# Patient Record
Sex: Female | Born: 1935
Health system: Southern US, Community
[De-identification: ages and names within clinical notes are randomized; demographics above are authoritative.]

## PROBLEM LIST (undated history)

## (undated) DIAGNOSIS — I110 Hypertensive heart disease with heart failure: Secondary | ICD-10-CM

## (undated) DIAGNOSIS — I4729 Other ventricular tachycardia: Secondary | ICD-10-CM

## (undated) DIAGNOSIS — I428 Other cardiomyopathies: Secondary | ICD-10-CM

## (undated) DIAGNOSIS — J45909 Unspecified asthma, uncomplicated: Secondary | ICD-10-CM

## (undated) DIAGNOSIS — I10 Essential (primary) hypertension: Secondary | ICD-10-CM

## (undated) DIAGNOSIS — E7800 Pure hypercholesterolemia, unspecified: Secondary | ICD-10-CM

## (undated) DIAGNOSIS — I472 Ventricular tachycardia: Secondary | ICD-10-CM

## (undated) DIAGNOSIS — M810 Age-related osteoporosis without current pathological fracture: Secondary | ICD-10-CM

## (undated) DIAGNOSIS — I5022 Chronic systolic (congestive) heart failure: Secondary | ICD-10-CM

## (undated) DIAGNOSIS — M1990 Unspecified osteoarthritis, unspecified site: Secondary | ICD-10-CM

## (undated) DIAGNOSIS — C189 Malignant neoplasm of colon, unspecified: Secondary | ICD-10-CM

## (undated) HISTORY — DX: Ventricular tachycardia: I47.2

## (undated) HISTORY — PX: ABDOMINAL HYSTERECTOMY: SHX81

## (undated) HISTORY — DX: Unspecified asthma, uncomplicated: J45.909

## (undated) HISTORY — DX: Age-related osteoporosis without current pathological fracture: M81.0

## (undated) HISTORY — DX: Essential (primary) hypertension: I10

## (undated) HISTORY — PX: BREAST BIOPSY: SHX20

## (undated) HISTORY — DX: Unspecified osteoarthritis, unspecified site: M19.90

## (undated) HISTORY — DX: Other ventricular tachycardia: I47.29

## (undated) HISTORY — PX: TUBAL LIGATION: SHX77

## (undated) HISTORY — DX: Pure hypercholesterolemia, unspecified: E78.00

---
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
## 2003-01-26 DIAGNOSIS — C189 Malignant neoplasm of colon, unspecified: Secondary | ICD-10-CM

## 2003-01-26 HISTORY — DX: Malignant neoplasm of colon, unspecified: C18.9

## 2003-11-29 ENCOUNTER — Ambulatory Visit: Payer: Self-pay

## 2003-12-03 ENCOUNTER — Ambulatory Visit: Payer: Self-pay | Admitting: Gastroenterology

## 2003-12-23 ENCOUNTER — Inpatient Hospital Stay: Payer: Self-pay | Admitting: Surgery

## 2004-01-13 ENCOUNTER — Ambulatory Visit: Payer: Self-pay

## 2004-06-01 ENCOUNTER — Ambulatory Visit: Payer: Self-pay

## 2004-11-09 ENCOUNTER — Ambulatory Visit: Payer: Self-pay | Admitting: Internal Medicine

## 2004-12-09 ENCOUNTER — Ambulatory Visit: Payer: Self-pay | Admitting: Internal Medicine

## 2006-01-06 ENCOUNTER — Ambulatory Visit: Payer: Self-pay | Admitting: Internal Medicine

## 2007-01-25 ENCOUNTER — Ambulatory Visit: Payer: Self-pay | Admitting: Internal Medicine

## 2007-02-01 ENCOUNTER — Ambulatory Visit: Payer: Self-pay | Admitting: Internal Medicine

## 2007-02-20 ENCOUNTER — Ambulatory Visit: Payer: Self-pay | Admitting: Gastroenterology

## 2007-02-20 LAB — HM COLONOSCOPY

## 2008-02-08 ENCOUNTER — Ambulatory Visit: Payer: Self-pay | Admitting: Internal Medicine

## 2008-12-25 ENCOUNTER — Ambulatory Visit: Payer: Self-pay | Admitting: Oncology

## 2009-01-16 ENCOUNTER — Ambulatory Visit: Payer: Self-pay | Admitting: Oncology

## 2009-01-25 ENCOUNTER — Ambulatory Visit: Payer: Self-pay | Admitting: Oncology

## 2009-02-10 ENCOUNTER — Ambulatory Visit: Payer: Self-pay | Admitting: Internal Medicine

## 2009-02-25 ENCOUNTER — Ambulatory Visit: Payer: Self-pay | Admitting: Oncology

## 2009-02-27 ENCOUNTER — Ambulatory Visit: Payer: Self-pay | Admitting: Oncology

## 2009-03-25 ENCOUNTER — Ambulatory Visit: Payer: Self-pay | Admitting: Oncology

## 2009-08-25 ENCOUNTER — Ambulatory Visit: Payer: Self-pay | Admitting: Oncology

## 2009-08-27 ENCOUNTER — Ambulatory Visit: Payer: Self-pay | Admitting: Oncology

## 2009-09-25 ENCOUNTER — Ambulatory Visit: Payer: Self-pay | Admitting: Oncology

## 2010-02-13 ENCOUNTER — Ambulatory Visit: Payer: Self-pay | Admitting: Internal Medicine

## 2010-04-02 ENCOUNTER — Ambulatory Visit: Payer: Self-pay | Admitting: Oncology

## 2010-04-26 ENCOUNTER — Ambulatory Visit: Payer: Self-pay | Admitting: Oncology

## 2010-10-19 ENCOUNTER — Ambulatory Visit: Payer: Self-pay | Admitting: Oncology

## 2010-10-26 ENCOUNTER — Ambulatory Visit: Payer: Self-pay | Admitting: Oncology

## 2011-03-26 ENCOUNTER — Ambulatory Visit: Payer: Self-pay | Admitting: Internal Medicine

## 2011-03-26 LAB — HM MAMMOGRAPHY

## 2011-04-19 ENCOUNTER — Ambulatory Visit: Payer: Self-pay | Admitting: Oncology

## 2011-04-19 LAB — COMPREHENSIVE METABOLIC PANEL
BUN: 15 mg/dL (ref 7–18)
Bilirubin,Total: 0.4 mg/dL (ref 0.2–1.0)
Calcium, Total: 8.7 mg/dL (ref 8.5–10.1)
Chloride: 102 mmol/L (ref 98–107)
Co2: 30 mmol/L (ref 21–32)
Creatinine: 0.9 mg/dL (ref 0.60–1.30)
EGFR (African American): >60
Glucose: 144 mg/dL — ABNORMAL HIGH (ref 65–99)
Potassium: 3.8 mmol/L (ref 3.5–5.1)
SGOT(AST): 20 U/L (ref 15–37)
SGPT (ALT): 24 U/L
Sodium: 140 mmol/L (ref 136–145)

## 2011-04-19 LAB — CBC CANCER CENTER
Basophil #: 0 x10 3/mm (ref 0.0–0.1)
Basophil %: 0.5 %
Eosinophil #: 0 x10 3/mm (ref 0.0–0.7)
Lymphocyte #: 1.7 x10 3/mm (ref 1.0–3.6)
Lymphocyte %: 41.2 %
MCH: 30.4 pg (ref 26.0–34.0)
MCHC: 34.1 g/dL (ref 32.0–36.0)
MCV: 89 fL (ref 80–100)
Monocyte %: 12.9 %
Neutrophil #: 1.9 x10 3/mm (ref 1.4–6.5)
Platelet: 219 x10 3/mm (ref 150–440)
RBC: 4.52 10*6/uL (ref 3.80–5.20)

## 2011-04-26 ENCOUNTER — Ambulatory Visit: Payer: Self-pay | Admitting: Oncology

## 2011-06-07 LAB — HEPATIC FUNCTION PANEL
Alkaline Phosphatase: 70 U/L (ref 25–125)
Bilirubin, Total: 0.5 mg/dL

## 2011-06-07 LAB — BASIC METABOLIC PANEL
BUN: 21 mg/dL (ref 4–21)
Creatinine: 0.7 mg/dL (ref <=1.1)
Glucose: 90 mg/dL

## 2012-01-05 ENCOUNTER — Other Ambulatory Visit: Payer: Self-pay | Admitting: *Deleted

## 2012-01-05 NOTE — Telephone Encounter (Signed)
Patient concerned about not having refils on her macardis 80mg  and

## 2012-01-24 ENCOUNTER — Ambulatory Visit: Payer: Self-pay | Admitting: Ophthalmology

## 2012-01-24 DIAGNOSIS — I10 Essential (primary) hypertension: Secondary | ICD-10-CM

## 2012-01-31 ENCOUNTER — Ambulatory Visit: Payer: Self-pay | Admitting: Ophthalmology

## 2012-02-07 ENCOUNTER — Other Ambulatory Visit: Payer: Self-pay | Admitting: Internal Medicine

## 2012-02-07 MED ORDER — TELMISARTAN 80 MG PO TABS: 80.0000 mg | ORAL_TABLET | Freq: Every day | ORAL | Status: DC

## 2012-02-07 MED ORDER — AMLODIPINE BESYLATE 10 MG PO TABS: 10.0000 mg | ORAL_TABLET | Freq: Every day | ORAL | Status: DC

## 2012-02-07 NOTE — Telephone Encounter (Signed)
Sent in to pharmacy.  

## 2012-02-07 NOTE — Telephone Encounter (Addendum)
Amlodipine Besylate 10 mg  # 30  Micardis 80 mg  # 30

## 2012-02-21 ENCOUNTER — Encounter: Payer: Self-pay | Admitting: *Deleted

## 2012-02-21 DIAGNOSIS — C189 Malignant neoplasm of colon, unspecified: Secondary | ICD-10-CM

## 2012-02-22 ENCOUNTER — Encounter: Payer: Self-pay | Admitting: Internal Medicine

## 2012-02-22 ENCOUNTER — Ambulatory Visit (INDEPENDENT_AMBULATORY_CARE_PROVIDER_SITE_OTHER): Payer: Medicare Other | Admitting: Internal Medicine

## 2012-02-22 VITALS — BP 130/80 | HR 79 | Temp 97.8°F | Ht 64.0 in | Wt 148.8 lb

## 2012-02-22 DIAGNOSIS — E7800 Pure hypercholesterolemia, unspecified: Secondary | ICD-10-CM

## 2012-02-22 DIAGNOSIS — C189 Malignant neoplasm of colon, unspecified: Secondary | ICD-10-CM

## 2012-02-22 DIAGNOSIS — R5381 Other malaise: Secondary | ICD-10-CM

## 2012-02-22 DIAGNOSIS — J45909 Unspecified asthma, uncomplicated: Secondary | ICD-10-CM

## 2012-02-22 DIAGNOSIS — R5383 Other fatigue: Secondary | ICD-10-CM

## 2012-02-22 DIAGNOSIS — D72819 Decreased white blood cell count, unspecified: Secondary | ICD-10-CM

## 2012-02-22 DIAGNOSIS — I10 Essential (primary) hypertension: Secondary | ICD-10-CM

## 2012-02-22 DIAGNOSIS — Z139 Encounter for screening, unspecified: Secondary | ICD-10-CM

## 2012-02-23 ENCOUNTER — Encounter: Payer: Self-pay | Admitting: Internal Medicine

## 2012-02-23 DIAGNOSIS — J45909 Unspecified asthma, uncomplicated: Secondary | ICD-10-CM | POA: Insufficient documentation

## 2012-02-23 DIAGNOSIS — Z85038 Personal history of other malignant neoplasm of large intestine: Secondary | ICD-10-CM | POA: Insufficient documentation

## 2012-02-23 DIAGNOSIS — J4489 Other specified chronic obstructive pulmonary disease: Secondary | ICD-10-CM | POA: Insufficient documentation

## 2012-02-23 DIAGNOSIS — D72819 Decreased white blood cell count, unspecified: Secondary | ICD-10-CM | POA: Insufficient documentation

## 2012-02-23 NOTE — Assessment & Plan Note (Signed)
Low cholesterol diet and exercise.  Check lipid profile.   

## 2012-02-23 NOTE — Assessment & Plan Note (Signed)
Last DLCO check was normal.  Breathing is stable.  CT scan 01/25/07 revealed stable probable probable area of fibrosis in the right lung apex.  Stable hepatic cyst.  Minimal area of fibrosis or atelectasis.  She is currently asymptomatic.

## 2012-02-23 NOTE — Assessment & Plan Note (Signed)
Currently asymptomatic.  Followed by Dr Doylene Canning.  Last colonoscopy 02/20/07 revealed diverticulosis and a 7mm hyperplastic polyp.  Recommended follow up colonoscopy 01/2012.  Will refer to GI for follow up colonoscopy.

## 2012-02-23 NOTE — Progress Notes (Signed)
  Subjective:    [name], female    DOB: 05/22/1928, 79 y.o.   MRN: [id number]  HPI 79 year old female with past history of hypertension, hypercholesterolemia and known asthma.  She comes in today to follow up on these issues as well as for a complete physical exam.  States she is doing well.  Breathing stable.  No chest pain or tightness.  Eating and drinking well.  Had right eye cataract surgery 01/31/12.  Did well.  Planning to have left cataract surgery - 02/28/12.  Bowels stable.    Past Medical History  Diagnosis Date  . Hypercholesterolemia   . Hypertension   . Osteoporosis     Current Outpatient Prescriptions on File Prior to Visit  Medication Sig Dispense Refill  . amLODipine (NORVASC) 10 MG tablet Take 1 tablet (10 mg total) by mouth daily.  30 tablet  0  . aspirin (ASPIRIN EC) 81 MG EC tablet Take 81 mg by mouth daily. Swallow whole.      . Cholecalciferol (VITAMIN D3) 10000 UNITS capsule Take 10,000 Units by mouth 2 (two) times daily.       Marland Kitchen telmisartan (MICARDIS) 80 MG tablet Take 1 tablet (80 mg total) by mouth daily.  30 tablet  0    Review of Systems Patient denies any headache, lightheadedness or dizziness.  No sinus or allergy symptoms.  No chest pain, tightness or palpitations.  No increased shortness of breath, cough or congestion.  No nausea or vomiting.  No acid reflux.  No abdominal pain or cramping.  No bowel change, such as diarrhea, constipation, BRBPR or melana.  No urine change.  Doing well from her eye surgery.  Did report some fatigue.  Overall, she feel she is doing well.      Objective:   Physical Exam Filed Vitals:   02/22/12 0936  BP: 130/80  Pulse: 79  Temp: 97.8 F (30.77 C)   79 year old female in no acute distress.   HEENT:  Nares- clear.  Oropharynx - without lesions. NECK:  Supple.  Nontender.  No audible bruit.  HEART:  Appears to be regular. LUNGS:  No crackles or wheezing audible.  Respirations even and unlabored.  RADIAL  PULSE:  Equal bilaterally.    BREASTS:  No nipple discharge or nipple retraction present.  Could not appreciate any distinct nodules or axillary adenopathy.  ABDOMEN:  Soft, nontender.  Bowel sounds present and normal.  No audible abdominal bruit.  GU:  Normal external genitalia.  Vaginal vault without lesions.  Cervix identified.  Pap performed. Could not appreciate any adnexal masses or tenderness.   RECTAL:  Heme negative.   EXTREMITIES:  No increased edema present.  DP pulses palpable and equal bilaterally.           Assessment & Plan:  OPTHALMOLOGY.  S/p cataract surgery - right eye.  Planning for left eye cataract extraction 02/28/12.    FATIGUE.  Check cbc, met c and tsh.    HEALTH MAINTENANCE.  Physical today.  S/p hysterectomy.  Does not require yearly pap smears.  Last colonoscopy 02/20/07 - diverticulosis and a 7mm hyperplastic polyp.  Recommended follow up colonoscopy 01/2012.   Last mammogram 03/26/11 - BiRADS II.

## 2012-02-23 NOTE — Assessment & Plan Note (Signed)
Blood pressure - under good control.  Check metabolic panel.  Same medication regimen.   

## 2012-02-23 NOTE — Assessment & Plan Note (Signed)
Recheck cbc to confirm stable.  

## 2012-02-28 ENCOUNTER — Ambulatory Visit: Payer: Self-pay | Admitting: Ophthalmology

## 2012-03-08 ENCOUNTER — Other Ambulatory Visit (INDEPENDENT_AMBULATORY_CARE_PROVIDER_SITE_OTHER): Payer: Medicare Other

## 2012-03-08 DIAGNOSIS — I10 Essential (primary) hypertension: Secondary | ICD-10-CM

## 2012-03-08 DIAGNOSIS — R5381 Other malaise: Secondary | ICD-10-CM

## 2012-03-08 DIAGNOSIS — R5383 Other fatigue: Secondary | ICD-10-CM

## 2012-03-08 LAB — LIPID PANEL
HDL: 91.2 mg/dL (ref >39.00)
Total CHOL/HDL Ratio: 3
Triglycerides: 59 mg/dL (ref 0.0–149.0)

## 2012-03-08 LAB — CBC WITH DIFFERENTIAL/PLATELET
Basophils Absolute: 0 10*3/uL (ref 0.0–0.1)
Eosinophils Absolute: 0 10*3/uL (ref 0.0–0.7)
HCT: 42.5 % (ref 36.0–46.0)
Hemoglobin: 14.3 g/dL (ref 12.0–15.0)
Lymphocytes Relative: 34.5 % (ref 12.0–46.0)
Lymphs Abs: 1.6 10*3/uL (ref 0.7–4.0)
MCHC: 33.5 g/dL (ref 30.0–36.0)
Neutro Abs: 2.6 10*3/uL (ref 1.4–7.7)
RDW: 13.9 % (ref 11.5–14.6)

## 2012-03-08 LAB — COMPREHENSIVE METABOLIC PANEL
ALT: 17 U/L (ref 0–35)
AST: 20 U/L (ref 0–37)
Alkaline Phosphatase: 69 U/L (ref 39–117)
Creatinine, Ser: 0.8 mg/dL (ref 0.4–1.2)
Total Bilirubin: 0.8 mg/dL (ref 0.3–1.2)

## 2012-03-08 LAB — LDL CHOLESTEROL, DIRECT: Direct LDL: 124.3 mg/dL

## 2012-03-09 ENCOUNTER — Encounter: Payer: Self-pay | Admitting: Internal Medicine

## 2012-03-25 ENCOUNTER — Ambulatory Visit: Payer: Self-pay | Admitting: Oncology

## 2012-04-12 ENCOUNTER — Other Ambulatory Visit: Payer: Self-pay | Admitting: *Deleted

## 2012-04-12 MED ORDER — AMLODIPINE BESYLATE 10 MG PO TABS: 10.0000 mg | ORAL_TABLET | Freq: Every day | ORAL | Status: DC

## 2012-04-12 NOTE — Telephone Encounter (Signed)
Sent in to pharmacy.  

## 2012-04-13 ENCOUNTER — Ambulatory Visit: Payer: Self-pay | Admitting: Internal Medicine

## 2012-04-18 LAB — COMPREHENSIVE METABOLIC PANEL
Albumin: 4 g/dL (ref 3.4–5.0)
Alkaline Phosphatase: 90 U/L (ref 50–136)
Bilirubin,Total: 0.3 mg/dL (ref 0.2–1.0)
EGFR (Non-African Amer.): >60
Glucose: 129 mg/dL — ABNORMAL HIGH (ref 65–99)
Osmolality: 280 (ref 275–301)
Potassium: 4.1 mmol/L (ref 3.5–5.1)
SGOT(AST): 16 U/L (ref 15–37)

## 2012-04-18 LAB — CBC CANCER CENTER
Basophil %: 0.5 %
HCT: 41.9 % (ref 35.0–47.0)
Lymphocyte #: 1.5 x10 3/mm (ref 1.0–3.6)
MCV: 89 fL (ref 80–100)
Monocyte #: 0.4 x10 3/mm (ref 0.2–0.9)
Monocyte %: 11 %
WBC: 3.9 x10 3/mm (ref 3.6–11.0)

## 2012-04-19 LAB — CEA: CEA: 5 ng/mL — ABNORMAL HIGH (ref 0.0–4.7)

## 2012-04-25 ENCOUNTER — Ambulatory Visit: Payer: Self-pay | Admitting: Oncology

## 2012-05-02 ENCOUNTER — Encounter: Payer: Self-pay | Admitting: Internal Medicine

## 2012-06-08 ENCOUNTER — Ambulatory Visit: Payer: Self-pay | Admitting: Gastroenterology

## 2012-06-22 ENCOUNTER — Ambulatory Visit (INDEPENDENT_AMBULATORY_CARE_PROVIDER_SITE_OTHER): Payer: Medicare Other | Admitting: Internal Medicine

## 2012-06-22 ENCOUNTER — Encounter: Payer: Self-pay | Admitting: Internal Medicine

## 2012-06-22 VITALS — BP 120/80 | HR 84 | Temp 98.2°F | Ht 64.0 in | Wt 143.5 lb

## 2012-06-22 DIAGNOSIS — E7800 Pure hypercholesterolemia, unspecified: Secondary | ICD-10-CM

## 2012-06-22 DIAGNOSIS — C189 Malignant neoplasm of colon, unspecified: Secondary | ICD-10-CM

## 2012-06-22 DIAGNOSIS — D72819 Decreased white blood cell count, unspecified: Secondary | ICD-10-CM

## 2012-06-22 DIAGNOSIS — I10 Essential (primary) hypertension: Secondary | ICD-10-CM

## 2012-06-22 DIAGNOSIS — R7309 Other abnormal glucose: Secondary | ICD-10-CM

## 2012-06-22 DIAGNOSIS — H6192 Disorder of left external ear, unspecified: Secondary | ICD-10-CM

## 2012-06-22 DIAGNOSIS — H6190 Disorder of external ear, unspecified, unspecified ear: Secondary | ICD-10-CM

## 2012-06-22 DIAGNOSIS — R634 Abnormal weight loss: Secondary | ICD-10-CM

## 2012-06-22 DIAGNOSIS — R739 Hyperglycemia, unspecified: Secondary | ICD-10-CM

## 2012-06-25 ENCOUNTER — Encounter: Payer: Self-pay | Admitting: Internal Medicine

## 2012-06-25 NOTE — Progress Notes (Signed)
  Subjective:    [name], female    DOB: [birth year], 79 y.o.   MRN: [id number]  HPI 79 year old female with past history of hypertension, hypercholesterolemia and known asthma.  She comes in today for a scheduled follow up.  States she is doing well.  Breathing stable.  No chest pain or tightness.  Eating and drinking well.  Had a recent colonoscopy (06/08/12).  States everything checked out fine.  Was told she had diverticulosis.  Bowels doing well.  She has lost some weight.  She states initially cut back.  She also states she does not eat as much since she retired.  Will follow her weight.  She was 156 pounds in 2012 and 146 pounds last visit.      Past Medical History  Diagnosis Date  . Hypercholesterolemia   . Hypertension   . Osteoporosis     Current Outpatient Prescriptions on File Prior to Visit  Medication Sig Dispense Refill  . amLODipine (NORVASC) 10 MG tablet Take 1 tablet (10 mg total) by mouth daily.  30 tablet  5  . aspirin (ASPIRIN EC) 81 MG EC tablet Take 81 mg by mouth daily. Swallow whole.      . Cholecalciferol (VITAMIN D3) 10000 UNITS capsule Take 10,000 Units by mouth 2 (two) times daily.       Marland Kitchen telmisartan (MICARDIS) 80 MG tablet Take 1 tablet (80 mg total) by mouth daily.  30 tablet  0   No current facility-administered medications on file prior to visit.    Review of Systems Patient denies any headache, lightheadedness or dizziness.  No sinus or allergy symptoms.  No chest pain, tightness or palpitations.  No increased shortness of breath, cough or congestion.  No nausea or vomiting.  No acid reflux.  No abdominal pain or cramping.  No bowel change, such as diarrhea, constipation, BRBPR or melana.  No urine change.  Overall, she feel she is doing well.  Some weight loss as outlined.  Does report having a good appetite.       Objective:   Physical Exam  Filed Vitals:   06/22/12 0924  BP: 120/80  Pulse: 84  Temp: 98.2 F (30.80 C)   79  year old female in no acute distress.   HEENT:  Nares- clear.  Oropharynx - without lesions. NECK:  Supple.  Nontender.  No audible bruit.  HEART:  Appears to be regular. LUNGS:  No crackles or wheezing audible.  Respirations even and unlabored.  RADIAL PULSE:  Equal bilaterally.  ABDOMEN:  Soft, nontender.  Bowel sounds present and normal.  No audible abdominal bruit.  EXTREMITIES:  No increased edema present.  DP pulses palpable and equal bilaterally.           Assessment & Plan:  OPTHALMOLOGY.  S/p cataract surgery.  Following with opthalmology.    DERMATOLOGY.  Persistent ear lesion.  Has seen Dr Gwen Pounds.  Refer back for evaluation and possible removal.   HEALTH MAINTENANCE.  Physical 02/22/12.   S/p hysterectomy.  Does not require yearly pap smears.  Colonoscopy 02/20/07 - diverticulosis and a 7mm hyperplastic polyp.  Recommended follow up colonoscopy 01/2012.   Pt reported a follow up colonoscopy 06/08/12 - ok.  Last mammogram 04/13/12 - BiRADS II.

## 2012-06-25 NOTE — Assessment & Plan Note (Signed)
Low cholesterol diet and exercise.  Follow lipid profile.    

## 2012-06-25 NOTE — Assessment & Plan Note (Signed)
Currently asymptomatic.  Followed by Dr Doylene Canning.  Colonoscopy 02/20/07 revealed diverticulosis and a 7mm hyperplastic polyp.  Recommended follow up colonoscopy 01/2012.  Per pt, colonoscopy 06/08/12 revealed diverticulosis.  States was ok and was told did not need another colonoscopy.  Obtain records.

## 2012-06-25 NOTE — Assessment & Plan Note (Signed)
Blood pressure under good control.  Follow metabolic panel.  Same medication regimen.   

## 2012-06-25 NOTE — Assessment & Plan Note (Signed)
Follow cbc to confirm stable.  

## 2012-06-25 NOTE — Assessment & Plan Note (Signed)
Last DLCO check was normal.  Breathing is stable.  CT scan 01/25/07 revealed stable probable probable area of fibrosis in the right lung apex.  Stable hepatic cyst.  Minimal area of fibrosis or atelectasis.  She is currently asymptomatic.

## 2012-06-27 ENCOUNTER — Encounter: Payer: Self-pay | Admitting: Internal Medicine

## 2012-07-12 ENCOUNTER — Encounter: Payer: Self-pay | Admitting: Internal Medicine

## 2012-07-13 ENCOUNTER — Encounter: Payer: Self-pay | Admitting: Internal Medicine

## 2012-07-14 ENCOUNTER — Other Ambulatory Visit (INDEPENDENT_AMBULATORY_CARE_PROVIDER_SITE_OTHER): Payer: Medicare Other

## 2012-07-14 DIAGNOSIS — I10 Essential (primary) hypertension: Secondary | ICD-10-CM

## 2012-07-14 DIAGNOSIS — C189 Malignant neoplasm of colon, unspecified: Secondary | ICD-10-CM

## 2012-07-14 DIAGNOSIS — E7800 Pure hypercholesterolemia, unspecified: Secondary | ICD-10-CM

## 2012-07-14 DIAGNOSIS — R634 Abnormal weight loss: Secondary | ICD-10-CM

## 2012-07-14 DIAGNOSIS — R7309 Other abnormal glucose: Secondary | ICD-10-CM

## 2012-07-14 DIAGNOSIS — R739 Hyperglycemia, unspecified: Secondary | ICD-10-CM

## 2012-07-14 LAB — BASIC METABOLIC PANEL
CO2: 30 mEq/L (ref 19–32)
Glucose, Bld: 111 mg/dL — ABNORMAL HIGH (ref 70–99)
Potassium: 4.5 mEq/L (ref 3.5–5.1)
Sodium: 140 mEq/L (ref 135–145)

## 2012-07-14 LAB — LIPID PANEL
HDL: 89.8 mg/dL (ref >39.00)
Triglycerides: 35 mg/dL (ref 0.0–149.0)
VLDL: 7 mg/dL (ref 0.0–40.0)

## 2012-07-14 LAB — HEPATIC FUNCTION PANEL
ALT: 15 U/L (ref 0–35)
Total Bilirubin: 0.6 mg/dL (ref 0.3–1.2)
Total Protein: 7.2 g/dL (ref 6.0–8.3)

## 2012-07-14 LAB — TSH: TSH: 0.68 u[IU]/mL (ref 0.35–5.50)

## 2012-07-14 LAB — LDL CHOLESTEROL, DIRECT: Direct LDL: 122.9 mg/dL

## 2012-07-19 ENCOUNTER — Encounter: Payer: Self-pay | Admitting: *Deleted

## 2012-08-08 ENCOUNTER — Encounter: Payer: Self-pay | Admitting: Internal Medicine

## 2012-08-09 ENCOUNTER — Encounter: Payer: Self-pay | Admitting: Internal Medicine

## 2012-08-18 ENCOUNTER — Ambulatory Visit: Payer: Medicare Other | Admitting: Internal Medicine

## 2012-09-05 ENCOUNTER — Ambulatory Visit: Payer: Medicare Other | Admitting: Internal Medicine

## 2012-09-28 ENCOUNTER — Ambulatory Visit (INDEPENDENT_AMBULATORY_CARE_PROVIDER_SITE_OTHER): Payer: Medicare Other | Admitting: Internal Medicine

## 2012-09-28 ENCOUNTER — Encounter: Payer: Self-pay | Admitting: Internal Medicine

## 2012-09-28 VITALS — BP 130/80 | HR 85 | Temp 97.6°F | Ht 64.0 in | Wt 144.5 lb

## 2012-09-28 DIAGNOSIS — R7309 Other abnormal glucose: Secondary | ICD-10-CM

## 2012-09-28 DIAGNOSIS — C189 Malignant neoplasm of colon, unspecified: Secondary | ICD-10-CM

## 2012-09-28 DIAGNOSIS — I10 Essential (primary) hypertension: Secondary | ICD-10-CM

## 2012-09-28 DIAGNOSIS — E7800 Pure hypercholesterolemia, unspecified: Secondary | ICD-10-CM

## 2012-09-28 DIAGNOSIS — D72819 Decreased white blood cell count, unspecified: Secondary | ICD-10-CM

## 2012-09-28 DIAGNOSIS — J45909 Unspecified asthma, uncomplicated: Secondary | ICD-10-CM

## 2012-09-28 DIAGNOSIS — R739 Hyperglycemia, unspecified: Secondary | ICD-10-CM

## 2012-09-28 NOTE — Progress Notes (Signed)
Subjective:    [name], female    DOB: 1928/02/12, 79 y.o.   MRN: [id number]  HPI 79 year old female with past history of hypertension, hypercholesterolemia and known asthma.  She comes in today for a scheduled follow up.  States she is doing well.  Breathing stable.  No chest pain or tightness.  Eating and drinking well.  Had a recent colonoscopy (06/08/12).  States everything checked out fine.  Was told she had diverticulosis.  Bowels doing well.  She had lost weight.  She states initially cut back.  She also stated she does not eat as much since she retired.  Weight stable from last check.  She does report noticing this am - her ears being stopped up and some increased drainage.  No chest congestion.  No fever.  No sore throat.      Past Medical History  Diagnosis Date  . Hypercholesterolemia   . Hypertension   . Osteoporosis     Current Outpatient Prescriptions on File Prior to Visit  Medication Sig Dispense Refill  . amLODipine (NORVASC) 10 MG tablet Take 1 tablet (10 mg total) by mouth daily.  30 tablet  5  . aspirin (ASPIRIN EC) 81 MG EC tablet Take 81 mg by mouth daily. Swallow whole.      . Cholecalciferol (VITAMIN D3) 10000 UNITS capsule Take 10,000 Units by mouth 2 (two) times daily.       Marland Kitchen telmisartan (MICARDIS) 80 MG tablet Take 1 tablet (80 mg total) by mouth daily.  30 tablet  0   No current facility-administered medications on file prior to visit.    Review of Systems Patient denies any headache, lightheadedness or dizziness.  Does report some increased drainage and ears feeling stopped up.  Started this am.  No chest pain, tightness or palpitations.  No increased shortness of breath, cough or congestion.  No chest congestion.  No feverl.  No nausea or vomiting.  No acid reflux.  No abdominal pain or cramping.  No bowel change, such as diarrhea, constipation, BRBPR or melana.  No urine change.  Overall, she feel she is doing well.  Does report having a  good appetite.  Weight stable from last check.       Objective:   Physical Exam  Filed Vitals:   09/28/12 0934  BP: 130/80  Pulse: 85  Temp: 97.6 F (36.4 C)   Blood pressure recheck:  122/78, pulse 28  79 year old female in no acute distress.   HEENT:  Nares- clear.  Oropharynx - without lesions. NECK:  Supple.  Nontender.  No audible bruit.  HEART:  Appears to be regular. LUNGS:  No crackles or wheezing audible.  Respirations even and unlabored.  RADIAL PULSE:  Equal bilaterally.  ABDOMEN:  Soft, nontender.  Bowel sounds present and normal.  No audible abdominal bruit.  EXTREMITIES:  No increased edema present.  DP pulses palpable and equal bilaterally.           Assessment & Plan:  ALLERGIES.  Symptoms as outlined.  No evidence of infection.  Treat with allegra as outlined.  Saline nasal spray as directed.  Follow.    OPTHALMOLOGY.  S/p cataract surgery.  Following with opthalmology.    DERMATOLOGY.  Had a persistent ear lesion.  S/p removal by Dr Gwen Pounds.     HEALTH MAINTENANCE.  Physical 02/22/12.   S/p hysterectomy.  Does not require yearly pap smears.  Colonoscopy 02/20/07 - diverticulosis and a 7mm  hyperplastic polyp.  Recommended follow up colonoscopy 01/2012.   Pt reported a follow up colonoscopy 06/08/12 - ok.  Last mammogram 04/13/12 - BiRADS II.

## 2012-10-01 ENCOUNTER — Encounter: Payer: Self-pay | Admitting: Internal Medicine

## 2012-10-01 NOTE — Assessment & Plan Note (Signed)
Last DLCO check was normal.  Breathing is stable.  CT scan 01/25/07 revealed stable probable probable area of fibrosis in the right lung apex.  Stable hepatic cyst.  Minimal area of fibrosis or atelectasis.  She is currently asymptomatic.

## 2012-10-01 NOTE — Assessment & Plan Note (Addendum)
Follow cbc to confirm stable.  

## 2012-10-01 NOTE — Assessment & Plan Note (Addendum)
Blood pressure under good control.  Follow metabolic panel.  Same medication regimen.   

## 2012-10-01 NOTE — Assessment & Plan Note (Signed)
Currently asymptomatic.  Followed by Dr Choksi.  Colonoscopy 02/20/07 revealed diverticulosis and a 7mm hyperplastic polyp.  Recommended follow up colonoscopy 01/2012.  Per pt, colonoscopy 06/08/12 revealed diverticulosis.  States was ok and was told did not need another colonoscopy.   

## 2012-10-01 NOTE — Assessment & Plan Note (Signed)
Low cholesterol diet and exercise.  Follow lipid profile.    

## 2012-11-16 ENCOUNTER — Other Ambulatory Visit: Payer: Self-pay | Admitting: *Deleted

## 2012-11-16 MED ORDER — AMLODIPINE BESYLATE 10 MG PO TABS: 10.0000 mg | ORAL_TABLET | Freq: Every day | ORAL | Status: DC

## 2012-11-16 MED ORDER — TELMISARTAN 80 MG PO TABS: 80.0000 mg | ORAL_TABLET | Freq: Every day | ORAL | Status: DC

## 2013-02-01 ENCOUNTER — Encounter: Payer: Self-pay | Admitting: Internal Medicine

## 2013-02-01 ENCOUNTER — Ambulatory Visit (INDEPENDENT_AMBULATORY_CARE_PROVIDER_SITE_OTHER): Payer: Medicare Other | Admitting: Internal Medicine

## 2013-02-01 VITALS — BP 130/80 | HR 82 | Temp 98.0°F | Ht 64.0 in | Wt 144.0 lb

## 2013-02-01 DIAGNOSIS — C189 Malignant neoplasm of colon, unspecified: Secondary | ICD-10-CM

## 2013-02-01 DIAGNOSIS — D72819 Decreased white blood cell count, unspecified: Secondary | ICD-10-CM

## 2013-02-01 DIAGNOSIS — Z1239 Encounter for other screening for malignant neoplasm of breast: Secondary | ICD-10-CM

## 2013-02-01 DIAGNOSIS — I10 Essential (primary) hypertension: Secondary | ICD-10-CM

## 2013-02-01 DIAGNOSIS — J45909 Unspecified asthma, uncomplicated: Secondary | ICD-10-CM

## 2013-02-01 DIAGNOSIS — E7800 Pure hypercholesterolemia, unspecified: Secondary | ICD-10-CM

## 2013-02-01 MED ORDER — FLUTICASONE PROPIONATE 50 MCG/ACT NA SUSP: 2.0000 | Freq: Every day | NASAL | Status: DC

## 2013-02-01 NOTE — Progress Notes (Signed)
Pre-visit discussion using our clinic review tool. No additional management support is needed unless otherwise documented below in the visit note.  

## 2013-02-01 NOTE — Assessment & Plan Note (Signed)
Blood pressure under good control.  Follow metabolic panel.  Same medication regimen.   

## 2013-02-01 NOTE — Progress Notes (Signed)
Subjective:    [name], female    DOB: 03-07-1928, 80 y.o.   MRN: [id number]  HPI 80 year old female with past history of hypertension, hypercholesterolemia and known asthma.  She comes in today to follow up on these issues as well as for a complete physical exam.  States she is doing well.  Breathing stable.  No chest pain or tightness.  No sob.  She is having some minimal nasal congestion and drainage.  Feels better.  Has not been persistent since her last visit.  Just flared last week.  Better this week.  Eating and drinking well.  Had a recent colonoscopy (06/08/12).  States everything checked out fine.  Was told she had diverticulosis.  Bowels doing well.  She had lost weight.  She states initially cut back.  Weight is stable from last check.  She also stated she does not eat as much since she retired.  Overall feels good.       Past Medical History  Diagnosis Date  . Hypercholesterolemia   . Hypertension   . Osteoporosis     Current Outpatient Prescriptions on File Prior to Visit  Medication Sig Dispense Refill  . amLODipine (NORVASC) 10 MG tablet Take 1 tablet (10 mg total) by mouth daily.  30 tablet  5  . aspirin (ASPIRIN EC) 81 MG EC tablet Take 81 mg by mouth daily. Swallow whole.      . Cholecalciferol (VITAMIN D3) 10000 UNITS capsule Take 20,000 Units by mouth daily.       Marland Kitchen telmisartan (MICARDIS) 80 MG tablet Take 1 tablet (80 mg total) by mouth daily.  30 tablet  5   No current facility-administered medications on file prior to visit.    Review of Systems Patient denies any headache, lightheadedness or dizziness.  Minimal nasal congestion and drainage.   No chest pain, tightness or palpitations.  No increased shortness of breath, cough or congestion.  No chest congestion.  No fever.  No nausea or vomiting.  No acid reflux.  No abdominal pain or cramping.  No bowel change, such as diarrhea, constipation, BRBPR or melana.  No urine change.  Overall, she feel  she is doing well.  Does report having a good appetite.  Weight stable from last check.  Overall feels good.      Objective:   Physical Exam  Filed Vitals:   02/01/13 0932  BP: 130/80  Pulse: 82  Temp: 98 F (36.7 C)   Blood pressure recheck:    128/78, pulse 63  80 year old female in no acute distress.   HEENT:  Nares- clear.  Oropharynx - without lesions. NECK:  Supple.  Nontender.  No audible bruit.  HEART:  Appears to be regular. LUNGS:  No crackles or wheezing audible.  Respirations even and unlabored.  RADIAL PULSE:  Equal bilaterally.    BREASTS:  No nipple discharge or nipple retraction present.  Could not appreciate any distinct nodules or axillary adenopathy.  ABDOMEN:  Soft, nontender.  Bowel sounds present and normal.  No audible abdominal bruit.  GU:  Not performed.     EXTREMITIES:  No increased edema present.  DP pulses palpable and equal bilaterally.          Assessment & Plan:  ALLERGIES.  Symptoms as outlined.  No evidence of infection.  Treat with allegra as outlined.  Saline nasal spray as directed.  Flonase as directed.  Follow.    OPTHALMOLOGY.  S/p cataract surgery.  Following with opthalmology.    DERMATOLOGY.  Had a persistent ear lesion.  S/p removal by Dr Nehemiah Massed.     HEALTH MAINTENANCE.  Physical today.   S/p hysterectomy.  Does not require yearly pap smears.  Colonoscopy 02/20/07 - diverticulosis and a 68mm hyperplastic polyp.  Recommended follow up colonoscopy 01/2012.   Pt reported a follow up colonoscopy 06/08/12 - ok - diverticulosis.   Last mammogram 04/13/12 - BiRADS II.  Schedule f/u mammogram when due.   Declined flu shot.  Discussed shingles vaccine.

## 2013-02-01 NOTE — Assessment & Plan Note (Signed)
Low cholesterol diet and exercise.  Follow lipid profile.    

## 2013-02-01 NOTE — Assessment & Plan Note (Signed)
Last DLCO check was normal.  Breathing is stable.  CT scan 01/25/07 revealed stable probable probable area of fibrosis in the right lung apex.  Stable hepatic cyst.  Minimal area of fibrosis or atelectasis.  She is currently asymptomatic.  Did not feel inhalers helped.  Follow.    

## 2013-02-01 NOTE — Assessment & Plan Note (Signed)
Follow cbc to confirm stable.  

## 2013-02-01 NOTE — Assessment & Plan Note (Signed)
Currently asymptomatic.  Followed by Dr Oliva Bustard.  Colonoscopy 02/20/07 revealed diverticulosis and a 37mm hyperplastic polyp.  Recommended follow up colonoscopy 01/2012.  Per pt, colonoscopy 06/08/12 revealed diverticulosis.  States was ok and was told did not need another colonoscopy.   Due to see colonoscopy this spring.

## 2013-02-08 ENCOUNTER — Other Ambulatory Visit (INDEPENDENT_AMBULATORY_CARE_PROVIDER_SITE_OTHER): Payer: Medicare Other

## 2013-02-08 DIAGNOSIS — E7800 Pure hypercholesterolemia, unspecified: Secondary | ICD-10-CM

## 2013-02-08 DIAGNOSIS — R739 Hyperglycemia, unspecified: Secondary | ICD-10-CM

## 2013-02-08 DIAGNOSIS — R7309 Other abnormal glucose: Secondary | ICD-10-CM

## 2013-02-08 DIAGNOSIS — I10 Essential (primary) hypertension: Secondary | ICD-10-CM

## 2013-02-08 LAB — LIPID PANEL
CHOL/HDL RATIO: 3
Cholesterol: 206 mg/dL — ABNORMAL HIGH (ref 0–200)
HDL: 73 mg/dL (ref >39.00)
Triglycerides: 31 mg/dL (ref 0.0–149.0)
VLDL: 6.2 mg/dL (ref 0.0–40.0)

## 2013-02-08 LAB — BASIC METABOLIC PANEL
BUN: 16 mg/dL (ref 6–23)
CHLORIDE: 103 meq/L (ref 96–112)
CO2: 30 mEq/L (ref 19–32)
CREATININE: 0.8 mg/dL (ref 0.4–1.2)
Calcium: 9.1 mg/dL (ref 8.4–10.5)
GFR: 88.12 mL/min (ref >60.00)
Glucose, Bld: 95 mg/dL (ref 70–99)
Potassium: 4.2 mEq/L (ref 3.5–5.1)
Sodium: 140 mEq/L (ref 135–145)

## 2013-02-08 LAB — HEPATIC FUNCTION PANEL
ALT: 12 U/L (ref 0–35)
AST: 18 U/L (ref 0–37)
Albumin: 3.8 g/dL (ref 3.5–5.2)
Alkaline Phosphatase: 61 U/L (ref 39–117)
BILIRUBIN TOTAL: 0.7 mg/dL (ref 0.3–1.2)
Bilirubin, Direct: 0.1 mg/dL (ref 0.0–0.3)
Total Protein: 7 g/dL (ref 6.0–8.3)

## 2013-02-08 LAB — HEMOGLOBIN A1C: HEMOGLOBIN A1C: 6.1 % (ref 4.6–6.5)

## 2013-02-08 LAB — LDL CHOLESTEROL, DIRECT: LDL DIRECT: 128.3 mg/dL

## 2013-02-09 ENCOUNTER — Encounter: Payer: Self-pay | Admitting: *Deleted

## 2013-04-16 ENCOUNTER — Ambulatory Visit: Payer: Self-pay | Admitting: Internal Medicine

## 2013-04-16 ENCOUNTER — Ambulatory Visit: Payer: Self-pay | Admitting: Oncology

## 2013-04-17 LAB — CBC CANCER CENTER
Basophil #: 0 x10 3/mm (ref 0.0–0.1)
Basophil %: 0.4 %
EOS PCT: 0.2 %
Eosinophil #: 0 x10 3/mm (ref 0.0–0.7)
HCT: 41.8 % (ref 35.0–47.0)
HGB: 13.5 g/dL (ref 12.0–16.0)
Lymphocyte #: 1.4 x10 3/mm (ref 1.0–3.6)
Lymphocyte %: 34.2 %
MCH: 29.3 pg (ref 26.0–34.0)
MCHC: 32.3 g/dL (ref 32.0–36.0)
MCV: 91 fL (ref 80–100)
Monocyte #: 0.5 x10 3/mm (ref 0.2–0.9)
Monocyte %: 11.2 %
NEUTROS PCT: 54 %
Neutrophil #: 2.2 x10 3/mm (ref 1.4–6.5)
Platelet: 217 x10 3/mm (ref 150–440)
RBC: 4.6 10*6/uL (ref 3.80–5.20)
RDW: 13.7 % (ref 11.5–14.5)
WBC: 4.2 x10 3/mm (ref 3.6–11.0)

## 2013-04-17 LAB — COMPREHENSIVE METABOLIC PANEL
ALK PHOS: 83 U/L
ANION GAP: 5 — AB (ref 7–16)
Albumin: 3.8 g/dL (ref 3.4–5.0)
BUN: 15 mg/dL (ref 7–18)
Bilirubin,Total: 0.4 mg/dL (ref 0.2–1.0)
CREATININE: 0.78 mg/dL (ref 0.60–1.30)
Calcium, Total: 9.2 mg/dL (ref 8.5–10.1)
Chloride: 102 mmol/L (ref 98–107)
Co2: 34 mmol/L — ABNORMAL HIGH (ref 21–32)
EGFR (Non-African Amer.): >60
GLUCOSE: 90 mg/dL (ref 65–99)
Osmolality: 282 (ref 275–301)
POTASSIUM: 4 mmol/L (ref 3.5–5.1)
SGOT(AST): 17 U/L (ref 15–37)
SGPT (ALT): 17 U/L (ref 12–78)
SODIUM: 141 mmol/L (ref 136–145)
Total Protein: 7.7 g/dL (ref 6.4–8.2)

## 2013-04-18 LAB — CEA: CEA: 4.5 ng/mL (ref 0.0–4.7)

## 2013-04-20 ENCOUNTER — Telehealth: Payer: Self-pay | Admitting: Emergency Medicine

## 2013-04-20 ENCOUNTER — Ambulatory Visit: Payer: Self-pay | Admitting: Internal Medicine

## 2013-04-20 NOTE — Telephone Encounter (Signed)
I prefer to have surgery evaluate this pt.  Needs mammo results.  Thanks.

## 2013-04-20 NOTE — Telephone Encounter (Signed)
Informed Kathy Dominguez via her voicemail of decision. Results requested.

## 2013-04-20 NOTE — Telephone Encounter (Signed)
Kathy Dominguez from [facility] called and stated radiologist has recommended a U/S core biopsy of the L breast on the patient for a L breat mass. Please advise if you want the pt to go to surgery or have it done there.

## 2013-04-25 ENCOUNTER — Ambulatory Visit: Payer: Self-pay | Admitting: Oncology

## 2013-05-14 ENCOUNTER — Other Ambulatory Visit: Payer: Self-pay | Admitting: Internal Medicine

## 2013-05-14 DIAGNOSIS — R928 Other abnormal and inconclusive findings on diagnostic imaging of breast: Secondary | ICD-10-CM

## 2013-05-14 NOTE — Progress Notes (Signed)
Called pt to check on biopsy.  States has not had biopsy.  Pt notified of mammo results and need for biopsy.  She was aware of mammo results.  Agrees to referral to Dr Bary Castilla.

## 2013-05-21 ENCOUNTER — Other Ambulatory Visit: Payer: Medicare Other

## 2013-05-21 ENCOUNTER — Encounter: Payer: Self-pay | Admitting: General Surgery

## 2013-05-21 ENCOUNTER — Ambulatory Visit (INDEPENDENT_AMBULATORY_CARE_PROVIDER_SITE_OTHER): Payer: Medicare Other | Admitting: General Surgery

## 2013-05-21 VITALS — BP 140/76 | HR 74 | Resp 14 | Ht 64.0 in | Wt 134.0 lb

## 2013-05-21 DIAGNOSIS — N63 Unspecified lump in unspecified breast: Secondary | ICD-10-CM | POA: Insufficient documentation

## 2013-05-21 DIAGNOSIS — N6009 Solitary cyst of unspecified breast: Secondary | ICD-10-CM

## 2013-05-21 NOTE — Patient Instructions (Signed)

## 2013-05-21 NOTE — Progress Notes (Signed)
[name], female   DOB: [birth year], 80 y.o.   MRN: [id number]  Chief Complaint  Patient presents with  . Other    mammogram    HPI Kathy Dominguez is a 80 y.o. female who presents for a breast evaluation. The most recent mammogram and left breast ultrasound.was done on 04/20/13. Patient does perform regular self breast checks and gets regular mammograms done. Daughter (Kathy Dominguez)  has breast cancer age 54, biopsied here, but treated at [facility]. She passed at age 28.  HPI  Past Medical History  Diagnosis Date  . Hypercholesterolemia   . Hypertension   . Osteoporosis     Past Surgical History  Procedure Laterality Date  . Thyroidectomy, partial  1956  . Abdominal hysterectomy    . Tubal ligation      Family History  Problem Relation Age of Onset  . Stroke Mother   . Colon cancer Father   . Breast cancer Daughter 80  . Breast cancer Cousin     Social History History  Substance Use Topics  . Smoking status: Never Smoker   . Smokeless tobacco: Never Used  . Alcohol Use: No    No Known Allergies  Current Outpatient Prescriptions  Medication Sig Dispense Refill  . amLODipine (NORVASC) 10 MG tablet Take 1 tablet (10 mg total) by mouth daily.  30 tablet  5  . aspirin (ASPIRIN EC) 81 MG EC tablet Take 81 mg by mouth daily. Swallow whole.      . Cholecalciferol (VITAMIN D3) 10000 UNITS capsule Take 20,000 Units by mouth daily.       . fluticasone (FLONASE) 50 MCG/ACT nasal spray Place 2 sprays into both nostrils daily.  16 g  2  . telmisartan (MICARDIS) 80 MG tablet Take 1 tablet (80 mg total) by mouth daily.  30 tablet  5   No current facility-administered medications for this visit.    Review of Systems Review of Systems  Constitutional: Negative.   Respiratory: Negative.  Negative for apnea.   Cardiovascular: Negative.     Blood pressure 140/76, pulse 74, resp. rate 14, height 5\' 4"  (1.626 m), weight 134 lb (60.782 kg), last menstrual  period 02/21/1966.  Physical Exam Physical Exam  Constitutional: She is oriented to person, place, and time. She appears well-developed and well-nourished.  Eyes: Conjunctivae are normal.  Neck: Neck supple.  Cardiovascular: Normal rate, regular rhythm and normal heart sounds.   Pulmonary/Chest: Breath sounds normal. Right breast exhibits no inverted nipple, no mass, no nipple discharge, no skin change and no tenderness. Left breast exhibits no inverted nipple, no mass, no nipple discharge, no skin change and no tenderness.  Abdominal: Soft. Bowel sounds are normal. There is no tenderness.  Lymphadenopathy:    She has no cervical adenopathy.    She has no axillary adenopathy.  Neurological: She is alert and oriented to person, place, and time.  Skin: Skin is warm and dry.    Data Reviewed Her screening mammogram dated April 16, 2013 showed dense tissue in the left breast a possible asymmetry for which additional views were requested. BI-RAD-zero.  Focal spot compression and ultrasound dated April 20, 2013 showed a faint 7 mm asymmetry on the posterior CC view but it was poorly visualized on the ML view.  Ultrasound suggested an 8 x 9 mm partially circumscribed hypoechoic mass at the 9 o'clock position of left breast 4 cm from the nipple. It was not felt to correlate with the mammographic abnormality. BI-RAD-4.  Ultrasound examination of the left breast in the 11 o'clock position 3 cm from the nipple showed a well-defined 0.3 x 7.51 x 1.1 cm antecolic structure resting just above the pectoralis fascia.   At the 9 o'clock position corresponding to the hospital study in, 3 cm from the nipple is 0.4 x 0.7 x 0.95 heterogeneous area with a peripheral hypoechoic area and a central hyperechoic area was identified.   The cyst at the 11:00 felt to correspond to the density in the central portion of the breast on the CC view. This resolved with aspiration.   The 9:00 lesion was approached making  use of a 14-gauge Finesse biopsy device. Multiple core samples were obtained and a postbiopsy clip was placed. The procedure was completed having made use of 10 cc of 0.5% Xylocaine with 0.25% Marcaine with one 200,000 units epinephrine. Skin preparation was with ChloraPrep. The procedure was well-tolerated. Skin defect was closed with benzoin and Steri-Strips followed by Telfa and Tegaderm dressing.    Assessment      Left breast cyst corresponding to mammographic abnormality. Hypoechoic mass at the 9 o'clock position biopsy.    Plan    The patient will be contacted when biopsy results are available. She was provided with written instructions in regards to wound care. She will return in one week for nursing evaluation.     PCP: Arlyss Queen Ivannia Willhelm 05/21/2013, 9:14 PM

## 2013-05-22 ENCOUNTER — Telehealth: Payer: Self-pay | Admitting: *Deleted

## 2013-05-22 LAB — PATHOLOGY

## 2013-05-22 NOTE — Telephone Encounter (Signed)
Please let the patient know that her recent breast biopsy was "ok" per Dr Bary Castilla.

## 2013-05-22 NOTE — Telephone Encounter (Signed)
Notified patient as instructed, patient pleased. Discussed follow-up appointments, patient agrees  

## 2013-05-28 ENCOUNTER — Ambulatory Visit (INDEPENDENT_AMBULATORY_CARE_PROVIDER_SITE_OTHER): Payer: Self-pay | Admitting: *Deleted

## 2013-05-28 DIAGNOSIS — N63 Unspecified lump in unspecified breast: Secondary | ICD-10-CM

## 2013-05-28 NOTE — Progress Notes (Signed)
Patient here today for follow up post left breast biopsy.  Dressing removed, steristrip in place and aware it may come off in one week.  Minimal bruising noted.  The patient is aware that a heating pad may be used for comfort as needed.  Aware of pathology. Follow up as scheduled. 

## 2013-06-06 ENCOUNTER — Encounter: Payer: Self-pay | Admitting: Internal Medicine

## 2013-06-06 ENCOUNTER — Ambulatory Visit (INDEPENDENT_AMBULATORY_CARE_PROVIDER_SITE_OTHER): Payer: Medicare Other | Admitting: Internal Medicine

## 2013-06-06 VITALS — BP 130/80 | HR 78 | Temp 97.9°F | Ht 64.0 in | Wt 147.5 lb

## 2013-06-06 DIAGNOSIS — I10 Essential (primary) hypertension: Secondary | ICD-10-CM

## 2013-06-06 DIAGNOSIS — C189 Malignant neoplasm of colon, unspecified: Secondary | ICD-10-CM

## 2013-06-06 DIAGNOSIS — J45909 Unspecified asthma, uncomplicated: Secondary | ICD-10-CM

## 2013-06-06 DIAGNOSIS — M79602 Pain in left arm: Secondary | ICD-10-CM

## 2013-06-06 DIAGNOSIS — M79609 Pain in unspecified limb: Secondary | ICD-10-CM

## 2013-06-06 DIAGNOSIS — R739 Hyperglycemia, unspecified: Secondary | ICD-10-CM

## 2013-06-06 DIAGNOSIS — D72819 Decreased white blood cell count, unspecified: Secondary | ICD-10-CM

## 2013-06-06 DIAGNOSIS — E7800 Pure hypercholesterolemia, unspecified: Secondary | ICD-10-CM

## 2013-06-06 DIAGNOSIS — N63 Unspecified lump in unspecified breast: Secondary | ICD-10-CM

## 2013-06-06 DIAGNOSIS — R7309 Other abnormal glucose: Secondary | ICD-10-CM

## 2013-06-06 LAB — LIPID PANEL
Cholesterol: 217 mg/dL — ABNORMAL HIGH (ref 0–200)
HDL: 87.6 mg/dL (ref >39.00)
LDL Cholesterol: 123 mg/dL — ABNORMAL HIGH (ref 0–99)
Total CHOL/HDL Ratio: 2
Triglycerides: 33 mg/dL (ref 0.0–149.0)
VLDL: 6.6 mg/dL (ref 0.0–40.0)

## 2013-06-06 LAB — CBC WITH DIFFERENTIAL/PLATELET
Basophils Absolute: 0 10*3/uL (ref 0.0–0.1)
Basophils Relative: 0.5 % (ref 0.0–3.0)
EOS ABS: 0 10*3/uL (ref 0.0–0.7)
Eosinophils Relative: 0.1 % (ref 0.0–5.0)
HCT: 38.2 % (ref 36.0–46.0)
Hemoglobin: 12.7 g/dL (ref 12.0–15.0)
Lymphocytes Relative: 36.2 % (ref 12.0–46.0)
Lymphs Abs: 1.3 10*3/uL (ref 0.7–4.0)
MCHC: 33.4 g/dL (ref 30.0–36.0)
MCV: 91.3 fl (ref 78.0–100.0)
MONO ABS: 0.4 10*3/uL (ref 0.1–1.0)
Monocytes Relative: 9.9 % (ref 3.0–12.0)
Neutro Abs: 2 10*3/uL (ref 1.4–7.7)
Neutrophils Relative %: 53.3 % (ref 43.0–77.0)
PLATELETS: 257 10*3/uL (ref 150.0–400.0)
RBC: 4.18 Mil/uL (ref 3.87–5.11)
RDW: 14.4 % (ref 11.5–15.5)
WBC: 3.7 10*3/uL — ABNORMAL LOW (ref 4.0–10.5)

## 2013-06-06 LAB — BASIC METABOLIC PANEL
BUN: 12 mg/dL (ref 6–23)
CALCIUM: 9.4 mg/dL (ref 8.4–10.5)
CO2: 32 meq/L (ref 19–32)
Chloride: 104 mEq/L (ref 96–112)
Creatinine, Ser: 0.6 mg/dL (ref 0.4–1.2)
GFR: 120.39 mL/min (ref >60.00)
GLUCOSE: 92 mg/dL (ref 70–99)
Potassium: 4.1 mEq/L (ref 3.5–5.1)
Sodium: 142 mEq/L (ref 135–145)

## 2013-06-06 LAB — HEPATIC FUNCTION PANEL
ALBUMIN: 4 g/dL (ref 3.5–5.2)
ALT: 14 U/L (ref 0–35)
AST: 21 U/L (ref 0–37)
Alkaline Phosphatase: 62 U/L (ref 39–117)
Bilirubin, Direct: 0.1 mg/dL (ref 0.0–0.3)
Total Bilirubin: 0.6 mg/dL (ref 0.2–1.2)
Total Protein: 7 g/dL (ref 6.0–8.3)

## 2013-06-06 LAB — TSH: TSH: 0.41 u[IU]/mL (ref 0.35–4.50)

## 2013-06-06 LAB — HEMOGLOBIN A1C: Hgb A1c MFr Bld: 5.6 % (ref 4.6–6.5)

## 2013-06-06 NOTE — Progress Notes (Signed)
Pre visit review using our clinic review tool, if applicable. No additional management support is needed unless otherwise documented below in the visit note. 

## 2013-06-06 NOTE — Progress Notes (Signed)
Subjective:    [name], female    DOB: 1928-10-23, 80 y.o.   MRN: [id number]  HPI 80 year old female with past history of hypertension, hypercholesterolemia and known asthma.  She comes in today for a scheduled follow up.  States she is doing well.  Breathing stable.  No chest pain or tightness.  No sob.  She is having some minimal drainage.   Eating and drinking well.  Had a recent colonoscopy (06/08/12).  States everything checked out fine.  Was told she had diverticulosis.  Bowels doing well.  She had lost weight.  She states initially cut back.  Weight is stable from last check, actually up a few pounds from last check.  Overall feels good.  She has noticed her left arm hurts when she leans her head back - washing her hair.  This is the only time she notices the pain.  Does not notice when she is lying down at night.       Past Medical History  Diagnosis Date  . Hypercholesterolemia   . Hypertension   . Osteoporosis     Current Outpatient Prescriptions on File Prior to Visit  Medication Sig Dispense Refill  . amLODipine (NORVASC) 10 MG tablet Take 1 tablet (10 mg total) by mouth daily.  30 tablet  5  . aspirin (ASPIRIN EC) 81 MG EC tablet Take 81 mg by mouth daily. Swallow whole.      . fluticasone (FLONASE) 50 MCG/ACT nasal spray Place 2 sprays into both nostrils daily.  16 g  2  . telmisartan (MICARDIS) 80 MG tablet Take 1 tablet (80 mg total) by mouth daily.  30 tablet  5   No current facility-administered medications on file prior to visit.    Review of Systems Patient denies any headache, lightheadedness or dizziness.  Minimal nasal congestion and drainage.   No chest pain, tightness or palpitations.  No increased shortness of breath, cough or congestion.  No chest congestion.  No fever.  No nausea or vomiting.  No acid reflux.  No abdominal pain or cramping.  No bowel change, such as diarrhea, constipation, BRBPR or melana.  No urine change.  Overall, she feel  she is doing well.  Left arm pain as outlined.       Objective:   Physical Exam  Filed Vitals:   06/06/13 0852  BP: 130/80  Pulse: 78  Temp: 97.9 F (36.6 C)   Blood pressure recheck:    71/65  80 year old female in no acute distress.   HEENT:  Nares- clear.  Oropharynx - without lesions. NECK:  Supple.  Nontender.  No audible bruit.  HEART:  Appears to be regular. LUNGS:  No crackles or wheezing audible.  Respirations even and unlabored.  RADIAL PULSE:  Equal bilaterally.  ABDOMEN:  Soft, nontender.  Bowel sounds present and normal.  No audible abdominal bruit.     EXTREMITIES:  No increased edema present.  DP pulses palpable and equal bilaterally.          Assessment & Plan:  ALLERGIES.  Allegra as needed.  Saline nasal spray as directed.  Flonase as directed.  Follow.    OPTHALMOLOGY.  S/p cataract surgery.  Following with opthalmology.    DERMATOLOGY.  Had a persistent ear lesion.  S/p removal by Dr Nehemiah Massed.     HEALTH MAINTENANCE.  Physical 02/01/13.  S/p hysterectomy.  Does not require yearly pap smears.  Colonoscopy 02/20/07 - diverticulosis and a 73mm  hyperplastic polyp.  Recommended follow up colonoscopy 01/2012.   Pt reported a follow up colonoscopy 06/08/12 - ok - diverticulosis.   Last mammogram 04/13/12 - BiRADS II.  Recent mammogram - Birads IV.  Saw Dr Bary Castilla.  Is s/p biopsy - negative.  She declined flu shot and pneumonia shot.   Have discussed shingles vaccine.    I spent 25 minutes with the patient and more than 50% of the time was spent in consultation regarding the above.

## 2013-06-07 ENCOUNTER — Other Ambulatory Visit: Payer: Self-pay | Admitting: Internal Medicine

## 2013-06-07 DIAGNOSIS — D72819 Decreased white blood cell count, unspecified: Secondary | ICD-10-CM

## 2013-06-07 NOTE — Progress Notes (Signed)
Order placed for f/u cbc.   

## 2013-06-11 ENCOUNTER — Encounter: Payer: Self-pay | Admitting: Internal Medicine

## 2013-06-11 DIAGNOSIS — M79602 Pain in left arm: Secondary | ICD-10-CM | POA: Insufficient documentation

## 2013-06-11 NOTE — Assessment & Plan Note (Signed)
Arm pain only occurs with washing her hair.  No other times.  Discussed further w/up.  She declines.  Will follow.

## 2013-06-11 NOTE — Assessment & Plan Note (Signed)
Low cholesterol diet and exercise.  Follow lipid profile.    

## 2013-06-11 NOTE — Assessment & Plan Note (Signed)
Last DLCO check was normal.  Breathing is stable.  CT scan 01/25/07 revealed stable probable probable area of fibrosis in the right lung apex.  Stable hepatic cyst.  Minimal area of fibrosis or atelectasis.  She is currently asymptomatic.  Did not feel inhalers helped.  Follow.    

## 2013-06-11 NOTE — Assessment & Plan Note (Signed)
Blood pressure under good control.  Follow metabolic panel.  Same medication regimen.   

## 2013-06-11 NOTE — Assessment & Plan Note (Signed)
Saw Dr Bary Castilla and is s/p biopsy.  Biopsy negative.  Plans to f/u with Dr Bary Castilla.

## 2013-06-11 NOTE — Assessment & Plan Note (Signed)
Follow cbc to confirm stable.  

## 2013-06-11 NOTE — Assessment & Plan Note (Signed)
Currently asymptomatic.  Followed by Dr Choksi.  Colonoscopy 02/20/07 revealed diverticulosis and a 7mm hyperplastic polyp.  Recommended follow up colonoscopy 01/2012.  Per pt, colonoscopy 06/08/12 revealed diverticulosis.  States was ok and was told did not need another colonoscopy.   

## 2013-06-14 ENCOUNTER — Encounter: Payer: Self-pay | Admitting: Internal Medicine

## 2013-06-21 ENCOUNTER — Encounter: Payer: Self-pay | Admitting: Internal Medicine

## 2013-06-21 ENCOUNTER — Ambulatory Visit (INDEPENDENT_AMBULATORY_CARE_PROVIDER_SITE_OTHER): Payer: Medicare Other | Admitting: Internal Medicine

## 2013-06-21 ENCOUNTER — Other Ambulatory Visit (INDEPENDENT_AMBULATORY_CARE_PROVIDER_SITE_OTHER): Payer: Medicare Other

## 2013-06-21 VITALS — HR 84 | Temp 98.1°F | Resp 16 | Ht 64.0 in | Wt 146.5 lb

## 2013-06-21 DIAGNOSIS — L989 Disorder of the skin and subcutaneous tissue, unspecified: Secondary | ICD-10-CM

## 2013-06-21 DIAGNOSIS — D72819 Decreased white blood cell count, unspecified: Secondary | ICD-10-CM

## 2013-06-21 LAB — CBC WITH DIFFERENTIAL/PLATELET
BASOS PCT: 0.5 % (ref 0.0–3.0)
Basophils Absolute: 0 10*3/uL (ref 0.0–0.1)
EOS PCT: 0.2 % (ref 0.0–5.0)
Eosinophils Absolute: 0 10*3/uL (ref 0.0–0.7)
HCT: 39 % (ref 36.0–46.0)
Hemoglobin: 13.1 g/dL (ref 12.0–15.0)
Lymphocytes Relative: 42.7 % (ref 12.0–46.0)
Lymphs Abs: 1.7 10*3/uL (ref 0.7–4.0)
MCHC: 33.6 g/dL (ref 30.0–36.0)
MCV: 90.7 fl (ref 78.0–100.0)
Monocytes Absolute: 0.5 10*3/uL (ref 0.1–1.0)
Monocytes Relative: 13.8 % — ABNORMAL HIGH (ref 3.0–12.0)
NEUTROS PCT: 42.8 % — AB (ref 43.0–77.0)
Neutro Abs: 1.7 10*3/uL (ref 1.4–7.7)
Platelets: 247 10*3/uL (ref 150.0–400.0)
RBC: 4.3 Mil/uL (ref 3.87–5.11)
RDW: 13.7 % (ref 11.5–15.5)
WBC: 3.9 10*3/uL — AB (ref 4.0–10.5)

## 2013-06-21 MED ORDER — SULFAMETHOXAZOLE-TRIMETHOPRIM 800-160 MG PO TABS: 1.0000 | ORAL_TABLET | Freq: Two times a day (BID) | ORAL | Status: DC

## 2013-06-21 MED ORDER — AMLODIPINE BESYLATE 10 MG PO TABS: 10.0000 mg | ORAL_TABLET | Freq: Every day | ORAL | Status: DC

## 2013-06-21 MED ORDER — TELMISARTAN 80 MG PO TABS: 80.0000 mg | ORAL_TABLET | Freq: Every day | ORAL | Status: DC

## 2013-06-21 NOTE — Progress Notes (Signed)
Pre visit review using our clinic review tool, if applicable. No additional management support is needed unless otherwise documented below in the visit note. 

## 2013-06-24 ENCOUNTER — Encounter: Payer: Self-pay | Admitting: Internal Medicine

## 2013-06-24 DIAGNOSIS — L989 Disorder of the skin and subcutaneous tissue, unspecified: Secondary | ICD-10-CM | POA: Insufficient documentation

## 2013-06-24 NOTE — Assessment & Plan Note (Signed)
Raised pustule with surround circular erythema.  Will treat with bactrim DS as directed.  Warm compresses.  Call with update in a few days.  Any worsening, she is to be evaluated.

## 2013-06-24 NOTE — Progress Notes (Signed)
  Subjective:    [name], female    DOB: 08-May-1928, 80 y.o.   MRN: [id number]  HPI 80 year old female with past history of hypertension, hypercholesterolemia and known asthma.  She comes in today as a work in with concerns regarding a persistent lesion on her leg.  States she first noticed it 1-2 weeks ago.  Noticed a hard place.  She then noticed the area "blistered".  She stuck a needle in it and cleaned the area with hydrogen peroxide and alcohol.  It did finally open and drain some a few days ago.  Still with persistent raised area.  No significant tenderness.  no increased redness extending up the leg.  No bite.  No other rash.  No fever.  Feels fine otherwise.        Past Medical History  Diagnosis Date  . Hypercholesterolemia   . Hypertension   . Osteoporosis     Current Outpatient Prescriptions on File Prior to Visit  Medication Sig Dispense Refill  . aspirin (ASPIRIN EC) 81 MG EC tablet Take 81 mg by mouth daily. Swallow whole.      . Cholecalciferol (VITAMIN D-3) 1000 UNITS CAPS Take 2,000 Units by mouth daily.      . fluticasone (FLONASE) 50 MCG/ACT nasal spray Place 2 sprays into both nostrils daily.  16 g  2   No current facility-administered medications on file prior to visit.    Review of Systems Patient denies fever.  No headache.   No increased shortness of breath, cough or congestion.  No chest congestion.   No nausea or vomiting.  No acid reflux.  No abdominal pain or cramping.  No bowel change, such as diarrhea, constipation, BRBPR or melana.  Right leg lesion as outlined.        Objective:   Physical Exam  Filed Vitals:   06/21/13 1308  Pulse: 84  Temp: 98.1 F (36.7 C)  Resp: 28   80 year old female in no acute distress.  HEART:  Appears to be regular. LUNGS:  No crackles or wheezing audible.  Respirations even and unlabored.    EXTREMITIES:  No increased edema present.  SKIN:  Small raised pustule right medial lower leg.   Approximately one cm erythematous circular area surrounding.  No significant tenderness to palpation.  No erythema extending up the leg.           Assessment & Plan:  HEALTH MAINTENANCE.  Physical 02/01/13.  S/p hysterectomy.  Does not require yearly pap smears.  Colonoscopy 02/20/07 - diverticulosis and a 44mm hyperplastic polyp.  Recommended follow up colonoscopy 01/2012.   Pt reported a follow up colonoscopy 06/08/12 - ok - diverticulosis.   Last mammogram 04/13/12 - BiRADS II.  Recent mammogram - Birads IV.  Saw Dr Bary Castilla.  Is s/p biopsy - negative.  She declined flu shot and pneumonia shot.   Have discussed shingles vaccine.    I spent 15 minutes with the patient and more than 50% of the time was spent in consultation regarding the above.

## 2013-06-25 ENCOUNTER — Encounter: Payer: Self-pay | Admitting: *Deleted

## 2013-06-26 ENCOUNTER — Telehealth: Payer: Self-pay | Admitting: *Deleted

## 2013-06-26 NOTE — Telephone Encounter (Signed)
Pt called to update on leg infection. She is still on Abx & the area looks really good (resolving)

## 2013-06-26 NOTE — Telephone Encounter (Signed)
Noted.  Let me know if it does not resolve.

## 2013-10-10 ENCOUNTER — Ambulatory Visit (INDEPENDENT_AMBULATORY_CARE_PROVIDER_SITE_OTHER): Payer: Medicare Other | Admitting: Internal Medicine

## 2013-10-10 ENCOUNTER — Encounter: Payer: Self-pay | Admitting: Internal Medicine

## 2013-10-10 VITALS — BP 130/80 | HR 80 | Temp 98.2°F | Ht 64.0 in | Wt 145.2 lb

## 2013-10-10 DIAGNOSIS — C189 Malignant neoplasm of colon, unspecified: Secondary | ICD-10-CM

## 2013-10-10 DIAGNOSIS — J4520 Mild intermittent asthma, uncomplicated: Secondary | ICD-10-CM

## 2013-10-10 DIAGNOSIS — L989 Disorder of the skin and subcutaneous tissue, unspecified: Secondary | ICD-10-CM

## 2013-10-10 DIAGNOSIS — N63 Unspecified lump in unspecified breast: Secondary | ICD-10-CM

## 2013-10-10 DIAGNOSIS — J45909 Unspecified asthma, uncomplicated: Secondary | ICD-10-CM

## 2013-10-10 DIAGNOSIS — R739 Hyperglycemia, unspecified: Secondary | ICD-10-CM

## 2013-10-10 DIAGNOSIS — D72819 Decreased white blood cell count, unspecified: Secondary | ICD-10-CM

## 2013-10-10 DIAGNOSIS — I10 Essential (primary) hypertension: Secondary | ICD-10-CM

## 2013-10-10 DIAGNOSIS — R7309 Other abnormal glucose: Secondary | ICD-10-CM

## 2013-10-10 DIAGNOSIS — E7800 Pure hypercholesterolemia, unspecified: Secondary | ICD-10-CM

## 2013-10-10 LAB — CBC WITH DIFFERENTIAL/PLATELET
BASOS ABS: 0 10*3/uL (ref 0.0–0.1)
Basophils Relative: 0.3 % (ref 0.0–3.0)
EOS ABS: 0 10*3/uL (ref 0.0–0.7)
Eosinophils Relative: 0.2 % (ref 0.0–5.0)
HCT: 42.3 % (ref 36.0–46.0)
HEMOGLOBIN: 13.9 g/dL (ref 12.0–15.0)
LYMPHS PCT: 38.2 % (ref 12.0–46.0)
Lymphs Abs: 1.5 10*3/uL (ref 0.7–4.0)
MCHC: 32.9 g/dL (ref 30.0–36.0)
MCV: 89.9 fl (ref 78.0–100.0)
MONOS PCT: 10.6 % (ref 3.0–12.0)
Monocytes Absolute: 0.4 10*3/uL (ref 0.1–1.0)
NEUTROS ABS: 2 10*3/uL (ref 1.4–7.7)
NEUTROS PCT: 50.7 % (ref 43.0–77.0)
PLATELETS: 232 10*3/uL (ref 150.0–400.0)
RBC: 4.7 Mil/uL (ref 3.87–5.11)
RDW: 14.3 % (ref 11.5–15.5)
WBC: 4 10*3/uL (ref 4.0–10.5)

## 2013-10-10 LAB — HEMOGLOBIN A1C: HEMOGLOBIN A1C: 6.1 % (ref 4.6–6.5)

## 2013-10-11 LAB — COMPREHENSIVE METABOLIC PANEL
ALT: 11 U/L (ref 0–35)
AST: 20 U/L (ref 0–37)
Albumin: 4.2 g/dL (ref 3.5–5.2)
Alkaline Phosphatase: 79 U/L (ref 39–117)
BUN: 11 mg/dL (ref 6–23)
CALCIUM: 9.3 mg/dL (ref 8.4–10.5)
CHLORIDE: 101 meq/L (ref 96–112)
CO2: 33 meq/L — AB (ref 19–32)
Creatinine, Ser: 0.7 mg/dL (ref 0.4–1.2)
GFR: 100.96 mL/min (ref >60.00)
Glucose, Bld: 104 mg/dL — ABNORMAL HIGH (ref 70–99)
Potassium: 4.3 mEq/L (ref 3.5–5.1)
SODIUM: 139 meq/L (ref 135–145)
TOTAL PROTEIN: 7.7 g/dL (ref 6.0–8.3)
Total Bilirubin: 0.6 mg/dL (ref 0.2–1.2)

## 2013-10-11 LAB — LIPID PANEL
Cholesterol: 232 mg/dL — ABNORMAL HIGH (ref 0–200)
HDL: 80.5 mg/dL (ref >39.00)
LDL Cholesterol: 140 mg/dL — ABNORMAL HIGH (ref 0–99)
NONHDL: 151.5
Total CHOL/HDL Ratio: 3
Triglycerides: 56 mg/dL (ref 0.0–149.0)
VLDL: 11.2 mg/dL (ref 0.0–40.0)

## 2013-10-12 ENCOUNTER — Encounter: Payer: Self-pay | Admitting: *Deleted

## 2013-10-14 ENCOUNTER — Encounter: Payer: Self-pay | Admitting: Internal Medicine

## 2013-10-14 NOTE — Assessment & Plan Note (Signed)
Blood pressure under good control.  Follow metabolic panel.  Same medication regimen.   

## 2013-10-14 NOTE — Assessment & Plan Note (Signed)
Low cholesterol diet and exercise.  Follow lipid profile.

## 2013-10-14 NOTE — Assessment & Plan Note (Signed)
Saw Dr Bary Castilla and is s/p biopsy.  Biopsy negative.  Unsure of f/u with Dr Bary Castilla.

## 2013-10-14 NOTE — Assessment & Plan Note (Signed)
Last DLCO check was normal.  Breathing is stable.  CT scan 01/25/07 revealed stable probable probable area of fibrosis in the right lung apex.  Stable hepatic cyst.  Minimal area of fibrosis or atelectasis.  She is currently asymptomatic.  Did not feel inhalers helped.  Follow.

## 2013-10-14 NOTE — Assessment & Plan Note (Signed)
Follow cbc to confirm stable.  

## 2013-10-14 NOTE — Assessment & Plan Note (Signed)
Currently asymptomatic.  Followed by Dr Oliva Bustard.  Colonoscopy 02/20/07 revealed diverticulosis and a 64mm hyperplastic polyp.  Recommended follow up colonoscopy 01/2012.  Per pt, colonoscopy 06/08/12 revealed diverticulosis.  States was ok and was told did not need another colonoscopy.

## 2013-10-14 NOTE — Assessment & Plan Note (Signed)
Has f/u with dermatology next week.

## 2013-10-14 NOTE — Progress Notes (Signed)
  Subjective:    [name], female    DOB: May 01, 1928, 80 y.o.   MRN: [id number]  HPI 80 year old female with past history of hypertension, hypercholesterolemia and known asthma.  She comes in today for a scheduled follow up.  She reports she is doing well.  Breathing stable.  Eating and drinking well.  No nausea or vomiting.  Bowels stable.  Has a f/u with dermatology next week.  Saw Dr Bary Castilla for abnormal mammogram.  Biopsy negative.  Unsure when f/u is scheduled.        Past Medical History  Diagnosis Date  . Hypercholesterolemia   . Hypertension   . Osteoporosis     Current Outpatient Prescriptions on File Prior to Visit  Medication Sig Dispense Refill  . amLODipine (NORVASC) 10 MG tablet Take 1 tablet (10 mg total) by mouth daily.  30 tablet  5  . aspirin (ASPIRIN EC) 81 MG EC tablet Take 81 mg by mouth daily. Swallow whole.      . Cholecalciferol (VITAMIN D-3) 1000 UNITS CAPS Take 2,000 Units by mouth daily.      Marland Kitchen telmisartan (MICARDIS) 80 MG tablet Take 1 tablet (80 mg total) by mouth daily.  30 tablet  5   No current facility-administered medications on file prior to visit.    Review of Systems Patient denies fever.  No headache.   No increased shortness of breath, cough or congestion.  No chest congestion.   No nausea or vomiting.  No acid reflux.  No abdominal pain or cramping.  No bowel change, such as diarrhea, constipation, BRBPR or melana.  Had abnormal mammogram.  Biopsy negative.  Saw Dr Bary Castilla.         Objective:   Physical Exam  Filed Vitals:   10/10/13 0938  BP: 130/80  Pulse: 80  Temp: 98.2 F (36.8 C)   Blood pressure recheck:  63/67  80 year old female in no acute distress.   HEENT:  Nares- clear.  Oropharynx - without lesions. NECK:  Supple.  Nontender.  No audible bruit.  HEART:  Appears to be regular. LUNGS:  No crackles or wheezing audible.  Respirations even and unlabored.  RADIAL PULSE:  Equal bilaterally. ABDOMEN:  Soft,  nontender.  Bowel sounds present and normal.  No audible abdominal bruit.   EXTREMITIES:  No increased edema present.  DP pulses palpable and equal bilaterally.          Assessment & Plan:  HEALTH MAINTENANCE.  Physical 02/01/13.  S/p hysterectomy.  Does not require yearly pap smears.  Colonoscopy 02/20/07 - diverticulosis and a 90mm hyperplastic polyp.  Recommended follow up colonoscopy 01/2012.   Pt reported a follow up colonoscopy 06/08/12 - ok - diverticulosis.   Last mammogram 04/13/12 - BiRADS II.  Recent mammogram - Birads IV.  Saw Dr Bary Castilla.  Is s/p biopsy - negative.  She declined flu shot and pneumonia shot.   Have discussed shingles vaccine.    I spent 15 minutes with the patient and more than 50% of the time was spent in consultation regarding the above.

## 2013-10-31 ENCOUNTER — Encounter: Payer: Self-pay | Admitting: Internal Medicine

## 2013-11-26 ENCOUNTER — Encounter: Payer: Self-pay | Admitting: Internal Medicine

## 2014-02-05 ENCOUNTER — Other Ambulatory Visit: Payer: Self-pay | Admitting: *Deleted

## 2014-02-05 MED ORDER — TELMISARTAN 80 MG PO TABS: 80.0000 mg | ORAL_TABLET | Freq: Every day | ORAL | Status: DC

## 2014-02-05 MED ORDER — AMLODIPINE BESYLATE 10 MG PO TABS: 10.0000 mg | ORAL_TABLET | Freq: Every day | ORAL | Status: DC

## 2014-02-11 ENCOUNTER — Encounter: Payer: Medicare Other | Admitting: Internal Medicine

## 2014-02-12 ENCOUNTER — Ambulatory Visit: Payer: Self-pay | Admitting: Internal Medicine

## 2014-02-12 ENCOUNTER — Encounter: Payer: Self-pay | Admitting: Internal Medicine

## 2014-02-12 ENCOUNTER — Ambulatory Visit (INDEPENDENT_AMBULATORY_CARE_PROVIDER_SITE_OTHER): Payer: Medicare Other | Admitting: Internal Medicine

## 2014-02-12 VITALS — BP 120/80 | HR 84 | Temp 97.8°F | Ht 63.0 in | Wt 145.5 lb

## 2014-02-12 DIAGNOSIS — I10 Essential (primary) hypertension: Secondary | ICD-10-CM

## 2014-02-12 DIAGNOSIS — J4520 Mild intermittent asthma, uncomplicated: Secondary | ICD-10-CM

## 2014-02-12 DIAGNOSIS — D72819 Decreased white blood cell count, unspecified: Secondary | ICD-10-CM

## 2014-02-12 DIAGNOSIS — C189 Malignant neoplasm of colon, unspecified: Secondary | ICD-10-CM

## 2014-02-12 DIAGNOSIS — E7800 Pure hypercholesterolemia, unspecified: Secondary | ICD-10-CM

## 2014-02-12 DIAGNOSIS — N63 Unspecified lump in unspecified breast: Secondary | ICD-10-CM

## 2014-02-12 LAB — LIPID PANEL
CHOLESTEROL: 219 mg/dL — AB (ref 0–200)
HDL: 70.9 mg/dL (ref >39.00)
LDL Cholesterol: 137 mg/dL — ABNORMAL HIGH (ref 0–99)
NONHDL: 148.1
Total CHOL/HDL Ratio: 3
Triglycerides: 56 mg/dL (ref 0.0–149.0)
VLDL: 11.2 mg/dL (ref 0.0–40.0)

## 2014-02-12 LAB — BASIC METABOLIC PANEL
BUN: 15 mg/dL (ref 6–23)
CALCIUM: 9.4 mg/dL (ref 8.4–10.5)
CO2: 34 meq/L — AB (ref 19–32)
Chloride: 101 mEq/L (ref 96–112)
Creatinine, Ser: 0.71 mg/dL (ref 0.40–1.20)
GFR: 100.88 mL/min (ref >60.00)
GLUCOSE: 99 mg/dL (ref 70–99)
POTASSIUM: 4 meq/L (ref 3.5–5.1)
SODIUM: 139 meq/L (ref 135–145)

## 2014-02-12 LAB — HEPATIC FUNCTION PANEL
ALK PHOS: 74 U/L (ref 39–117)
ALT: 10 U/L (ref 0–35)
AST: 17 U/L (ref 0–37)
Albumin: 4.1 g/dL (ref 3.5–5.2)
Bilirubin, Direct: 0.1 mg/dL (ref 0.0–0.3)
Total Bilirubin: 0.6 mg/dL (ref 0.2–1.2)
Total Protein: 7 g/dL (ref 6.0–8.3)

## 2014-02-12 NOTE — Progress Notes (Signed)
Subjective:    [name], female    DOB: 11-21-1928, 81 y.o.   MRN: [id number]  HPI 81 year old female with past history of hypertension, hypercholesterolemia and known asthma.  She comes in today to follow up on these issues as well as for a complete physical exam.  She reports she is doing well.  Breathing stable.  Eating and drinking well.  No nausea or vomiting.  Bowels stable. Saw Dr Bary Castilla for abnormal mammogram.  Biopsy negative.  States she is having problems with her right knee.  Some increased pain.  Present for the last 1-2 months.  No known injury.      Past Medical History  Diagnosis Date  . Hypercholesterolemia   . Hypertension   . Osteoporosis     Current Outpatient Prescriptions on File Prior to Visit  Medication Sig Dispense Refill  . amLODipine (NORVASC) 10 MG tablet Take 1 tablet (10 mg total) by mouth daily. 30 tablet 5  . aspirin (ASPIRIN EC) 81 MG EC tablet Take 81 mg by mouth daily. Swallow whole.    . Cholecalciferol (VITAMIN D-3) 1000 UNITS CAPS Take 2,000 Units by mouth daily.    Marland Kitchen telmisartan (MICARDIS) 80 MG tablet Take 1 tablet (80 mg total) by mouth daily. 30 tablet 5   No current facility-administered medications on file prior to visit.    Review of Systems Patient denies fever.  No headache.   No increased shortness of breath, cough or congestion.  No chest congestion.   No nausea or vomiting.  No acid reflux.  No abdominal pain or cramping.  No bowel change, such as diarrhea, constipation, BRBPR or melana.  Had abnormal mammogram.  Biopsy negative.  Saw Dr Bary Castilla.  Overall she feels she is doing well.         Objective:   Physical Exam  Filed Vitals:   02/12/14 1026  BP: 120/80  Pulse: 84  Temp: 97.8 F (36.6 C)   Blood pressure recheck:  42/35-67  81 year old female in no acute distress.   HEENT:  Nares- clear.  Oropharynx - without lesions. NECK:  Supple.  Nontender.  No audible bruit.  HEART:  Appears to be  regular. LUNGS:  No crackles or wheezing audible.  Respirations even and unlabored.  RADIAL PULSE:  Equal bilaterally.    BREASTS:  No nipple discharge or nipple retraction present.  Could not appreciate any distinct nodules or axillary adenopathy.  ABDOMEN:  Soft, nontender.  Bowel sounds present and normal.  No audible abdominal bruit.  GU:  Not performed.    EXTREMITIES:  No increased edema present.  DP pulses palpable and equal bilaterally.          Assessment & Plan:  1. Essential hypertension Blood pressure doing well.  Same medication regimen.  Follow.   - Basic metabolic panel  2. Hypercholesterolemia Low cholesterol diet and exercise.   - Lipid panel  3. Colon adenocarcinoma Colonoscopy 06/08/12 - ok - diverticulosis.  States was told did not need another colonoscopy.   - Hepatic function panel  4. Asthma, mild intermittent, uncomplicated Breathing stable.    5. Leukopenia Follow cbc.   6. Lump or mass in breast Mammogram as outlined.  Spoke to Dr Dwyane Luo office.  They are going to schedule pt for f/u mammogram in 03/2014.   HEALTH MAINTENANCE.  Physical today.  S/p hysterectomy.  Does not require yearly pap smears.  Colonoscopy 02/20/07 - diverticulosis and a 75mm hyperplastic polyp.  Recommended follow up colonoscopy 01/2012.   Pt reported a follow up colonoscopy 06/08/12 - ok - diverticulosis.   Mammogram 04/13/12 - BiRADS II.  Mammogram 04/16/13 recommended f/u views.  F/u left breast mammogram 04/20/13  - Birads IV.  Saw Dr Bary Castilla.  Is s/p biopsy - negative.  She declined flu shot and pneumonia shot.   Have discussed shingles vaccine.    I spent 25 minutes with the patient and more than 50% of the time was spent in consultation regarding the above.

## 2014-02-12 NOTE — Progress Notes (Signed)
Pre visit review using our clinic review tool, if applicable. No additional management support is needed unless otherwise documented below in the visit note. 

## 2014-02-13 ENCOUNTER — Encounter: Payer: Self-pay | Admitting: *Deleted

## 2014-02-16 ENCOUNTER — Encounter: Payer: Self-pay | Admitting: Internal Medicine

## 2014-02-17 ENCOUNTER — Telehealth: Payer: Self-pay | Admitting: Internal Medicine

## 2014-02-17 NOTE — Telephone Encounter (Signed)
Notify pt that her knee xray reveals changes c/w arthritis.  Given persistent pain and xray changes - would like to refer to Dr Jefm Bryant or ortho for further evaluation and question of need for injection.

## 2014-02-18 ENCOUNTER — Other Ambulatory Visit: Payer: Self-pay | Admitting: Internal Medicine

## 2014-02-18 DIAGNOSIS — M25569 Pain in unspecified knee: Secondary | ICD-10-CM

## 2014-02-18 NOTE — Telephone Encounter (Signed)
Order placed for referral.  

## 2014-02-18 NOTE — Progress Notes (Signed)
Order placed for referral to ortho 

## 2014-02-18 NOTE — Telephone Encounter (Signed)
Called and advised patient of results,  verbalized understanding. Agreeable to referral. Advised our [facility] would call her once an appointment has been scheduled

## 2014-03-01 ENCOUNTER — Encounter: Payer: Self-pay | Admitting: Internal Medicine

## 2014-04-26 ENCOUNTER — Encounter: Payer: Self-pay | Admitting: General Surgery

## 2014-04-30 ENCOUNTER — Encounter: Payer: Self-pay | Admitting: General Surgery

## 2014-04-30 ENCOUNTER — Ambulatory Visit (INDEPENDENT_AMBULATORY_CARE_PROVIDER_SITE_OTHER): Payer: Medicare Other | Admitting: General Surgery

## 2014-04-30 VITALS — BP 126/66 | HR 72 | Resp 12 | Ht 63.0 in | Wt 148.0 lb

## 2014-04-30 DIAGNOSIS — N63 Unspecified lump in unspecified breast: Secondary | ICD-10-CM

## 2014-04-30 NOTE — Patient Instructions (Signed)
Patient will be asked to return to the office in one year with a bilateral screening mammogram. 

## 2014-04-30 NOTE — Progress Notes (Signed)
[name], female   DOB: 1928-11-01, 81 y.o.   MRN: [id number]  Chief Complaint  Patient presents with  . Follow-up    mammogram    HPI Kathy Dominguez is a 81 y.o. female who presents for a breast evaluation. The most recent mammogram was done on 04/19/14.  Patient does perform regular self breast checks and gets regular mammograms done.    HPI  Past Medical History  Diagnosis Date  . Hypercholesterolemia   . Hypertension   . Osteoporosis     Past Surgical History  Procedure Laterality Date  . Thyroidectomy, partial  1956  . Abdominal hysterectomy    . Tubal ligation      Family History  Problem Relation Age of Onset  . Stroke Mother   . Colon cancer Father   . Breast cancer Daughter 47  . Breast cancer Cousin     Social History History  Substance Use Topics  . Smoking status: Never Smoker   . Smokeless tobacco: Never Used  . Alcohol Use: No    No Known Allergies  Current Outpatient Prescriptions  Medication Sig Dispense Refill  . amLODipine (NORVASC) 10 MG tablet Take 1 tablet (10 mg total) by mouth daily. 30 tablet 5  . aspirin (ASPIRIN EC) 81 MG EC tablet Take 81 mg by mouth daily. Swallow whole.    . Cholecalciferol (VITAMIN D-3) 1000 UNITS CAPS Take 2,000 Units by mouth daily.    Marland Kitchen telmisartan (MICARDIS) 80 MG tablet Take 1 tablet (80 mg total) by mouth daily. 30 tablet 5   No current facility-administered medications for this visit.    Review of Systems Review of Systems  Constitutional: Negative.   Respiratory: Negative.   Cardiovascular: Negative.     Blood pressure 126/66, pulse 72, resp. rate 12, height 5\' 3"  (1.6 m), weight 148 lb (67.132 kg), last menstrual period 02/21/1966.  Physical Exam Physical Exam  Constitutional: She is oriented to person, place, and time. She appears well-developed and well-nourished.  Eyes: Conjunctivae are normal. No scleral icterus.  Neck: Neck supple.  Cardiovascular: Normal rate, regular  rhythm and normal heart sounds.   Pulmonary/Chest: Effort normal and breath sounds normal. Right breast exhibits no inverted nipple, no mass, no nipple discharge, no skin change and no tenderness. Left breast exhibits no inverted nipple, no mass, no nipple discharge, no skin change and no tenderness.  Abdominal: Soft. Bowel sounds are normal. There is no tenderness.  Lymphadenopathy:    She has no cervical adenopathy.    She has no axillary adenopathy.  Neurological: She is alert and oriented to person, place, and time.  Skin: Skin is warm and dry.    Data Reviewed Left breast biopsy results of 05/21/2013: Nodular fibrosis likely accounts for ultrasound findings. (Not a mammographic abnormality). Cyst at 11 o'clock accounts for mammographic lesion prompting ultrasound.  Bilateral mammograms dated 04/26/2014 showed a persistent asymmetry. Ultrasound showed a intramammary lymph node at the 6:00 position. BI-RADS-3.  Assessment    Benign breast exam. -2015 biopsy.    Plan    The radiologist recommendation for a 6 month follow-up was reviewed with the patient. Considering the benign ultrasound appearance at the 6:00 position suggesting an intramammary lymph node, I believe a 1 year follow-up as appropriate. This is except for the patient.  Patient will be asked to return to the office in one year with a bilateral screening mammogram.      PCP:  Nash Shearer 05/01/2014, 9:28 PM

## 2014-05-06 ENCOUNTER — Encounter: Payer: Self-pay | Admitting: General Surgery

## 2014-05-17 NOTE — Op Note (Signed)
PATIENT NAME:  Kathy Dominguez, Kathy Dominguez MR#:  [id number] DATE OF BIRTH:  1928/11/02  DATE OF PROCEDURE:  02/28/2012  PREOPERATIVE DIAGNOSIS:  Cataract, left eye.    POSTOPERATIVE DIAGNOSIS:  Cataract, left eye.  PROCEDURE PERFORMED:  Extracapsular cataract extraction using phacoemulsification with placement of an Alcon [id number], 21.0-diopter posterior chamber lens, serial #[id number].  SURGEON:  Loura Back. Maury Bamba, MD  ASSISTANT:  None.  ANESTHESIA:  4% lidocaine and 0.75% Marcaine in a 50/50 mixture with 10 units/mL of Hylenex added, given as a peribulbar.   ANESTHESIOLOGIST:  Julianne Handler, MD  COMPLICATIONS:  None.  ESTIMATED BLOOD LOSS:  Less than 1 ml.  DESCRIPTION OF PROCEDURE:  The patient was brought to the operating room and given a peribulbar block.  The patient was then prepped and draped in the usual fashion.  The vertical rectus muscles were imbricated using 5-0 silk sutures.  These sutures were then clamped to the sterile drapes as bridle sutures.  A limbal peritomy was performed extending two clock hours and hemostasis was obtained with cautery.  A partial thickness scleral groove was made at the surgical limbus and dissected anteriorly in a lamellar dissection using an Alcon crescent knife.  The anterior chamber was entered supero-temporally with a Superblade and through the lamellar dissection with a 2.6 mm keratome.  DisCoVisc was used to replace the aqueous and a continuous tear capsulorrhexis was carried out.  Hydrodissection and hydrodelineation were carried out with balanced salt and a 27 gauge canula.  The nucleus was rotated to confirm the effectiveness of the hydrodissection.  Phacoemulsification was carried out using a divide-and-conquer technique.  Total ultrasound time was 1 minutes and 4 seconds with an average power of 22.8 percent and CDE of 28.80.  Irrigation/aspiration was used to remove the residual cortex.  DisCoVisc was used to inflate the capsule and  the internal incision was enlarged to 3 mm with the crescent knife.  The intraocular lens was folded and inserted into the capsular bag using the AcrySert delivery system.  Irrigation/aspiration was used to remove the residual DisCoVisc.  Miostat was injected into the anterior chamber through the paracentesis track to inflate the anterior chamber and induce miosis.  A tenth of a milliliter of cefuroxime was injected via the paracentesis tract. The wound was checked for leaks and none were found. The conjunctiva was closed with cautery and the bridle sutures were removed.  Two drops of 0.3% Vigamox were placed on the eye.   An eye shield was placed on the eye.  The patient was discharged to the recovery room in good condition. ____________________________ Loura Back Davie Sagona, MD sad:sb D: 02/28/2012 12:44:11 ET T: 02/28/2012 13:46:36 ET JOB#: [id number]  cc: Remo Lipps A. Cheick Suhr, MD, <Dictator> Martie Lee MD ELECTRONICALLY SIGNED 03/06/2012 13:09

## 2014-05-17 NOTE — Op Note (Signed)
PATIENT NAME:  Kathy Dominguez, Kathy Dominguez MR#:  [id number] DATE OF BIRTH:  [birth year]  DATE OF PROCEDURE:  01/31/2012  PREOPERATIVE DIAGNOSIS: Cataract, right eye.   POSTOPERATIVE DIAGNOSIS: Cataract, right eye.   PROCEDURE PERFORMED: Extracapsular cataract extraction using phacoemulsification with placement of Alcon [id number], 21.0 diopter posterior chamber lens, serial number [id number].   SURGEON: Loura Back. Dustin Bumbaugh, M.D.   ANESTHESIA: 4% lidocaine, 0.75% Marcaine a 50-50 mixture with 10 units/mL of HyoMax added, given as a peribulbar.   ANESTHESIOLOGIST: Dr. Andree Elk.   COMPLICATIONS: None.   ESTIMATED BLOOD LOSS: Less than 1 mL.   DESCRIPTION OF PROCEDURE:  The patient was brought to the operating room and given a peribulbar block.  The patient was then prepped and draped in the usual fashion.  The vertical rectus muscles were imbricated using 5-0 silk sutures.  These sutures were then clamped to the sterile drapes as bridle sutures.  A limbal peritomy was performed extending two clock hours and hemostasis was obtained with cautery.  A partial thickness scleral groove was made at the surgical limbus and then dissected anteriorly in a lamellar dissection with using an Alcon crescent knife.  The anterior chamber was entered superonasally with a Superblade and through the lamellar dissection with a 2.6-mm keratome.  DisCoVisc was used to replace the aqueous and a continuous tear capsulorrhexis was carried out.  Hydrodissection and hydrodelineation were carried out with balanced salt and a 27 gauge canula.  The nucleus was rotated to confirm the effectiveness of the hydrodissection.  Phacoemulsification was carried out using a divide-and-conquer technique.  Total ultrasound time was 1 minute and 6 seconds with an average power of  15.1%. CDE 17.68.     Irrigation/aspiration was used to remove the residual cortex.  DisCoVisc was used to inflate the capsule and the internal wound was enlarged to 3 mm  with the crescent knife.  The intraocular lens was inserted into the capsular bag using the AcrySert delivery system.  Irrigation/aspiration was used to remove the residual DisCoVisc.  Miostat was injected into the anterior chamber through the paracentesis track to inflate the anterior chamber and induce miosis.  The wound was checked for leaks and wound leakage was found.  The conjunctiva was closed with cautery and the bridle sutures were removed.  Two drops of 0.3% Vigamox were placed on the eye.  An eye shield was placed on the eye.  The patient was discharged to the recovery room in good condition.   ____________________________ Loura Back Shatavia Santor, MD sad:aw D: 01/31/2012 13:42:03 ET T: 02/01/2012 05:53:04 ET JOB#: [id number]  cc: Remo Lipps A. Luan Maberry, MD, <Dictator> Martie Lee MD ELECTRONICALLY SIGNED 02/07/2012 13:09

## 2014-06-13 ENCOUNTER — Ambulatory Visit (INDEPENDENT_AMBULATORY_CARE_PROVIDER_SITE_OTHER): Payer: Medicare Other | Admitting: Internal Medicine

## 2014-06-13 ENCOUNTER — Encounter: Payer: Self-pay | Admitting: Internal Medicine

## 2014-06-13 VITALS — BP 130/70 | HR 81 | Temp 97.6°F | Ht 63.0 in | Wt 147.1 lb

## 2014-06-13 DIAGNOSIS — E7800 Pure hypercholesterolemia, unspecified: Secondary | ICD-10-CM

## 2014-06-13 DIAGNOSIS — Z0000 Encounter for general adult medical examination without abnormal findings: Secondary | ICD-10-CM

## 2014-06-13 DIAGNOSIS — D72819 Decreased white blood cell count, unspecified: Secondary | ICD-10-CM

## 2014-06-13 DIAGNOSIS — C189 Malignant neoplasm of colon, unspecified: Secondary | ICD-10-CM

## 2014-06-13 DIAGNOSIS — I10 Essential (primary) hypertension: Secondary | ICD-10-CM | POA: Diagnosis not present

## 2014-06-13 DIAGNOSIS — J4520 Mild intermittent asthma, uncomplicated: Secondary | ICD-10-CM

## 2014-06-13 LAB — LIPID PANEL
CHOLESTEROL: 206 mg/dL — AB (ref 0–200)
HDL: 81.6 mg/dL (ref >39.00)
LDL CALC: 117 mg/dL — AB (ref 0–99)
NonHDL: 124.4
Total CHOL/HDL Ratio: 3
Triglycerides: 36 mg/dL (ref 0.0–149.0)
VLDL: 7.2 mg/dL (ref 0.0–40.0)

## 2014-06-13 LAB — HEPATIC FUNCTION PANEL
ALT: 12 U/L (ref 0–35)
AST: 19 U/L (ref 0–37)
Albumin: 4.1 g/dL (ref 3.5–5.2)
Alkaline Phosphatase: 78 U/L (ref 39–117)
BILIRUBIN DIRECT: 0.1 mg/dL (ref 0.0–0.3)
BILIRUBIN TOTAL: 0.5 mg/dL (ref 0.2–1.2)
Total Protein: 7.2 g/dL (ref 6.0–8.3)

## 2014-06-13 LAB — BASIC METABOLIC PANEL
BUN: 14 mg/dL (ref 6–23)
CO2: 35 meq/L — AB (ref 19–32)
CREATININE: 0.68 mg/dL (ref 0.40–1.20)
Calcium: 9.6 mg/dL (ref 8.4–10.5)
Chloride: 101 mEq/L (ref 96–112)
GFR: 105.95 mL/min (ref >60.00)
GLUCOSE: 92 mg/dL (ref 70–99)
Potassium: 4.7 mEq/L (ref 3.5–5.1)
SODIUM: 139 meq/L (ref 135–145)

## 2014-06-13 LAB — TSH: TSH: 0.51 u[IU]/mL (ref 0.35–4.50)

## 2014-06-13 NOTE — Progress Notes (Signed)
Subjective:  [name], female    DOB: 05-16-1928  Age: 81 y.o. MRN: [id number]  CC: The primary encounter diagnosis was Essential hypertension. Diagnoses of Asthma, mild intermittent, uncomplicated, Colon adenocarcinoma, Hypercholesterolemia, Health care maintenance, and Leukopenia were also pertinent to this visit.    HPI Kathy Dominguez presents for a scheduled follow up.  Some runny nose.  No sob.  No DOE.  No cardiac symptoms with increased activity or exertion.  No nausea or vomiting.  No bowel change.  Still with some right knee pain.  Saw ortho.  S/p injection.  Some better at night.  Desires no further intervention at this time.  Handling stress well.     Past Medical History  Diagnosis Date  . Hypercholesterolemia   . Hypertension   . Osteoporosis     Outpatient Prescriptions Prior to Visit  Medication Sig Dispense Refill  . amLODipine (NORVASC) 10 MG tablet Take 1 tablet (10 mg total) by mouth daily. 30 tablet 5  . aspirin (ASPIRIN EC) 81 MG EC tablet Take 81 mg by mouth daily. Swallow whole.    . Cholecalciferol (VITAMIN D-3) 1000 UNITS CAPS Take 2,000 Units by mouth daily.    Marland Kitchen telmisartan (MICARDIS) 80 MG tablet Take 1 tablet (80 mg total) by mouth daily. 30 tablet 5   No facility-administered medications prior to visit.    ROS Review of Systems  Constitutional: Negative for appetite change and unexpected weight change.  HENT: Negative for congestion and sinus pressure.        Some runny nose.   Respiratory: Negative for cough, chest tightness and shortness of breath.   Cardiovascular: Negative for chest pain, palpitations and leg swelling.  Gastrointestinal: Negative for nausea, vomiting, abdominal pain and diarrhea.  Musculoskeletal: Negative for joint swelling.       Some persistent right knee pain as outlined.    Skin: Positive for rash (lower anterior tib region.  ).  Neurological: Negative for dizziness, light-headedness and headaches.     Objective:  BP 130/70 mmHg  Pulse 81  Temp(Src) 97.6 F (36.4 C) (Oral)  Ht 5\' 3"  (1.6 m)  Wt 147 lb 2 oz (66.735 kg)  BMI 26.07 kg/m2  SpO2 92%  LMP 02/21/1966  BP Readings from Last 3 Encounters:  06/13/14 130/70  04/30/14 126/66  02/12/14 120/80    Wt Readings from Last 3 Encounters:  06/13/14 147 lb 2 oz (66.735 kg)  04/30/14 148 lb (67.132 kg)  02/12/14 145 lb 8 oz (65.998 kg)    Pulse ox recheck:  94% room air, blood pressure recheck:  128/78  Physical Exam  Constitutional: She appears well-developed and well-nourished. No distress.  HENT:  Nose: Nose normal.  Mouth/Throat: Oropharynx is clear and moist.  Neck: Neck supple. No thyromegaly present.  Cardiovascular: Normal rate and regular rhythm.   Pulmonary/Chest: Breath sounds normal. No respiratory distress. She has no wheezes.  Abdominal: Soft. Bowel sounds are normal. There is no tenderness.  Musculoskeletal: She exhibits no edema or tenderness.  Lymphadenopathy:    She has no cervical adenopathy.  Skin: Skin is warm and dry. Rash (ant tib - erythematous rash - small area. ) noted.  Psychiatric: She has a normal mood and affect. Her behavior is normal.    Lab Results  Component Value Date   HGBA1C 6.1 10/10/2013   HGBA1C 5.6 06/06/2013   HGBA1C 6.1 02/08/2013    Lab Results  Component Value Date   CREATININE 0.68 06/13/2014   CREATININE  0.71 02/12/2014   CREATININE 0.7 10/10/2013    Lab Results  Component Value Date   WBC 4.0 10/10/2013   HGB 13.9 10/10/2013   HCT 42.3 10/10/2013   PLT 232.0 10/10/2013   GLUCOSE 92 06/13/2014   CHOL 206* 06/13/2014   TRIG 36.0 06/13/2014   HDL 81.60 06/13/2014   LDLDIRECT 128.3 02/08/2013   LDLCALC 117* 06/13/2014   ALT 12 06/13/2014   AST 19 06/13/2014   NA 139 06/13/2014   K 4.7 06/13/2014   CL 101 06/13/2014   CREATININE 0.68 06/13/2014   BUN 14 06/13/2014   CO2 35* 06/13/2014   TSH 0.51 06/13/2014   HGBA1C 6.1 10/10/2013      Assessment & Plan:   Problem List Items Addressed This Visit    Asthma    Last DLCO normal.  Breathing - she feels is stable.  Did not feel inhalers helped.  Follow.        Colon adenocarcinoma    Colonoscopy 06/08/12 - diverticulosis otherwise normal.        Health care maintenance    Physical 02/12/14.  S/p hysterectomy.  Seeing Dr Bary Castilla for her breast and f/u mammogram.  Mammogram 04/26/14 - Birads III.  He recommended f/u in one year.  06/08/12 - colonoscopy ok.       Hypercholesterolemia    Low cholesterol diet and exercise.  Follow lipid panel.   Lab Results  Component Value Date   CHOL 206* 06/13/2014   HDL 81.60 06/13/2014   LDLCALC 117* 06/13/2014   LDLDIRECT 128.3 02/08/2013   TRIG 36.0 06/13/2014   CHOLHDL 3 06/13/2014        Relevant Orders   Hepatic function panel (Completed)   Lipid panel (Completed)   Hypertension - Primary    Blood pressure under good control.  Follow metabolic panel.  Same medication regimen.        Relevant Orders   TSH (Completed)   Basic metabolic panel (Completed)   Leukopenia    Follow cbc to confirm stable.          I am having [name] maintain her aspirin, Vitamin D-3, amLODipine, and telmisartan.   Follow-up: Return in about 4 months (around 10/14/2014) for follow up appt (73min).   Einar Pheasant, MD

## 2014-06-13 NOTE — Progress Notes (Signed)
Pre visit review using our clinic review tool, if applicable. No additional management support is needed unless otherwise documented below in the visit note. 

## 2014-06-13 NOTE — Assessment & Plan Note (Signed)
Physical 02/12/14.  S/p hysterectomy.  Seeing Dr Bary Castilla for her breast and f/u mammogram.  Mammogram 04/26/14 - Birads III.  He recommended f/u in one year.  06/08/12 - colonoscopy ok.

## 2014-06-14 ENCOUNTER — Encounter: Payer: Self-pay | Admitting: *Deleted

## 2014-06-24 ENCOUNTER — Encounter: Payer: Self-pay | Admitting: Internal Medicine

## 2014-06-24 NOTE — Assessment & Plan Note (Signed)
Follow cbc to confirm stable.

## 2014-06-24 NOTE — Assessment & Plan Note (Signed)
Colonoscopy 06/08/12 - diverticulosis otherwise normal.

## 2014-06-24 NOTE — Assessment & Plan Note (Signed)
Low cholesterol diet and exercise.  Follow lipid panel.   Lab Results  Component Value Date   CHOL 206* 06/13/2014   HDL 81.60 06/13/2014   LDLCALC 117* 06/13/2014   LDLDIRECT 128.3 02/08/2013   TRIG 36.0 06/13/2014   CHOLHDL 3 06/13/2014

## 2014-06-24 NOTE — Assessment & Plan Note (Signed)
Blood pressure under good control.  Follow metabolic panel.  Same medication regimen.

## 2014-06-24 NOTE — Assessment & Plan Note (Signed)
Last DLCO normal.  Breathing - she feels is stable.  Did not feel inhalers helped.  Follow.

## 2014-07-17 ENCOUNTER — Other Ambulatory Visit: Payer: Self-pay | Admitting: *Deleted

## 2014-07-17 MED ORDER — TELMISARTAN 80 MG PO TABS: 80.0000 mg | ORAL_TABLET | Freq: Every day | ORAL | Status: DC

## 2014-07-25 ENCOUNTER — Ambulatory Visit (INDEPENDENT_AMBULATORY_CARE_PROVIDER_SITE_OTHER): Payer: Medicare Other | Admitting: Nurse Practitioner

## 2014-07-25 ENCOUNTER — Encounter: Payer: Self-pay | Admitting: Nurse Practitioner

## 2014-07-25 VITALS — BP 108/76 | HR 75 | Temp 98.0°F | Resp 12 | Ht 63.0 in | Wt 147.4 lb

## 2014-07-25 DIAGNOSIS — R5383 Other fatigue: Secondary | ICD-10-CM | POA: Diagnosis not present

## 2014-07-25 DIAGNOSIS — R0602 Shortness of breath: Secondary | ICD-10-CM | POA: Diagnosis not present

## 2014-07-25 LAB — CBC WITH DIFFERENTIAL/PLATELET
Basophils Absolute: 0 10*3/uL (ref 0.0–0.1)
Basophils Relative: 0 % (ref 0–1)
Eosinophils Absolute: 0 10*3/uL (ref 0.0–0.7)
Eosinophils Relative: 0 % (ref 0–5)
HCT: 42.9 % (ref 36.0–46.0)
Hemoglobin: 14 g/dL (ref 12.0–15.0)
Lymphocytes Relative: 45 % (ref 12–46)
Lymphs Abs: 1.8 10*3/uL (ref 0.7–4.0)
MCH: 29.6 pg (ref 26.0–34.0)
MCHC: 32.6 g/dL (ref 30.0–36.0)
MCV: 90.7 fL (ref 78.0–100.0)
MONOS PCT: 8 % (ref 3–12)
MPV: 9.5 fL (ref 8.6–12.4)
Monocytes Absolute: 0.3 10*3/uL (ref 0.1–1.0)
NEUTROS ABS: 1.9 10*3/uL (ref 1.7–7.7)
Neutrophils Relative %: 47 % (ref 43–77)
PLATELETS: 220 10*3/uL (ref 150–400)
RBC: 4.73 MIL/uL (ref 3.87–5.11)
RDW: 13.2 % (ref 11.5–15.5)
WBC: 4 10*3/uL (ref 4.0–10.5)

## 2014-07-25 MED ORDER — ALBUTEROL SULFATE HFA 108 (90 BASE) MCG/ACT IN AERS: 2.0000 | INHALATION_SPRAY | Freq: Four times a day (QID) | RESPIRATORY_TRACT | Status: DC | PRN

## 2014-07-25 MED ORDER — PREDNISONE 10 MG PO TABS: ORAL_TABLET | ORAL | Status: DC

## 2014-07-25 NOTE — Patient Instructions (Addendum)
Try the inhaler as needed for SOB.   If no improvement with the inhaler- start the prednisone in the morning.   Prednisone with breakfast  6 tablets on day 1, 5 tablets on day 2, 4 tablets on day 3, 3 tablets on day 4, 2 tablets day 5, 1 tablet on day 6...done!

## 2014-07-25 NOTE — Progress Notes (Signed)
   Subjective:    [name], female    DOB: [birth year], 81 y.o.   MRN: [id number]  HPI  Kathy Dominguez 81 yo female with a CC fatigue.   1) Tired and "short winded", started this week she believes, leaving for [place] next month.   No inhaler, no wheezing reports  Review of Systems  Constitutional: Positive for fatigue. Negative for fever, chills and diaphoresis.  HENT: Negative for trouble swallowing.   Eyes: Negative for visual disturbance.  Respiratory: Positive for cough, chest tightness and shortness of breath. Negative for wheezing.   Cardiovascular: Negative for chest pain, palpitations and leg swelling.  Gastrointestinal: Negative for nausea, vomiting, diarrhea and abdominal distention.  Skin: Negative for rash.       Objective:   Physical Exam  Constitutional: She is oriented to person, place, and time. She appears well-developed and well-nourished. No distress.  BP 108/76 mmHg  Pulse 75  Temp(Src) 98 F (36.7 C) (Oral)  Resp 12  Ht 5\' 3"  (1.6 m)  Wt 147 lb 6.4 oz (66.86 kg)  BMI 26.12 kg/m2  SpO2 94%  LMP 02/21/1966   HENT:  Head: Normocephalic and atraumatic.  Right Ear: External ear normal.  Left Ear: External ear normal.  Cardiovascular: Normal rate, regular rhythm and normal heart sounds.  Exam reveals no gallop and no friction rub.   No murmur heard. Pulmonary/Chest: Effort normal and breath sounds normal. No respiratory distress. She has no wheezes. She has no rales. She exhibits no tenderness.  Neurological: She is alert and oriented to person, place, and time. No cranial nerve deficit. She exhibits normal muscle tone. Coordination normal.  Skin: Skin is warm and dry. No rash noted. She is not diaphoretic.  Psychiatric: She has a normal mood and affect. Her behavior is normal. Judgment and thought content normal.       Assessment & Plan:  Cbc w/ diff and bmet r/ out infection and electrolyte-kidney issues  No changes in EKG from 2013

## 2014-07-25 NOTE — Progress Notes (Signed)
Pre visit review using our clinic review tool, if applicable. No additional management support is needed unless otherwise documented below in the visit note. 

## 2014-07-26 ENCOUNTER — Telehealth: Payer: Self-pay

## 2014-07-26 LAB — BASIC METABOLIC PANEL
BUN: 15 mg/dL (ref 6–23)
CO2: 32 meq/L (ref 19–32)
Calcium: 9.3 mg/dL (ref 8.4–10.5)
Chloride: 98 mEq/L (ref 96–112)
Creat: 0.67 mg/dL (ref 0.50–1.10)
Glucose, Bld: 66 mg/dL — ABNORMAL LOW (ref 70–99)
Potassium: 3.8 mEq/L (ref 3.5–5.3)
SODIUM: 142 meq/L (ref 135–145)

## 2014-07-26 NOTE — Telephone Encounter (Signed)
-----   Message from Rubbie Battiest, NP sent at 07/26/2014 11:19 AM EDT ----- Please call and check on [name]. Thanks! (SOB and fatigue were her symptoms)

## 2014-07-26 NOTE — Telephone Encounter (Signed)
LMTCB

## 2014-08-01 NOTE — Telephone Encounter (Signed)
Pt stated she is feeling much better. She is not as tired and her breathing is better. She is going to take an extra inhaler with her on her trip just in case.

## 2014-08-07 DIAGNOSIS — R0602 Shortness of breath: Secondary | ICD-10-CM | POA: Insufficient documentation

## 2014-08-07 DIAGNOSIS — R5383 Other fatigue: Secondary | ICD-10-CM | POA: Insufficient documentation

## 2014-08-07 NOTE — Assessment & Plan Note (Addendum)
Pt had EKG today. No changes from 2013 EKG and negative for ST changes. Will obtain CBC w. Diff and Bmet to r/ out infection and electrolyte/kidney issues.   Started on albuterol inhaler and prednisone taper given in case of albuterol not working. Will follow.

## 2014-08-07 NOTE — Assessment & Plan Note (Signed)
See SOB for further details. EKG obtained today. No significant changes from 2013 comparison.

## 2014-10-09 ENCOUNTER — Other Ambulatory Visit: Payer: Self-pay

## 2014-10-09 MED ORDER — AMLODIPINE BESYLATE 10 MG PO TABS: 10.0000 mg | ORAL_TABLET | Freq: Every day | ORAL | Status: DC

## 2014-10-15 ENCOUNTER — Encounter: Payer: Self-pay | Admitting: Internal Medicine

## 2014-10-15 ENCOUNTER — Ambulatory Visit (INDEPENDENT_AMBULATORY_CARE_PROVIDER_SITE_OTHER): Payer: Medicare Other | Admitting: Internal Medicine

## 2014-10-15 VITALS — BP 130/80 | HR 79 | Temp 98.0°F | Resp 18 | Ht 63.0 in | Wt 146.8 lb

## 2014-10-15 DIAGNOSIS — J4520 Mild intermittent asthma, uncomplicated: Secondary | ICD-10-CM

## 2014-10-15 DIAGNOSIS — I10 Essential (primary) hypertension: Secondary | ICD-10-CM | POA: Diagnosis not present

## 2014-10-15 DIAGNOSIS — C189 Malignant neoplasm of colon, unspecified: Secondary | ICD-10-CM | POA: Diagnosis not present

## 2014-10-15 DIAGNOSIS — E7800 Pure hypercholesterolemia, unspecified: Secondary | ICD-10-CM

## 2014-10-15 DIAGNOSIS — D72819 Decreased white blood cell count, unspecified: Secondary | ICD-10-CM

## 2014-10-15 LAB — LIPID PANEL
CHOLESTEROL: 219 mg/dL — AB (ref 0–200)
HDL: 72.1 mg/dL (ref >39.00)
LDL CALC: 135 mg/dL — AB (ref 0–99)
NonHDL: 147.09
Total CHOL/HDL Ratio: 3
Triglycerides: 61 mg/dL (ref 0.0–149.0)
VLDL: 12.2 mg/dL (ref 0.0–40.0)

## 2014-10-15 LAB — BASIC METABOLIC PANEL
BUN: 13 mg/dL (ref 6–23)
CO2: 32 mEq/L (ref 19–32)
Calcium: 9.2 mg/dL (ref 8.4–10.5)
Chloride: 103 mEq/L (ref 96–112)
Creatinine, Ser: 0.67 mg/dL (ref 0.40–1.20)
GFR: 107.69 mL/min (ref >60.00)
GLUCOSE: 93 mg/dL (ref 70–99)
Potassium: 4.1 mEq/L (ref 3.5–5.1)
Sodium: 142 mEq/L (ref 135–145)

## 2014-10-15 LAB — HEPATIC FUNCTION PANEL
ALT: 9 U/L (ref 0–35)
AST: 14 U/L (ref 0–37)
Albumin: 4.1 g/dL (ref 3.5–5.2)
Alkaline Phosphatase: 78 U/L (ref 39–117)
Bilirubin, Direct: 0.1 mg/dL (ref 0.0–0.3)
Total Bilirubin: 0.6 mg/dL (ref 0.2–1.2)
Total Protein: 6.9 g/dL (ref 6.0–8.3)

## 2014-10-15 LAB — CEA: CEA: 2.6 ng/mL (ref 0.0–5.0)

## 2014-10-15 NOTE — Assessment & Plan Note (Signed)
Blood pressure under good control.  Continue same medication regimen.  Follow pressures.  Follow metabolic panel.   

## 2014-10-15 NOTE — Addendum Note (Signed)
Addended by: Karlene Einstein D on: 10/15/2014 08:39 AM   Modules accepted: Orders

## 2014-10-15 NOTE — Assessment & Plan Note (Signed)
Last white blood cell count wnl.   

## 2014-10-15 NOTE — Progress Notes (Signed)
Patient ID: Alta Goding, female   DOB: [birth year], 81 y.o.   MRN: [id number]   Subjective:    Patient ID: [name], female    DOB: 1929-01-25, 81 y.o.   MRN: [id number]  HPI  Patient with a past history of hypertension, asthma and hypercholesterolemia.  Here today to follow up on these issues.  She stays active.  States her breathing is dong well.  No sob.  Went to [place] recently.  No cardiac symptoms with increased activity or exertion.  No acid reflux reported.  No abdominal pain or cramping.  Bowels stable.  Has been released by the cancer center.  Seeing Dr Bary Castilla for her breast.  Had a fall recently.  She was walking with another lady and the lady fell.  She was trying to catch her.  No residual problems or injuries.     Past Medical History  Diagnosis Date  . Hypercholesterolemia   . Hypertension   . Osteoporosis    Past Surgical History  Procedure Laterality Date  . Thyroidectomy, partial  1956  . Abdominal hysterectomy    . Tubal ligation     Family History  Problem Relation Age of Onset  . Stroke Mother   . Colon cancer Father   . Breast cancer Daughter 61  . Breast cancer Cousin    Social History   Social History  . Marital Status: Widowed    Spouse Name: N/A  . Number of Children: N/A  . Years of Education: N/A   Social History Main Topics  . Smoking status: Never Smoker   . Smokeless tobacco: Never Used  . Alcohol Use: No  . Drug Use: No  . Sexual Activity: Not Asked   Other Topics Concern  . None   Social History Narrative    Outpatient Encounter Prescriptions as of 10/15/2014  Medication Sig  . albuterol (PROVENTIL HFA;VENTOLIN HFA) 108 (90 BASE) MCG/ACT inhaler Inhale 2 puffs into the lungs every 6 (six) hours as needed for wheezing or shortness of breath.  Marland Kitchen amLODipine (NORVASC) 10 MG tablet Take 1 tablet (10 mg total) by mouth daily.  Marland Kitchen aspirin (ASPIRIN EC) 81 MG EC tablet Take 81 mg by mouth daily. Swallow whole.  . Cholecalciferol  (VITAMIN D-3) 1000 UNITS CAPS Take 2,000 Units by mouth daily.  Marland Kitchen telmisartan (MICARDIS) 80 MG tablet Take 1 tablet (80 mg total) by mouth daily.  . [DISCONTINUED] predniSONE (DELTASONE) 10 MG tablet Take 6 tablets by mouth on day 1 with breakfast then decrease by 1 tablet each day until gone.   No facility-administered encounter medications on file as of 10/15/2014.    Review of Systems  Constitutional: Negative for appetite change and unexpected weight change.  HENT: Negative for congestion and sinus pressure.   Respiratory: Negative for cough, chest tightness and shortness of breath.   Cardiovascular: Negative for chest pain, palpitations and leg swelling.  Gastrointestinal: Negative for nausea, vomiting, abdominal pain and diarrhea.  Musculoskeletal: Negative for back pain.       No pain from the fall.   Skin: Negative for color change and rash.  Neurological: Negative for dizziness, light-headedness and headaches.  Psychiatric/Behavioral: Negative for dysphoric mood and agitation.       Objective:     Blood pressure rechecked by me:  118/72  Physical Exam  Constitutional: She appears well-developed and well-nourished. No distress.  HENT:  Nose: Nose normal.  Mouth/Throat: Oropharynx is clear and moist.  Eyes: Conjunctivae are normal. Right eye exhibits no  discharge. Left eye exhibits no discharge.  Neck: Neck supple. No thyromegaly present.  Cardiovascular: Normal rate and regular rhythm.   Pulmonary/Chest: Breath sounds normal. No respiratory distress. She has no wheezes.  Abdominal: Soft. Bowel sounds are normal. There is no tenderness.  Musculoskeletal: She exhibits no edema or tenderness.  Lymphadenopathy:    She has no cervical adenopathy.  Skin: No rash noted. No erythema.  Psychiatric: She has a normal mood and affect. Her behavior is normal.    BP 130/80 mmHg  Pulse 79  Temp(Src) 98 F (36.7 C) (Oral)  Resp 18  Ht 5\' 3"  (1.6 m)  Wt 146 lb 12 oz (66.565 kg)   BMI 26.00 kg/m2  SpO2 94%  LMP 02/21/1966 Wt Readings from Last 3 Encounters:  10/15/14 146 lb 12 oz (66.565 kg)  07/25/14 147 lb 6.4 oz (66.86 kg)  06/13/14 147 lb 2 oz (66.735 kg)     Lab Results  Component Value Date   WBC 4.0 07/25/2014   HGB 14.0 07/25/2014   HCT 42.9 07/25/2014   PLT 220 07/25/2014   GLUCOSE 66* 07/25/2014   CHOL 206* 06/13/2014   TRIG 36.0 06/13/2014   HDL 81.60 06/13/2014   LDLDIRECT 128.3 02/08/2013   LDLCALC 117* 06/13/2014   ALT 12 06/13/2014   AST 19 06/13/2014   NA 142 07/25/2014   K 3.8 07/25/2014   CL 98 07/25/2014   CREATININE 0.67 07/25/2014   BUN 15 07/25/2014   CO2 32 07/25/2014   TSH 0.51 06/13/2014   HGBA1C 6.1 10/10/2013       Assessment & Plan:   Problem List Items Addressed This Visit    Asthma    Breathing stable.  No sob.        Colon adenocarcinoma - Primary    Colonoscopy 06/08/12 diverticulosis otherwise normal.  Has been released by cancer center.  Check CEA.       Relevant Orders   CEA   Hypercholesterolemia    Low cholesterol diet and exercise.  Follow lipid panel.        Relevant Orders   Lipid panel   Hepatic function panel   Hypertension    Blood pressure under good control.  Continue same medication regimen.  Follow pressures.  Follow metabolic panel.        Relevant Orders   Basic metabolic panel   Leukopenia    Last white blood cell count wnl.            Einar Pheasant, MD

## 2014-10-15 NOTE — Assessment & Plan Note (Signed)
Colonoscopy 06/08/12 diverticulosis otherwise normal.  Has been released by cancer center.  Check CEA.

## 2014-10-15 NOTE — Progress Notes (Signed)
Pre-visit discussion using our clinic review tool. No additional management support is needed unless otherwise documented below in the visit note.  

## 2014-10-15 NOTE — Assessment & Plan Note (Signed)
Breathing stable.  No sob.   

## 2014-10-15 NOTE — Assessment & Plan Note (Signed)
Low cholesterol diet and exercise.  Follow lipid panel.   

## 2014-10-16 ENCOUNTER — Other Ambulatory Visit: Payer: Self-pay | Admitting: *Deleted

## 2014-10-16 MED ORDER — PRAVASTATIN SODIUM 10 MG PO TABS: 10.0000 mg | ORAL_TABLET | Freq: Every day | ORAL | Status: DC

## 2014-11-27 ENCOUNTER — Other Ambulatory Visit (INDEPENDENT_AMBULATORY_CARE_PROVIDER_SITE_OTHER): Payer: Medicare Other

## 2014-11-27 ENCOUNTER — Other Ambulatory Visit: Payer: Self-pay | Admitting: Internal Medicine

## 2014-11-27 ENCOUNTER — Telehealth: Payer: Self-pay | Admitting: *Deleted

## 2014-11-27 DIAGNOSIS — E7800 Pure hypercholesterolemia, unspecified: Secondary | ICD-10-CM

## 2014-11-27 DIAGNOSIS — I10 Essential (primary) hypertension: Secondary | ICD-10-CM

## 2014-11-27 LAB — HEPATIC FUNCTION PANEL
ALBUMIN: 4 g/dL (ref 3.5–5.2)
ALK PHOS: 76 U/L (ref 39–117)
ALT: 9 U/L (ref 0–35)
AST: 15 U/L (ref 0–37)
BILIRUBIN TOTAL: 0.5 mg/dL (ref 0.2–1.2)
Bilirubin, Direct: 0.1 mg/dL (ref 0.0–0.3)
Total Protein: 6.6 g/dL (ref 6.0–8.3)

## 2014-11-27 NOTE — Telephone Encounter (Signed)
Order placed for liver panel.  

## 2014-11-27 NOTE — Progress Notes (Signed)
Order placed for f/u labs.  

## 2014-11-27 NOTE — Telephone Encounter (Signed)
Labs and dx?  

## 2014-11-28 ENCOUNTER — Encounter: Payer: Self-pay | Admitting: *Deleted

## 2014-12-10 ENCOUNTER — Other Ambulatory Visit: Payer: Self-pay

## 2014-12-10 MED ORDER — TELMISARTAN 80 MG PO TABS: 80.0000 mg | ORAL_TABLET | Freq: Every day | ORAL | Status: DC

## 2015-01-28 ENCOUNTER — Telehealth: Payer: Self-pay | Admitting: Internal Medicine

## 2015-01-28 NOTE — Telephone Encounter (Signed)
Pt called stating she received a letter to get lab work done to check her cholesterol. Need order please and thank You!

## 2015-01-28 NOTE — Telephone Encounter (Signed)
Orders are in the system.  Please schedule a fasting lab appt 1-2 days before her 02/20/15 appt.  Thanks

## 2015-01-28 NOTE — Telephone Encounter (Signed)
Please advise for labs

## 2015-02-18 ENCOUNTER — Other Ambulatory Visit (INDEPENDENT_AMBULATORY_CARE_PROVIDER_SITE_OTHER): Payer: Medicare HMO

## 2015-02-18 DIAGNOSIS — E7800 Pure hypercholesterolemia, unspecified: Secondary | ICD-10-CM | POA: Diagnosis not present

## 2015-02-18 DIAGNOSIS — I10 Essential (primary) hypertension: Secondary | ICD-10-CM

## 2015-02-18 LAB — LIPID PANEL
CHOL/HDL RATIO: 3
Cholesterol: 219 mg/dL — ABNORMAL HIGH (ref 0–200)
HDL: 75.2 mg/dL (ref >39.00)
LDL CALC: 131 mg/dL — AB (ref 0–99)
NonHDL: 143.84
TRIGLYCERIDES: 62 mg/dL (ref 0.0–149.0)
VLDL: 12.4 mg/dL (ref 0.0–40.0)

## 2015-02-18 LAB — BASIC METABOLIC PANEL
BUN: 15 mg/dL (ref 6–23)
CHLORIDE: 102 meq/L (ref 96–112)
CO2: 34 mEq/L — ABNORMAL HIGH (ref 19–32)
CREATININE: 0.75 mg/dL (ref 0.40–1.20)
Calcium: 9.2 mg/dL (ref 8.4–10.5)
GFR: 94.46 mL/min (ref >60.00)
Glucose, Bld: 94 mg/dL (ref 70–99)
POTASSIUM: 4.2 meq/L (ref 3.5–5.1)
Sodium: 143 mEq/L (ref 135–145)

## 2015-02-18 LAB — HEPATIC FUNCTION PANEL
ALBUMIN: 4.2 g/dL (ref 3.5–5.2)
ALK PHOS: 79 U/L (ref 39–117)
ALT: 11 U/L (ref 0–35)
AST: 15 U/L (ref 0–37)
Bilirubin, Direct: 0.1 mg/dL (ref 0.0–0.3)
TOTAL PROTEIN: 6.8 g/dL (ref 6.0–8.3)
Total Bilirubin: 0.5 mg/dL (ref 0.2–1.2)

## 2015-02-20 ENCOUNTER — Encounter: Payer: Self-pay | Admitting: Internal Medicine

## 2015-02-20 ENCOUNTER — Ambulatory Visit (INDEPENDENT_AMBULATORY_CARE_PROVIDER_SITE_OTHER): Payer: Medicare HMO | Admitting: Internal Medicine

## 2015-02-20 VITALS — BP 120/80 | HR 92 | Temp 97.6°F | Resp 18 | Ht 63.0 in | Wt 145.3 lb

## 2015-02-20 DIAGNOSIS — Z0000 Encounter for general adult medical examination without abnormal findings: Secondary | ICD-10-CM | POA: Diagnosis not present

## 2015-02-20 DIAGNOSIS — N63 Unspecified lump in unspecified breast: Secondary | ICD-10-CM

## 2015-02-20 DIAGNOSIS — E7800 Pure hypercholesterolemia, unspecified: Secondary | ICD-10-CM

## 2015-02-20 DIAGNOSIS — R3915 Urgency of urination: Secondary | ICD-10-CM

## 2015-02-20 DIAGNOSIS — D72819 Decreased white blood cell count, unspecified: Secondary | ICD-10-CM

## 2015-02-20 DIAGNOSIS — J4520 Mild intermittent asthma, uncomplicated: Secondary | ICD-10-CM

## 2015-02-20 DIAGNOSIS — Z23 Encounter for immunization: Secondary | ICD-10-CM

## 2015-02-20 DIAGNOSIS — C189 Malignant neoplasm of colon, unspecified: Secondary | ICD-10-CM

## 2015-02-20 DIAGNOSIS — M79604 Pain in right leg: Secondary | ICD-10-CM | POA: Diagnosis not present

## 2015-02-20 DIAGNOSIS — I10 Essential (primary) hypertension: Secondary | ICD-10-CM

## 2015-02-20 LAB — URINALYSIS, ROUTINE W REFLEX MICROSCOPIC
BILIRUBIN URINE: NEGATIVE
HGB URINE DIPSTICK: NEGATIVE
Ketones, ur: NEGATIVE
LEUKOCYTES UA: NEGATIVE
Nitrite: NEGATIVE
PH: 6 (ref 5.0–8.0)
RBC / HPF: NONE SEEN (ref 0–?)
Specific Gravity, Urine: <=1.005 — AB (ref 1.000–1.030)
TOTAL PROTEIN, URINE-UPE24: NEGATIVE
Urine Glucose: NEGATIVE
Urobilinogen, UA: 0.2 (ref 0.0–1.0)
WBC, UA: NONE SEEN (ref 0–?)

## 2015-02-20 NOTE — Progress Notes (Signed)
[name], female   DOB: 07-17-1928, 82 y.o.   MRN: [id number]   Subjective:    [name], female    DOB: 1928-05-31, 82 y.o.   MRN: [id number]  HPI  Patient with past history of hypercholesterolemia, colon adenocarcinoma and hypercholesterolemia.  She comes in today to follow up on these issues as well as for a complete physical exam.   She reports she is doing well.  Tries to stay active.  Plans to travel to [place] soon.  No chest pain or tightness.  Reports no sob.  No acid reflux.  No abdominal pain or cramping.  Bowels stable.  Does have some right knee pain and right lower lateral leg pain.  Hurts at night.  No pain with ambulation.  No pain with stairs.  Not a frequent occurrence.  No injury.  No back pain.  Some urinary urgency.  No dysuria.  Eating and drinking well.   Had leg aching with pravastatin.  Stopped the medication.    Past Medical History  Diagnosis Date  . Hypercholesterolemia   . Hypertension   . Osteoporosis    Past Surgical History  Procedure Laterality Date  . Thyroidectomy, partial  1956  . Abdominal hysterectomy    . Tubal ligation     Family History  Problem Relation Age of Onset  . Stroke Mother   . Colon cancer Father   . Breast cancer Daughter 24  . Breast cancer Cousin    Social History   Social History  . Marital Status: Widowed    Spouse Name: N/A  . Number of Children: N/A  . Years of Education: N/A   Social History Main Topics  . Smoking status: Never Smoker   . Smokeless tobacco: Never Used  . Alcohol Use: No  . Drug Use: No  . Sexual Activity: Not Asked   Other Topics Concern  . None   Social History Narrative    Outpatient Encounter Prescriptions as of 02/20/2015  Medication Sig  . albuterol (PROVENTIL HFA;VENTOLIN HFA) 108 (90 BASE) MCG/ACT inhaler Inhale 2 puffs into the lungs every 6 (six) hours as needed for wheezing or shortness of breath.  Marland Kitchen amLODipine (NORVASC) 10 MG tablet Take 1 tablet  (10 mg total) by mouth daily.  Marland Kitchen aspirin (ASPIRIN EC) 81 MG EC tablet Take 81 mg by mouth daily. Swallow whole.  . Cholecalciferol (VITAMIN D-3) 1000 UNITS CAPS Take 2,000 Units by mouth daily.  Marland Kitchen telmisartan (MICARDIS) 80 MG tablet Take 1 tablet (80 mg total) by mouth daily.  . [DISCONTINUED] pravastatin (PRAVACHOL) 10 MG tablet Take 1 tablet (10 mg total) by mouth daily.   No facility-administered encounter medications on file as of 02/20/2015.    Review of Systems  Constitutional: Negative for appetite change and unexpected weight change.  HENT: Negative for congestion and sinus pressure.   Eyes: Negative for pain and visual disturbance.  Respiratory: Negative for cough, chest tightness and shortness of breath.   Cardiovascular: Negative for chest pain, palpitations and leg swelling.  Gastrointestinal: Negative for nausea, vomiting, abdominal pain and diarrhea.  Genitourinary: Negative for dysuria and difficulty urinating.  Musculoskeletal: Negative for back pain and joint swelling.       Right knee pain and right lower leg pain as outlined.    Skin: Negative for color change and rash.  Neurological: Negative for dizziness, light-headedness and headaches.  Hematological: Negative for adenopathy. Does not bruise/bleed easily.  Psychiatric/Behavioral: Negative for dysphoric mood and agitation.  Objective:    Physical Exam  Constitutional: She is oriented to person, place, and time. She appears well-developed and well-nourished. No distress.  HENT:  Nose: Nose normal.  Mouth/Throat: Oropharynx is clear and moist.  Eyes: Right eye exhibits no discharge. Left eye exhibits no discharge. No scleral icterus.  Neck: Neck supple. No thyromegaly present.  Cardiovascular: Normal rate and regular rhythm.   Pulmonary/Chest: Breath sounds normal. No accessory muscle usage. No tachypnea. No respiratory distress. She has no decreased breath sounds. She has no wheezes. She has no rhonchi.  Right breast exhibits no inverted nipple, no mass, no nipple discharge and no tenderness (no axillary adenopathy). Left breast exhibits no inverted nipple, no mass, no nipple discharge and no tenderness (no axilarry adenopathy).  Abdominal: Soft. Bowel sounds are normal. There is no tenderness.  Musculoskeletal: She exhibits no edema or tenderness.  No pain to palpation over the knee.  No pain with flexion and extension of the knee and no pain against resistance.  No pain with weight bearing.  No calf tenderness to palpation.  No increased erythema or warmth.   Lymphadenopathy:    She has no cervical adenopathy.  Neurological: She is alert and oriented to person, place, and time.  Skin: Skin is warm. No rash noted. No erythema.  Psychiatric: She has a normal mood and affect. Her behavior is normal.    BP 120/80 mmHg  Pulse 92  Temp(Src) 97.6 F (36.4 C) (Oral)  Resp 18  Ht 5\' 3"  (1.6 m)  Wt 145 lb 5 oz (65.913 kg)  BMI 25.75 kg/m2  SpO2 94%  LMP 02/21/1966 Wt Readings from Last 3 Encounters:  02/20/15 145 lb 5 oz (65.913 kg)  10/15/14 146 lb 12 oz (66.565 kg)  07/25/14 147 lb 6.4 oz (66.86 kg)     Lab Results  Component Value Date   WBC 4.0 07/25/2014   HGB 14.0 07/25/2014   HCT 42.9 07/25/2014   PLT 220 07/25/2014   GLUCOSE 94 02/18/2015   CHOL 219* 02/18/2015   TRIG 62.0 02/18/2015   HDL 75.20 02/18/2015   LDLDIRECT 128.3 02/08/2013   LDLCALC 131* 02/18/2015   ALT 11 02/18/2015   AST 15 02/18/2015   NA 143 02/18/2015   K 4.2 02/18/2015   CL 102 02/18/2015   CREATININE 0.75 02/18/2015   BUN 15 02/18/2015   CO2 34* 02/18/2015   TSH 0.51 06/13/2014   HGBA1C 6.1 10/10/2013       Assessment & Plan:   Problem List Items Addressed This Visit    Asthma    Breathing stable.  No increased sob.        Colon adenocarcinoma ([place])    Colonoscopy 06/08/12.  CEA 10/15/14 - 2.6.        Health care maintenance    Physical 02/20/15.  S/p hysterectomy.  Seeing Dr Kathy Dominguez  for her breast and f/u mammogram.  Has mammogram scheduled 04/25/15.  Colonoscopy 06/08/12 - ok.        Hypercholesterolemia    Had leg aching with pravastatin.  Will remain off cholesterol medication for now.  Discussed her labs.  Low cholesterol diet.  Will follow.   Lab Results  Component Value Date   CHOL 219* 02/18/2015   HDL 75.20 02/18/2015   LDLCALC 131* 02/18/2015   LDLDIRECT 128.3 02/08/2013   TRIG 62.0 02/18/2015   CHOLHDL 3 02/18/2015        Relevant Orders   Lipid panel   Hepatic function panel  Hypertension    Blood pressure under good control.  Continue same medication regimen.  Follow pressures.  Follow metabolic panel.        Relevant Orders   TSH   Basic metabolic panel   Leukopenia    Follow cbc.        Relevant Orders   CBC with Differential/Platelet   Lump or mass in breast    Saw Dr Kathy Dominguez.  S/p biopsy.  Has f/u mammogram planned in 03/2015.        Right leg pain    Compression hose as directed.  Follow.         Other Visit Diagnoses    Routine general medical examination at a health care facility    -  Primary    Urinary urgency        Relevant Orders    Urinalysis, Routine w reflex microscopic (not at [facility]) (Completed)    CULTURE, URINE COMPREHENSIVE (Completed)    Encounter for immunization            Einar Pheasant, MD

## 2015-02-20 NOTE — Assessment & Plan Note (Signed)
Had leg aching with pravastatin.  Will remain off cholesterol medication for now.  Discussed her labs.  Low cholesterol diet.  Will follow.   Lab Results  Component Value Date   CHOL 219* 02/18/2015   HDL 75.20 02/18/2015   LDLCALC 131* 02/18/2015   LDLDIRECT 128.3 02/08/2013   TRIG 62.0 02/18/2015   CHOLHDL 3 02/18/2015

## 2015-02-20 NOTE — Progress Notes (Signed)
Pre-visit discussion using our clinic review tool. No additional management support is needed unless otherwise documented below in the visit note.  

## 2015-02-20 NOTE — Assessment & Plan Note (Signed)
Physical 02/20/15.  S/p hysterectomy.  Seeing Dr Bary Castilla for her breast and f/u mammogram.  Has mammogram scheduled 04/25/15.  Colonoscopy 06/08/12 - ok.

## 2015-02-22 LAB — CULTURE, URINE COMPREHENSIVE
Colony Count: NO GROWTH
Organism ID, Bacteria: NO GROWTH

## 2015-02-24 ENCOUNTER — Encounter: Payer: Self-pay | Admitting: *Deleted

## 2015-02-24 ENCOUNTER — Encounter: Payer: Self-pay | Admitting: Internal Medicine

## 2015-02-24 DIAGNOSIS — M79604 Pain in right leg: Secondary | ICD-10-CM | POA: Insufficient documentation

## 2015-02-24 NOTE — Assessment & Plan Note (Signed)
Follow cbc.  

## 2015-02-24 NOTE — Assessment & Plan Note (Signed)
Compression hose as directed.  Follow.

## 2015-02-24 NOTE — Assessment & Plan Note (Signed)
Blood pressure under good control.  Continue same medication regimen.  Follow pressures.  Follow metabolic panel.   

## 2015-02-24 NOTE — Assessment & Plan Note (Signed)
Saw Dr Bary Castilla.  S/p biopsy.  Has f/u mammogram planned in 03/2015.

## 2015-02-24 NOTE — Assessment & Plan Note (Signed)
Breathing stable.  No increased sob.

## 2015-02-24 NOTE — Assessment & Plan Note (Signed)
Colonoscopy 06/08/12.  CEA 10/15/14 - 2.6.

## 2015-04-02 ENCOUNTER — Ambulatory Visit: Payer: Medicare Other | Admitting: General Surgery

## 2015-04-21 DIAGNOSIS — I8311 Varicose veins of right lower extremity with inflammation: Secondary | ICD-10-CM | POA: Diagnosis not present

## 2015-04-21 DIAGNOSIS — L910 Hypertrophic scar: Secondary | ICD-10-CM | POA: Diagnosis not present

## 2015-04-21 DIAGNOSIS — R234 Changes in skin texture: Secondary | ICD-10-CM | POA: Diagnosis not present

## 2015-04-21 DIAGNOSIS — I8312 Varicose veins of left lower extremity with inflammation: Secondary | ICD-10-CM | POA: Diagnosis not present

## 2015-04-25 ENCOUNTER — Encounter: Payer: Self-pay | Admitting: General Surgery

## 2015-04-25 DIAGNOSIS — N63 Unspecified lump in breast: Secondary | ICD-10-CM | POA: Diagnosis not present

## 2015-05-01 ENCOUNTER — Encounter: Payer: Self-pay | Admitting: General Surgery

## 2015-05-01 ENCOUNTER — Ambulatory Visit (INDEPENDENT_AMBULATORY_CARE_PROVIDER_SITE_OTHER): Payer: Medicare HMO | Admitting: General Surgery

## 2015-05-01 VITALS — BP 138/80 | HR 68 | Resp 12 | Ht 63.0 in | Wt 142.0 lb

## 2015-05-01 DIAGNOSIS — N63 Unspecified lump in unspecified breast: Secondary | ICD-10-CM

## 2015-05-01 NOTE — Patient Instructions (Signed)
The patient has been asked to return to the office in one year with a bilateral diagnostic mammogram. 

## 2015-05-01 NOTE — Progress Notes (Signed)
[name], female   DOB: March 14, 1928, 82 y.o.   MRN: [id number]  Chief Complaint  Patient presents with  . Follow-up    mammogram    HPI Kathy Dominguez is a 82 y.o. female who presents for a breast evaluation. The most recent mammogram was done on 04/25/15.  Patient does perform regular self breast checks and gets regular mammograms done.    HPI  Past Medical History  Diagnosis Date  . Hypercholesterolemia   . Hypertension   . Osteoporosis     Past Surgical History  Procedure Laterality Date  . Thyroidectomy, partial  1956  . Abdominal hysterectomy    . Tubal ligation      Family History  Problem Relation Age of Onset  . Stroke Mother   . Colon cancer Father   . Breast cancer Daughter 57  . Breast cancer Cousin     Social History Social History  Substance Use Topics  . Smoking status: Never Smoker   . Smokeless tobacco: Never Used  . Alcohol Use: No    No Known Allergies  Current Outpatient Prescriptions  Medication Sig Dispense Refill  . albuterol (PROVENTIL HFA;VENTOLIN HFA) 108 (90 BASE) MCG/ACT inhaler Inhale 2 puffs into the lungs every 6 (six) hours as needed for wheezing or shortness of breath. 1 Inhaler 2  . amLODipine (NORVASC) 10 MG tablet Take 1 tablet (10 mg total) by mouth daily. 30 tablet 5  . aspirin (ASPIRIN EC) 81 MG EC tablet Take 81 mg by mouth daily. Swallow whole.    . Cholecalciferol (VITAMIN D-3) 1000 UNITS CAPS Take 2,000 Units by mouth daily.    Marland Kitchen telmisartan (MICARDIS) 80 MG tablet Take 1 tablet (80 mg total) by mouth daily. 90 tablet 1   No current facility-administered medications for this visit.    Review of Systems Review of Systems  Constitutional: Negative.   Respiratory: Negative.   Cardiovascular: Negative.     Blood pressure 138/80, pulse 68, resp. rate 12, height 5\' 3"  (1.6 m), weight 142 lb (64.411 kg), last menstrual period 02/21/1966.  Physical Exam Physical Exam  Constitutional: She is oriented to  person, place, and time. She appears well-developed and well-nourished.  Eyes: Conjunctivae are normal. No scleral icterus.  Neck: Neck supple.  Cardiovascular: Normal rate, regular rhythm and normal heart sounds.   Pulmonary/Chest: Effort normal and breath sounds normal. Right breast exhibits no inverted nipple, no mass, no nipple discharge, no skin change and no tenderness. Left breast exhibits no inverted nipple, no mass, no nipple discharge, no skin change and no tenderness.  Abdominal: Soft. Bowel sounds are normal. There is no tenderness.  Lymphadenopathy:    She has no cervical adenopathy.    She has no axillary adenopathy.  Neurological: She is alert and oriented to person, place, and time.  Skin: Skin is warm and dry.    Data Reviewed Bilateral diagnostic mammograms dated 04/25/2015 were reviewed. No interval change. BI-RADS 3, one year follow-up recommended.  Assessment    Benign breast exam.    Plan       The patient has been asked to return to the office in one year with a bilateral diagnostic mammogram.  PCP:  Einar Pheasant This information has been scribed by Gaspar Cola CMA.   Robert Bellow 05/01/2015, 2:33 PM

## 2015-05-16 DIAGNOSIS — H43821 Vitreomacular adhesion, right eye: Secondary | ICD-10-CM | POA: Diagnosis not present

## 2015-06-17 ENCOUNTER — Other Ambulatory Visit (INDEPENDENT_AMBULATORY_CARE_PROVIDER_SITE_OTHER): Payer: Medicare HMO

## 2015-06-17 DIAGNOSIS — E7800 Pure hypercholesterolemia, unspecified: Secondary | ICD-10-CM

## 2015-06-17 DIAGNOSIS — I10 Essential (primary) hypertension: Secondary | ICD-10-CM | POA: Diagnosis not present

## 2015-06-17 DIAGNOSIS — D72819 Decreased white blood cell count, unspecified: Secondary | ICD-10-CM

## 2015-06-17 LAB — BASIC METABOLIC PANEL
BUN: 19 mg/dL (ref 6–23)
CALCIUM: 9.4 mg/dL (ref 8.4–10.5)
CHLORIDE: 102 meq/L (ref 96–112)
CO2: 32 mEq/L (ref 19–32)
CREATININE: 0.69 mg/dL (ref 0.40–1.20)
GFR: 103.93 mL/min (ref >60.00)
Glucose, Bld: 91 mg/dL (ref 70–99)
Potassium: 4 mEq/L (ref 3.5–5.1)
Sodium: 140 mEq/L (ref 135–145)

## 2015-06-17 LAB — CBC WITH DIFFERENTIAL/PLATELET
BASOS ABS: 0 10*3/uL (ref 0.0–0.1)
Basophils Relative: 0.6 % (ref 0.0–3.0)
Eosinophils Absolute: 0 10*3/uL (ref 0.0–0.7)
Eosinophils Relative: 0.1 % (ref 0.0–5.0)
HEMATOCRIT: 41.2 % (ref 36.0–46.0)
Hemoglobin: 13.6 g/dL (ref 12.0–15.0)
LYMPHS ABS: 1.5 10*3/uL (ref 0.7–4.0)
LYMPHS PCT: 39.8 % (ref 12.0–46.0)
MCHC: 33.2 g/dL (ref 30.0–36.0)
MCV: 90.7 fl (ref 78.0–100.0)
MONOS PCT: 12.4 % — AB (ref 3.0–12.0)
Monocytes Absolute: 0.5 10*3/uL (ref 0.1–1.0)
NEUTROS PCT: 47.1 % (ref 43.0–77.0)
Neutro Abs: 1.8 10*3/uL (ref 1.4–7.7)
Platelets: 255 10*3/uL (ref 150.0–400.0)
RBC: 4.54 Mil/uL (ref 3.87–5.11)
RDW: 13.8 % (ref 11.5–15.5)
WBC: 3.9 10*3/uL — ABNORMAL LOW (ref 4.0–10.5)

## 2015-06-17 LAB — HEPATIC FUNCTION PANEL
ALBUMIN: 4.1 g/dL (ref 3.5–5.2)
ALK PHOS: 69 U/L (ref 39–117)
ALT: 15 U/L (ref 0–35)
AST: 19 U/L (ref 0–37)
Bilirubin, Direct: 0.1 mg/dL (ref 0.0–0.3)
TOTAL PROTEIN: 6.9 g/dL (ref 6.0–8.3)
Total Bilirubin: 0.6 mg/dL (ref 0.2–1.2)

## 2015-06-17 LAB — TSH: TSH: 0.74 u[IU]/mL (ref 0.35–4.50)

## 2015-06-17 LAB — LIPID PANEL
CHOL/HDL RATIO: 3
Cholesterol: 223 mg/dL — ABNORMAL HIGH (ref 0–200)
HDL: 76.5 mg/dL (ref >39.00)
LDL Cholesterol: 139 mg/dL — ABNORMAL HIGH (ref 0–99)
NonHDL: 146.66
TRIGLYCERIDES: 38 mg/dL (ref 0.0–149.0)
VLDL: 7.6 mg/dL (ref 0.0–40.0)

## 2015-06-25 ENCOUNTER — Ambulatory Visit
Admission: RE | Admit: 2015-06-25 | Discharge: 2015-06-25 | Disposition: A | Discharge status: Home / Self Care | Payer: Medicare HMO | Source: Ambulatory Visit | Attending: Internal Medicine | Admitting: Internal Medicine

## 2015-06-25 ENCOUNTER — Ambulatory Visit (INDEPENDENT_AMBULATORY_CARE_PROVIDER_SITE_OTHER): Payer: Medicare HMO | Admitting: Internal Medicine

## 2015-06-25 ENCOUNTER — Encounter: Payer: Self-pay | Admitting: Internal Medicine

## 2015-06-25 VITALS — BP 110/70 | HR 93 | Temp 97.7°F | Resp 18 | Ht 63.0 in | Wt 145.0 lb

## 2015-06-25 DIAGNOSIS — J4520 Mild intermittent asthma, uncomplicated: Secondary | ICD-10-CM

## 2015-06-25 DIAGNOSIS — I739 Peripheral vascular disease, unspecified: Secondary | ICD-10-CM | POA: Diagnosis not present

## 2015-06-25 DIAGNOSIS — N63 Unspecified lump in unspecified breast: Secondary | ICD-10-CM

## 2015-06-25 DIAGNOSIS — E7800 Pure hypercholesterolemia, unspecified: Secondary | ICD-10-CM

## 2015-06-25 DIAGNOSIS — M25561 Pain in right knee: Secondary | ICD-10-CM | POA: Insufficient documentation

## 2015-06-25 DIAGNOSIS — M11261 Other chondrocalcinosis, right knee: Secondary | ICD-10-CM | POA: Insufficient documentation

## 2015-06-25 DIAGNOSIS — D72819 Decreased white blood cell count, unspecified: Secondary | ICD-10-CM

## 2015-06-25 DIAGNOSIS — M8580 Other specified disorders of bone density and structure, unspecified site: Secondary | ICD-10-CM | POA: Diagnosis not present

## 2015-06-25 DIAGNOSIS — M2341 Loose body in knee, right knee: Secondary | ICD-10-CM | POA: Diagnosis not present

## 2015-06-25 DIAGNOSIS — I10 Essential (primary) hypertension: Secondary | ICD-10-CM

## 2015-06-25 DIAGNOSIS — M179 Osteoarthritis of knee, unspecified: Secondary | ICD-10-CM | POA: Diagnosis not present

## 2015-06-25 NOTE — Assessment & Plan Note (Signed)
She has not been taking her blood pressure medication on a regular basis.  States may average 2-3x/week.  Took this am and prior - had not had since last week.  Will stop the amlodipine.  Take micardis on a regular basis.  Follow pressures.  Get her back in to reassess.  Recent metabolic panel wnl.

## 2015-06-25 NOTE — Assessment & Plan Note (Signed)
Feels her breathing is stable.  Declines any further testing.

## 2015-06-25 NOTE — Assessment & Plan Note (Signed)
Discussed recent cholesterol labs.  LDL 139.  She declines medication.  Follow low cholesterol diet.

## 2015-06-25 NOTE — Assessment & Plan Note (Signed)
Had mammogram 04/25/15 - Birads III.  Recommended f/u mammogram in one year.  Saw Dr Bary Castilla 05/01/15.  Recommended f/u mammogram in one year.

## 2015-06-25 NOTE — Assessment & Plan Note (Signed)
Tylenol arthritis two tablets twice a day as needed.  Check xray.  May need ortho referral.

## 2015-06-25 NOTE — Progress Notes (Signed)
[name], female   DOB: 09-25-1928, 82 y.o.   MRN: [id number]   Subjective:    [name], female    DOB: 1928/05/05, 82 y.o.   MRN: [id number]  HPI  Patient here for a scheduled follow up.  States she is doing well.  Keeping her grandchildren.  Enjoy this.  Reports no increased stress.  Overall feels her breathing is stable.  No chest pain.  No abdominal pain or cramping.  Bowels stable.  Main complaint is that of right knee pain.  Worsening for one month.  Worse at times.  Denies any injury or trauma.  No swelling.  No significant pain with ambulation.     Past Medical History  Diagnosis Date  . Hypercholesterolemia   . Hypertension   . Osteoporosis    Past Surgical History  Procedure Laterality Date  . Thyroidectomy, partial  1956  . Abdominal hysterectomy    . Tubal ligation     Family History  Problem Relation Age of Onset  . Stroke Mother   . Colon cancer Father   . Breast cancer Daughter 22  . Breast cancer Cousin    Social History   Social History  . Marital Status: Widowed    Spouse Name: N/A  . Number of Children: N/A  . Years of Education: N/A   Social History Main Topics  . Smoking status: Never Smoker   . Smokeless tobacco: Never Used  . Alcohol Use: No  . Drug Use: No  . Sexual Activity: Not Asked   Other Topics Concern  . None   Social History Narrative    Outpatient Encounter Prescriptions as of 06/25/2015  Medication Sig  . albuterol (PROVENTIL HFA;VENTOLIN HFA) 108 (90 BASE) MCG/ACT inhaler Inhale 2 puffs into the lungs every 6 (six) hours as needed for wheezing or shortness of breath.  Marland Kitchen aspirin (ASPIRIN EC) 81 MG EC tablet Take 81 mg by mouth daily. Swallow whole.  . Cholecalciferol (VITAMIN D-3) 1000 UNITS CAPS Take 2,000 Units by mouth daily.  Marland Kitchen telmisartan (MICARDIS) 80 MG tablet Take 1 tablet (80 mg total) by mouth daily.  . [DISCONTINUED] amLODipine (NORVASC) 10 MG tablet Take 1 tablet (10 mg total) by  mouth daily.   No facility-administered encounter medications on file as of 06/25/2015.    Review of Systems  Constitutional: Negative for appetite change and unexpected weight change.  HENT: Negative for congestion and sinus pressure.   Respiratory: Negative for cough and chest tightness.        Feels breathing is stable.    Cardiovascular: Negative for chest pain, palpitations and leg swelling.  Gastrointestinal: Negative for nausea, vomiting, abdominal pain and diarrhea.  Genitourinary: Negative for dysuria and difficulty urinating.  Musculoskeletal: Negative for back pain.       Right knee pain.  Present x one month.  No injury or trauma.   Skin: Negative for color change and rash.  Neurological: Negative for dizziness, light-headedness and headaches.  Psychiatric/Behavioral: Negative for dysphoric mood and agitation.       Objective:     Blood pressure rechecked by me:  120/78  Physical Exam  Constitutional: She appears well-developed and well-nourished. No distress.  HENT:  Nose: Nose normal.  Mouth/Throat: Oropharynx is clear and moist.  Neck: Neck supple. No thyromegaly present.  Cardiovascular: Normal rate and regular rhythm.   Pulmonary/Chest: Breath sounds normal. No respiratory distress. She has no wheezes.  Abdominal: Soft. Bowel sounds are normal. There is no  tenderness.  Musculoskeletal: She exhibits no edema or tenderness.  Increased pain - knee with full extension and increased flexion.  Some minimal pain with weight bearing.  No increased swelling or warmth.   Lymphadenopathy:    She has no cervical adenopathy.  Skin: No rash noted. No erythema.  Psychiatric: She has a normal mood and affect. Her behavior is normal.    BP 110/70 mmHg  Pulse 93  Temp(Src) 97.7 F (36.5 C) (Oral)  Resp 18  Ht 5\' 3"  (1.6 m)  Wt 145 lb (65.772 kg)  BMI 25.69 kg/m2  SpO2 93%  LMP 02/21/1966 Wt Readings from Last 3 Encounters:  06/25/15 145 lb (65.772 kg)  05/01/15 142  lb (64.411 kg)  02/20/15 145 lb 5 oz (65.913 kg)     Lab Results  Component Value Date   WBC 3.9* 06/17/2015   HGB 13.6 06/17/2015   HCT 41.2 06/17/2015   PLT 255.0 06/17/2015   GLUCOSE 91 06/17/2015   CHOL 223* 06/17/2015   TRIG 38.0 06/17/2015   HDL 76.50 06/17/2015   LDLDIRECT 128.3 02/08/2013   LDLCALC 139* 06/17/2015   ALT 15 06/17/2015   AST 19 06/17/2015   NA 140 06/17/2015   K 4.0 06/17/2015   CL 102 06/17/2015   CREATININE 0.69 06/17/2015   BUN 19 06/17/2015   CO2 32 06/17/2015   TSH 0.74 06/17/2015   HGBA1C 6.1 10/10/2013        Assessment & Plan:   Problem List Items Addressed This Visit    Asthma    Feels her breathing is stable.  Declines any further testing.        Hypercholesterolemia    Discussed recent cholesterol labs.  LDL 139.  She declines medication.  Follow low cholesterol diet.        Hypertension - Primary    She has not been taking her blood pressure medication on a regular basis.  States may average 2-3x/week.  Took this am and prior - had not had since last week.  Will stop the amlodipine.  Take micardis on a regular basis.  Follow pressures.  Get her back in to reassess.  Recent metabolic panel wnl.        Leukopenia    Wbc 3.9 on recent check.  Follow.        Lump or mass in breast    Had mammogram 04/25/15 - Birads III.  Recommended f/u mammogram in one year.  Saw Dr Bary Castilla 05/01/15.  Recommended f/u mammogram in one year.       Right knee pain    Tylenol arthritis two tablets twice a day as needed.  Check xray.  May need ortho referral.        Relevant Orders   DG Knee 1-2 Views Right       Einar Pheasant, MD

## 2015-06-25 NOTE — Patient Instructions (Signed)
Take tylenol arthritis two tablets twice a day as needed.    Stop amlodipine.

## 2015-06-25 NOTE — Progress Notes (Signed)
Pre-visit discussion using our clinic review tool. No additional management support is needed unless otherwise documented below in the visit note.  

## 2015-06-25 NOTE — Assessment & Plan Note (Signed)
Wbc 3.9 on recent check.  Follow.

## 2015-06-26 ENCOUNTER — Other Ambulatory Visit: Payer: Self-pay | Admitting: Internal Medicine

## 2015-06-26 DIAGNOSIS — M25561 Pain in right knee: Secondary | ICD-10-CM

## 2015-06-26 NOTE — Progress Notes (Signed)
Order placed for ortho referral.   

## 2015-06-30 DIAGNOSIS — M1711 Unilateral primary osteoarthritis, right knee: Secondary | ICD-10-CM | POA: Diagnosis not present

## 2015-08-06 ENCOUNTER — Other Ambulatory Visit: Payer: Self-pay | Admitting: Internal Medicine

## 2015-08-21 DIAGNOSIS — L910 Hypertrophic scar: Secondary | ICD-10-CM | POA: Diagnosis not present

## 2015-08-21 DIAGNOSIS — R234 Changes in skin texture: Secondary | ICD-10-CM | POA: Diagnosis not present

## 2015-08-21 DIAGNOSIS — R208 Other disturbances of skin sensation: Secondary | ICD-10-CM | POA: Diagnosis not present

## 2015-08-29 ENCOUNTER — Encounter (INDEPENDENT_AMBULATORY_CARE_PROVIDER_SITE_OTHER): Payer: Self-pay

## 2015-08-29 ENCOUNTER — Ambulatory Visit (INDEPENDENT_AMBULATORY_CARE_PROVIDER_SITE_OTHER): Payer: Medicare HMO | Admitting: Internal Medicine

## 2015-08-29 ENCOUNTER — Encounter: Payer: Self-pay | Admitting: Internal Medicine

## 2015-08-29 DIAGNOSIS — J4520 Mild intermittent asthma, uncomplicated: Secondary | ICD-10-CM | POA: Diagnosis not present

## 2015-08-29 DIAGNOSIS — N63 Unspecified lump in unspecified breast: Secondary | ICD-10-CM

## 2015-08-29 DIAGNOSIS — E7800 Pure hypercholesterolemia, unspecified: Secondary | ICD-10-CM

## 2015-08-29 DIAGNOSIS — I10 Essential (primary) hypertension: Secondary | ICD-10-CM

## 2015-08-29 DIAGNOSIS — M25561 Pain in right knee: Secondary | ICD-10-CM

## 2015-08-29 DIAGNOSIS — C189 Malignant neoplasm of colon, unspecified: Secondary | ICD-10-CM

## 2015-08-29 MED ORDER — TELMISARTAN 80 MG PO TABS: ORAL_TABLET | ORAL | 1 refills | Status: DC

## 2015-08-29 NOTE — Progress Notes (Signed)
Pre visit review using our clinic review tool, if applicable. No additional management support is needed unless otherwise documented below in the visit note. 

## 2015-08-29 NOTE — Progress Notes (Signed)
[name], female   DOB: [birth year], 82 y.o.   MRN: [id number]   Subjective:    [name], female    DOB: 12/20/1928, 82 y.o.   MRN: [id number]  HPI  Patient here for a scheduled follow up.  She saw ortho.  Had injection.  Better.  Walking better.  Keeping her grandchildren.  Stays active.  Feels her breathing is stable.  She desires no further w/up or evaluation.  No chest pain.  No acid reflux.  No abdominal pain or cramping.  Bowels stable.     Past Medical History:  Diagnosis Date  . Hypercholesterolemia   . Hypertension   . Osteoporosis    Past Surgical History:  Procedure Laterality Date  . ABDOMINAL HYSTERECTOMY    . THYROIDECTOMY, PARTIAL  1956  . TUBAL LIGATION     Family History  Problem Relation Age of Onset  . Stroke Mother   . Colon cancer Father   . Breast cancer Daughter 49  . Breast cancer Cousin    Social History   Social History  . Marital status: Widowed    Spouse name: N/A  . Number of children: N/A  . Years of education: N/A   Social History Main Topics  . Smoking status: Never Smoker  . Smokeless tobacco: Never Used  . Alcohol use No  . Drug use: No  . Sexual activity: Not Asked   Other Topics Concern  . None   Social History Narrative  . None    Outpatient Encounter Prescriptions as of 08/29/2015  Medication Sig  . albuterol (PROVENTIL HFA;VENTOLIN HFA) 108 (90 BASE) MCG/ACT inhaler Inhale 2 puffs into the lungs every 6 (six) hours as needed for wheezing or shortness of breath.  Marland Kitchen aspirin (ASPIRIN EC) 81 MG EC tablet Take 81 mg by mouth daily. Swallow whole.  . Cholecalciferol (VITAMIN D-3) 1000 UNITS CAPS Take 2,000 Units by mouth daily.  . meloxicam (MOBIC) 15 MG tablet Take 15 mg by mouth daily.  Marland Kitchen telmisartan (MICARDIS) 80 MG tablet Take 1 tablet (80 mg total) by mouth daily.  . [DISCONTINUED] telmisartan (MICARDIS) 80 MG tablet Take 1 tablet (80 mg total) by mouth daily.   No facility-administered  encounter medications on file as of 08/29/2015.     Review of Systems  Constitutional: Negative for appetite change and unexpected weight change.  HENT: Negative for congestion and sinus pressure.   Respiratory: Negative for cough and chest tightness.        Breathing stable.    Cardiovascular: Negative for chest pain, palpitations and leg swelling.  Gastrointestinal: Negative for abdominal pain, diarrhea, nausea and vomiting.  Musculoskeletal: Negative for myalgias.       Knee pain better.    Skin: Negative for color change and rash.  Neurological: Negative for dizziness, light-headedness and headaches.  Psychiatric/Behavioral: Negative for agitation and dysphoric mood.       Objective:     Blood pressure rechecked by me:  136/84  Physical Exam  Constitutional: She appears well-developed and well-nourished. No distress.  HENT:  Nose: Nose normal.  Mouth/Throat: Oropharynx is clear and moist.  Neck: Neck supple. No thyromegaly present.  Cardiovascular: Normal rate and regular rhythm.   Pulmonary/Chest: Breath sounds normal. No respiratory distress. She has no wheezes.  Abdominal: Soft. Bowel sounds are normal. There is no tenderness.  Musculoskeletal: She exhibits no edema or tenderness.  Lymphadenopathy:    She has no cervical adenopathy.  Skin: No rash noted. No  erythema.  Psychiatric: She has a normal mood and affect. Her behavior is normal.    BP 136/84   Pulse 84   Temp 97.8 F (36.6 C)   Resp 14   Wt 143 lb (64.9 kg)   LMP 02/21/1966   BMI 25.33 kg/m  Wt Readings from Last 3 Encounters:  08/29/15 143 lb (64.9 kg)  06/25/15 145 lb (65.8 kg)  05/01/15 142 lb (64.4 kg)     Lab Results  Component Value Date   WBC 3.9 (L) 06/17/2015   HGB 13.6 06/17/2015   HCT 41.2 06/17/2015   PLT 255.0 06/17/2015   GLUCOSE 91 06/17/2015   CHOL 223 (H) 06/17/2015   TRIG 38.0 06/17/2015   HDL 76.50 06/17/2015   LDLDIRECT 128.3 02/08/2013   LDLCALC 139 (H) 06/17/2015    ALT 15 06/17/2015   AST 19 06/17/2015   NA 140 06/17/2015   K 4.0 06/17/2015   CL 102 06/17/2015   CREATININE 0.69 06/17/2015   BUN 19 06/17/2015   CO2 32 06/17/2015   TSH 0.74 06/17/2015   HGBA1C 6.1 10/10/2013    Dg Knee 1-2 Views Right  Result Date: 06/25/2015 CLINICAL DATA:  Right knee pain.  Initial evaluation EXAM: RIGHT KNEE - 1-2 VIEW COMPARISON:  02/12/2014. FINDINGS: Tricompartment degenerative change. Chondrocalcinosis. Small loose bodies noted. No evidence of fracture or dislocation. Popliteal artery atherosclerotic vascular calcification IMPRESSION: 1. Tricompartment degenerative change with loose bodies and chondrocalcinosis. 2. Diffuse osteopenia.  No acute bony or joint abnormality. 3.  Peripheral vascular disease. Electronically Signed   By: Marcello Moores  Register   On: 06/25/2015 10:57       Assessment & Plan:   Problem List Items Addressed This Visit    Asthma    Feels breathing is stable.  Declines further w/up.       Colon adenocarcinoma ([place])    Colonoscopy 06/08/12.  CEA 10/15/14 - 2.6.        Relevant Orders   CEA   Hypercholesterolemia    She has declined cholesterol medication.  Low cholesterol diet and exercise.  Follow lipid panel.        Relevant Medications   telmisartan (MICARDIS) 80 MG tablet   Other Relevant Orders   Lipid panel   Hepatic function panel   Hypertension    Stopped amlodipine last visit.  Blood pressure as outlined.  Follow pressures.  Follow metabolic panel.  Continue micardis.        Relevant Medications   telmisartan (MICARDIS) 80 MG tablet   Other Relevant Orders   CBC with Differential/Platelet   Basic metabolic panel   Lump or mass in breast    Had mammogram 04/25/15 - Birads III.  Recommended f/u mammogram in one year.  Saw Dr Bary Castilla.  Recommended mammogram f/u in one year.        Right knee pain    S/p injection.  Doing better.  Follow.         Other Visit Diagnoses   None.      Kathy Pheasant, MD

## 2015-08-30 ENCOUNTER — Encounter: Payer: Self-pay | Admitting: Internal Medicine

## 2015-08-30 NOTE — Assessment & Plan Note (Signed)
Had mammogram 04/25/15 - Birads III.  Recommended f/u mammogram in one year.  Saw Dr Bary Castilla.  Recommended mammogram f/u in one year.

## 2015-08-30 NOTE — Assessment & Plan Note (Signed)
S/p injection.  Doing better.  Follow.   

## 2015-08-30 NOTE — Assessment & Plan Note (Signed)
Colonoscopy 06/08/12.  CEA 10/15/14 - 2.6.

## 2015-08-30 NOTE — Assessment & Plan Note (Signed)
Feels breathing is stable.  Declines further w/up.

## 2015-08-30 NOTE — Assessment & Plan Note (Signed)
Stopped amlodipine last visit.  Blood pressure as outlined.  Follow pressures.  Follow metabolic panel.  Continue micardis.

## 2015-08-30 NOTE — Assessment & Plan Note (Signed)
She has declined cholesterol medication.  Low cholesterol diet and exercise.  Follow lipid panel.   

## 2015-10-03 DIAGNOSIS — J45909 Unspecified asthma, uncomplicated: Secondary | ICD-10-CM | POA: Diagnosis not present

## 2015-10-03 DIAGNOSIS — I10 Essential (primary) hypertension: Secondary | ICD-10-CM | POA: Diagnosis not present

## 2015-10-03 DIAGNOSIS — Z0000 Encounter for general adult medical examination without abnormal findings: Secondary | ICD-10-CM | POA: Diagnosis not present

## 2015-10-09 DIAGNOSIS — I8312 Varicose veins of left lower extremity with inflammation: Secondary | ICD-10-CM | POA: Diagnosis not present

## 2015-10-09 DIAGNOSIS — I8311 Varicose veins of right lower extremity with inflammation: Secondary | ICD-10-CM | POA: Diagnosis not present

## 2015-10-09 DIAGNOSIS — L910 Hypertrophic scar: Secondary | ICD-10-CM | POA: Diagnosis not present

## 2015-10-09 DIAGNOSIS — D692 Other nonthrombocytopenic purpura: Secondary | ICD-10-CM | POA: Diagnosis not present

## 2015-11-14 DIAGNOSIS — H43821 Vitreomacular adhesion, right eye: Secondary | ICD-10-CM | POA: Diagnosis not present

## 2015-11-20 DIAGNOSIS — R234 Changes in skin texture: Secondary | ICD-10-CM | POA: Diagnosis not present

## 2015-11-20 DIAGNOSIS — L910 Hypertrophic scar: Secondary | ICD-10-CM | POA: Diagnosis not present

## 2015-11-20 DIAGNOSIS — I8310 Varicose veins of unspecified lower extremity with inflammation: Secondary | ICD-10-CM | POA: Diagnosis not present

## 2015-11-20 DIAGNOSIS — L819 Disorder of pigmentation, unspecified: Secondary | ICD-10-CM | POA: Diagnosis not present

## 2015-12-01 ENCOUNTER — Other Ambulatory Visit (INDEPENDENT_AMBULATORY_CARE_PROVIDER_SITE_OTHER): Payer: Medicare HMO

## 2015-12-01 DIAGNOSIS — C189 Malignant neoplasm of colon, unspecified: Secondary | ICD-10-CM | POA: Diagnosis not present

## 2015-12-01 DIAGNOSIS — I10 Essential (primary) hypertension: Secondary | ICD-10-CM

## 2015-12-01 DIAGNOSIS — E7800 Pure hypercholesterolemia, unspecified: Secondary | ICD-10-CM

## 2015-12-01 LAB — LIPID PANEL
CHOL/HDL RATIO: 3
CHOLESTEROL: 228 mg/dL — AB (ref 0–200)
HDL: 69.8 mg/dL (ref >39.00)
LDL CALC: 144 mg/dL — AB (ref 0–99)
NonHDL: 158.24
TRIGLYCERIDES: 69 mg/dL (ref 0.0–149.0)
VLDL: 13.8 mg/dL (ref 0.0–40.0)

## 2015-12-01 LAB — BASIC METABOLIC PANEL
BUN: 13 mg/dL (ref 6–23)
CHLORIDE: 101 meq/L (ref 96–112)
CO2: 34 mEq/L — ABNORMAL HIGH (ref 19–32)
Calcium: 9.7 mg/dL (ref 8.4–10.5)
Creatinine, Ser: 0.69 mg/dL (ref 0.40–1.20)
GFR: 103.81 mL/min (ref >60.00)
Glucose, Bld: 102 mg/dL — ABNORMAL HIGH (ref 70–99)
POTASSIUM: 4.2 meq/L (ref 3.5–5.1)
SODIUM: 143 meq/L (ref 135–145)

## 2015-12-01 LAB — CBC WITH DIFFERENTIAL/PLATELET
BASOS PCT: 0.5 % (ref 0.0–3.0)
Basophils Absolute: 0 10*3/uL (ref 0.0–0.1)
EOS ABS: 0 10*3/uL (ref 0.0–0.7)
EOS PCT: 0.1 % (ref 0.0–5.0)
HEMATOCRIT: 43.7 % (ref 36.0–46.0)
HEMOGLOBIN: 14.5 g/dL (ref 12.0–15.0)
LYMPHS PCT: 33 % (ref 12.0–46.0)
Lymphs Abs: 1.4 10*3/uL (ref 0.7–4.0)
MCHC: 33.3 g/dL (ref 30.0–36.0)
MCV: 90.1 fl (ref 78.0–100.0)
Monocytes Absolute: 0.5 10*3/uL (ref 0.1–1.0)
Monocytes Relative: 10.8 % (ref 3.0–12.0)
Neutro Abs: 2.3 10*3/uL (ref 1.4–7.7)
Neutrophils Relative %: 55.6 % (ref 43.0–77.0)
Platelets: 154 10*3/uL (ref 150.0–400.0)
RBC: 4.86 Mil/uL (ref 3.87–5.11)
RDW: 14.3 % (ref 11.5–15.5)
WBC: 4.2 10*3/uL (ref 4.0–10.5)

## 2015-12-01 LAB — HEPATIC FUNCTION PANEL
ALT: 62 U/L — AB (ref 0–35)
AST: 42 U/L — ABNORMAL HIGH (ref 0–37)
Albumin: 4.1 g/dL (ref 3.5–5.2)
Alkaline Phosphatase: 68 U/L (ref 39–117)
BILIRUBIN DIRECT: 0.2 mg/dL (ref 0.0–0.3)
TOTAL PROTEIN: 6.5 g/dL (ref 6.0–8.3)
Total Bilirubin: 0.8 mg/dL (ref 0.2–1.2)

## 2015-12-02 LAB — CEA: CEA: 1.5 ng/mL

## 2015-12-04 ENCOUNTER — Encounter: Payer: Self-pay | Admitting: Internal Medicine

## 2015-12-04 ENCOUNTER — Ambulatory Visit (INDEPENDENT_AMBULATORY_CARE_PROVIDER_SITE_OTHER): Payer: Medicare HMO | Admitting: Internal Medicine

## 2015-12-04 DIAGNOSIS — I10 Essential (primary) hypertension: Secondary | ICD-10-CM | POA: Diagnosis not present

## 2015-12-04 DIAGNOSIS — R7989 Other specified abnormal findings of blood chemistry: Secondary | ICD-10-CM | POA: Diagnosis not present

## 2015-12-04 DIAGNOSIS — Z23 Encounter for immunization: Secondary | ICD-10-CM

## 2015-12-04 DIAGNOSIS — R945 Abnormal results of liver function studies: Secondary | ICD-10-CM

## 2015-12-04 DIAGNOSIS — C189 Malignant neoplasm of colon, unspecified: Secondary | ICD-10-CM

## 2015-12-04 DIAGNOSIS — E7800 Pure hypercholesterolemia, unspecified: Secondary | ICD-10-CM

## 2015-12-04 DIAGNOSIS — J4520 Mild intermittent asthma, uncomplicated: Secondary | ICD-10-CM

## 2015-12-04 DIAGNOSIS — R0602 Shortness of breath: Secondary | ICD-10-CM

## 2015-12-04 MED ORDER — ALBUTEROL SULFATE HFA 108 (90 BASE) MCG/ACT IN AERS: 2.0000 | INHALATION_SPRAY | Freq: Four times a day (QID) | RESPIRATORY_TRACT | 2 refills | Status: DC | PRN

## 2015-12-04 NOTE — Progress Notes (Signed)
Pre visit review using our clinic review tool, if applicable. No additional management support is needed unless otherwise documented below in the visit note. 

## 2015-12-04 NOTE — Progress Notes (Signed)
[name], female   DOB: [birth year], 82 y.o.   MRN: [id number]   Subjective:    [name], female    DOB: [birth year], 82 y.o.   MRN: [id number]  HPI  Patient here for a scheduled follow up.  She reports she is doing relatively well.  Keeping her great grandchildren.  Some increased stress with this.  Overall she feels she is doing relatively well.  Reports her breathing is overall stable.  Desires no further intervention.  No chest pain.  No acid reflux.  No abdominal pain or cramping.  Bowels stable.    Past Medical History:  Diagnosis Date  . Hypercholesterolemia   . Hypertension   . Osteoporosis    Past Surgical History:  Procedure Laterality Date  . ABDOMINAL HYSTERECTOMY    . THYROIDECTOMY, PARTIAL  1956  . TUBAL LIGATION     Family History  Problem Relation Age of Onset  . Stroke Mother   . Colon cancer Father   . Breast cancer Daughter 54  . Breast cancer Cousin    Social History   Social History  . Marital status: Widowed    Spouse name: N/A  . Number of children: N/A  . Years of education: N/A   Social History Main Topics  . Smoking status: Never Smoker  . Smokeless tobacco: Never Used  . Alcohol use No  . Drug use: No  . Sexual activity: Not Asked   Other Topics Concern  . None   Social History Narrative  . None    Outpatient Encounter Prescriptions as of 12/04/2015  Medication Sig  . albuterol (PROVENTIL HFA;VENTOLIN HFA) 108 (90 Base) MCG/ACT inhaler Inhale 2 puffs into the lungs every 6 (six) hours as needed for wheezing or shortness of breath.  Marland Kitchen aspirin (ASPIRIN EC) 81 MG EC tablet Take 81 mg by mouth daily. Swallow whole.  . Cholecalciferol (VITAMIN D-3) 1000 UNITS CAPS Take 2,000 Units by mouth daily.  . meloxicam (MOBIC) 15 MG tablet Take 15 mg by mouth daily.  Marland Kitchen telmisartan (MICARDIS) 80 MG tablet Take 1 tablet (80 mg total) by mouth daily.  . [DISCONTINUED] albuterol (PROVENTIL HFA;VENTOLIN HFA) 108 (90 BASE)  MCG/ACT inhaler Inhale 2 puffs into the lungs every 6 (six) hours as needed for wheezing or shortness of breath.   No facility-administered encounter medications on file as of 12/04/2015.     Review of Systems  Constitutional: Negative for appetite change and unexpected weight change.  HENT: Negative for congestion and sinus pressure.   Respiratory: Negative for cough and chest tightness.        Feels her breathing is stable.    Cardiovascular: Negative for chest pain, palpitations and leg swelling.  Gastrointestinal: Negative for abdominal pain, diarrhea, nausea and vomiting.  Genitourinary: Negative for difficulty urinating and dysuria.  Musculoskeletal: Negative for back pain and joint swelling.  Skin: Negative for color change and rash.  Neurological: Negative for dizziness, light-headedness and headaches.  Psychiatric/Behavioral: Negative for agitation and dysphoric mood.       Objective:    Physical Exam  Constitutional: She appears well-developed and well-nourished. No distress.  HENT:  Nose: Nose normal.  Mouth/Throat: Oropharynx is clear and moist.  Neck: Neck supple. No thyromegaly present.  Cardiovascular: Normal rate and regular rhythm.   Pulmonary/Chest: Breath sounds normal. No respiratory distress. She has no wheezes.  Abdominal: Soft. Bowel sounds are normal. There is no tenderness.  Musculoskeletal: She exhibits no edema or tenderness.  Lymphadenopathy:  She has no cervical adenopathy.  Skin: No rash noted. No erythema.  Psychiatric: She has a normal mood and affect. Her behavior is normal.    BP 140/80 (BP Location: Left Arm, Patient Position: Sitting, Cuff Size: Normal)   Pulse 93   Temp 97.8 F (36.6 C) (Oral)   Resp 18   Wt 139 lb 2 oz (63.1 kg)   LMP 02/21/1966   SpO2 95%   BMI 24.64 kg/m  Wt Readings from Last 3 Encounters:  12/04/15 139 lb 2 oz (63.1 kg)  08/29/15 143 lb (64.9 kg)  06/25/15 145 lb (65.8 kg)     Lab Results  Component  Value Date   WBC 4.8 12/09/2015   HGB 14.2 12/09/2015   HCT 43.3 12/09/2015   PLT 179.0 12/09/2015   GLUCOSE 102 (H) 12/01/2015   CHOL 228 (H) 12/01/2015   TRIG 69.0 12/01/2015   HDL 69.80 12/01/2015   LDLDIRECT 128.3 02/08/2013   LDLCALC 144 (H) 12/01/2015   ALT 46 (H) 12/09/2015   AST 33 12/09/2015   NA 143 12/01/2015   K 4.2 12/01/2015   CL 101 12/01/2015   CREATININE 0.69 12/01/2015   BUN 13 12/01/2015   CO2 34 (H) 12/01/2015   TSH 0.74 06/17/2015   INR 1.0 12/09/2015   HGBA1C 6.1 10/10/2013    Dg Knee 1-2 Views Right  Result Date: 06/25/2015 CLINICAL DATA:  Right knee pain.  Initial evaluation EXAM: RIGHT KNEE - 1-2 VIEW COMPARISON:  02/12/2014. FINDINGS: Tricompartment degenerative change. Chondrocalcinosis. Small loose bodies noted. No evidence of fracture or dislocation. Popliteal artery atherosclerotic vascular calcification IMPRESSION: 1. Tricompartment degenerative change with loose bodies and chondrocalcinosis. 2. Diffuse osteopenia.  No acute bony or joint abnormality. 3.  Peripheral vascular disease. Electronically Signed   By: Marcello Moores  Register   On: 06/25/2015 10:57       Assessment & Plan:   Problem List Items Addressed This Visit    Abnormal liver function tests    Recent slight elevation.  Recheck liver panel soon.  Further w/up pending results.        Relevant Orders   Protime-INR (Completed)   CBC with Differential/Platelet (Completed)   Hepatic function panel (Completed)   Asthma    Feels breathing is stable.  Declines further evaluation and w/up.        Relevant Medications   albuterol (PROVENTIL HFA;VENTOLIN HFA) 108 (90 Base) MCG/ACT inhaler   Colon adenocarcinoma ([place])    Colonoscopy 06/08/12.   CEA 12/01/15 - 1.5.        Hypercholesterolemia    Low cholesterol diet and exercise.  Follow lipid panel.  She desires not to take cholesterol medication.  Discussed recent labs.   Lab Results  Component Value Date   CHOL 228 (H) 12/01/2015   HDL  69.80 12/01/2015   LDLCALC 144 (H) 12/01/2015   LDLDIRECT 128.3 02/08/2013   TRIG 69.0 12/01/2015   CHOLHDL 3 12/01/2015        Hypertension    Blood pressure elevated today.  Have her spot check her pressure.  Get her back in soon to reassess.  Follow pressures.  Follow metabolic panel.       Shortness of breath    She feels her breathing is stable.  Discussed further w/up and evaluation.  She declines.  Follow.         Other Visit Diagnoses    Encounter for immunization       Relevant Orders   Flu vaccine HIGH  DOSE PF (Completed)       Einar Pheasant, MD

## 2015-12-09 ENCOUNTER — Other Ambulatory Visit (INDEPENDENT_AMBULATORY_CARE_PROVIDER_SITE_OTHER): Payer: Medicare HMO

## 2015-12-09 DIAGNOSIS — R7989 Other specified abnormal findings of blood chemistry: Secondary | ICD-10-CM

## 2015-12-09 DIAGNOSIS — R945 Abnormal results of liver function studies: Secondary | ICD-10-CM

## 2015-12-09 DIAGNOSIS — Z7901 Long term (current) use of anticoagulants: Secondary | ICD-10-CM | POA: Diagnosis not present

## 2015-12-09 LAB — CBC WITH DIFFERENTIAL/PLATELET
Basophils Absolute: 0 10*3/uL (ref 0.0–0.1)
Basophils Relative: 0.4 % (ref 0.0–3.0)
EOS PCT: 0.1 % (ref 0.0–5.0)
Eosinophils Absolute: 0 10*3/uL (ref 0.0–0.7)
HCT: 43.3 % (ref 36.0–46.0)
Hemoglobin: 14.2 g/dL (ref 12.0–15.0)
LYMPHS ABS: 1.5 10*3/uL (ref 0.7–4.0)
LYMPHS PCT: 31.6 % (ref 12.0–46.0)
MCHC: 32.8 g/dL (ref 30.0–36.0)
MCV: 90.8 fl (ref 78.0–100.0)
MONOS PCT: 11.1 % (ref 3.0–12.0)
Monocytes Absolute: 0.5 10*3/uL (ref 0.1–1.0)
NEUTROS ABS: 2.7 10*3/uL (ref 1.4–7.7)
NEUTROS PCT: 56.8 % (ref 43.0–77.0)
PLATELETS: 179 10*3/uL (ref 150.0–400.0)
RBC: 4.77 Mil/uL (ref 3.87–5.11)
RDW: 14.3 % (ref 11.5–15.5)
WBC: 4.8 10*3/uL (ref 4.0–10.5)

## 2015-12-09 LAB — HEPATIC FUNCTION PANEL
ALBUMIN: 3.8 g/dL (ref 3.5–5.2)
ALT: 46 U/L — ABNORMAL HIGH (ref 0–35)
AST: 33 U/L (ref 0–37)
Alkaline Phosphatase: 64 U/L (ref 39–117)
Bilirubin, Direct: 0.2 mg/dL (ref 0.0–0.3)
TOTAL PROTEIN: 6.3 g/dL (ref 6.0–8.3)
Total Bilirubin: 0.6 mg/dL (ref 0.2–1.2)

## 2015-12-09 LAB — PROTIME-INR
INR: 1 ratio (ref 0.8–1.0)
Prothrombin Time: 10.8 s (ref 9.6–13.1)

## 2015-12-10 ENCOUNTER — Other Ambulatory Visit: Payer: Self-pay | Admitting: Internal Medicine

## 2015-12-10 DIAGNOSIS — R945 Abnormal results of liver function studies: Secondary | ICD-10-CM

## 2015-12-10 DIAGNOSIS — R7989 Other specified abnormal findings of blood chemistry: Secondary | ICD-10-CM

## 2015-12-10 NOTE — Progress Notes (Signed)
Order placed for f/u liver panel.  

## 2015-12-10 NOTE — Progress Notes (Signed)
Order placed for abdominal ultrasound.   

## 2015-12-14 ENCOUNTER — Encounter: Payer: Self-pay | Admitting: Internal Medicine

## 2015-12-14 NOTE — Assessment & Plan Note (Signed)
Blood pressure elevated today.  Have her spot check her pressure. Get her back in soon to reassess.  Follow pressures.  Follow metabolic panel.   

## 2015-12-14 NOTE — Assessment & Plan Note (Signed)
Recent slight elevation.  Recheck liver panel soon.  Further w/up pending results.

## 2015-12-14 NOTE — Assessment & Plan Note (Signed)
She feels her breathing is stable.  Discussed further w/up and evaluation.  She declines.  Follow.

## 2015-12-14 NOTE — Assessment & Plan Note (Signed)
Colonoscopy 06/08/12.   CEA 12/01/15 - 1.5.

## 2015-12-14 NOTE — Assessment & Plan Note (Signed)
Low cholesterol diet and exercise.  Follow lipid panel.  She desires not to take cholesterol medication.  Discussed recent labs.   Lab Results  Component Value Date   CHOL 228 (H) 12/01/2015   HDL 69.80 12/01/2015   LDLCALC 144 (H) 12/01/2015   LDLDIRECT 128.3 02/08/2013   TRIG 69.0 12/01/2015   CHOLHDL 3 12/01/2015

## 2015-12-14 NOTE — Assessment & Plan Note (Signed)
Feels breathing is stable.  Declines further evaluation and w/up.

## 2015-12-22 ENCOUNTER — Ambulatory Visit
Admission: RE | Admit: 2015-12-22 | Discharge: 2015-12-22 | Disposition: A | Discharge status: Home / Self Care | Payer: Medicare HMO | Source: Ambulatory Visit | Attending: Internal Medicine | Admitting: Internal Medicine

## 2015-12-22 DIAGNOSIS — R7989 Other specified abnormal findings of blood chemistry: Secondary | ICD-10-CM | POA: Diagnosis not present

## 2015-12-22 DIAGNOSIS — R945 Abnormal results of liver function studies: Secondary | ICD-10-CM

## 2015-12-31 ENCOUNTER — Other Ambulatory Visit (INDEPENDENT_AMBULATORY_CARE_PROVIDER_SITE_OTHER): Payer: Medicare HMO

## 2015-12-31 DIAGNOSIS — R7989 Other specified abnormal findings of blood chemistry: Secondary | ICD-10-CM

## 2015-12-31 DIAGNOSIS — R945 Abnormal results of liver function studies: Secondary | ICD-10-CM

## 2015-12-31 LAB — HEPATIC FUNCTION PANEL
ALBUMIN: 3.9 g/dL (ref 3.5–5.2)
ALK PHOS: 70 U/L (ref 39–117)
ALT: 33 U/L (ref 0–35)
AST: 25 U/L (ref 0–37)
BILIRUBIN DIRECT: 0.1 mg/dL (ref 0.0–0.3)
BILIRUBIN TOTAL: 0.6 mg/dL (ref 0.2–1.2)
Total Protein: 6.2 g/dL (ref 6.0–8.3)

## 2016-01-28 ENCOUNTER — Encounter: Payer: Self-pay | Admitting: *Deleted

## 2016-01-28 ENCOUNTER — Inpatient Hospital Stay
Admission: EM | Admit: 2016-01-28 | Discharge: 2016-02-03 | DRG: 291 | Disposition: A | Discharge status: Home / Self Care | Payer: Medicare HMO | Attending: Internal Medicine | Admitting: Internal Medicine

## 2016-01-28 ENCOUNTER — Emergency Department: Payer: Medicare HMO

## 2016-01-28 ENCOUNTER — Telehealth: Payer: Self-pay | Admitting: Internal Medicine

## 2016-01-28 DIAGNOSIS — J9602 Acute respiratory failure with hypercapnia: Secondary | ICD-10-CM | POA: Diagnosis present

## 2016-01-28 DIAGNOSIS — I4729 Other ventricular tachycardia: Secondary | ICD-10-CM

## 2016-01-28 DIAGNOSIS — Z7982 Long term (current) use of aspirin: Secondary | ICD-10-CM | POA: Diagnosis not present

## 2016-01-28 DIAGNOSIS — J449 Chronic obstructive pulmonary disease, unspecified: Secondary | ICD-10-CM | POA: Diagnosis present

## 2016-01-28 DIAGNOSIS — I10 Essential (primary) hypertension: Secondary | ICD-10-CM | POA: Diagnosis not present

## 2016-01-28 DIAGNOSIS — J440 Chronic obstructive pulmonary disease with acute lower respiratory infection: Secondary | ICD-10-CM | POA: Diagnosis not present

## 2016-01-28 DIAGNOSIS — Z79899 Other long term (current) drug therapy: Secondary | ICD-10-CM | POA: Diagnosis not present

## 2016-01-28 DIAGNOSIS — D72819 Decreased white blood cell count, unspecified: Secondary | ICD-10-CM | POA: Diagnosis present

## 2016-01-28 DIAGNOSIS — J45909 Unspecified asthma, uncomplicated: Secondary | ICD-10-CM | POA: Diagnosis not present

## 2016-01-28 DIAGNOSIS — R0602 Shortness of breath: Secondary | ICD-10-CM | POA: Diagnosis not present

## 2016-01-28 DIAGNOSIS — Z66 Do not resuscitate: Secondary | ICD-10-CM | POA: Diagnosis not present

## 2016-01-28 DIAGNOSIS — I472 Ventricular tachycardia: Secondary | ICD-10-CM | POA: Diagnosis not present

## 2016-01-28 DIAGNOSIS — I110 Hypertensive heart disease with heart failure: Secondary | ICD-10-CM

## 2016-01-28 DIAGNOSIS — J9621 Acute and chronic respiratory failure with hypoxia: Secondary | ICD-10-CM | POA: Diagnosis not present

## 2016-01-28 DIAGNOSIS — J209 Acute bronchitis, unspecified: Secondary | ICD-10-CM | POA: Diagnosis present

## 2016-01-28 DIAGNOSIS — E7800 Pure hypercholesterolemia, unspecified: Secondary | ICD-10-CM | POA: Diagnosis present

## 2016-01-28 DIAGNOSIS — J9601 Acute respiratory failure with hypoxia: Secondary | ICD-10-CM | POA: Diagnosis not present

## 2016-01-28 DIAGNOSIS — E785 Hyperlipidemia, unspecified: Secondary | ICD-10-CM | POA: Diagnosis present

## 2016-01-28 DIAGNOSIS — I429 Cardiomyopathy, unspecified: Secondary | ICD-10-CM

## 2016-01-28 DIAGNOSIS — I471 Supraventricular tachycardia: Secondary | ICD-10-CM | POA: Diagnosis present

## 2016-01-28 DIAGNOSIS — J9622 Acute and chronic respiratory failure with hypercapnia: Secondary | ICD-10-CM | POA: Diagnosis not present

## 2016-01-28 DIAGNOSIS — I5021 Acute systolic (congestive) heart failure: Secondary | ICD-10-CM | POA: Diagnosis present

## 2016-01-28 DIAGNOSIS — M1990 Unspecified osteoarthritis, unspecified site: Secondary | ICD-10-CM | POA: Diagnosis present

## 2016-01-28 DIAGNOSIS — M810 Age-related osteoporosis without current pathological fracture: Secondary | ICD-10-CM | POA: Diagnosis not present

## 2016-01-28 DIAGNOSIS — J45901 Unspecified asthma with (acute) exacerbation: Secondary | ICD-10-CM | POA: Diagnosis present

## 2016-01-28 DIAGNOSIS — E876 Hypokalemia: Secondary | ICD-10-CM | POA: Diagnosis not present

## 2016-01-28 DIAGNOSIS — J4489 Other specified chronic obstructive pulmonary disease: Secondary | ICD-10-CM | POA: Diagnosis present

## 2016-01-28 DIAGNOSIS — Z791 Long term (current) use of non-steroidal anti-inflammatories (NSAID): Secondary | ICD-10-CM

## 2016-01-28 DIAGNOSIS — I509 Heart failure, unspecified: Secondary | ICD-10-CM | POA: Insufficient documentation

## 2016-01-28 DIAGNOSIS — R0600 Dyspnea, unspecified: Secondary | ICD-10-CM | POA: Diagnosis not present

## 2016-01-28 DIAGNOSIS — J441 Chronic obstructive pulmonary disease with (acute) exacerbation: Secondary | ICD-10-CM | POA: Diagnosis present

## 2016-01-28 DIAGNOSIS — R2681 Unsteadiness on feet: Secondary | ICD-10-CM

## 2016-01-28 HISTORY — DX: Other cardiomyopathies: I42.8

## 2016-01-28 HISTORY — DX: Hypertensive heart disease with heart failure: I11.0

## 2016-01-28 HISTORY — DX: Chronic systolic (congestive) heart failure: I50.22

## 2016-01-28 LAB — BASIC METABOLIC PANEL
ANION GAP: 5 (ref 5–15)
BUN: 18 mg/dL (ref 6–20)
CO2: 33 mmol/L — ABNORMAL HIGH (ref 22–32)
Calcium: 9 mg/dL (ref 8.9–10.3)
Chloride: 101 mmol/L (ref 101–111)
Creatinine, Ser: 0.77 mg/dL (ref 0.44–1.00)
GFR calc Af Amer: >60 mL/min (ref >60)
GLUCOSE: 106 mg/dL — AB (ref 65–99)
POTASSIUM: 3.8 mmol/L (ref 3.5–5.1)
SODIUM: 139 mmol/L (ref 135–145)

## 2016-01-28 LAB — CBC
HEMATOCRIT: 40.9 % (ref 35.0–47.0)
HEMOGLOBIN: 13.5 g/dL (ref 12.0–16.0)
MCH: 30 pg (ref 26.0–34.0)
MCHC: 33 g/dL (ref 32.0–36.0)
MCV: 90.8 fL (ref 80.0–100.0)
Platelets: 181 10*3/uL (ref 150–440)
RBC: 4.5 MIL/uL (ref 3.80–5.20)
RDW: 14.4 % (ref 11.5–14.5)
WBC: 3.8 10*3/uL (ref 3.6–11.0)

## 2016-01-28 LAB — TROPONIN I: Troponin I: <0.03 ng/mL (ref <0.03)

## 2016-01-28 LAB — BRAIN NATRIURETIC PEPTIDE: B NATRIURETIC PEPTIDE 5: 795 pg/mL — AB (ref 0.0–100.0)

## 2016-01-28 NOTE — ED Notes (Signed)
Dr. Hugelmyer at bedside.  

## 2016-01-28 NOTE — ED Provider Notes (Signed)
[facility] Emergency Department Provider Note  Time seen: 9:39 PM  I have reviewed the triage vital signs and the nursing notes.   HISTORY  Chief Complaint Shortness of Breath    HPI Kathy Dominguez is a 83 y.o. female With a past medical history of hypertension, hyperlipidemia who presents the emergency department with 1 week of increased shortness of breath. Patient also states since Christmas she has been having increased swelling in her feet. Denies any swelling at baseline. Denies any history of congestive heart failure or peripheral edema. Denies any COPD or asthma. Patient states she becomes significantly short of breath with minimal exertion. Even lying in bed she is short of breath. Patient denies any chest pain at any time overthe past few weeks.describes her shortness of breath is moderate much worse with exertion. Denies any leg pain. Lower extremity edema is new times one week.  Past Medical History:  Diagnosis Date  . Hypercholesterolemia   . Hypertension   . Osteoporosis     Patient Active Problem List   Diagnosis Date Noted  . Abnormal liver function tests 12/04/2015  . Right knee pain 06/25/2015  . Right leg pain 02/24/2015  . Shortness of breath 08/07/2014  . Other fatigue 08/07/2014  . Health care maintenance 06/13/2014  . Leg skin lesion, right 06/24/2013  . Left arm pain 06/11/2013  . Lump or mass in breast 05/21/2013  . Leukopenia 02/23/2012  . Colon adenocarcinoma ([place]) 02/23/2012  . Asthma 02/23/2012  . Hypertension 02/22/2012  . Hypercholesterolemia 02/22/2012    Past Surgical History:  Procedure Laterality Date  . ABDOMINAL HYSTERECTOMY    . THYROIDECTOMY, PARTIAL  1956  . TUBAL LIGATION      Prior to Admission medications   Medication Sig Start Date End Date Taking? Authorizing Provider  albuterol (PROVENTIL HFA;VENTOLIN HFA) 108 (90 Base) MCG/ACT inhaler Inhale 2 puffs into the lungs every 6 (six) hours as needed  for wheezing or shortness of breath. 12/04/15   Einar Pheasant, MD  aspirin (ASPIRIN EC) 81 MG EC tablet Take 81 mg by mouth daily. Swallow whole.    Historical Provider, MD  Cholecalciferol (VITAMIN D-3) 1000 UNITS CAPS Take 2,000 Units by mouth daily.    Historical Provider, MD  meloxicam (MOBIC) 15 MG tablet Take 15 mg by mouth daily.    Historical Provider, MD  telmisartan (MICARDIS) 80 MG tablet Take 1 tablet (80 mg total) by mouth daily. 08/29/15   Einar Pheasant, MD    No Known Allergies  Family History  Problem Relation Age of Onset  . Stroke Mother   . Colon cancer Father   . Breast cancer Daughter 91  . Breast cancer Cousin     Social History Social History  Substance Use Topics  . Smoking status: Never Smoker  . Smokeless tobacco: Never Used  . Alcohol use No    Review of Systems Constitutional: Negative for fever. Cardiovascular: Negative for chest pain. Respiratory: moderate shortness of breath. Gastrointestinal: Negative for abdominal pain Neurological: Negative for headache 10-point ROS otherwise negative.  ____________________________________________   PHYSICAL EXAM:  VITAL SIGNS: ED Triage Vitals [01/28/16 1715]  Enc Vitals Group     BP (!) 159/95     Pulse Rate 98     Resp 20     Temp 97.5 F (36.4 C)     Temp Source Oral     SpO2 96 %     Weight 149 lb (67.6 kg)     Height 5'  2" (1.575 m)     Head Circumference      Peak Flow      Pain Score      Pain Loc      Pain Edu?      Excl. in [place]?    Constitutional: Alert and oriented. Well appearing and in no distress. Eyes: Normal exam ENT   Head: Normocephalic and atraumatic.   Mouth/Throat: Mucous membranes are moist. Cardiovascular: Normal rate, regular rhythm.  Respiratory: Normal respiratory effort without tachypnea nor retractions. Breath sounds are clear  Gastrointestinal: Soft and nontender. No distention. Musculoskeletal: Nontender with normal range of motion in all extremities.  moderate pedal edema bilaterally. No calf tenderness. Neurologic:  Normal speech and language. No gross focal neurologic deficits Skin:  Skin is warm, dry and intact.  Psychiatric: Mood and affect are normal.   ____________________________________________    EKG  eKG reviewed and interpreted by myself shows normal sinus rhythm at 91 bpm, narrow QRS, mild left axis deviation, normal interval, no concerning ST changes.  ____________________________________________    RADIOLOGY  chest x-ray shows vascular congestion without overt pulmonary edema.  ____________________________________________   INITIAL IMPRESSION / ASSESSMENT AND PLAN / ED COURSE  Pertinent labs & imaging results that were available during my care of the patient were reviewed by me and considered in my medical decision making (see chart for details).  patient presents emergency department with 1 week of shortness of breath and new onset pedal edema. Chest x-ray shows vascular congestion. Patient currently satting 89% on room air no home O2 requirement at baseline. Given the patient's hypoxia and chest x-ray findings highly suspect new onset CHF. We'll admit the patient to the hospital for diuresis and cardiology consultation/echocardiogram.  ____________________________________________   FINAL CLINICAL IMPRESSION(S) / ED DIAGNOSES  new-onset congestive heart failure    Harvest Dark, MD 01/28/16 2142

## 2016-01-28 NOTE — ED Triage Notes (Signed)
Pt reports increased swelling to bilateral legs with shortness of breath for 1 week

## 2016-01-28 NOTE — Telephone Encounter (Signed)
Lattie Haw RN from [facility] called our office stated that she was going to urge patient to go to ER for evaluation of shortness of breath.

## 2016-01-28 NOTE — Telephone Encounter (Signed)
No documentation from team health called patient to verify patient had received help there is no answer at number left message to call office.

## 2016-01-28 NOTE — ED Notes (Signed)
Pt states she feels SOB in morning and bilat ankle swelling at night x 1 week. Pt denies CHF or taking a lasix. Pt is alert and oriented, no distress noted at this time. Pt states that ankle swelling has done down since waiting in ED.

## 2016-01-28 NOTE — H&P (Signed)
History and Physical   [facility] - [place] @ [facility] Admission History and Physical [employer], D.O.    Patient Name: Kathy Dominguez MR#: [id number] Date of Birth: 1928-09-16 Date of Admission: 01/28/2016  Referring MD/NP/PA: Dr. Kerman Passey Primary Care Physician: Einar Pheasant, MD Patient coming from: Home  Chief Complaint: SOB, LE edema  HPI: Kathy Dominguez is an 83 y.o. female with a known history of hypertension, hyperlipidemia, osteoporosis presents to the emergency department for evaluation of shortness of breath and lower extremity edema.  Patient was in a usual state of health until about 10 days ago when she notices progressively worsening swelling in bilateral lower extremities isolated to the feet and ankles. She reports associated shortness of breath with dyspnea on mild exertion. She reports 2 pillow orthopnea She denies any dietary indiscretion over the holidays but her symptoms did begin right after Christmas. Patient denies any history of congestive heart failure, states that she has not ever seen a cardiologist or had any cardiac workup.   Otherwise there has been no change in status. Patient has been taking medication as prescribed and there has been no recent change in medication or diet.  There has been no recent illness, travel or sick contacts.    Patient denies fevers/chills, weakness, dizziness, chest pain, N/V/C/D, abdominal pain, dysuria/frequency, changes in mental status.   Review of Systems:  CONSTITUTIONAL: No fever/chills, fatigue, weakness, weight gain/loss, headache. EYES: No blurry or double vision. ENT: No tinnitus, postnasal drip, redness or soreness of the oropharynx. RESPIRATORY: No cough, , wheeze, hemoptysis. Positive dyspnea on exertion and shortness of breath CARDIOVASCULAR: No chest pain, palpitations, syncope, orthopnea, positive lower extremity edema GASTROINTESTINAL: No nausea, vomiting, abdominal pain, constipation, diarrhea.   No hematemesis, melena or hematochezia. GENITOURINARY: No dysuria, frequency, hematuria. ENDOCRINE: No polyuria or nocturia. No heat or cold intolerance. HEMATOLOGY: No anemia, bruising, bleeding. INTEGUMENTARY: No rashes, ulcers, lesions. MUSCULOSKELETAL: No arthritis, gout, dyspnea.  NEUROLOGIC: No numbness, tingling, ataxia, seizure-type activity, weakness. PSYCHIATRIC: No anxiety, depression, insomnia.   Past Medical History:  Diagnosis Date  . Hypercholesterolemia   . Hypertension   . Osteoporosis     Past Surgical History:  Procedure Laterality Date  . ABDOMINAL HYSTERECTOMY    . THYROIDECTOMY, PARTIAL  1956  . TUBAL LIGATION       reports that she has never smoked. She has never used smokeless tobacco. She reports that she does not drink alcohol or use drugs.  No Known Allergies  Family History  Problem Relation Age of Onset  . Stroke Mother   . Colon cancer Father   . Breast cancer Daughter 42  . Breast cancer Cousin    Family history has been reviewed and confirmed with patient.   Prior to Admission medications   Medication Sig Start Date End Date Taking? Authorizing Provider  albuterol (PROVENTIL HFA;VENTOLIN HFA) 108 (90 Base) MCG/ACT inhaler Inhale 2 puffs into the lungs every 6 (six) hours as needed for wheezing or shortness of breath. 12/04/15  Yes Einar Pheasant, MD  aspirin (ASPIRIN EC) 81 MG EC tablet Take 81 mg by mouth daily. Swallow whole.   Yes Historical Provider, MD  Cholecalciferol (VITAMIN D-3) 1000 UNITS CAPS Take 2,000 Units by mouth daily.   Yes Historical Provider, MD  meloxicam (MOBIC) 15 MG tablet Take 15 mg by mouth daily.   Yes Historical Provider, MD  telmisartan (MICARDIS) 80 MG tablet Take 1 tablet (80 mg total) by mouth daily. 08/29/15  Yes Einar Pheasant, MD  Physical Exam: Vitals:   01/28/16 1715 01/28/16 2124 01/28/16 2200  BP: (!) 159/95 (!) 171/102 (!) 157/95  Pulse: 98 91 88  Resp: 20 (!) 27 (!) 23  Temp: 97.5 F (36.4 C)     TempSrc: Oral    SpO2: 96% (!) 88% 100%  Weight: 67.6 kg (149 lb)    Height: 5\' 2"  (1.575 m)      GENERAL: 83 y.o.-year-old White female patient, well-developed, well-nourished lying in the bed in no acute distress.  Pleasant and cooperative.   HEENT: Head atraumatic, normocephalic. Pupils equal, round, reactive to light and accommodation. No scleral icterus. Extraocular muscles intact. Nares are patent. Oropharynx is clear. Mucus membranes moist. NECK: Supple, full range of motion. No JVD, no bruit heard. No thyroid enlargement, no tenderness, no cervical lymphadenopathy. CHEST: Normal breath sounds bilaterally. No wheezing, rales, rhonchi or crackles. No use of accessory muscles of respiration.  No reproducible chest wall tenderness.  CARDIOVASCULAR: S1, S2 normal. No murmurs, rubs, or gallops. Cap refill <2 seconds. Pulses intact distally.  ABDOMEN: Soft, nondistended, nontender, . No rebound, guarding, rigidity. Normoactive bowel sounds present in all four quadrants. No organomegaly or mass. EXTREMITIES: Pitting edema bilateral lower extremities to the ankle. Negative calf tenderness NEUROLOGIC: Cranial nerves II through XII are grossly intact with no focal sensorimotor deficit. Muscle strength 5/5 in all extremities. Sensation intact. Gait not checked. PSYCHIATRIC: The patient is alert and oriented x 3. Normal affect, mood, thought content. SKIN: Warm, dry, and intact without obvious rash, lesion, or ulcer.   Labs on Admission: CBC:  Recent Labs Lab 01/28/16 1728  WBC 3.8  HGB 13.5  HCT 40.9  MCV 90.8  PLT 0000000   Basic Metabolic Panel:  Recent Labs Lab 01/28/16 1728  NA 139  K 3.8  CL 101  CO2 33*  GLUCOSE 106*  BUN 18  CREATININE 0.77  CALCIUM 9.0   GFR: Estimated Creatinine Clearance: 46.3 mL/min (by C-G formula based on SCr of 0.77 mg/dL). Liver Function Tests: No results for input(s): AST, ALT, ALKPHOS, BILITOT, PROT, ALBUMIN in the last 168 hours. No  results for input(s): LIPASE, AMYLASE in the last 168 hours. No results for input(s): AMMONIA in the last 168 hours. Coagulation Profile: No results for input(s): INR, PROTIME in the last 168 hours. Cardiac Enzymes:  Recent Labs Lab 01/28/16 1728  TROPONINI <0.03   BNP (last 3 results) No results for input(s): PROBNP in the last 8760 hours. HbA1C: No results for input(s): HGBA1C in the last 72 hours. CBG: No results for input(s): GLUCAP in the last 168 hours. Lipid Profile: No results for input(s): CHOL, HDL, LDLCALC, TRIG, CHOLHDL, LDLDIRECT in the last 72 hours. Thyroid Function Tests: No results for input(s): TSH, T4TOTAL, FREET4, T3FREE, THYROIDAB in the last 72 hours. Anemia Panel: No results for input(s): VITAMINB12, FOLATE, FERRITIN, TIBC, IRON, RETICCTPCT in the last 72 hours. Urine analysis:    Component Value Date/Time   COLORURINE YELLOW 02/20/2015 0945   APPEARANCEUR CLEAR 02/20/2015 0945   LABSPEC <=1.005 (A) 02/20/2015 0945   PHURINE 6.0 02/20/2015 0945   GLUCOSEU NEGATIVE 02/20/2015 0945   HGBUR NEGATIVE 02/20/2015 0945   BILIRUBINUR NEGATIVE 02/20/2015 0945   KETONESUR NEGATIVE 02/20/2015 0945   UROBILINOGEN 0.2 02/20/2015 0945   NITRITE NEGATIVE 02/20/2015 0945   LEUKOCYTESUR NEGATIVE 02/20/2015 0945   Sepsis Labs: @LABRCNTIP (procalcitonin:4,lacticidven:4) )No results found for this or any previous visit (from the past 240 hour(s)).   Radiological Exams on Admission: Dg Chest 2 View  Result Date: 01/28/2016 CLINICAL DATA:  Increasing swelling to the lower extremities with shortness of breath EXAM: CHEST  2 VIEW COMPARISON:  CT 01/25/2007 FINDINGS: Coarse interstitial pattern within the bilateral lung bases, likely represents scarring and chronic interstitial change. No acute consolidation or pleural effusion. There is thickening along the fissure on the lateral image. Moderate cardiomegaly with minimal central congestion. No overt pulmonary edema. No  pneumothorax. IMPRESSION: 1. Cardiomegaly with mild central congestion but no overt pulmonary edema. 2. Probable scarring/fibrosis within the bilateral lung bases. Electronically Signed   By: Donavan Foil M.D.   On: 01/28/2016 18:07    EKG: Normal sinus rhythm at 91 bpm with leftward axis and nonspecific ST-T wave changes.   Assessment/Plan   This is an 83 y.o. female with a history of hypertension, hyperlipidemia, osteoporosis now being admitted with: 1. New onset acute congestive heart failure -patient was found to have an elevated BNP, pulmonary vascular congestion on chest x-ray and symptoms consistent with new onset CHF. She was also found to have a new oxygen requirement with O2 sat of 89% on room air - Telemetry monitoring. - Diuresis, intake/output, daily weight. - Trend troponins, check lipids and TSH. - Check echocardiogram - Cardiology consultation requested -Continue aspirin, ARB. Will add beta blocker 2. History of hypertension-continue Micardis 3. History of osteoporosis and osteoarthritis-continue vitamin D3 and Mobitz  Admission status: Inpatient, telemetry IV Fluids: Hep-Lock Diet/Nutrition: Heart healthy Consults called: Cardiology  DVT Px: Lovenox, SCDs and early ambulation Code Status: Full Code  Disposition Plan: To home in 1-2 days   All the records are reviewed and case discussed with ED provider. Management plans discussed with the patient and/or family who express understanding and agree with plan of care.  Princess Karnes D.O. on 01/28/2016 at 10:13 PM Between 7am to 6pm - Pager - [phone] After 6pm go to [email or web address] - [employer] [facility] [place] [facility] Office [phone] CC: Primary care physician; Einar Pheasant, MD   01/28/2016, 10:13 PM

## 2016-01-28 NOTE — Telephone Encounter (Signed)
Pt called looking to make an appt to see someone. Pt is c/o of SOB and leg and ankle swelling. Sent call to [facility] Triage.

## 2016-01-28 NOTE — ED Notes (Signed)
Pt lying on side, 2 L nasal cannula still on. Lights dimmed for comfort.

## 2016-01-28 NOTE — ED Notes (Signed)
Pt oxygen dropping to 88% RA, placed on 2 L nasal cannula came up to 100%.

## 2016-01-29 ENCOUNTER — Inpatient Hospital Stay: Payer: Medicare HMO

## 2016-01-29 ENCOUNTER — Inpatient Hospital Stay (HOSPITAL_COMMUNITY)
Admit: 2016-01-29 | Discharge: 2016-01-29 | Disposition: A | Discharge status: Home / Self Care | Payer: Medicare HMO | Attending: Cardiovascular Disease | Admitting: Cardiovascular Disease

## 2016-01-29 DIAGNOSIS — R0602 Shortness of breath: Secondary | ICD-10-CM

## 2016-01-29 DIAGNOSIS — I509 Heart failure, unspecified: Secondary | ICD-10-CM

## 2016-01-29 DIAGNOSIS — J9602 Acute respiratory failure with hypercapnia: Secondary | ICD-10-CM | POA: Diagnosis present

## 2016-01-29 DIAGNOSIS — I10 Essential (primary) hypertension: Secondary | ICD-10-CM

## 2016-01-29 DIAGNOSIS — J9601 Acute respiratory failure with hypoxia: Secondary | ICD-10-CM

## 2016-01-29 LAB — TROPONIN I
Troponin I: <0.03 ng/mL (ref <0.03)
Troponin I: <0.03 ng/mL (ref <0.03)
Troponin I: <0.03 ng/mL (ref <0.03)

## 2016-01-29 LAB — CBC
HCT: 44.7 % (ref 35.0–47.0)
Hemoglobin: 14.4 g/dL (ref 12.0–16.0)
MCH: 30.3 pg (ref 26.0–34.0)
MCHC: 32.3 g/dL (ref 32.0–36.0)
MCV: 93.8 fL (ref 80.0–100.0)
Platelets: 171 10*3/uL (ref 150–440)
RBC: 4.77 MIL/uL (ref 3.80–5.20)
RDW: 14.5 % (ref 11.5–14.5)
WBC: 3.6 10*3/uL (ref 3.6–11.0)

## 2016-01-29 LAB — BLOOD GAS, ARTERIAL
ACID-BASE EXCESS: 10.1 mmol/L — AB (ref 0.0–2.0)
Bicarbonate: 38.6 mmol/L — ABNORMAL HIGH (ref 20.0–28.0)
Delivery systems: POSITIVE
Expiratory PAP: 5
FIO2: 0.28
Inspiratory PAP: 14
O2 SAT: 92.3 %
PCO2 ART: 70 mmHg — AB (ref 32.0–48.0)
PO2 ART: 68 mmHg — AB (ref 83.0–108.0)
Patient temperature: 37
RATE: 8 resp/min
pH, Arterial: 7.35 (ref 7.350–7.450)

## 2016-01-29 LAB — BASIC METABOLIC PANEL
Anion gap: 3 — ABNORMAL LOW (ref 5–15)
BUN: 15 mg/dL (ref 6–20)
CO2: 34 mmol/L — ABNORMAL HIGH (ref 22–32)
CREATININE: 0.74 mg/dL (ref 0.44–1.00)
Calcium: 8.9 mg/dL (ref 8.9–10.3)
Chloride: 104 mmol/L (ref 101–111)
Glucose, Bld: 110 mg/dL — ABNORMAL HIGH (ref 65–99)
Potassium: 3.7 mmol/L (ref 3.5–5.1)
SODIUM: 141 mmol/L (ref 135–145)

## 2016-01-29 LAB — MAGNESIUM: Magnesium: 1.9 mg/dL (ref 1.7–2.4)

## 2016-01-29 LAB — TSH: TSH: 0.588 u[IU]/mL (ref 0.350–4.500)

## 2016-01-29 MED ORDER — METOPROLOL SUCCINATE ER 25 MG PO TB24
12.5000 mg | ORAL_TABLET | Freq: Every day | ORAL | Status: DC
  Filled 2016-01-29: qty 1

## 2016-01-29 MED ORDER — NITROGLYCERIN 2 % TD OINT
1.0000 [in_us] | TOPICAL_OINTMENT | Freq: Four times a day (QID) | TRANSDERMAL | Status: DC
  Administered 2016-01-29 – 2016-01-31 (×8): 1 [in_us] via TOPICAL
  Filled 2016-01-29 (×10): qty 1

## 2016-01-29 MED ORDER — IRBESARTAN 75 MG PO TABS
300.0000 mg | ORAL_TABLET | Freq: Every day | ORAL | Status: DC
  Administered 2016-01-29 – 2016-01-30 (×2): 300 mg via ORAL
  Filled 2016-01-29 (×3): qty 4

## 2016-01-29 MED ORDER — SODIUM CHLORIDE 0.9% FLUSH: 3.0000 mL | INTRAVENOUS | Status: DC | PRN

## 2016-01-29 MED ORDER — FUROSEMIDE 10 MG/ML IJ SOLN
20.0000 mg | Freq: Once | INTRAMUSCULAR | Status: AC
  Administered 2016-01-29: 20 mg via INTRAVENOUS
  Filled 2016-01-29: qty 2

## 2016-01-29 MED ORDER — FUROSEMIDE 10 MG/ML IJ SOLN
40.0000 mg | Freq: Three times a day (TID) | INTRAMUSCULAR | Status: DC
  Administered 2016-01-29 – 2016-01-30 (×3): 40 mg via INTRAVENOUS
  Filled 2016-01-29 (×3): qty 4

## 2016-01-29 MED ORDER — SODIUM CHLORIDE 0.9 % IV SOLN: 250.0000 mL | INTRAVENOUS | Status: DC | PRN

## 2016-01-29 MED ORDER — MELOXICAM 7.5 MG PO TABS
15.0000 mg | ORAL_TABLET | Freq: Every day | ORAL | Status: DC
  Administered 2016-01-30: 15 mg via ORAL
  Filled 2016-01-29 (×3): qty 2

## 2016-01-29 MED ORDER — FUROSEMIDE 10 MG/ML IJ SOLN: 40.0000 mg | Freq: Two times a day (BID) | INTRAMUSCULAR | Status: DC

## 2016-01-29 MED ORDER — ENOXAPARIN SODIUM 40 MG/0.4ML ~~LOC~~ SOLN
40.0000 mg | Freq: Every day | SUBCUTANEOUS | Status: DC
  Administered 2016-01-29 – 2016-02-02 (×6): 40 mg via SUBCUTANEOUS
  Filled 2016-01-29 (×6): qty 0.4

## 2016-01-29 MED ORDER — FUROSEMIDE 10 MG/ML IJ SOLN
20.0000 mg | Freq: Once | INTRAMUSCULAR | Status: AC
  Administered 2016-01-29: 20 mg via INTRAVENOUS

## 2016-01-29 MED ORDER — ACETAMINOPHEN 325 MG PO TABS: 650.0000 mg | ORAL_TABLET | ORAL | Status: DC | PRN

## 2016-01-29 MED ORDER — VITAMIN D 1000 UNITS PO TABS
2000.0000 [IU] | ORAL_TABLET | Freq: Every day | ORAL | Status: DC
  Administered 2016-01-29 – 2016-02-03 (×6): 2000 [IU] via ORAL
  Filled 2016-01-29 (×7): qty 2

## 2016-01-29 MED ORDER — ONDANSETRON HCL 4 MG/2ML IJ SOLN: 4.0000 mg | Freq: Four times a day (QID) | INTRAMUSCULAR | Status: DC | PRN

## 2016-01-29 MED ORDER — ASPIRIN EC 81 MG PO TBEC
81.0000 mg | DELAYED_RELEASE_TABLET | Freq: Every day | ORAL | Status: DC
  Administered 2016-01-29 – 2016-02-03 (×6): 81 mg via ORAL
  Filled 2016-01-29 (×7): qty 1

## 2016-01-29 MED ORDER — FUROSEMIDE 10 MG/ML IJ SOLN
20.0000 mg | Freq: Every day | INTRAMUSCULAR | Status: DC
  Administered 2016-01-29: 20 mg via INTRAVENOUS
  Filled 2016-01-29: qty 2

## 2016-01-29 MED ORDER — SODIUM CHLORIDE 0.9% FLUSH
3.0000 mL | Freq: Two times a day (BID) | INTRAVENOUS | Status: DC
  Administered 2016-01-29 – 2016-02-03 (×12): 3 mL via INTRAVENOUS

## 2016-01-29 NOTE — Care Management (Signed)
Presents from home with shortness of breath. Independent in all adls, denies issues accessing medical care, obtaining medications or with transportation.  Current with her PCP.  02 requirements are actue.  Required bipap earlier in the day.  spoke with patient and her daughter. Does not want to consider skilled nursing placement. Would be agreeable to home health services.  SX are too acute to attempt to wean and assess for need of home 02.  Patient becomes short of breath with conversation and nasal cannula. COPD and CHF dx would be a new diagnosis for patient

## 2016-01-29 NOTE — Progress Notes (Signed)
Foley catheter inserted as per order, patient tolerated , family at bedside

## 2016-01-29 NOTE — Telephone Encounter (Signed)
Patient admitted to [facility].

## 2016-01-29 NOTE — Consult Note (Signed)
Cardiology Consultation Note  [name], MRN: [id number], DOB/AGE: [birth year] 81 y.o. Admit date: 01/28/2016   Date of Consult: 01/29/2016 Primary Physician: Einar Pheasant, MD Primary Cardiologist: New to [facility] Requesting Physician: Dr. Ara Kussmaul, DO  Chief Complaint: SOB/LE swelling x 10 days Reason for Consult: Same  HPI: 83 y.o. female with h/o HTN, HLD, and osteoporosis who presented to [facility] on 1/3 with 14 days of increased SOB and LE, worse over the past couple of days.  No prior known cardiac history and has never seen a cardiologist before. Over the past 2 weeks she has noticed increased SOB, LE swelling, and has been using 2 pillows for sleep (baseline uses 1 pillow). No chest pain. Denied dietary indiscretions over the Christmas holiday. She has previously been seen by PCP in 2016 for SOB which was successfully treated with prednisone. She was never a smoker, though her husband smoked around her.   Upon the patient's arrival to [facility] they were found to be hypoxic with O2 saturations of 89% on room air and were placed on nasal cannula. BNP of 795, SCr 0.77, K+ 3.8-->3.7, unremarkable CBC, troponin negative x 2, normal TSH, magnesium 1.9. ECG as below, CXR showed cardiomegaly with mild vascular congestion with probable fibrosis of the lung bases. She was given IV Lasix 40 mg in the ED. This morning she became SOB with O2 saturations of 95% on 3 L nasal cannula. Stat CXR was performed that showed pulmonary interstitial edema with early alveolar edema with underlying COPD (never a smoker, though husband smoked around her). ABG showed hypercapnia with pCO2 of 88, pO2 of 123 on HFNC. She was given IV Lasix 20 mg x 3 this AM. She was placed on BiPAP 2/2 increased work of breathing, nitropaste was applied, and she was transferred to the ICU. After starting BiPAP her RR improved from 36/min to 19/min.  Past Medical History:  Diagnosis Date  . Hypercholesterolemia   . Hypertension     . Osteoporosis       Most Recent Cardiac Studies: Echo pending.   Surgical History:  Past Surgical History:  Procedure Laterality Date  . ABDOMINAL HYSTERECTOMY    . THYROIDECTOMY, PARTIAL  1956  . TUBAL LIGATION       Home Meds: Prior to Admission medications   Medication Sig Start Date End Date Taking? Authorizing Provider  albuterol (PROVENTIL HFA;VENTOLIN HFA) 108 (90 Base) MCG/ACT inhaler Inhale 2 puffs into the lungs every 6 (six) hours as needed for wheezing or shortness of breath. 12/04/15  Yes Einar Pheasant, MD  aspirin (ASPIRIN EC) 81 MG EC tablet Take 81 mg by mouth daily. Swallow whole.   Yes Historical Provider, MD  Cholecalciferol (VITAMIN D-3) 1000 UNITS CAPS Take 2,000 Units by mouth daily.   Yes Historical Provider, MD  meloxicam (MOBIC) 15 MG tablet Take 15 mg by mouth daily.   Yes Historical Provider, MD  telmisartan (MICARDIS) 80 MG tablet Take 1 tablet (80 mg total) by mouth daily. 08/29/15  Yes Einar Pheasant, MD    Inpatient Medications:  . aspirin EC  81 mg Oral Daily  . cholecalciferol  2,000 Units Oral Daily  . enoxaparin (LOVENOX) injection  40 mg Subcutaneous QHS  . furosemide  40 mg Intravenous Q8H  . irbesartan  300 mg Oral Daily  . meloxicam  15 mg Oral Daily  . nitroGLYCERIN  1 inch Topical Q6H  . sodium chloride flush  3 mL Intravenous Q12H     Allergies: No Known  Allergies  Social History   Social History  . Marital status: Widowed    Spouse name: N/A  . Number of children: N/A  . Years of education: N/A   Occupational History  . Not on file.   Social History Main Topics  . Smoking status: Never Smoker  . Smokeless tobacco: Never Used  . Alcohol use No  . Drug use: No  . Sexual activity: Not on file   Other Topics Concern  . Not on file   Social History Narrative  . No narrative on file     Family History  Problem Relation Age of Onset  . Stroke Mother   . Colon cancer Father   . Breast cancer Daughter 51  . Breast  cancer Cousin      Review of Systems: Review of Systems  Constitutional: Positive for malaise/fatigue. Negative for chills, diaphoresis, fever and weight loss.  HENT: Negative for congestion.   Eyes: Negative for discharge and redness.  Respiratory: Positive for cough and shortness of breath. Negative for hemoptysis, sputum production and wheezing.   Cardiovascular: Positive for orthopnea and leg swelling. Negative for chest pain, palpitations, claudication and PND.  Gastrointestinal: Negative for abdominal pain, blood in stool, heartburn, melena, nausea and vomiting.  Genitourinary: Negative for hematuria.  Musculoskeletal: Negative for falls and myalgias.  Skin: Negative for rash.  Neurological: Positive for weakness. Negative for dizziness, tingling, tremors, sensory change, speech change, focal weakness and loss of consciousness.  Endo/Heme/Allergies: Does not bruise/bleed easily.  Psychiatric/Behavioral: Negative for substance abuse. The patient is not nervous/anxious.   All other systems reviewed and are negative.   Labs:  Recent Labs  01/28/16 1728 01/29/16 0404  TROPONINI <0.03 <0.03   Lab Results  Component Value Date   WBC 3.6 01/29/2016   HGB 14.4 01/29/2016   HCT 44.7 01/29/2016   MCV 93.8 01/29/2016   PLT 171 01/29/2016     Recent Labs Lab 01/29/16 0404  NA 141  K 3.7  CL 104  CO2 34*  BUN 15  CREATININE 0.74  CALCIUM 8.9  GLUCOSE 110*   Lab Results  Component Value Date   CHOL 228 (H) 12/01/2015   HDL 69.80 12/01/2015   LDLCALC 144 (H) 12/01/2015   TRIG 69.0 12/01/2015   No results found for: DDIMER  Radiology/Studies:  Dg Chest 1 View  Result Date: 01/29/2016 IMPRESSION: Pulmonary interstitial edema. Early alveolar edema versus pneumonia in the inferior aspect of the right upper lobe. Underlying COPD. Thoracic aortic atherosclerosis. Electronically Signed   By: David  Martinique M.D.   On: 01/29/2016 09:21   Dg Chest 2 View  Result Date:  01/28/2016 IMPRESSION: 1. Cardiomegaly with mild central congestion but no overt pulmonary edema. 2. Probable scarring/fibrosis within the bilateral lung bases. Electronically Signed   By: Donavan Foil M.D.   On: 01/28/2016 18:07    EKG: Interpreted by me showed: NSR, 91 bpm, LVH, poor R wave progression, nonspecific st/t changes Telemetry: Interpreted by me showed: sinus tachycardia, low 100's bpm current with episodes into the 120's bpm, short run of atrial tach  Weights: Filed Weights   01/28/16 1715 01/29/16 0327  Weight: 149 lb (67.6 kg) 136 lb 6.4 oz (61.9 kg)     Physical Exam: Blood pressure (!) 158/80, pulse (!) 110, temperature 98.3 F (36.8 C), temperature source Oral, resp. rate (!) 24, height 5\' 2"  (1.575 m), weight 136 lb 6.4 oz (61.9 kg), last menstrual period 02/21/1966, SpO2 95 %. Body mass index is  24.95 kg/m. General: Ill appearing. Head: Normocephalic, atraumatic, sclera non-icteric, no xanthomas, nares are without discharge.  Neck: Negative for carotid bruits. JVD elevated ~ 10 cm. Lungs: Tachypneic.Decreased breath sounds bilaterally with diffuse crackles. Heart: Tachycardic with S1 S2. No murmurs, rubs, or gallops appreciated. Abdomen: Soft, non-tender, non-distended with normoactive bowel sounds. No hepatomegaly. No rebound/guarding. No obvious abdominal masses. Msk:  Strength and tone appear normal for age. Extremities: No clubbing or cyanosis. 2+ pitting edema to the bilateral thighs. Distal pedal pulses are 2+ and equal bilaterally. Neuro: Alert and oriented X 3. No facial asymmetry. No focal deficit. Moves all extremities spontaneously. Psych:  Responds to questions appropriately with a normal affect.    Assessment and Plan:  Principal Problem:   Acute respiratory failure ([place]) Active Problems:   Acute congestive heart failure (HCC)   Hypertension    1. Acute respiratory failure with hypercapnia and respiratory acidosis: -Requiring BiPAP this morning  2/2 SOB and increased work of breathing -Possibly multifactorial including acute decompensated CHF and possible COPD (husband smoked around her) -Transferred to the ICU  -Wean oxygen as able -Monitor for need to intubate  2. Acute decompensated CHF: -Worsening SOB this morning requiring  -Has received IV Lasix 40 mg in the ED as well as 20 mg x3 this morning -BiPAP as above -Add nitropaste 1 inch -IV Lasix 40 mg q 8 hours -Could add morphine  -Stop beta blocker given she is decompensated and not on beta blocker at home -Check echo -Trend CXR and BNP -Sinus tachycardia likely in the setting of the above  3. Possible COPD: -Unlikely to be solely CHF given the degree of CO2 retention, though cannot exclude possible central process leading to decreased respiratory drive -Consider nebs when off BiPAP  -Per IM  4. HTN: -Nitropaste and Lasix as above   Signed, Christell Faith, PA-C [facility] Pager: [phone] 01/29/2016, 10:21 AM

## 2016-01-29 NOTE — Progress Notes (Signed)
PT Cancellation Note  Patient Details Name: Kathy Dominguez MRN: [id number] DOB: [birth year]   Cancelled Treatment:    Reason Eval/Treat Not Completed: Patient not medically ready (transferring to ICU).  Will request MD to reorder PT when pt is able to be seen.   Ramond Dial 01/29/2016, 2:36 PM   Mee Hives, PT MS Acute Rehab Dept. Number: [place] and [place]

## 2016-01-29 NOTE — Progress Notes (Signed)
Patient very short of breath, sat 95 % on 3L [place], Dr notified, patient alert and responsive, 20 mg lasix administer, blood gaz,and p chest x ray order

## 2016-01-29 NOTE — Progress Notes (Signed)
Md called with blood gaz result, patient paced on bipap as per order, waiting for transfer to the unit

## 2016-01-29 NOTE — Progress Notes (Signed)
Patient resting in the room at this time, her breathing as improved, family at bedside

## 2016-01-29 NOTE — ED Notes (Signed)
Pt asleep lying on side, nasal cannula still on. Wrapped blanket around pt. Lights dimmed for comfort.

## 2016-01-29 NOTE — Progress Notes (Signed)
[place] at [place] NAME: Kathy Dominguez    MR#:  [id number]  DATE OF BIRTH:  [birth year]  SUBJECTIVE:   Patient here due to shortness of breath from congestive heart failure. Noted to have acute on chronic hypoxic respiratory failure with hypercarbia and placed on BiPAP. ABG improved with BiPAP. Given extra dose of diuretics and has clinically improved.  REVIEW OF SYSTEMS:    Review of Systems  Constitutional: Negative for chills and fever.  HENT: Negative for congestion and tinnitus.   Eyes: Negative for blurred vision and double vision.  Respiratory: Positive for shortness of breath and wheezing. Negative for cough.   Cardiovascular: Negative for chest pain, orthopnea and PND.  Gastrointestinal: Negative for abdominal pain, diarrhea, nausea and vomiting.  Genitourinary: Negative for dysuria and hematuria.  Neurological: Negative for dizziness, sensory change and focal weakness.  All other systems reviewed and are negative.   Nutrition: Heart Healthy Tolerating Diet: Heart Healthy Tolerating PT: Await Eval.    DRUG ALLERGIES:  No Known Allergies  VITALS:  Blood pressure (!) 158/80, pulse (!) 110, temperature 98.3 F (36.8 C), temperature source Oral, resp. rate (!) 24, height 5\' 2"  (1.575 m), weight 61.9 kg (136 lb 6.4 oz), last menstrual period 02/21/1966, SpO2 95 %.  PHYSICAL EXAMINATION:   Physical Exam  GENERAL:  83 y.o.-year-old patient lying in the bed in mild resp. Disters.   EYES: Pupils equal, round, reactive to light and accommodation. No scleral icterus. Extraocular muscles intact.  HEENT: Head atraumatic, normocephalic. Oropharynx and nasopharynx clear.  NECK:  Supple, no jugular venous distention. No thyroid enlargement, no tenderness.  LUNGS: Lungs inspiratory and expiratory phase, minimal end expiratory wheezing bilaterally, bibasilar Rales. No use of accessory muscles of respiration.  CARDIOVASCULAR: S1, S2  normal. No murmurs, rubs, or gallops.  ABDOMEN: Soft, nontender, nondistended. Bowel sounds present. No organomegaly or mass.  EXTREMITIES: No cyanosis, clubbing or edema b/l.    NEUROLOGIC: Cranial nerves II through XII are intact. No focal Motor or sensory deficits b/l.   PSYCHIATRIC: The patient is alert and oriented x 3.  SKIN: No obvious rash, lesion, or ulcer.    LABORATORY PANEL:   CBC  Recent Labs Lab 01/29/16 0404  WBC 3.6  HGB 14.4  HCT 44.7  PLT 171   ------------------------------------------------------------------------------------------------------------------  Chemistries   Recent Labs Lab 01/29/16 0404  NA 141  K 3.7  CL 104  CO2 34*  GLUCOSE 110*  BUN 15  CREATININE 0.74  CALCIUM 8.9  MG 1.9   ------------------------------------------------------------------------------------------------------------------  Cardiac Enzymes  Recent Labs Lab 01/29/16 1528  TROPONINI <0.03   ------------------------------------------------------------------------------------------------------------------  RADIOLOGY:  Dg Chest 1 View  Result Date: 01/29/2016 CLINICAL DATA:  Shortness of breath with exertion and peripheral edema which began about 10 days ago EXAM: CHEST 1 VIEW COMPARISON:  PA and lateral chest x-ray of January 28, 2016. FINDINGS: The lungs remain hyperinflated. The interstitial markings are more conspicuous bilaterally. Patchy confluent density in the inferior aspect of the right upper lobe may be developing. There is no pleural effusion. The cardiac silhouette is enlarged. The pulmonary vascularity is engorged. There is tortuosity of the descending thoracic aorta. The trachea is midline. There is calcification in the wall of the aortic arch. The bony thorax exhibits no acute abnormality. IMPRESSION: Pulmonary interstitial edema. Early alveolar edema versus pneumonia in the inferior aspect of the right upper lobe. Underlying COPD. Thoracic aortic  atherosclerosis. Electronically Signed   By: Shanon Brow  Martinique M.D.   On: 01/29/2016 09:21   Dg Chest 2 View  Result Date: 01/28/2016 CLINICAL DATA:  Increasing swelling to the lower extremities with shortness of breath EXAM: CHEST  2 VIEW COMPARISON:  CT 01/25/2007 FINDINGS: Coarse interstitial pattern within the bilateral lung bases, likely represents scarring and chronic interstitial change. No acute consolidation or pleural effusion. There is thickening along the fissure on the lateral image. Moderate cardiomegaly with minimal central congestion. No overt pulmonary edema. No pneumothorax. IMPRESSION: 1. Cardiomegaly with mild central congestion but no overt pulmonary edema. 2. Probable scarring/fibrosis within the bilateral lung bases. Electronically Signed   By: Donavan Foil M.D.   On: 01/28/2016 18:07     ASSESSMENT AND PLAN:   83 year old female with past medical history of retention, hyperlipidemia, osteoporosis who presented to the hospital due to shortness of breath. Next  1. Acute respiratory failure with hypoxia and hypercapnia-secondary to underlying CHF and also underlying COPD. -Placed on BiPAP earlier this morning and improved. We'll continue BiPAP at bedtime. -Continue diuresis with IV Lasix. Continue minimal O2 to keep O2 sats greater than 90%  2. Acute CHF - appreciate Cards input.  - cont. Diuresis with IV lasix and follow I's and O's and daily weights.  - cont. Micardis. Hold B-blocker for as per Cards  3. COPD - due to second hand smoke exposure.  - assess for Home O2 prior to discharge. No acute exacerbation.  - PRN duonebs.   4. OA - cont. Meloxicam    All the records are reviewed and case discussed with Care Management/Social Worker. Management plans discussed with the patient, family and they are in agreement.  CODE STATUS: Full code  DVT Prophylaxis: Lovenox  TOTAL Critical Care TIME TAKING CARE OF THIS PATIENT: 35 minutes.   POSSIBLE D/C IN 2-3 DAYS,  DEPENDING ON CLINICAL CONDITION.   Henreitta Leber M.D on 01/29/2016 at 5:07 PM  Between 7am to 6pm - Pager - [phone]  After 6pm go to [email or web address] - Proofreader  [facility] [place]  Office  [phone]  CC: Primary care physician; Einar Pheasant, MD

## 2016-01-29 NOTE — Progress Notes (Signed)
Patient requested an Advance Directives. Chaplain on-call provided education to the patient. Communication was not smooth due to the breathing mask the patient had, but she could clearly hear what Chaplain said.

## 2016-01-29 NOTE — Care Management (Signed)
Chaplain Care Team followed up before leaving for the day. I will touch basis with the team to make sure they are available if anything is needed. Patient did not desire anything at the present moment. Family was bedside and fine as well.

## 2016-01-30 DIAGNOSIS — I5021 Acute systolic (congestive) heart failure: Secondary | ICD-10-CM

## 2016-01-30 DIAGNOSIS — I471 Supraventricular tachycardia: Secondary | ICD-10-CM

## 2016-01-30 DIAGNOSIS — I110 Hypertensive heart disease with heart failure: Secondary | ICD-10-CM

## 2016-01-30 DIAGNOSIS — I4729 Other ventricular tachycardia: Secondary | ICD-10-CM

## 2016-01-30 DIAGNOSIS — I472 Ventricular tachycardia: Secondary | ICD-10-CM

## 2016-01-30 DIAGNOSIS — E876 Hypokalemia: Secondary | ICD-10-CM

## 2016-01-30 DIAGNOSIS — I4719 Other supraventricular tachycardia: Secondary | ICD-10-CM

## 2016-01-30 LAB — BLOOD GAS, ARTERIAL
ACID-BASE EXCESS: 5.9 mmol/L — AB (ref 0.0–2.0)
BICARBONATE: 36.9 mmol/L — AB (ref 20.0–28.0)
FIO2: 0.32
O2 Saturation: 98.1 %
PCO2 ART: 88 mmHg — AB (ref 32.0–48.0)
PH ART: 7.23 — AB (ref 7.350–7.450)
PO2 ART: 123 mmHg — AB (ref 83.0–108.0)
Patient temperature: 37

## 2016-01-30 LAB — ECHOCARDIOGRAM COMPLETE
E/e' ratio: 15.51
EWDT: 127 ms
FS: 12 % — AB (ref 28–44)
HEIGHTINCHES: 62 in
IVS/LV PW RATIO, ED: 0.67
LA ID, A-P, ES: 45 mm
LA diam end sys: 45 mm
LA diam index: 2.71 cm/m2
LA vol A4C: 81.7 ml
LA vol index: 50.6 mL/m2
LA vol: 83.8 mL
LV TDI E'LATERAL: 6.64
LV TDI E'MEDIAL: 4.9
LV dias vol index: 126 mL/m2
LVDIAVOL: 209 mL — AB (ref 46–106)
LVEEAVG: 15.51
LVEEMED: 15.51
LVELAT: 6.64 cm/s
LVSYSVOL: 170 mL — AB (ref 14–42)
LVSYSVOLIN: 103 mL/m2
MV Dec: 127
MV Peak grad: 4 mmHg
MV pk E vel: 103 m/s
MVPKAVEL: 93.6 m/s
PW: 12 mm — AB (ref 0.6–1.1)
RV LATERAL S' VELOCITY: 13.7 cm/s
RV TAPSE: 21.7 mm
Simpson's disk: 19
Stroke v: 39 ml
WEIGHTICAEL: 2182.4 [oz_av]

## 2016-01-30 LAB — BASIC METABOLIC PANEL
Anion gap: 4 — ABNORMAL LOW (ref 5–15)
BUN: 12 mg/dL (ref 6–20)
CALCIUM: 8.4 mg/dL — AB (ref 8.9–10.3)
CO2: 41 mmol/L — ABNORMAL HIGH (ref 22–32)
Chloride: 96 mmol/L — ABNORMAL LOW (ref 101–111)
Creatinine, Ser: 0.74 mg/dL (ref 0.44–1.00)
GFR calc Af Amer: >60 mL/min (ref >60)
Glucose, Bld: 110 mg/dL — ABNORMAL HIGH (ref 65–99)
POTASSIUM: 3.3 mmol/L — AB (ref 3.5–5.1)
Sodium: 141 mmol/L (ref 135–145)

## 2016-01-30 LAB — BRAIN NATRIURETIC PEPTIDE: B Natriuretic Peptide: 633 pg/mL — ABNORMAL HIGH (ref 0.0–100.0)

## 2016-01-30 MED ORDER — SACUBITRIL-VALSARTAN 49-51 MG PO TABS
1.0000 | ORAL_TABLET | Freq: Two times a day (BID) | ORAL | Status: DC
  Administered 2016-01-30 – 2016-02-02 (×6): 1 via ORAL
  Filled 2016-01-30 (×8): qty 1

## 2016-01-30 MED ORDER — POTASSIUM CHLORIDE CRYS ER 20 MEQ PO TBCR
40.0000 meq | EXTENDED_RELEASE_TABLET | Freq: Once | ORAL | Status: AC
  Administered 2016-01-30: 40 meq via ORAL
  Filled 2016-01-30: qty 2

## 2016-01-30 MED ORDER — CARVEDILOL 3.125 MG PO TABS
3.1250 mg | ORAL_TABLET | Freq: Two times a day (BID) | ORAL | Status: DC
  Administered 2016-01-30 – 2016-01-31 (×4): 3.125 mg via ORAL
  Filled 2016-01-30 (×4): qty 1

## 2016-01-30 MED ORDER — POTASSIUM CHLORIDE CRYS ER 20 MEQ PO TBCR
40.0000 meq | EXTENDED_RELEASE_TABLET | Freq: Every day | ORAL | Status: DC
  Administered 2016-01-30 – 2016-02-02 (×4): 40 meq via ORAL
  Filled 2016-01-30 (×4): qty 2

## 2016-01-30 MED ORDER — FUROSEMIDE 10 MG/ML IJ SOLN
40.0000 mg | Freq: Two times a day (BID) | INTRAMUSCULAR | Status: DC
Start: 2016-01-30 — End: 2016-02-01
  Administered 2016-01-30 – 2016-02-01 (×4): 40 mg via INTRAVENOUS
  Filled 2016-01-30 (×4): qty 4

## 2016-01-30 NOTE — Progress Notes (Signed)
Placed patient on bipap earlier for nighttime use.  30 mins later arrived to find bipap beeping and mask half off patient's face.  Patient stated she would not be able to sleep with mask on her face. o2 resumed at 2 liters

## 2016-01-30 NOTE — Progress Notes (Signed)
[place] at [place] NAME: Kathy Dominguez    MR#:  [id number]  DATE OF BIRTH:  1928-02-16  SUBJECTIVE:   Responding well to IV diuresis. Shortness of breath improved.  Did not tolerate bipap overnight.    REVIEW OF SYSTEMS:    Review of Systems  Constitutional: Negative for chills and fever.  HENT: Negative for congestion and tinnitus.   Eyes: Negative for blurred vision and double vision.  Respiratory: Positive for shortness of breath and wheezing. Negative for cough.   Cardiovascular: Negative for chest pain, orthopnea and PND.  Gastrointestinal: Negative for abdominal pain, diarrhea, nausea and vomiting.  Genitourinary: Negative for dysuria and hematuria.  Neurological: Negative for dizziness, sensory change and focal weakness.  All other systems reviewed and are negative.   Nutrition: Heart Healthy Tolerating Diet: Heart Healthy Tolerating PT: Eval noted.    DRUG ALLERGIES:  No Known Allergies  VITALS:  Blood pressure 117/61, pulse (!) 47, temperature 98.8 F (37.1 C), resp. rate 18, height 5\' 2"  (1.575 m), weight 58.4 kg (128 lb 12.8 oz), last menstrual period 02/21/1966, SpO2 90 %.  PHYSICAL EXAMINATION:   Physical Exam  GENERAL:  83 y.o.-year-old patient lying in the bed in NAD EYES: Pupils equal, round, reactive to light and accommodation. No scleral icterus. Extraocular muscles intact.  HEENT: Head atraumatic, normocephalic. Oropharynx and nasopharynx clear.  NECK:  Supple, no jugular venous distention. No thyroid enlargement, no tenderness.  LUNGS: Prolonged inspiratory and expiratory phase, minimal end expiratory wheezing bilaterally, bibasilar Rales. No use of accessory muscles of respiration.  CARDIOVASCULAR: S1, S2 normal. No murmurs, rubs, or gallops.  ABDOMEN: Soft, nontender, nondistended. Bowel sounds present. No organomegaly or mass.  EXTREMITIES: No cyanosis, clubbing or edema b/l.    NEUROLOGIC: Cranial  nerves II through XII are intact. No focal Motor or sensory deficits b/l.   PSYCHIATRIC: The patient is alert and oriented x 3.  SKIN: No obvious rash, lesion, or ulcer.    LABORATORY PANEL:   CBC  Recent Labs Lab 01/29/16 0404  WBC 3.6  HGB 14.4  HCT 44.7  PLT 171   ------------------------------------------------------------------------------------------------------------------  Chemistries   Recent Labs Lab 01/29/16 0404 01/30/16 0432  NA 141 141  K 3.7 3.3*  CL 104 96*  CO2 34* 41*  GLUCOSE 110* 110*  BUN 15 12  CREATININE 0.74 0.74  CALCIUM 8.9 8.4*  MG 1.9  --    ------------------------------------------------------------------------------------------------------------------  Cardiac Enzymes  Recent Labs Lab 01/29/16 1528  TROPONINI <0.03   ------------------------------------------------------------------------------------------------------------------  RADIOLOGY:  Dg Chest 1 View  Result Date: 01/29/2016 CLINICAL DATA:  Shortness of breath with exertion and peripheral edema which began about 10 days ago EXAM: CHEST 1 VIEW COMPARISON:  PA and lateral chest x-ray of January 28, 2016. FINDINGS: The lungs remain hyperinflated. The interstitial markings are more conspicuous bilaterally. Patchy confluent density in the inferior aspect of the right upper lobe may be developing. There is no pleural effusion. The cardiac silhouette is enlarged. The pulmonary vascularity is engorged. There is tortuosity of the descending thoracic aorta. The trachea is midline. There is calcification in the wall of the aortic arch. The bony thorax exhibits no acute abnormality. IMPRESSION: Pulmonary interstitial edema. Early alveolar edema versus pneumonia in the inferior aspect of the right upper lobe. Underlying COPD. Thoracic aortic atherosclerosis. Electronically Signed   By: David  Martinique M.D.   On: 01/29/2016 09:21   Dg Chest 2 View  Result Date: 01/28/2016 CLINICAL DATA:  Increasing swelling to the lower extremities with shortness of breath EXAM: CHEST  2 VIEW COMPARISON:  CT 01/25/2007 FINDINGS: Coarse interstitial pattern within the bilateral lung bases, likely represents scarring and chronic interstitial change. No acute consolidation or pleural effusion. There is thickening along the fissure on the lateral image. Moderate cardiomegaly with minimal central congestion. No overt pulmonary edema. No pneumothorax. IMPRESSION: 1. Cardiomegaly with mild central congestion but no overt pulmonary edema. 2. Probable scarring/fibrosis within the bilateral lung bases. Electronically Signed   By: Donavan Foil M.D.   On: 01/28/2016 18:07     ASSESSMENT AND PLAN:   83 year old female with past medical history of retention, hyperlipidemia, osteoporosis who presented to the hospital due to shortness of breath. Next  1. Acute respiratory failure with hypoxia and hypercapnia-secondary to underlying CHF and also underlying COPD. - improved with Bipap yesterday.  - cont. IV lasix and responding well to it.   - did not tolerate Bipap at bedtime last night.   2. Acute CHF - appreciate Cards input. Acute Systolic dysfunction.  -Continue aggressive diuresis with IV Lasix. By 3 L negative since admission. -Started on Entresto, low-dose Coreg as per cardiology Echo showing LV dysfunction. -As per cardiology plan for functional study as outpatient.  3. COPD - due to second hand smoke exposure.  - assess for Home O2 prior to discharge. No acute exacerbation.  - cont. PRN duonebs.   4. OA - cont. Meloxicam   All the records are reviewed and case discussed with Care Management/Social Worker. Management plans discussed with the patient, family and they are in agreement.  CODE STATUS: Full code  DVT Prophylaxis: Lovenox  TOTAL  TIME TAKING CARE OF THIS PATIENT: 30 minutes.   POSSIBLE D/C IN 1-2 DAYS, DEPENDING ON CLINICAL CONDITION.   Henreitta Leber M.D on 01/30/2016 at 4:37  PM  Between 7am to 6pm - Pager - [phone]  After 6pm go to [email or web address] - Proofreader  [facility]  Office  [phone]  CC: Primary care physician; Einar Pheasant, MD

## 2016-01-30 NOTE — Care Management Important Message (Signed)
Important Message  Patient Details  Name: Kathy Dominguez MRN: [id number] Date of Birth: 03-08-1928   Medicare Important Message Given:  Yes Initial signed IM printed from Epic and given to patient.   Katrina Stack, RN 01/30/2016, 5:52 PM

## 2016-01-30 NOTE — Progress Notes (Signed)
Patient Name: Kathy Dominguez Date of Encounter: 01/30/2016  Primary Cardiologist: Johnny Bridge, MD   Hospital Problem List     Principal Problem:   Acute systolic CHF (congestive heart failure) ([place]) Active Problems:   Acute respiratory failure with hypercapnia ([place])   Hypertension   Hypertensive heart disease with CHF (congestive heart failure) ([facility])   NSVT (nonsustained ventricular tachycardia) ([facility])   Hypercholesterolemia   Asthma   PAT (paroxysmal atrial tachycardia) (H[facility])   Hypokalemia     Subjective   Breathing improved though still dyspneic with talking.  No chest pain.  Inpatient Medications    . aspirin EC  81 mg Oral Daily  . carvedilol  3.125 mg Oral BID WC  . cholecalciferol  2,000 Units Oral Daily  . enoxaparin (LOVENOX) injection  40 mg Subcutaneous QHS  . furosemide  40 mg Intravenous BID  . nitroGLYCERIN  1 inch Topical Q6H  . potassium chloride  40 mEq Oral Daily  . sacubitril-valsartan  1 tablet Oral BID  . sodium chloride flush  3 mL Intravenous Q12H    Vital Signs    Vitals:   01/29/16 0833 01/29/16 1930 01/30/16 0523 01/30/16 0848  BP: (!) 158/80 119/61 127/67 (!) 142/82  Pulse: (!) 110 94 96 90  Resp: (!) 24 16 16  (!) 24  Temp: 98.3 F (36.8 C) 98.2 F (36.8 C) 98.2 F (36.8 C) 98 F (36.7 C)  TempSrc: Oral Oral  Oral  SpO2:  96% 99% 93%  Weight:   128 lb 12.8 oz (58.4 kg)   Height:        Intake/Output Summary (Last 24 hours) at 01/30/16 1052 Last data filed at 01/30/16 1038  Gross per 24 hour  Intake              603 ml  Output             2900 ml  Net            -2297 ml   Filed Weights   01/28/16 1715 01/29/16 0327 01/30/16 0523  Weight: 149 lb (67.6 kg) 136 lb 6.4 oz (61.9 kg) 128 lb 12.8 oz (58.4 kg)    Physical Exam   GEN: Frail, in no acute distress.  HEENT: Grossly normal.  Neck: Supple, mildly elevated neck veins - EJ easily visible.  No carotid bruits, or masses. Cardiac: RRR, no murmurs, rubs, or gallops. No  clubbing, cyanosis, edema.  Radials/DP/PT 2+ and equal bilaterally.  Respiratory:  Respirations regular and unlabored, bibasilar crackles. GI: Soft, nontender, nondistended, BS + x 4. MS: no deformity or atrophy. Skin: warm and dry, no rash. Neuro:  Strength and sensation are intact. Psych: AAOx3.  Normal affect.  Labs    CBC  Recent Labs  01/28/16 1728 01/29/16 0404  WBC 3.8 3.6  HGB 13.5 14.4  HCT 40.9 44.7  MCV 90.8 93.8  PLT 181 [id number]   Basic Metabolic Panel  Recent Labs  01/29/16 0404 01/30/16 0432  NA 141 141  K 3.7 3.3*  CL 104 96*  CO2 34* 41*  GLUCOSE 110* 110*  BUN 15 12  CREATININE 0.74 0.74  CALCIUM 8.9 8.4*  MG 1.9  --    Cardiac Enzymes  Recent Labs  01/29/16 0404 01/29/16 0943 01/29/16 1528  TROPONINI <0.03 <0.03 <0.03   Thyroid Function Tests  Recent Labs  01/29/16 0404  TSH 0.588    Telemetry    RSR with brief runs of PAT and a 5 beat  run of NSVT  Radiology    Dg Chest 1 View  Result Date: 01/29/2016 CLINICAL DATA:  Shortness of breath with exertion and peripheral edema which began about 10 days ago EXAM: CHEST 1 VIEW COMPARISON:  PA and lateral chest x-ray of January 28, 2016. FINDINGS: The lungs remain hyperinflated. The interstitial markings are more conspicuous bilaterally. Patchy confluent density in the inferior aspect of the right upper lobe may be developing. There is no pleural effusion. The cardiac silhouette is enlarged. The pulmonary vascularity is engorged. There is tortuosity of the descending thoracic aorta. The trachea is midline. There is calcification in the wall of the aortic arch. The bony thorax exhibits no acute abnormality. IMPRESSION: Pulmonary interstitial edema. Early alveolar edema versus pneumonia in the inferior aspect of the right upper lobe. Underlying COPD. Thoracic aortic atherosclerosis. Electronically Signed   By: David  Martinique M.D.   On: 01/29/2016 09:21   Dg Chest 2 View  Result Date:  01/28/2016 CLINICAL DATA:  Increasing swelling to the lower extremities with shortness of breath EXAM: CHEST  2 VIEW COMPARISON:  CT 01/25/2007 FINDINGS: Coarse interstitial pattern within the bilateral lung bases, likely represents scarring and chronic interstitial change. No acute consolidation or pleural effusion. There is thickening along the fissure on the lateral image. Moderate cardiomegaly with minimal central congestion. No overt pulmonary edema. No pneumothorax. IMPRESSION: 1. Cardiomegaly with mild central congestion but no overt pulmonary edema. 2. Probable scarring/fibrosis within the bilateral lung bases. Electronically Signed   By: Donavan Foil M.D.   On: 01/28/2016 18:07  _____________   2D Echocardiogram 1.4.2017  Study Conclusions   - Left ventricle: The cavity size was mildly dilated. Systolic   function was severely reduced. The estimated ejection fraction   was in the range of 25% to 30%. Severe diffuse hypokinesis.   Regional wall motion abnormalities cannot be excluded. Left   ventricular diastolic function parameters were normal. - Mitral valve: There was mild to moderate regurgitation. - Left atrium: The atrium was mildly dilated. - Right ventricle: Systolic function was normal. - Pulmonary arteries: Systolic pressure was moderate to severely   elevated. PA peak pressure: 61 mm Hg (S).   Patient Profile     83 year old female without prior cardiac history who was admitted on January 3 with a two-week history of progressive dyspnea and edema. Echo this admission shows LV dysfunction with an EF of 25-30% and severe diffuse hypokinesis. Troponins are normal.  Assessment & Plan     1. Acute systolic congestive heart failure: Patient presented January 3 with a two-week history of progressive dyspnea and edema. She was found to have pulmonary edema on chest x-ray and volume overload on exam. She has responded well to IV diuresis and is -2.4 L in the past 24 hours and down  8 pounds since admission. With diuresis, she has had symptomatic improvement though remains dyspneic while speaking and continues to have crackles on exam. Echo also showed significantly elevated PA pressures. Continue IV diuresis today though will back down to 40 mg twice a day. Her renal function is stable but bicarbonate is bumping some. In light of LV dysfunction, I will add low-dose carvedilol and switch from ARB therapy to [facility]. Will consider spironolactone next. She will require an ischemic evaluation and in light of normal troponins, absence of chest pain, and frailty, we would lean towards a noninvasive study  Lexiscan Myoview.  2. Hypertensive heart disease: Blood pressure has been relatively stable. I suspect she will tolerate  the switch to [facility] just fine.  3. Nonsustained ventricular tachycardia: She had up to 5 beats of nonsustained VT on telemetry. Adding low-dose beta blocker today.  4. Paroxysmal atrial tachycardia: She has had brief runs of atrial tachycardia on telemetry. Adding beta blocker today.  5. Hypokalemia: Supplement.  Signed, Murray Hodgkins NP 01/30/2016, 10:52 AM    Attending Note Patient seen and examined, agree with detailed note above,  Patient presentation and plan discussed on rounds.   Ms. Alegria appears more comfortable this morning though still not at her baseline Did not tolerate BiPAP last night, unable to sleep Is on nasal cannula oxygen this morning  Echocardiogram results reviewed by myself, discussed with the patient Global hypokinesis, depressed ejection fraction 25-30%, dilated LV and left atrium Moderate to severely elevated right heart pressures  On clinical exam, grossly elevated JVP though in a supine position, Lungs with scattered Rales bilaterally, heart sounds normal S1, S2, also S4, no murmurs appreciated, abdomen soft nontender, no significant lower extremity edema  Lab work reviewed showing potassium 3.3, normal kidney  function, ABG still with CO2 retention, 70 (checked yesterday 11 AM)  ----Acute systolic CHF Etiology unclear, no focal wall motion abnormality EF 25-30% with severely elevated right heart pressures Would recommend continued aggressive diuresis, respiratory support Add low-dose beta blocker, Coreg twice a day Consider changing ARB to entresto 49/51 mg BID Would likely benefit from at least a pharmacologic Myoview early next week to rule out ischemia  ----Arrhythmia Nonsustained VT 5 beats on telemetry, will add beta blocker History of paroxysmal atrial fibrillation, we'll continue to monitor  Greater than 50% was spent in counseling and coordination of care with patient Total encounter time 25 minutes or more   Signed: Esmond Plants  M.D., Ph.D. [facility]

## 2016-01-31 DIAGNOSIS — I110 Hypertensive heart disease with heart failure: Principal | ICD-10-CM

## 2016-01-31 LAB — BASIC METABOLIC PANEL
Anion gap: 8 (ref 5–15)
BUN: 14 mg/dL (ref 6–20)
CHLORIDE: 93 mmol/L — AB (ref 101–111)
CO2: 36 mmol/L — ABNORMAL HIGH (ref 22–32)
Calcium: 8.7 mg/dL — ABNORMAL LOW (ref 8.9–10.3)
Creatinine, Ser: 0.61 mg/dL (ref 0.44–1.00)
GFR calc Af Amer: >60 mL/min (ref >60)
GFR calc non Af Amer: >60 mL/min (ref >60)
GLUCOSE: 100 mg/dL — AB (ref 65–99)
POTASSIUM: 4.4 mmol/L (ref 3.5–5.1)
SODIUM: 137 mmol/L (ref 135–145)

## 2016-01-31 LAB — GLUCOSE, CAPILLARY: GLUCOSE-CAPILLARY: 120 mg/dL — AB (ref 65–99)

## 2016-01-31 MED ORDER — SPIRONOLACTONE 25 MG PO TABS
25.0000 mg | ORAL_TABLET | Freq: Every day | ORAL | Status: DC
  Administered 2016-01-31 – 2016-02-01 (×2): 25 mg via ORAL
  Filled 2016-01-31 (×2): qty 1

## 2016-01-31 MED ORDER — IPRATROPIUM-ALBUTEROL 0.5-2.5 (3) MG/3ML IN SOLN
3.0000 mL | Freq: Four times a day (QID) | RESPIRATORY_TRACT | Status: DC
  Administered 2016-01-31 – 2016-02-03 (×10): 3 mL via RESPIRATORY_TRACT
  Filled 2016-01-31 (×12): qty 3

## 2016-01-31 MED ORDER — DOCUSATE SODIUM 100 MG PO CAPS
100.0000 mg | ORAL_CAPSULE | Freq: Every day | ORAL | Status: DC
  Administered 2016-01-31 – 2016-02-03 (×4): 100 mg via ORAL
  Filled 2016-01-31 (×4): qty 1

## 2016-01-31 MED ORDER — GUAIFENESIN-DM 100-10 MG/5ML PO SYRP: 5.0000 mL | ORAL_SOLUTION | ORAL | Status: DC | PRN

## 2016-01-31 MED ORDER — BUDESONIDE 0.25 MG/2ML IN SUSP
0.2500 mg | Freq: Two times a day (BID) | RESPIRATORY_TRACT | Status: DC
  Administered 2016-01-31 – 2016-02-02 (×5): 0.25 mg via RESPIRATORY_TRACT
  Filled 2016-01-31 (×7): qty 2

## 2016-01-31 MED ORDER — METOPROLOL TARTRATE 25 MG PO TABS
25.0000 mg | ORAL_TABLET | Freq: Four times a day (QID) | ORAL | Status: DC
  Administered 2016-02-01 (×2): 25 mg via ORAL
  Filled 2016-01-31 (×3): qty 1

## 2016-01-31 MED ORDER — MAGNESIUM SULFATE 2 GM/50ML IV SOLN
2.0000 g | Freq: Once | INTRAVENOUS | Status: AC
  Administered 2016-01-31: 2 g via INTRAVENOUS
  Filled 2016-01-31: qty 50

## 2016-01-31 NOTE — Progress Notes (Signed)
[name], female   DOB: 1928/03/18, 83 y.o.   MRN: [id number]  [employer] PROGRESS NOTE  Afiya Everhart [id number] DOB: [birth year] DOA: 01/28/2016 PCP: Einar Pheasant, MD  HPI/Subjective: Patient with some shortness of breath and some cough. Feeling better than she did when she came into the hospital.  Objective: Vitals:   01/31/16 1128 01/31/16 1712  BP: 102/63 116/66  Pulse: 80 80  Resp: 14   Temp: 98.1 F (36.7 C)     Filed Weights   01/29/16 0327 01/30/16 0523 01/31/16 0505  Weight: 61.9 kg (136 lb 6.4 oz) 58.4 kg (128 lb 12.8 oz) 57.2 kg (126 lb 1.6 oz)    ROS: Review of Systems  Constitutional: Negative for chills and fever.  Eyes: Negative for blurred vision.  Respiratory: Positive for cough and shortness of breath.   Cardiovascular: Negative for chest pain.  Gastrointestinal: Negative for abdominal pain, constipation, diarrhea, nausea and vomiting.  Genitourinary: Negative for dysuria.  Musculoskeletal: Negative for joint pain.  Neurological: Negative for dizziness and headaches.   Exam: Physical Exam  Constitutional: She is oriented to person, place, and time.  HENT:  Nose: No mucosal edema.  Mouth/Throat: No oropharyngeal exudate or posterior oropharyngeal edema.  Eyes: Conjunctivae, EOM and lids are normal. Pupils are equal, round, and reactive to light.  Neck: No JVD present. Carotid bruit is not present. No edema present. No thyroid mass and no thyromegaly present.  Cardiovascular: S1 normal and S2 normal.  Exam reveals no gallop.   Murmur heard.  Systolic murmur is present with a grade of 2/6  Pulses:      Dorsalis pedis pulses are 2+ on the right side, and 2+ on the left side.  Respiratory: No respiratory distress. She has decreased breath sounds in the right middle field, the right lower field, the left middle field and the left lower field. She has wheezes in the right middle field and the left middle field. She has no rhonchi.  She has rales in the right lower field and the left lower field.  GI: Soft. Bowel sounds are normal. There is no tenderness.  Musculoskeletal:       Right ankle: She exhibits no swelling.       Left ankle: She exhibits no swelling.  Lymphadenopathy:    She has no cervical adenopathy.  Neurological: She is alert and oriented to person, place, and time. No cranial nerve deficit.  Skin: Skin is warm. No rash noted. Nails show no clubbing.  Psychiatric: She has a normal mood and affect.      Data Reviewed: Basic Metabolic Panel:  Recent Labs Lab 01/28/16 1728 01/29/16 0404 01/30/16 0432 01/31/16 0633  NA 139 141 141 137  K 3.8 3.7 3.3* 4.4  CL 101 104 96* 93*  CO2 33* 34* 41* 36*  GLUCOSE 106* 110* 110* 100*  BUN 18 15 12 14   CREATININE 0.77 0.74 0.74 0.61  CALCIUM 9.0 8.9 8.4* 8.7*  MG  --  1.9  --   --    CBC:  Recent Labs Lab 01/28/16 1728 01/29/16 0404  WBC 3.8 3.6  HGB 13.5 14.4  HCT 40.9 44.7  MCV 90.8 93.8  PLT 181 171   Cardiac Enzymes:  Recent Labs Lab 01/28/16 1728 01/29/16 0404 01/29/16 0943 01/29/16 1528  TROPONINI <0.03 <0.03 <0.03 <0.03   BNP (last 3 results)  Recent Labs  01/28/16 1728 01/30/16 0432  BNP 795.0* 633.0*   CBG:  Recent Labs Lab 01/31/16 0738  GLUCAP 120*    Scheduled Meds: . aspirin EC  81 mg Oral Daily  . budesonide (PULMICORT) nebulizer solution  0.25 mg Nebulization BID  . carvedilol  3.125 mg Oral BID WC  . cholecalciferol  2,000 Units Oral Daily  . docusate sodium  100 mg Oral Daily  . enoxaparin (LOVENOX) injection  40 mg Subcutaneous QHS  . furosemide  40 mg Intravenous BID  . ipratropium-albuterol  3 mL Nebulization Q6H  . nitroGLYCERIN  1 inch Topical Q6H  . potassium chloride  40 mEq Oral Daily  . sacubitril-valsartan  1 tablet Oral BID  . sodium chloride flush  3 mL Intravenous Q12H  . spironolactone  25 mg Oral Daily    Assessment/Plan:  1. Acute respiratory failure with hypoxia and  hypercapnia. Trying to taper off nasal cannula. With PCO2 of 88 on initial blood gas the patient would be a candidate for noninvasive ventilation at home. Will obtain an overnight oximetry. 2. Acute CHF exacerbation. Continue IV Lasix. Continue Coreg, entresto, and spironolactone 3. Asthma exacerbation versus COPD secondary to secondhand smoke. Start DuoNeb nebulizer solution and budesonide since the patient is wheezing today. 4. Weakness. Physical therapy evaluation  Code Status:     Code Status Orders        Start     Ordered   01/29/16 0320  Full code  Continuous     01/29/16 0319    Code Status History    Date Active Date Inactive Code Status Order ID Comments User Context   This patient has a current code status but no historical code status.     Family Communication: Family at bedside Disposition Plan: Home once moving better air  Consultants:  Cardiology  Time spent: 28 minutes  [place], [place]

## 2016-01-31 NOTE — Progress Notes (Signed)
Patient Name: Kathy Dominguez Date of Encounter: 01/31/2016  Primary Cardiologist: [facility] Problem List     Principal Problem:   Acute systolic CHF (congestive heart failure) ([place]) Active Problems:   Acute respiratory failure with hypercapnia ([facility])   Hypertension   Hypercholesterolemia   Asthma   Hypertensive heart disease with CHF (congestive heart failure) ([facility])   NSVT (nonsustained ventricular tachycardia) ([facility])   PAT (paroxysmal atrial tachycardia) (H[facility])   Hypokalemia     Subjective   No acute  Issues overnight. Breathing better. On nasal cannula at 2 L. Diuresed 1.7 L for the last 24 hours on IV Lasix 40 mg bid with 3.8 L negative for the admission. Weight down 10 pounds this admission and 2 pounds over the past 24 hours. Renal function stable. Potassium at goal. Sat up in recliner on 1/5 without issues.   Inpatient Medications    Scheduled Meds: . aspirin EC  81 mg Oral Daily  . carvedilol  3.125 mg Oral BID WC  . cholecalciferol  2,000 Units Oral Daily  . enoxaparin (LOVENOX) injection  40 mg Subcutaneous QHS  . furosemide  40 mg Intravenous BID  . nitroGLYCERIN  1 inch Topical Q6H  . potassium chloride  40 mEq Oral Daily  . sacubitril-valsartan  1 tablet Oral BID  . sodium chloride flush  3 mL Intravenous Q12H   Continuous Infusions:  PRN Meds: sodium chloride, acetaminophen, ondansetron (ZOFRAN) IV, sodium chloride flush   Vital Signs    Vitals:   01/30/16 1435 01/30/16 1950 01/31/16 0023 01/31/16 0505  BP:  102/70 (!) 103/57 (!) 107/58  Pulse:  79 81 91  Resp:  18    Temp: 98.8 F (37.1 C) 98.1 F (36.7 C)  97.5 F (36.4 C)  TempSrc:  Oral  Oral  SpO2:  95%  96%  Weight:    126 lb 1.6 oz (57.2 kg)  Height:        Intake/Output Summary (Last 24 hours) at 01/31/16 0738 Last data filed at 01/31/16 0505  Gross per 24 hour  Intake              363 ml  Output             1750 ml  Net            -1387 ml   Filed Weights   01/29/16  0327 01/30/16 0523 01/31/16 0505  Weight: 136 lb 6.4 oz (61.9 kg) 128 lb 12.8 oz (58.4 kg) 126 lb 1.6 oz (57.2 kg)    Physical Exam    GEN: Frail appearing, in no acute distress.  HEENT: Grossly normal.  Neck: Supple, JVD elevated ~ 6 cm, no carotid bruits, or masses. Cardiac: RRR, no murmurs, rubs, or gallops. No clubbing, cyanosis, edema.  Radials/DP/PT 2+ and equal bilaterally.  Respiratory:  Crackles along bilateral bases, improved from admission exam. GI: Soft, nontender, nondistended, BS + x 4. MS: no deformity or atrophy. Skin: warm and dry, no rash. Neuro:  Strength and sensation are intact. Psych: AAOx3.  Normal affect.  Labs    CBC  Recent Labs  01/28/16 1728 01/29/16 0404  WBC 3.8 3.6  HGB 13.5 14.4  HCT 40.9 44.7  MCV 90.8 93.8  PLT 181 [id number]   Basic Metabolic Panel  Recent Labs  01/29/16 0404 01/30/16 0432 01/31/16 0633  NA 141 141 137  K 3.7 3.3* 4.4  CL 104 96* 93*  CO2 34* 41* 36*  GLUCOSE 110*  110* 100*  BUN 15 12 14   CREATININE 0.74 0.74 0.61  CALCIUM 8.9 8.4* 8.7*  MG 1.9  --   --    Liver Function Tests No results for input(s): AST, ALT, ALKPHOS, BILITOT, PROT, ALBUMIN in the last 72 hours. No results for input(s): LIPASE, AMYLASE in the last 72 hours. Cardiac Enzymes  Recent Labs  01/29/16 0404 01/29/16 0943 01/29/16 1528  TROPONINI <0.03 <0.03 <0.03   BNP Invalid input(s): POCBNP D-Dimer No results for input(s): DDIMER in the last 72 hours. Hemoglobin A1C No results for input(s): HGBA1C in the last 72 hours. Fasting Lipid Panel No results for input(s): CHOL, HDL, LDLCALC, TRIG, CHOLHDL, LDLDIRECT in the last 72 hours. Thyroid Function Tests  Recent Labs  01/29/16 0404  TSH 0.588    Telemetry    NSR, 80-90 bpm, 5 beats NSVT, occasional PVCs - Personally Reviewed  ECG    n/a - Personally Reviewed  Radiology    Dg Chest 1 View  Result Date: 01/29/2016 CLINICAL DATA:  Shortness of breath with exertion and  peripheral edema which began about 10 days ago EXAM: CHEST 1 VIEW COMPARISON:  PA and lateral chest x-ray of January 28, 2016. FINDINGS: The lungs remain hyperinflated. The interstitial markings are more conspicuous bilaterally. Patchy confluent density in the inferior aspect of the right upper lobe may be developing. There is no pleural effusion. The cardiac silhouette is enlarged. The pulmonary vascularity is engorged. There is tortuosity of the descending thoracic aorta. The trachea is midline. There is calcification in the wall of the aortic arch. The bony thorax exhibits no acute abnormality. IMPRESSION: Pulmonary interstitial edema. Early alveolar edema versus pneumonia in the inferior aspect of the right upper lobe. Underlying COPD. Thoracic aortic atherosclerosis. Electronically Signed   By: David  Martinique M.D.   On: 01/29/2016 09:21    Cardiac Studies   2D Echocardiogram 1.4.2017  Study Conclusions  - Left ventricle: The cavity size was mildly dilated. Systolic function was severely reduced. The estimated ejection fraction was in the range of 25% to 30%. Severe diffuse hypokinesis. Regional wall motion abnormalities cannot be excluded. Left ventricular diastolic function parameters were normal. - Mitral valve: There was mild to moderate regurgitation. - Left atrium: The atrium was mildly dilated. - Right ventricle: Systolic function was normal. - Pulmonary arteries: Systolic pressure was moderate to severely elevated. PA peak pressure: 61 mm Hg (S).  Patient Profile     83 y.o. female without prior cardiac history who was admitted on January 3 with a two-week history of progressive dyspnea and edema. Echo this admission shows LV dysfunction with an EF of 25-30% and severe diffuse hypokinesis. Troponins are normal.  Assessment & Plan    1. Acute systolic CHF: -Breathing improved -Has diuresed 3.8 L for the admission and 1.7 L for the past 24 hours -Continues to have  some crackles along the bases bilaterally with some dyspnea with speaking and ambulation  -BNP improving -Continue IV Lasix 40 mg bid today with KCl repletion  -Wean oxygen as able -Continue Coreg, Entresto -Discontinue nitropaste and add Imdur in place -Plan for nuclear stress possibly first of the week to evaluate for high-risk ischemia (negative troponins and chest pain free) -Consider spironolactone prior to discharge vs outpatient follow up -Will need to discuss possible ICD implantation at hospital follow after waiting period with optimal medical management up if EF does not improve  2. Hypertensive heart disease: -BP stable -Change nitropaste to Imdur as above  3. NSVT: -  Stable -5 beats of nonsustained VT this morning -Coreg as above -No indication for amiodarone at this time  4. PAT: -None seen in last 24 hours  5. Hypokalemia: -Resolved s/p repletion   Signed, Christell Faith, PA-C [facility] Pager: [phone] 01/31/2016, 7:38 AM

## 2016-01-31 NOTE — Progress Notes (Addendum)
12 beat run of Vtach.  Strip saved. Patient assymptomatic. Dr. Leslye Peer notified.

## 2016-01-31 NOTE — Evaluation (Signed)
Physical Therapy Evaluation Patient Details Name: Kathy Dominguez MRN: [id number] DOB: [birth year] Today's Date: 01/31/2016   History of Present Illness  Pt is a 83 y/o F who presented with progressively worsening BLE swelling and SOB.  Admitted with acute systolic CHF.  Pt's PMH includes osteoporosis.    Clinical Impression  Pt admitted with above diagnosis. Pt currently with functional limitations due to the deficits listed below (see PT Problem List). Kathy Dominguez is Ind as baseline and very active.  She currently requires min guard assist using RW to ambulate 90 ft.  SpO2 quickly down to 89% on RA but remains at or above 91% on 1L O2 while ambulating. Her son will be staying with her at d/c for as long as she needs. She will need 24/7 supervision and will benefit from [place]. Pt will benefit from skilled PT to increase their independence and safety with mobility to allow discharge to the venue listed below.      Follow Up Recommendations Home health PT;Supervision/Assistance - 24 hour    Equipment Recommendations  Rolling walker with 5" wheels    Recommendations for Other Services       Precautions / Restrictions Precautions Precautions: Fall;Other (comment) Precaution Comments: monitor O2 Restrictions Weight Bearing Restrictions: No      Mobility  Bed Mobility Overal bed mobility: Modified Independent             General bed mobility comments: Increased time and efffort with HOB elevated  Transfers Overall transfer level: Needs assistance Equipment used: None Transfers: Sit to/from Stand Sit to Stand: Min guard         General transfer comment: Pt initially unsteady but no LOB and no need for outside assist.    Ambulation/Gait Ambulation/Gait assistance: Min guard Ambulation Distance (Feet): 90 Feet Assistive device: Rolling walker (2 wheeled) Gait Pattern/deviations: Step-through pattern;Decreased stride length;Trunk flexed Gait velocity: decreased Gait  velocity interpretation: Below normal speed for age/gender General Gait Details: Cues for pursed lip breathing which pt has difficulty performing.  SpO2 quickly down to 89% on RA but remains at or above 91% on 1L O2 while ambulating.  Pt initially not using AD in rom and pt unsteady reaching out for countertop to steady.  RW introduced and pt able to remains steady.  Stairs            Wheelchair Mobility    Modified Rankin (Stroke Patients Only)       Balance Overall balance assessment: Needs assistance Sitting-balance support: No upper extremity supported;Feet supported Sitting balance-Leahy Scale: Good     Standing balance support: No upper extremity supported;During functional activity Standing balance-Leahy Scale: Fair                               Pertinent Vitals/Pain Pain Assessment: No/denies pain    Home Living Family/patient expects to be discharged to:: Private residence Living Arrangements: Alone Available Help at Discharge: Family;Available 24 hours/day Type of Home: House Home Access: Stairs to enter   [employer] of Steps: 2 Home Layout: One level Home Equipment: None Additional Comments: Son will be staying with pt at d/c for as long as needed    Prior Function Level of Independence: Independent         Comments: Pt denies any falls in the past 6 months.  Was Ind without AD and is caregiver for her 2 very young <72 year old great grandchildren during the day while  their mother is at work.  Still driving.     Hand Dominance   Dominant Hand: Right    Extremity/Trunk Assessment   Upper Extremity Assessment Upper Extremity Assessment:  (Strength grossly 4/5 RUE and 4-/5 LUE)    Lower Extremity Assessment Lower Extremity Assessment: Overall WFL for tasks assessed    Cervical / Trunk Assessment Cervical / Trunk Assessment: Kyphotic  Communication   Communication: No difficulties  Cognition Arousal/Alertness:  Awake/alert Behavior During Therapy: WFL for tasks assessed/performed Overall Cognitive Status: Within Functional Limits for tasks assessed                      General Comments      Exercises General Exercises - Upper Extremity Shoulder Flexion: AROM;Both;10 reps;Seated General Exercises - Lower Extremity Ankle Circles/Pumps: AROM;Both;10 reps;Seated Long Arc Quad: Strengthening;Both;10 reps;Seated Hip Flexion/Marching: Strengthening;Both;10 reps;Seated   Assessment/Plan    PT Assessment Patient needs continued PT services  PT Problem List Decreased strength;Decreased balance;Decreased knowledge of use of DME;Decreased safety awareness;Cardiopulmonary status limiting activity          PT Treatment Interventions DME instruction;Gait training;Stair training;Therapeutic activities;Therapeutic exercise;Balance training;Neuromuscular re-education;Patient/family education    PT Goals (Current goals can be found in the Care Plan section)  Acute Rehab PT Goals Patient Stated Goal: to go home PT Goal Formulation: With patient Time For Goal Achievement: 02/14/16 Potential to Achieve Goals: Good    Frequency Min 2X/week   Barriers to discharge        Co-evaluation               End of Session Equipment Utilized During Treatment: Gait belt;Oxygen Activity Tolerance: Patient tolerated treatment well Patient left: in chair;with call bell/phone within reach;with chair alarm set Nurse Communication: Mobility status;Other (comment) (SpO2)         Time: [id number] PT Time Calculation (min) (ACUTE ONLY): 25 min   Charges:   PT Evaluation $PT Eval Low Complexity: 1 Procedure PT Treatments $Gait Training: 8-22 mins   PT G Codes:        Collie Siad PT, DPT 01/31/2016, 12:47 PM

## 2016-01-31 NOTE — Progress Notes (Signed)
At 1745. Patient had a 17 beat run of Wide QRS complex.  Dr. Leslye Peer notified, recommended I call Cardiology.  No response from Cardiology.  Dr. Doy Hutching called.  He is changing her medications.

## 2016-02-01 DIAGNOSIS — E7800 Pure hypercholesterolemia, unspecified: Secondary | ICD-10-CM

## 2016-02-01 LAB — BASIC METABOLIC PANEL
Anion gap: 5 (ref 5–15)
BUN: 18 mg/dL (ref 6–20)
CHLORIDE: 91 mmol/L — AB (ref 101–111)
CO2: 39 mmol/L — ABNORMAL HIGH (ref 22–32)
CREATININE: 0.8 mg/dL (ref 0.44–1.00)
Calcium: 8.7 mg/dL — ABNORMAL LOW (ref 8.9–10.3)
GFR calc Af Amer: >60 mL/min (ref >60)
GLUCOSE: 110 mg/dL — AB (ref 65–99)
POTASSIUM: 4.5 mmol/L (ref 3.5–5.1)
SODIUM: 135 mmol/L (ref 135–145)

## 2016-02-01 LAB — MAGNESIUM: MAGNESIUM: 2.1 mg/dL (ref 1.7–2.4)

## 2016-02-01 MED ORDER — METOPROLOL TARTRATE 25 MG PO TABS: 25.0000 mg | ORAL_TABLET | Freq: Two times a day (BID) | ORAL | Status: DC

## 2016-02-01 MED ORDER — METOPROLOL TARTRATE 25 MG PO TABS
12.5000 mg | ORAL_TABLET | Freq: Two times a day (BID) | ORAL | Status: DC
  Administered 2016-02-01: 12.5 mg via ORAL
  Filled 2016-02-01 (×2): qty 1

## 2016-02-01 MED ORDER — CARVEDILOL 6.25 MG PO TABS
6.2500 mg | ORAL_TABLET | Freq: Two times a day (BID) | ORAL | Status: DC
  Administered 2016-02-01: 6.25 mg via ORAL
  Filled 2016-02-01: qty 1

## 2016-02-01 MED ORDER — SPIRONOLACTONE 25 MG PO TABS
12.5000 mg | ORAL_TABLET | Freq: Every day | ORAL | Status: DC
  Administered 2016-02-02 – 2016-02-03 (×2): 12.5 mg via ORAL
  Filled 2016-02-01 (×2): qty 1

## 2016-02-01 MED ORDER — POLYETHYLENE GLYCOL 3350 17 G PO PACK
17.0000 g | PACK | Freq: Every day | ORAL | Status: DC
  Administered 2016-02-03: 17 g via ORAL
  Filled 2016-02-01 (×2): qty 1

## 2016-02-01 MED ORDER — FUROSEMIDE 40 MG PO TABS
40.0000 mg | ORAL_TABLET | Freq: Two times a day (BID) | ORAL | Status: DC
  Administered 2016-02-01 – 2016-02-02 (×2): 40 mg via ORAL
  Filled 2016-02-01 (×4): qty 1

## 2016-02-01 NOTE — Progress Notes (Signed)
Overnight pulse ox initiated on this patient. Patient on 1 liter o2 saturation 97 hr 77 rr 17. No distress noted. Has order for bipap at bedtime. Patient has declined previous nights stating she could not sleep with that on therefore proceeded with overnight test on 1 liter.  Order does not indicate what mode of therapy patient is to be on during testing. She has declined bipap for the night as well.

## 2016-02-01 NOTE — Progress Notes (Signed)
Consent signed and in chart.

## 2016-02-01 NOTE — Progress Notes (Signed)
[name], female   DOB: 12-Dec-1928, 83 y.o.   MRN: [id number]  [facility] PROGRESS NOTE  Kathy Dominguez [id number] DOB: 08-Oct-1928 DOA: 01/28/2016 PCP: Einar Pheasant, MD  HPI/Subjective: Patient breathing a little bit better today. Occassional rattling in chest. Some coughing.  Objective: Vitals:   02/01/16 0855 02/01/16 1118  BP: (!) 115/56 (!) 83/53  Pulse:  74  Resp:  20  Temp:  98.3 F (36.8 C)    Filed Weights   01/30/16 0523 01/31/16 0505 02/01/16 0351  Weight: 58.4 kg (128 lb 12.8 oz) 57.2 kg (126 lb 1.6 oz) 58.5 kg (128 lb 14.4 oz)    ROS: Review of Systems  Constitutional: Negative for chills and fever.  Eyes: Negative for blurred vision.  Respiratory: Positive for cough and shortness of breath.   Cardiovascular: Negative for chest pain.  Gastrointestinal: Positive for constipation. Negative for abdominal pain, diarrhea, nausea and vomiting.  Genitourinary: Negative for dysuria.  Musculoskeletal: Negative for joint pain.  Neurological: Negative for dizziness and headaches.   Exam: Physical Exam  Constitutional: She is oriented to person, place, and time.  HENT:  Nose: No mucosal edema.  Mouth/Throat: No oropharyngeal exudate or posterior oropharyngeal edema.  Eyes: Conjunctivae, EOM and lids are normal. Pupils are equal, round, and reactive to light.  Neck: No JVD present. Carotid bruit is not present. No edema present. No thyroid mass and no thyromegaly present.  Cardiovascular: S1 normal and S2 normal.  Exam reveals no gallop.   Murmur heard.  Systolic murmur is present with a grade of 2/6  Pulses:      Dorsalis pedis pulses are 2+ on the right side, and 2+ on the left side.  Respiratory: No respiratory distress. She has decreased breath sounds in the right lower field and the left lower field. She has no wheezes. She has no rhonchi. She has no rales.  GI: Soft. Bowel sounds are normal. There is no tenderness.  Musculoskeletal:       Right ankle: She exhibits no swelling.       Left ankle: She exhibits no swelling.  Lymphadenopathy:    She has no cervical adenopathy.  Neurological: She is alert and oriented to person, place, and time. No cranial nerve deficit.  Skin: Skin is warm. No rash noted. Nails show no clubbing.  Psychiatric: She has a normal mood and affect.      Data Reviewed: Basic Metabolic Panel:  Recent Labs Lab 01/28/16 1728 01/29/16 0404 01/30/16 0432 01/31/16 0633 02/01/16 0427  NA 139 141 141 137 135  K 3.8 3.7 3.3* 4.4 4.5  CL 101 104 96* 93* 91*  CO2 33* 34* 41* 36* 39*  GLUCOSE 106* 110* 110* 100* 110*  BUN 18 15 12 14 18   CREATININE 0.77 0.74 0.74 0.61 0.80  CALCIUM 9.0 8.9 8.4* 8.7* 8.7*  MG  --  1.9  --   --  2.1   CBC:  Recent Labs Lab 01/28/16 1728 01/29/16 0404  WBC 3.8 3.6  HGB 13.5 14.4  HCT 40.9 44.7  MCV 90.8 93.8  PLT 181 171   Cardiac Enzymes:  Recent Labs Lab 01/28/16 1728 01/29/16 0404 01/29/16 0943 01/29/16 1528  TROPONINI <0.03 <0.03 <0.03 <0.03   BNP (last 3 results)  Recent Labs  01/28/16 1728 01/30/16 0432  BNP 795.0* 633.0*   CBG:  Recent Labs Lab 01/31/16 0738  GLUCAP 120*    Scheduled Meds: . aspirin EC  81 mg Oral Daily  . budesonide (  PULMICORT) nebulizer solution  0.25 mg Nebulization BID  . cholecalciferol  2,000 Units Oral Daily  . docusate sodium  100 mg Oral Daily  . enoxaparin (LOVENOX) injection  40 mg Subcutaneous QHS  . furosemide  40 mg Oral BID  . ipratropium-albuterol  3 mL Nebulization Q6H  . metoprolol tartrate  12.5 mg Oral BID  . potassium chloride  40 mEq Oral Daily  . sacubitril-valsartan  1 tablet Oral BID  . sodium chloride flush  3 mL Intravenous Q12H  . [START ON 02/02/2016] spironolactone  12.5 mg Oral Daily    Assessment/Plan:  1. Acute respiratory failure with hypoxia and hypercapnia. Trying to taper off nasal cannula. With PCO2 of 88 on initial blood gas the patient would be a candidate for  noninvasive ventilation at home.  Overnight oximetry showed hypoxia 100% of the time with a large amount of time spent in the 50s with her saturations. BiPAP at night while here. Will see if she qualifies for oxygen during the day also. 2. Acute systolic CHF exacerbation. Switched to by mouth Lasix. Continue Coreg, entresto, and spironolactone. Discontinue nitro paste with hypotension. With ejection fraction 25-30% would be a candidate for a LifeVest 3. Asthma exacerbation versus COPD secondary to secondhand smoke. Started DuoNeb nebulizer solution and budesonide. We will need inhalers prior to going home. 4. Weakness. Physical therapy evaluation recommended home with home health  Code Status:     Code Status Orders        Start     Ordered   01/29/16 0320  Full code  Continuous     01/29/16 0319    Code Status History    Date Active Date Inactive Code Status Order ID Comments User Context   This patient has a current code status but no historical code status.     Family Communication:Daughter at bedside Disposition Plan: Home with home health potentially Monday evening versus Tuesday depending on clinical course  Consultants:  Cardiology  Time spent: 28 minutes  [place], [place]

## 2016-02-01 NOTE — Evaluation (Signed)
Occupational Therapy Evaluation Patient Details Name: Kathy Dominguez MRN: [id number] DOB: Apr 23, 1928 Today's Date: 02/01/2016    History of Present Illness     Clinical Impression   Patient is an 83 year old female admitted for exacerbation of CHF. Patient would benefit from OT services for education on energy conservation techniques, and use of DME/AE for self care tasks.    Follow Up Recommendations  Home health OT    Equipment Recommendations  Tub/shower seat    Recommendations for Other Services       Precautions / Restrictions Precautions Precautions: Fall;Other (comment) Precaution Comments: monitor O2 Restrictions Weight Bearing Restrictions: No      Mobility Bed Mobility                  Transfers Overall transfer level: Needs assistance Equipment used: None Transfers: Sit to/from Stand Sit to Stand: Min guard              Balance                                            ADL Overall ADL's : Needs assistance/impaired                     Lower Body Dressing: Minimal assistance   Toilet Transfer: Minimal assistance;Min guard                   Vision     Perception     Praxis      Pertinent Vitals/Pain Pain Assessment: No/denies pain     Hand Dominance Right   Extremity/Trunk Assessment Upper Extremity Assessment Upper Extremity Assessment: Overall WFL for tasks assessed   Lower Extremity Assessment Lower Extremity Assessment: Overall WFL for tasks assessed   Cervical / Trunk Assessment Cervical / Trunk Assessment: Kyphotic   Communication Communication Communication: No difficulties   Cognition Arousal/Alertness: Awake/alert Behavior During Therapy: WFL for tasks assessed/performed Overall Cognitive Status: Within Functional Limits for tasks assessed                     General Comments       Exercises       Shoulder Instructions      Home Living Family/patient  expects to be discharged to:: Private residence Living Arrangements: Alone Available Help at Discharge: Family;Available 24 hours/day Type of Home: House Home Access: Stairs to enter [employer] of Steps: 2   Home Layout: One level     Bathroom Shower/Tub: Corporate investment banker: Standard Bathroom Accessibility: Yes   Home Equipment: None   Additional Comments: Son will be staying with pt at d/c for as long as needed      Prior Functioning/Environment Level of Independence: Independent        Comments: Pt denies any falls in the past 6 months.  Was Ind without AD and is caregiver for her 2 very young <39 year old great grandchildren during the day while their mother is at work.  Still driving.        OT Problem List: Decreased activity tolerance;Cardiopulmonary status limiting activity;Decreased knowledge of use of DME or AE   OT Treatment/Interventions: Self-care/ADL training;Energy conservation;DME and/or AE instruction    OT Goals(Current goals can be found in the care plan section) Acute Rehab OT Goals Patient Stated Goal: to go home OT Goal  Formulation: With patient Potential to Achieve Goals: Good  OT Frequency: Min 1X/week   Barriers to D/C:            Co-evaluation              End of Session    Activity Tolerance: Patient tolerated treatment well Patient left: in chair;with call bell/phone within reach;with chair alarm set;with nursing/sitter in room   Time: 1230-1245 OT Time Calculation (min): 15 min Charges:  OT General Charges $OT Visit: 1 Procedure OT Evaluation $OT Eval Low Complexity: 1 Procedure G-Codes:    Kathy Dominguez March 01, 2016, 8:07 PM  Kathy Dominguez, OTR/Dominguez

## 2016-02-01 NOTE — Progress Notes (Signed)
Patient Name: Kathy Dominguez Date of Encounter: 02/01/2016  Primary Cardiologist: [facility] Problem List     Principal Problem:   Acute systolic CHF (congestive heart failure) ([place]) Active Problems:   Acute respiratory failure with hypercapnia (HCC)   Hypertension   Hypercholesterolemia   Asthma   Hypertensive heart disease with CHF (congestive heart failure) (HCC)   NSVT (nonsustained ventricular tachycardia) (HCC)   PAT (paroxysmal atrial tachycardia) (HCC)   Hypokalemia     Subjective   Feels like she is breathing better this morning. Remains on 1 L oxygen via nasal cannula. Had 12 beats of WCT at 10:31 on 1/6 and 16 beats WCT at 17:43. She was asymptomatic. Metoprolol was changed to 25 mg q 6 hours. UOP of 4.7 L for the admission and 1.3 L for the past 24 hours. Labs stable.   Inpatient Medications    Scheduled Meds: . aspirin EC  81 mg Oral Daily  . budesonide (PULMICORT) nebulizer solution  0.25 mg Nebulization BID  . cholecalciferol  2,000 Units Oral Daily  . docusate sodium  100 mg Oral Daily  . enoxaparin (LOVENOX) injection  40 mg Subcutaneous QHS  . furosemide  40 mg Intravenous BID  . ipratropium-albuterol  3 mL Nebulization Q6H  . metoprolol tartrate  25 mg Oral Q6H  . nitroGLYCERIN  1 inch Topical Q6H  . potassium chloride  40 mEq Oral Daily  . sacubitril-valsartan  1 tablet Oral BID  . sodium chloride flush  3 mL Intravenous Q12H  . spironolactone  25 mg Oral Daily   Continuous Infusions:  PRN Meds: sodium chloride, acetaminophen, guaiFENesin-dextromethorphan, ondansetron (ZOFRAN) IV, sodium chloride flush   Vital Signs    Vitals:   02/01/16 0014 02/01/16 0306 02/01/16 0351 02/01/16 0524  BP: 127/72 (!) 116/55 (!) 107/57   Pulse: 73 74 65 69  Resp:  18 18   Temp:  98.2 F (36.8 C) 98 F (36.7 C)   TempSrc:  Oral Oral   SpO2:  92% 96%   Weight:   128 lb 14.4 oz (58.5 kg)   Height:        Intake/Output Summary (Last 24 hours) at  02/01/16 0831 Last data filed at 02/01/16 0530  Gross per 24 hour  Intake              360 ml  Output             1300 ml  Net             -940 ml   Filed Weights   01/30/16 0523 01/31/16 0505 02/01/16 0351  Weight: 128 lb 12.8 oz (58.4 kg) 126 lb 1.6 oz (57.2 kg) 128 lb 14.4 oz (58.5 kg)    Physical Exam    GEN: Frail appearing, in no acute distress.  HEENT: Grossly normal.  Neck: Supple, JVD mildlyelevated, carotid bruits, or masses. Cardiac: RRR, no murmurs, rubs, or gallops. No clubbing, cyanosis, edema.  Radials/DP/PT 2+ and equal bilaterally.  Respiratory:  Improving breath sounds bilaterally with continued crackles along the left base. On 1 L oxygen via nasal cannula. GI: Soft, nontender, nondistended, BS + x 4. MS: no deformity or atrophy. Skin: warm and dry, no rash. Neuro:  Strength and sensation are intact. Psych: AAOx3.  Normal affect.  Labs    CBC No results for input(s): WBC, NEUTROABS, HGB, HCT, MCV, PLT in the last 72 hours. Basic Metabolic Panel  Recent Labs  01/31/16 [phone] 02/01/16 0427  NA 137 135  K 4.4 4.5  CL 93* 91*  CO2 36* 39*  GLUCOSE 100* 110*  BUN 14 18  CREATININE 0.61 0.80  CALCIUM 8.7* 8.7*   Liver Function Tests No results for input(s): AST, ALT, ALKPHOS, BILITOT, PROT, ALBUMIN in the last 72 hours. No results for input(s): LIPASE, AMYLASE in the last 72 hours. Cardiac Enzymes  Recent Labs  01/29/16 0943 01/29/16 1528  TROPONINI <0.03 <0.03   BNP Invalid input(s): POCBNP D-Dimer No results for input(s): DDIMER in the last 72 hours. Hemoglobin A1C No results for input(s): HGBA1C in the last 72 hours. Fasting Lipid Panel No results for input(s): CHOL, HDL, LDLCALC, TRIG, CHOLHDL, LDLDIRECT in the last 72 hours. Thyroid Function Tests No results for input(s): TSH, T4TOTAL, T3FREE, THYROIDAB in the last 72 hours.  Invalid input(s): FREET3  Telemetry    Currently in sinus rhythm, 70's. 12 beats of WCT at 10:31 on 1/6 and  16 beats of WCT at 17:43 (asymptomatic) - Personally Reviewed  ECG    n/a - Personally Reviewed  Radiology    No results found.  Cardiac Studies   2D Echocardiogram1.4.2017  Study Conclusions  - Left ventricle: The cavity size was mildly dilated. Systolic function was severely reduced. The estimated ejection fraction was in the range of 25% to 30%. Severe diffuse hypokinesis. Regional wall motion abnormalities cannot be excluded. Left ventricular diastolic function parameters were normal. - Mitral valve: There was mild to moderate regurgitation. - Left atrium: The atrium was mildly dilated. - Right ventricle: Systolic function was normal. - Pulmonary arteries: Systolic pressure was moderate to severely elevated. PA peak pressure: 61 mm Hg (S).  Patient Profile     83 y.o. female without prior cardiac history who was admitted on January 3 with a two-week history of progressive dyspnea and edema. Echo this admission shows LV dysfunction with an EF of 25-30% and severe diffuse hypokinesis. Troponins are normal.  Assessment & Plan    1. Acute systolic CHF: -Breathing improved -Has diuresed 4.7 L for the admission and 1.3 L for the past 24 hours -Continues to have some crackles along the left base  -BNP improving -Continue IV Lasix 40 mg bid today with KCl repletion, possible transition to PO Lasix on 1/8  -Wean oxygen as able -Continue Coreg, Entresto, spironolactone  -Will place order for nuclear stress test on 1/8 to evaluate for high-risk ischemia (negative troponins and chest pain free) -Will need to discuss possible ICD implantation at hospital follow after waiting period with optimal medical management up if EF does not improve  2. Hypertensive heart disease: -BP stable  3. NSVT: -Stable, asymptomatic -As above -Change Lopressor back to Coreg given the patient's cardiomyopathy -Check magnesium -No indication for amiodarone at this time  4.  PAT: -None seen in last 24 hours  5. Hypokalemia: -Resolved s/p repletion    Signed, Christell Faith, PA-C [facility] Pager: [phone] 02/01/2016, 8:31 AM

## 2016-02-02 ENCOUNTER — Inpatient Hospital Stay (HOSPITAL_BASED_OUTPATIENT_CLINIC_OR_DEPARTMENT_OTHER): Payer: Medicare HMO

## 2016-02-02 ENCOUNTER — Encounter: Payer: Self-pay | Admitting: *Deleted

## 2016-02-02 ENCOUNTER — Inpatient Hospital Stay: Payer: Medicare HMO

## 2016-02-02 DIAGNOSIS — R0602 Shortness of breath: Secondary | ICD-10-CM

## 2016-02-02 DIAGNOSIS — I429 Cardiomyopathy, unspecified: Secondary | ICD-10-CM | POA: Diagnosis not present

## 2016-02-02 LAB — NM MYOCAR MULTI W/SPECT W/WALL MOTION / EF
CSEPEDS: 0 s
Estimated workload: 1 METS
Exercise duration (min): 0 min
LVDIAVOL: 158 mL (ref 46–106)
LVSYSVOL: 105 mL
MPHR: 135 {beats}/min
Peak HR: 102 {beats}/min
Percent HR: 75 %
Rest HR: 80 {beats}/min
SDS: 0
SRS: 12
SSS: 12
TID: 0.84

## 2016-02-02 LAB — BASIC METABOLIC PANEL
Anion gap: 7 (ref 5–15)
BUN: 24 mg/dL — AB (ref 6–20)
CHLORIDE: 91 mmol/L — AB (ref 101–111)
CO2: 38 mmol/L — AB (ref 22–32)
Calcium: 8.8 mg/dL — ABNORMAL LOW (ref 8.9–10.3)
Creatinine, Ser: 0.87 mg/dL (ref 0.44–1.00)
GFR calc Af Amer: >60 mL/min (ref >60)
GFR calc non Af Amer: 59 mL/min — ABNORMAL LOW (ref >60)
Glucose, Bld: 103 mg/dL — ABNORMAL HIGH (ref 65–99)
Potassium: 4.8 mmol/L (ref 3.5–5.1)
Sodium: 136 mmol/L (ref 135–145)

## 2016-02-02 MED ORDER — REGADENOSON 0.4 MG/5ML IV SOLN
0.4000 mg | Freq: Once | INTRAVENOUS | Status: AC
  Administered 2016-02-02: 0.4 mg via INTRAVENOUS
  Filled 2016-02-02: qty 5

## 2016-02-02 MED ORDER — SACUBITRIL-VALSARTAN 24-26 MG PO TABS
1.0000 | ORAL_TABLET | Freq: Two times a day (BID) | ORAL | Status: DC
  Administered 2016-02-03: 1 via ORAL
  Filled 2016-02-02: qty 1

## 2016-02-02 MED ORDER — TECHNETIUM TC 99M TETROFOSMIN IV KIT
29.9800 | PACK | Freq: Once | INTRAVENOUS | Status: AC | PRN
  Administered 2016-02-02: 29.98 via INTRAVENOUS

## 2016-02-02 MED ORDER — TECHNETIUM TC 99M TETROFOSMIN IV KIT
12.6700 | PACK | Freq: Once | INTRAVENOUS | Status: AC | PRN
  Administered 2016-02-02: 12.67 via INTRAVENOUS

## 2016-02-02 MED ORDER — AZITHROMYCIN 500 MG PO TABS
500.0000 mg | ORAL_TABLET | Freq: Every day | ORAL | Status: DC
  Administered 2016-02-02 – 2016-02-03 (×2): 500 mg via ORAL
  Filled 2016-02-02 (×2): qty 1

## 2016-02-02 NOTE — Progress Notes (Signed)
Patient Name: Kathy Dominguez Date of Encounter: 02/02/2016  Primary Cardiologist: Johnny Bridge, MD   Hospital Problem List     Principal Problem:   Acute systolic CHF (congestive heart failure) ([place]) Active Problems:   Acute respiratory failure with hypercapnia ([place])   Hypertension   Hypertensive heart disease with CHF (congestive heart failure) ([facility])   NSVT (nonsustained ventricular tachycardia) ([facility])   Hypercholesterolemia   Asthma   PAT (paroxysmal atrial tachycardia) ([facility])   Hypokalemia     Subjective   Breathing much improved.  Slept well last night.  For lexiscan mv this am.  No chest pain.  Inpatient Medications    . aspirin EC  81 mg Oral Daily  . azithromycin  500 mg Oral Daily  . budesonide (PULMICORT) nebulizer solution  0.25 mg Nebulization BID  . cholecalciferol  2,000 Units Oral Daily  . docusate sodium  100 mg Oral Daily  . enoxaparin (LOVENOX) injection  40 mg Subcutaneous QHS  . furosemide  40 mg Oral BID  . ipratropium-albuterol  3 mL Nebulization Q6H  . metoprolol tartrate  12.5 mg Oral BID  . polyethylene glycol  17 g Oral Daily  . potassium chloride  40 mEq Oral Daily  . sacubitril-valsartan  1 tablet Oral BID  . sodium chloride flush  3 mL Intravenous Q12H  . spironolactone  12.5 mg Oral Daily    Vital Signs    Vitals:   02/01/16 2038 02/01/16 2121 02/02/16 0457 02/02/16 0756  BP: (!) 101/54  120/69   Pulse: 72  78   Resp: 18  18   Temp: 98.4 F (36.9 C)  98 F (36.7 C)   TempSrc: Oral  Oral   SpO2: 96% 97% 92% 93%  Weight:   126 lb 6.4 oz (57.3 kg)   Height:        Intake/Output Summary (Last 24 hours) at 02/02/16 0935 Last data filed at 02/02/16 0640  Gross per 24 hour  Intake              480 ml  Output              650 ml  Net             -170 ml   Filed Weights   01/31/16 0505 02/01/16 0351 02/02/16 0457  Weight: 126 lb 1.6 oz (57.2 kg) 128 lb 14.4 oz (58.5 kg) 126 lb 6.4 oz (57.3 kg)    Physical Exam   GEN: Well  nourished, well developed, in no acute distress.  HEENT: Grossly normal.  Neck: Supple, no JVD, carotid bruits, or masses. Cardiac: RRR, no murmurs, rubs, or gallops. No clubbing, cyanosis, edema.  Radials/DP/PT 2+ and equal bilaterally.  Respiratory:  Respirations regular and unlabored, diminished breath sounds bilaterally. GI: Soft, nontender, nondistended, BS + x 4. MS: no deformity or atrophy. Skin: warm and dry, no rash. Neuro:  Strength and sensation are intact. Psych: AAOx3.  Normal affect.  Labs    Basic Metabolic Panel  Recent Labs  02/01/16 0427 02/02/16 0535  NA 135 136  K 4.5 4.8  CL 91* 91*  CO2 39* 38*  GLUCOSE 110* 103*  BUN 18 24*  CREATININE 0.80 0.87  CALCIUM 8.7* 8.8*  MG 2.1  --     Telemetry    RSR, PVC's.  Patient Profile     83 y.o. female without prior cardiac history who was admitted on January 3 with a two-week history of progressive dyspnea and edema.  Echo this admission shows LV dysfunction with an EF of 25-30% and severe diffuse hypokinesis. Troponins are normal.  Assessment & Plan    1.  Acute systolic CHF:   Patient presented January 3 with a two-week history of progressive dyspnea and edema. She was found to have pulmonary edema on chest x-ray and volume overload on exam.  EF 25-30% by echo.  Minus 4.9L for admission.  Wt down to 126 lbs.  Diminished breath sounds but overall euvolemic appearing.  Symptomatically much improved but still with chest congestion and wet cough.  Cont PO lasix, lopressor (switched from coreg due to soft bp's - will look to consolidate prior to d/c), entresto, spiro.  Myoview today to assess for ischemia.  If present, would plan cath.  Doubt she'd be a good candidate for ICD therapy, thus lifevest not likely appropriate.  2.  Hypertensive Heart Dzs:  Stable on  blocker, entresto, po lasix, and spiro.  3.  NSVT:  Quiescent.  Cont low dose  blocker.  4.  PAT:  Quiescent.  Cont low dose  blocker.   5.   Asthma/COPD exacerbation:  No wheezing.  Diminished breath sounds and wet cough.  Inhalers/abx per IM.  Signed, Murray Hodgkins NP 02/02/2016, 9:35 AM

## 2016-02-02 NOTE — Progress Notes (Signed)
Pt returns to unit s/p stress test, VSS, no complaints of pain. I will continue to assess.

## 2016-02-02 NOTE — Care Management (Signed)
Attending discussed results of overnight oximetry.  There is concern that patient was hypoxic a good deal of the time .  CM looked at print out and did not see that patient desatted during the approx 6 hour test on 1 liter 02.  She has not been qualified for continuous 02.  Blood gases have not been ordered since 1/4 and at that time PCO2 was 88 and decreased to 70 after bipap started.  Anticipate Home health nursing and physical therapy at discharge.  may benefit from occupational therapy for energy conservation.  Has a troponin today of 1.21.  Zoll Vest was discussed but since patient would not be a candidate for AICD, there would not be beneficial to pursue Zoll life Vest.

## 2016-02-02 NOTE — Progress Notes (Signed)
SATURATION QUALIFICATIONS: (This note is used to comply with regulatory documentation for home oxygen)  Patient Saturations on Room Air at Rest =89 %  Patient Saturations on Room Air while Ambulating = 88%  Patient Saturations on 2 Liters of oxygen while Ambulating =91%  Please briefly explain why patient needs home oxygen:pro

## 2016-02-02 NOTE — Care Management Important Message (Signed)
Important Message  Patient Details  Name: Kathy Dominguez MRN: [id number] Date of Birth: Jul 11, 1928   Medicare Important Message Given:  Yes    Katrina Stack, RN 02/02/2016, 9:23 AM

## 2016-02-02 NOTE — Progress Notes (Signed)
MD notified. Pts SBP is <100 upon returning from stress test. Orders to hold metoprolol and lasix this a.m. I will continue to assess.

## 2016-02-02 NOTE — Progress Notes (Signed)
Family Meeting Note  Advance Directive:no  Today a meeting took place with the Patient.  The following clinical team members were present during this meeting:MD  The following were discussed:Patient's diagnosis: , Patient's progosis: > 12 months and Goals for treatment: DNR  Additional follow-up to be provided: CODE STATUS changed from full code to DO NOT RESUSCITATE.  Patient in agreement.  Time spent during discussion:20 minutes  Max Sane, MD

## 2016-02-02 NOTE — Progress Notes (Signed)
Patient tolerated bipap better tonight.  Titrated pressures so that patient could rest better. Placed on around 11pm. Ended appx 6am.  Patient in no distress.

## 2016-02-02 NOTE — Progress Notes (Signed)
Pt troponin is 1.21 Arida made verabally aware. I will continue to assess and monitor for any new orders.

## 2016-02-02 NOTE — Progress Notes (Signed)
[name], female   DOB: 1928/08/17, 83 y.o.   MRN: [id number]  [employer] PROGRESS NOTE  Kathy Dominguez [id number] DOB: [birth year] DOA: 01/28/2016 PCP: Einar Pheasant, MD  HPI/Subjective: Patient breathing a little bit better today. Occassional rattling in chest. Coughing.  stress test today  Objective: Vitals:   02/01/16 2038 02/02/16 0457  BP: (!) 101/54 120/69  Pulse: 72 78  Resp: 18 18  Temp: 98.4 F (36.9 C) 98 F (36.7 C)    Filed Weights   01/31/16 0505 02/01/16 0351 02/02/16 0457  Weight: 57.2 kg (126 lb 1.6 oz) 58.5 kg (128 lb 14.4 oz) 57.3 kg (126 lb 6.4 oz)    ROS: Review of Systems  Constitutional: Negative for chills, fever and weight loss.  HENT: Negative for nosebleeds and sore throat.   Eyes: Negative for blurred vision.  Respiratory: Positive for cough and shortness of breath. Negative for wheezing.   Cardiovascular: Negative for chest pain, orthopnea, leg swelling and PND.  Gastrointestinal: Positive for constipation. Negative for abdominal pain, diarrhea, heartburn, nausea and vomiting.  Genitourinary: Negative for dysuria and urgency.  Musculoskeletal: Negative for back pain and joint pain.  Skin: Negative for rash.  Neurological: Negative for dizziness, speech change, focal weakness and headaches.  Endo/Heme/Allergies: Does not bruise/bleed easily.  Psychiatric/Behavioral: Negative for depression.   Exam: Physical Exam  Constitutional: She is oriented to person, place, and time.  HENT:  Nose: No mucosal edema.  Mouth/Throat: No oropharyngeal exudate or posterior oropharyngeal edema.  Eyes: Conjunctivae, EOM and lids are normal. Pupils are equal, round, and reactive to light.  Neck: No JVD present. Carotid bruit is not present. No edema present. No thyroid mass and no thyromegaly present.  Cardiovascular: S1 normal and S2 normal.  Exam reveals no gallop.   Murmur heard.  Systolic murmur is present with a grade of 2/6   Pulses:      Dorsalis pedis pulses are 2+ on the right side, and 2+ on the left side.  Respiratory: No respiratory distress. She has decreased breath sounds in the right lower field and the left lower field. She has no wheezes. She has no rhonchi. She has no rales.  GI: Soft. Bowel sounds are normal. There is no tenderness.  Musculoskeletal:       Right ankle: She exhibits no swelling.       Left ankle: She exhibits no swelling.  Lymphadenopathy:    She has no cervical adenopathy.  Neurological: She is alert and oriented to person, place, and time. No cranial nerve deficit.  Skin: Skin is warm. No rash noted. Nails show no clubbing.  Psychiatric: She has a normal mood and affect.      Data Reviewed: Basic Metabolic Panel:  Recent Labs Lab 01/29/16 0404 01/30/16 0432 01/31/16 0633 02/01/16 0427 02/02/16 0535  NA 141 141 137 135 136  K 3.7 3.3* 4.4 4.5 4.8  CL 104 96* 93* 91* 91*  CO2 34* 41* 36* 39* 38*  GLUCOSE 110* 110* 100* 110* 103*  BUN 15 12 14 18  24*  CREATININE 0.74 0.74 0.61 0.80 0.87  CALCIUM 8.9 8.4* 8.7* 8.7* 8.8*  MG 1.9  --   --  2.1  --    CBC:  Recent Labs Lab 01/28/16 1728 01/29/16 0404  WBC 3.8 3.6  HGB 13.5 14.4  HCT 40.9 44.7  MCV 90.8 93.8  PLT 181 171   Cardiac Enzymes:  Recent Labs Lab 01/28/16 1728 01/29/16 0404 01/29/16 0943 01/29/16 1528  TROPONINI <0.03 <0.03 <0.03 <0.03   BNP (last 3 results)  Recent Labs  01/28/16 1728 01/30/16 0432  BNP 795.0* 633.0*   CBG:  Recent Labs Lab 01/31/16 0738  GLUCAP 120*    Scheduled Meds: . aspirin EC  81 mg Oral Daily  . azithromycin  500 mg Oral Daily  . budesonide (PULMICORT) nebulizer solution  0.25 mg Nebulization BID  . cholecalciferol  2,000 Units Oral Daily  . docusate sodium  100 mg Oral Daily  . enoxaparin (LOVENOX) injection  40 mg Subcutaneous QHS  . furosemide  40 mg Oral BID  . ipratropium-albuterol  3 mL Nebulization Q6H  . metoprolol tartrate  12.5 mg Oral  BID  . polyethylene glycol  17 g Oral Daily  . potassium chloride  40 mEq Oral Daily  . sacubitril-valsartan  1 tablet Oral BID  . sodium chloride flush  3 mL Intravenous Q12H  . spironolactone  12.5 mg Oral Daily    Assessment/Plan:  1. Acute respiratory failure with hypoxia and hypercapnia. Trying to taper off nasal cannula. With PCO2 of 88 on initial blood gas the patient would be a candidate for noninvasive ventilation at home.  Overnight oximetry showed hypoxia 100% of the time with a large amount of time spent in the 50s with her saturations. BiPAP at night while here. Will see if she qualifies for oxygen during the day also.  Check ambulatory pulse ox today 2. Acute systolic CHF exacerbation. Switched to by mouth Lasix. Continue metoprolol, entresto, and spironolactone. With ejection fraction 25-30% would be a candidate for a LifeVest. Diuresed 4.9 L thus for.  Schedule for stress test for ischemic workup today 3. Acute bronchitis/COPD secondary to secondhand smoke. Started DuoNeb nebulizer solution and budesonide. We will need inhalers prior to going home.  We will add Zithromax as she is still coughing for acute bronchitis.  When necessary Robitussin 4. Nonsustained ventricular tachycardia: Continue metoprolol, Asymptomatic 5. Weakness. Physical therapy evaluation recommended home with home health  Code Status: DO NOT RESUSCITATE  Family Communication:Daughter at bedside Disposition Plan: Home with home health on Tuesday depending on clinical course And stress test result scheduled today  Consultants:  Cardiology  Time spent: 28 minutes  Lachlan Mckim [employer]

## 2016-02-03 ENCOUNTER — Encounter: Payer: Self-pay | Admitting: Nurse Practitioner

## 2016-02-03 ENCOUNTER — Telehealth: Payer: Self-pay | Admitting: Internal Medicine

## 2016-02-03 ENCOUNTER — Inpatient Hospital Stay: Payer: Medicare HMO

## 2016-02-03 LAB — BASIC METABOLIC PANEL
Anion gap: 8 (ref 5–15)
BUN: 25 mg/dL — AB (ref 6–20)
CO2: 36 mmol/L — ABNORMAL HIGH (ref 22–32)
Calcium: 8.8 mg/dL — ABNORMAL LOW (ref 8.9–10.3)
Chloride: 94 mmol/L — ABNORMAL LOW (ref 101–111)
Creatinine, Ser: 0.81 mg/dL (ref 0.44–1.00)
GFR calc Af Amer: >60 mL/min (ref >60)
GLUCOSE: 103 mg/dL — AB (ref 65–99)
POTASSIUM: 5 mmol/L (ref 3.5–5.1)
Sodium: 138 mmol/L (ref 135–145)

## 2016-02-03 LAB — CBC
HCT: 40.8 % (ref 35.0–47.0)
Hemoglobin: 13.8 g/dL (ref 12.0–16.0)
MCH: 30.4 pg (ref 26.0–34.0)
MCHC: 33.7 g/dL (ref 32.0–36.0)
MCV: 90.3 fL (ref 80.0–100.0)
PLATELETS: 176 10*3/uL (ref 150–440)
RBC: 4.52 MIL/uL (ref 3.80–5.20)
RDW: 13.8 % (ref 11.5–14.5)
WBC: 3.1 10*3/uL — ABNORMAL LOW (ref 3.6–11.0)

## 2016-02-03 MED ORDER — FUROSEMIDE 40 MG PO TABS
40.0000 mg | ORAL_TABLET | Freq: Every day | ORAL | Status: DC
  Administered 2016-02-03: 40 mg via ORAL
  Filled 2016-02-03: qty 1

## 2016-02-03 MED ORDER — SACUBITRIL-VALSARTAN 24-26 MG PO TABS: 1.0000 | ORAL_TABLET | Freq: Every day | ORAL | 0 refills | Status: DC

## 2016-02-03 MED ORDER — METOPROLOL SUCCINATE ER 25 MG PO TB24
25.0000 mg | ORAL_TABLET | Freq: Every day | ORAL | Status: DC
  Administered 2016-02-03: 25 mg via ORAL
  Filled 2016-02-03: qty 1

## 2016-02-03 MED ORDER — GUAIFENESIN-DM 100-10 MG/5ML PO SYRP: 5.0000 mL | ORAL_SOLUTION | ORAL | 0 refills | Status: DC | PRN

## 2016-02-03 MED ORDER — FUROSEMIDE 40 MG PO TABS: 40.0000 mg | ORAL_TABLET | Freq: Every day | ORAL | Status: DC

## 2016-02-03 MED ORDER — POTASSIUM CHLORIDE CRYS ER 20 MEQ PO TBCR: 20.0000 meq | EXTENDED_RELEASE_TABLET | Freq: Every day | ORAL | 0 refills | Status: DC

## 2016-02-03 MED ORDER — FUROSEMIDE 40 MG PO TABS: 40.0000 mg | ORAL_TABLET | Freq: Every day | ORAL | 0 refills | Status: DC

## 2016-02-03 MED ORDER — METOPROLOL SUCCINATE ER 25 MG PO TB24: 25.0000 mg | ORAL_TABLET | Freq: Every day | ORAL | 0 refills | Status: DC

## 2016-02-03 MED ORDER — SPIRONOLACTONE 25 MG PO TABS: 12.5000 mg | ORAL_TABLET | Freq: Every day | ORAL | 0 refills | Status: DC

## 2016-02-03 MED ORDER — AZITHROMYCIN 500 MG PO TABS: 500.0000 mg | ORAL_TABLET | Freq: Every day | ORAL | 0 refills | Status: DC

## 2016-02-03 NOTE — Care Management (Signed)
Provided patient with 30 day trail coupon for [facility].  Primary nurse reports that patient is for chest xray due to poor air movement on exam.  Discharge may have to be delayed

## 2016-02-03 NOTE — Care Management (Signed)
Contacted [facility] for new home 02 and SN and PT as agency is in network with patient's insurance.  Patient and son in agreement

## 2016-02-03 NOTE — Telephone Encounter (Signed)
Sherry from [facility] called for a 1 week hfu for pt. She was in for Acute CHF, she is being discharged today. Please advise, thank you!  Call [facility] @ [phone]  Call pt @ [phone]

## 2016-02-03 NOTE — Progress Notes (Signed)
Initial Heart Failure Clinic appointment scheduled on February 16, 2016 at 9:30am. Thank you for the referral.

## 2016-02-03 NOTE — Progress Notes (Signed)
Discharge instructions explained to pt and pts son/ verbalized an understanding/ iv and tele removed/  Home health set up and home o2 delivered to room/ will transport off unit via wheelchair.

## 2016-02-03 NOTE — Progress Notes (Signed)
Patient Name: Kathy Dominguez Date of Encounter: 02/03/2016  Primary Cardiologist: Johnny Bridge, MD   Hospital Problem List     Principal Problem:   Acute systolic CHF (congestive heart failure) ([place]) Active Problems:   Acute respiratory failure with hypercapnia ([place])   Hypertension   Hypertensive heart disease with CHF (congestive heart failure) ([facility])   NSVT (nonsustained ventricular tachycardia) ([facility])   Hypercholesterolemia   Asthma   PAT (paroxysmal atrial tachycardia) ([facility])   Hypokalemia     Subjective   Overall feels stable - no significat dyspnea @ rest but drops sats into high 80's with ambulation.  Eager to go home but has been coughing - wet, mildly productive.  Inpatient Medications    . aspirin EC  81 mg Oral Daily  . azithromycin  500 mg Oral Daily  . budesonide (PULMICORT) nebulizer solution  0.25 mg Nebulization BID  . cholecalciferol  2,000 Units Oral Daily  . docusate sodium  100 mg Oral Daily  . enoxaparin (LOVENOX) injection  40 mg Subcutaneous QHS  . [START ON 02/04/2016] furosemide  40 mg Oral Daily  . ipratropium-albuterol  3 mL Nebulization Q6H  . metoprolol succinate  25 mg Oral Daily  . polyethylene glycol  17 g Oral Daily  . sacubitril-valsartan  1 tablet Oral BID  . sodium chloride flush  3 mL Intravenous Q12H  . spironolactone  12.5 mg Oral Daily    Vital Signs    Vitals:   02/02/16 2040 02/02/16 2056 02/03/16 0408 02/03/16 0735  BP: (!) 85/53  107/62 126/69  Pulse: 81  78 84  Resp:   18 18  Temp:   97.8 F (36.6 C)   TempSrc:   Oral   SpO2: 93% 94% 96% 97%  Weight:   126 lb 14.4 oz (57.6 kg)   Height:        Intake/Output Summary (Last 24 hours) at 02/03/16 0845 Last data filed at 02/03/16 0610  Gross per 24 hour  Intake                0 ml  Output              450 ml  Net             -450 ml   Filed Weights   02/01/16 0351 02/02/16 0457 02/03/16 0408  Weight: 128 lb 14.4 oz (58.5 kg) 126 lb 6.4 oz (57.3 kg) 126 lb 14.4 oz  (57.6 kg)    Physical Exam   GEN: Well nourished, well developed, in no acute distress.  HEENT: Grossly normal.  Neck: Supple, no JVD, carotid bruits, or masses. Cardiac: RRR, no murmurs, rubs, or gallops. No clubbing, cyanosis, edema.  Radials/DP/PT 2+ and equal bilaterally.  Respiratory:  Respirations regular and unlabored, markedly diminished breath sounds in bilat bases - 1/2 way up.  No crackles. GI: Soft, nontender, nondistended, BS + x 4. MS: no deformity or atrophy. Skin: warm and dry, no rash. Neuro:  Strength and sensation are intact. Psych: AAOx3.  Normal affect.  Labs    CBC  Recent Labs  02/03/16 0518  WBC 3.1*  HGB 13.8  HCT 40.8  MCV 90.3  PLT 0000000   Basic Metabolic Panel  Recent Labs  02/01/16 0427 02/02/16 0535 02/03/16 0518  NA 135 136 138  K 4.5 4.8 5.0  CL 91* 91* 94*  CO2 39* 38* 36*  GLUCOSE 110* 103* 103*  BUN 18 24* 25*  CREATININE 0.80 0.87 0.81  CALCIUM 8.7* 8.8* 8.8*  MG 2.1  --   --     Telemetry    rsr  Radiology    Dg Chest 2 View  Result Date: 02/02/2016 CLINICAL DATA:  Dyspnea. EXAM: CHEST  2 VIEW COMPARISON:  Radiograph of January 29, 2016. FINDINGS: Stable cardiomegaly. No pneumothorax or pleural effusion is noted. No acute pulmonary disease is noted. Bony thorax is unremarkable. IMPRESSION: No active cardiopulmonary disease. Electronically Signed   By: Marijo Conception, M.D.   On: 02/02/2016 10:37   Nm Myocar Multi W/spect W/wall Motion / Ef  Result Date: 02/02/2016  There was no ST segment deviation noted during stress.  Defect 1: There is a small defect of mild severity present in the apex location. This is likely due to breast attenuation.  This is a high risk study due to low EF.  Nuclear stress EF: 19%.  No evidence of ischemia on this study.     Patient Profile     83 y.o.femalewithout prior cardiac history who was admitted on January 3 with a two-week history of progressive dyspnea and edema. Echo this  admission shows LV dysfunction with an EF of 25-30% and severe diffuse hypokinesis. Troponins are normal.  Myoview 1/8 w/o ischemia.  Assessment & Plan    1.  Acute systolic CHF/NICM: presented 1/3 with 2 wk h/o progressive dyspnea/edema  CHF on cxr.  EF 25-30% by echo.  Diuresed well - minus 5.4L for admission.  Wt stable @ 126 lbs x 2 days.  Euvolemic on exam.  Myoview 1/8 w/o evidence for ischemia.  Continue medical therapy.  Consolidate  blocker to toprol xl 25 daily.  Cont entresto @ current dose - not much bp room to titrate.  Will reduce lasix to 40 daily.  Cont spiro @ 12.5 mg daily.  D/c potassium given mild hyperkalemia.  I will arrange for office f/u within the next 7-10 days.  2.  Hypoxia:  Dropping sats into 80's with ambulation.  Has had wet/congested cough.  Very poor air mvmt on exam.  F/u cxr now to r/o pna/effusions. Afebrile, mildly leukopenic. Suspect d/c will have to be delayed today.  3.   HTN Heart Dzs:  bp stable on  blocker, entresto, diuretics.  4.  NSVT:  Quiescent on  blocker.  5.  PAT:  Quiescent on  blocker.   6.  Asthma/COPD Exacerbation:  No wheezing.  Poor air mvmt.  F/u cxr.  Signed, Murray Hodgkins NP 02/03/2016, 8:45 AM  _____________   Addendum 02/03/2016 10:08 AM:  PA & Lat CXR this am: IMPRESSION: Cardiomegaly, stable. No pulmonary vascular congestion. No evidence of pneumonia. Thoracic aortic atherosclerosis.  I discussed with patient and re-examined - still markedly diminished breath sounds bilat even with max effort.  CXR reassuring however.  [place] for d/c from cardiology standpoint with med changes as outlined above.  I will see her back on 1/19 @ 3 PM (info listed in f/u appts).  I will f/u bmet @ that time.  Murray Hodgkins, NP 02/03/2016, 10:10 AM

## 2016-02-03 NOTE — Telephone Encounter (Signed)
If you will do the TCM call and send to me, I will find a place for you.  Thanks.

## 2016-02-03 NOTE — Discharge Instructions (Signed)
Heart Failure Clinic appointment on February 16, 2016 at 9:30am with Kathy Dominguez, [place]. Please call [phone] to reschedule.   Heart Failure Heart failure means your heart has trouble pumping blood. This makes it hard for your body to work well. Heart failure is usually a long-term (chronic) condition. You must take good care of yourself and follow your doctor's treatment plan. HOME CARE  Take your heart medicine as told by your doctor.  Do not stop taking medicine unless your doctor tells you to.  Do not skip any dose of medicine.  Refill your medicines before they run out.  Take other medicines only as told by your doctor or pharmacist.  Stay active if told by your doctor. The elderly and people with severe heart failure should talk with a doctor about physical activity.  Eat heart-healthy foods. Choose foods that are without trans fat and are low in saturated fat, cholesterol, and salt (sodium). This includes fresh or frozen fruits and vegetables, fish, lean meats, fat-free or low-fat dairy foods, whole grains, and high-fiber foods. Lentils and dried peas and beans (legumes) are also good choices.  Limit salt if told by your doctor.  Cook in a healthy way. Roast, grill, broil, bake, poach, steam, or stir-fry foods.  Limit fluids as told by your doctor.  Weigh yourself every morning. Do this after you pee (urinate) and before you eat breakfast. Write down your weight to give to your doctor.  Take your blood pressure and write it down if your doctor tells you to.  Ask your doctor how to check your pulse. Check your pulse as told.  Lose weight if told by your doctor.  Stop smoking or chewing tobacco. Do not use gum or patches that help you quit without your doctor's approval.  Schedule and go to doctor visits as told.  Nonpregnant women should have no more than 1 drink a day. Men should have no more than 2 drinks a day. Talk to your doctor about drinking alcohol.  Stop  illegal drug use.  Stay current with shots (immunizations).  Manage your health conditions as told by your doctor.  Learn to manage your stress.  Rest when you are tired.  If it is really hot outside:  Avoid intense activities.  Use air conditioning or fans, or get in a cooler place.  Avoid caffeine and alcohol.  Wear loose-fitting, lightweight, and light-colored clothing.  If it is really cold outside:  Avoid intense activities.  Layer your clothing.  Wear mittens or gloves, a hat, and a scarf when going outside.  Avoid alcohol.  Learn about heart failure and get support as needed.  Get help to maintain or improve your quality of life and your ability to care for yourself as needed. GET HELP IF:   You gain weight quickly.  You are more short of breath than usual.  You cannot do your normal activities.  You tire easily.  You cough more than normal, especially with activity.  You have any or more puffiness (swelling) in areas such as your hands, feet, ankles, or belly (abdomen).  You cannot sleep because it is hard to breathe.  You feel like your heart is beating fast (palpitations).  You get dizzy or light-headed when you stand up. GET HELP RIGHT AWAY IF:   You have trouble breathing.  There is a change in mental status, such as becoming less alert or not being able to focus.  You have chest pain or discomfort.  You faint.  MAKE SURE YOU:   Understand these instructions.  Will watch your condition.  Will get help right away if you are not doing well or get worse. This information is not intended to replace advice given to you by your health care provider. Make sure you discuss any questions you have with your health care provider. Document Released: 10/21/2007 Document Revised: 02/01/2014 Document Reviewed: 02/28/2012 Elsevier Interactive Patient Education  2017 [place], Adult Cardiomyopathy is a long-term (chronic)  disease of the heart muscle. The disease makes the heart muscle thick, weak, or stiff. As a result, the heart works harder to pump blood. Over time, cardiomyopathy can lead to heart failure. There are several types of cardiomyopathy:  Dilated cardiomyopathy. This type causes the ventricles to become weak and stretched out.  Hypertrophic cardiomyopathy. This type causes the heart muscle to thicken.  Restrictive cardiomyopathy. This type causes the heart muscle to become stiff.  Ischemic cardiomyopathy. This type involves narrowing arteries that cause the walls of the heart to get thinner.  Peripartum cardiomyopathy. This type occurs during pregnancy or shortly after pregnancy. What are the causes? This condition may be caused by:  A gene that is passed down (inherited) from a family member.  A medical condition that damages the heart, such as:  Diabetes.  High blood pressure.  Viral infection of the heart.  Heart attack.  Coronary heart disease.  Alcoholism.  Using illegal drugs or some prescription medicines.  Pregnancy.  Your body absorbing and storing too much iron (hemochromatosis).  Autoimmune diseases, connective tissue diseases, endocrine diseases, and muscle diseases.  Cancer treatments.  Buildup of proteins in your organs (amyloidosis), or inflammation in your organs (sarcoidosis). Often, the cause is not known. What increases the risk? This condition is more likely to develop in people who:  Have a family history of cardiomyopathy or other heart problems.  Are overweight or obese.  Use illegal drugs.  Abuse alcohol.  Have a medical condition that damages the heart. What are the signs or symptoms? Symptoms of this condition include:  Shortness of breath, especially during activity.  Fatigue.  An irregular heartbeat and heart murmurs.  Dizziness.  Light-headedness.  Fainting.  Chest pain.  Coughing.  Swelling in the lower legs, ankles,  feet, abdomen, and neck veins. Often, people with this condition have no symptoms. How is this diagnosed? This condition is diagnosed based on:  Your symptoms and medical history.  A physical exam.  Tests. Tests may include:  Blood tests.  Imaging studies of your heart, such as:  X-rays.  An echocardiogram.  An MRI.  An electrocardiogram (ECG). This records your heart's electrical activity.  A test in which you wear a portable device (event monitor) to record your heart's electrical activity while you go about your day.  A stress test. This monitors your heart's activity while exercising.  Cardiac catheterization. This procedure checks the blood pressure and blood flow in your heart.  An angiogram. This is an injection of dye into your arteries before imaging studies are taken.  Heart tissue biopsy. This removes a sample of heart tissue for examination. How is this treated? Treatment for this condition depends on the type of cardiomyopathy you have and the severity of your symptoms. If you do not have symptoms, you may not need treatment. If you need treatment, it may include:  Lifestyle changes, such as:  Eating a heart-healthy diet that includes plenty of fruits, vegetables, and whole grains, and cutting down on  salt (sodium).  Maintaining a healthy weight, and losing weight, if needed.  Getting regular exercise.  Quitting smoking, if you smoke.  Avoiding alcohol.  Medicine to:  Lower your blood pressure.  Slow down your heart rate.  Keep your heart beating in a steady rhythm.  Clear excess fluids from your body.  Prevent blood clots.  Balance minerals (electrolytes) in your body and get rid of extra sodium in your body.  Reduce inflammation.  Strengthen your heartbeat.  Surgery to:  Repair a defect.  Remove thickened tissue.  Destroy tissues in the area of abnormal electrical activity (ablation).  Implant a device to treat serious heart  rhythm problems (implantable cardioverter-defibrillator, or ICD), or a pacemaker.  Replace your heart (heart transplant) if all other treatments have failed (end stage). Other treatments may include cardiac resynchronization therapy (CRT) or a left ventricular assist device (LVAD). Follow these instructions at home: Lifestyle  Eat a heart-healthy diet. Work with your health care provider or a registered dietitian to learn about healthy eating options.  Maintain a healthy weight.  Stay physically active. Ask your health care provider to suggest some activities that are good for you.  Do not use any products that contain nicotine or tobacco, such as cigarettes and e-cigarettes. If you need help quitting, ask your health care provider.  Limit alcohol intake to no more than one drink per day for nonpregnant women and no more than two drinks per day for men. One drink equals 12 oz of beer, 5 oz of wine, or 1 oz of hard liquor.  Try to get at least 7 hours of sleep each night.  Find healthy ways to manage stress. General instructions  Take over-the-counter and prescription medicines only as told by your health care provider. Some medicines can be dangerous for your heart.  Tell all health care providers, including your dentist, that you have cardiomyopathy. When you visit the dentist or have surgery, ask your health care provider if you need antibiotics before having dental care or before the surgery.  Ask your health care provider if you should wear a medical identification bracelet. This may be important if you have a pacemaker or a defibrillator.  Make sure you get all recommended vaccinations and an annual flu shot.  Work closely with your health care provider to manage any long-lasting (chronic) conditions.  Keep all follow-up visits as told by your health care provider. This is important, even if you do not have any symptoms. Your health care provider may need to make sure your  condition is not getting worse. How is this prevented? This condition cannot be prevented. Parents, siblings, and children of people with this condition may be at risk for the condition. It is a good idea for them to get screened for the condition because it is best when cardiomyopathy is found early. Screening is done with an ECG and echocardiogram. People who want to start a family may also want to meet with a genetic counselor to discuss the risk of having a child with cardiomyopathy. Contact a health care provider if:  Your symptoms get worse.  You have new symptoms. Get help right away if:  You have severe chest pain.  You have shortness of breath.  You cough up pink, bubbly material.  You have sudden sweating.  You feel nauseous and you vomit.  You suddenly become light-headed or dizzy.  You feel your heart beating very quickly.  It feels like your heart is skipping beats. These  symptoms may represent a serious problem that is an emergency. Do not wait to see if the symptoms will go away. Get medical help right away. Call your local emergency services (911 in the [place] Do not drive yourself to the hospital.  This information is not intended to replace advice given to you by your health care provider. Make sure you discuss any questions you have with your health care provider. Document Released: 03/26/2004 Document Revised: 09/09/2015 Document Reviewed: 07/14/2015 Elsevier Interactive Patient Education  2017 [employer].

## 2016-02-03 NOTE — Telephone Encounter (Signed)
Where can I add patient. For HFU for CHF?

## 2016-02-04 NOTE — Telephone Encounter (Signed)
Transition Care Management Follow-up Telephone Call  How have you been since you were released from the hospital? Weak but better than she felt.   Do you understand why you were in the hospital?  SOB and ankles swelling , was advised by PCP office she needed to go to ER.   Do you understand the discharge instrcutions? Yes  Items Reviewed:  Medications reviewed: Yes  Allergies reviewed: Yes  Dietary changes reviewed: yes  Referrals reviewed: Yes, Dr. Raul Del 1/30//18 and Dr. Sharolyn Douglas, Vascular 02/13/16 Dr.Hackney 02/16/16 CHF clinic   Functional Questionnaire:   Activities of Daily Living (ADLs):   She states they are independent in the following: Patient son helping with preparing meals and ADLS States they require assistance with the following:  Cooking preparing meals.   Any transportation issues/concerns?: No   Any patient concerns? No   Confirmed importance and date/time of follow-up visits scheduled: yes 02/11/16 at 10 am}   Confirmed with patient if condition begins to worsen call PCP or go to the ER.  Patient was given the Call-a-Nurse line [phone]: yes

## 2016-02-04 NOTE — Telephone Encounter (Signed)
Corrrection [facility] not [employer].

## 2016-02-04 NOTE — Telephone Encounter (Deleted)
[facility]ed [facility] and advised [facility] as soon as patient is released I will call for TCM and schedule patient.

## 2016-02-04 NOTE — Telephone Encounter (Signed)
Patient scheduled for 02/11/16 @ 10

## 2016-02-06 NOTE — Discharge Summary (Signed)
[facility] NAME: Kathy Dominguez    MR#:  [id number]  DATE OF BIRTH:  08-08-1928  DATE OF ADMISSION:  01/28/2016   ADMITTING PHYSICIAN: Harvie Bridge, DO  DATE OF DISCHARGE: 02/03/2016 12:06 PM  PRIMARY CARE PHYSICIAN: Einar Pheasant, MD   ADMISSION DIAGNOSIS:  SOB (shortness of breath) [R06.02] Congestive heart failure, unspecified congestive heart failure chronicity, unspecified congestive heart failure type ([place]) [I50.9] DISCHARGE DIAGNOSIS:  Principal Problem:   Acute systolic CHF (congestive heart failure) (HCC) Active Problems:   Hypertension   Hypercholesterolemia   Asthma   Acute respiratory failure with hypercapnia (HCC)   Hypertensive heart disease with CHF (congestive heart failure) (HCC)   NSVT (nonsustained ventricular tachycardia) (HCC)   PAT (paroxysmal atrial tachycardia) (HCC)   Hypokalemia  SECONDARY DIAGNOSIS:   Past Medical History:  Diagnosis Date  . Chronic systolic CHF (congestive heart failure) ([place])    a. 01/2015 Echo: EF 25-30%, sev diff HK, mild to mod MR, mildly dil LA, nl RV fxn, PASP 61 mmHg.  Marland Kitchen Hypercholesterolemia   . Hypertensive heart disease with CHF (congestive heart failure) ([place])   . NICM (nonischemic cardiomyopathy) ([place])    a. 01/2015 Echo: EF 25-30% sev diff HK;  b. 01/2015 Lexi MV: EF 19%, small defect of mild severity in apex - likely breast attenuation, no ischemia.  . Osteoporosis    HOSPITAL COURSE:  83 y.o. female with a known history of hypertension, hyperlipidemia, osteoporosis admitted for shortness of breath and lower extremity edema  1. Acute respiratory failure with hypoxia and hypercapnia: due to CHF exacerbation. Resolved. will need O2 at D/C and was set up by cm * Acute systolic CHF/NICM: EF [id number] by echo.  Diuresed well - minus 5.4L for admission.  Wt stable @ 126 lbs x 2 days.  Euvolemic on exam.  Myoview 1/8 w/o evidence for ischemia.  Continue medical  therapy. Being D/Ced on ? blocker toprol xl 25 daily. entresto @ current dose - not much bp room to titrate.  lasix 40 daily. spiro @ 12.5 mg daily. outpt cardio office f/u within the next 7-10 days. 2. Acute bronchitis/COPD secondary to secondhand smoke. Improved with nebs, steroids, abx 3. Nonsustained ventricular tachycardia: Continue Toprol, Asymptomatic 4. Weakness. Physical therapy evaluation recommended home with home health  DISCHARGE CONDITIONS:  stable CONSULTS OBTAINED:  Treatment Team:  Minna Merritts, MD DRUG ALLERGIES:  No Known Allergies DISCHARGE MEDICATIONS:   Allergies as of 02/03/2016   No Known Allergies     Medication List    TAKE these medications   albuterol 108 (90 Base) MCG/ACT inhaler Commonly known as:  PROVENTIL HFA;VENTOLIN HFA Inhale 2 puffs into the lungs every 6 (six) hours as needed for wheezing or shortness of breath.   aspirin EC 81 MG EC tablet Generic drug:  aspirin Take 81 mg by mouth daily. Swallow whole.   azithromycin 500 MG tablet Commonly known as:  ZITHROMAX Take 1 tablet (500 mg total) by mouth daily.   furosemide 40 MG tablet Commonly known as:  LASIX Take 1 tablet (40 mg total) by mouth daily.   guaiFENesin-dextromethorphan 100-10 MG/5ML syrup Commonly known as:  ROBITUSSIN DM Take 5 mLs by mouth every 4 (four) hours as needed for cough.   meloxicam 15 MG tablet Commonly known as:  MOBIC Take 15 mg by mouth daily.   metoprolol succinate 25 MG 24 hr tablet Commonly known as:  TOPROL XL Take 1 tablet (25 mg  total) by mouth daily.   potassium chloride SA 20 MEQ tablet Commonly known as:  K-DUR,KLOR-CON Take 1 tablet (20 mEq total) by mouth daily.   sacubitril-valsartan 24-26 MG Commonly known as:  ENTRESTO Take 1 tablet by mouth daily.   spironolactone 25 MG tablet Commonly known as:  ALDACTONE Take 0.5 tablets (12.5 mg total) by mouth daily.   telmisartan 80 MG tablet Commonly known as:  MICARDIS Take 1 tablet  (80 mg total) by mouth daily.   Vitamin D-3 1000 units Caps Take 2,000 Units by mouth daily.        DISCHARGE INSTRUCTIONS:   DIET:  Regular diet DISCHARGE CONDITION:  Good ACTIVITY:  Activity as tolerated OXYGEN:  Home Oxygen: Yes.    Oxygen Delivery: 2 liters via [place] DISCHARGE LOCATION:  home with home health, PT  If you experience worsening of your admission symptoms, develop shortness of breath, life threatening emergency, suicidal or homicidal thoughts you must seek medical attention immediately by calling 911 or calling your MD immediately  if symptoms less severe.  You Must read complete instructions/literature along with all the possible adverse reactions/side effects for all the Medicines you take and that have been prescribed to you. Take any new Medicines after you have completely understood and accpet all the possible adverse reactions/side effects.   Please note  You were cared for by a hospitalist during your hospital stay. If you have any questions about your discharge medications or the care you received while you were in the hospital after you are discharged, you can call the unit and asked to speak with the hospitalist on call if the hospitalist that took care of you is not available. Once you are discharged, your primary care physician will handle any further medical issues. Please note that NO REFILLS for any discharge medications will be authorized once you are discharged, as it is imperative that you return to your primary care physician (or establish a relationship with a primary care physician if you do not have one) for your aftercare needs so that they can reassess your need for medications and monitor your lab values.    On the day of Discharge:  VITAL SIGNS:  Blood pressure 126/69, pulse 84, temperature 97.8 F (36.6 C), temperature source Oral, resp. rate 18, height 5\' 2"  (1.575 m), weight 57.6 kg (126 lb 14.4 oz), last menstrual period 02/21/1966,  SpO2 97 %. PHYSICAL EXAMINATION:  GENERAL:  83 y.o.-year-old patient lying in the bed with no acute distress.  EYES: Pupils equal, round, reactive to light and accommodation. No scleral icterus. Extraocular muscles intact.  HEENT: Head atraumatic, normocephalic. Oropharynx and nasopharynx clear.  NECK:  Supple, no jugular venous distention. No thyroid enlargement, no tenderness.  LUNGS: Normal breath sounds bilaterally, no wheezing, rales,rhonchi or crepitation. No use of accessory muscles of respiration.  CARDIOVASCULAR: S1, S2 normal. No murmurs, rubs, or gallops.  ABDOMEN: Soft, non-tender, non-distended. Bowel sounds present. No organomegaly or mass.  EXTREMITIES: No pedal edema, cyanosis, or clubbing.  NEUROLOGIC: Cranial nerves II through XII are intact. Muscle strength 5/5 in all extremities. Sensation intact. Gait not checked.  PSYCHIATRIC: The patient is alert and oriented x 3.  SKIN: No obvious rash, lesion, or ulcer.  DATA REVIEW:   CBC  Recent Labs Lab 02/03/16 0518  WBC 3.1*  HGB 13.8  HCT 40.8  PLT 176    Chemistries   Recent Labs Lab 02/01/16 0427  02/03/16 0518  NA 135  < > 138  K  4.5  < > 5.0  CL 91*  < > 94*  CO2 39*  < > 36*  GLUCOSE 110*  < > 103*  BUN 18  < > 25*  CREATININE 0.80  < > 0.81  CALCIUM 8.7*  < > 8.8*  MG 2.1  --   --   < > = values in this interval not displayed.   Follow-up Information    Einar Pheasant, MD. Schedule an appointment as soon as possible for a visit in 1 week(s).   Specialty:  Internal Medicine Why:  The office will call you with an appointment, ccs Contact information: [id number] [place]  [address] [phone]        Wallene Huh, MD. Schedule an appointment as soon as possible for a visit in 3 week(s).   Specialty:  Specialist Why:  Tuesday, January 30th at [place], ccs Contact information: [place]  [address] [place], NP Follow up on 02/13/2016.   Specialties:  Nurse Practitioner, Cardiology, Radiology Why:  3:00 PM Contact information: [address] [place] [address] [phone]        Alisa Graff, [place]. Go on 02/16/2016.   Specialty:  Family Medicine Why:  at 9:30am to the Heart Failure Clinic Contact information: [place] [address] [place] [address] [phone]           RADIOLOGY:  No results found.   Management plans discussed with the patient, family and they are in agreement.  CODE STATUS: DNR   TOTAL TIME TAKING CARE OF THIS PATIENT: 45 minutes.    Max Sane M.D on 02/06/2016 at 10:29 AM  Between 7am to 6pm - Pager - [phone]  After 6pm go to [email or web address] - Proofreader  [facility]  Office  [phone]  CC: Primary care physician; Einar Pheasant, MD   Note: This dictation was prepared with Dragon dictation along with smaller phrase technology. Any transcriptional errors that result from this process are unintentional.

## 2016-02-09 DIAGNOSIS — I472 Ventricular tachycardia: Secondary | ICD-10-CM | POA: Diagnosis not present

## 2016-02-09 DIAGNOSIS — Z791 Long term (current) use of non-steroidal anti-inflammatories (NSAID): Secondary | ICD-10-CM | POA: Diagnosis not present

## 2016-02-09 DIAGNOSIS — J440 Chronic obstructive pulmonary disease with acute lower respiratory infection: Secondary | ICD-10-CM | POA: Diagnosis not present

## 2016-02-09 DIAGNOSIS — Z9981 Dependence on supplemental oxygen: Secondary | ICD-10-CM | POA: Diagnosis not present

## 2016-02-09 DIAGNOSIS — M810 Age-related osteoporosis without current pathological fracture: Secondary | ICD-10-CM | POA: Diagnosis not present

## 2016-02-09 DIAGNOSIS — E7800 Pure hypercholesterolemia, unspecified: Secondary | ICD-10-CM | POA: Diagnosis not present

## 2016-02-09 DIAGNOSIS — J209 Acute bronchitis, unspecified: Secondary | ICD-10-CM | POA: Diagnosis not present

## 2016-02-09 DIAGNOSIS — I10 Essential (primary) hypertension: Secondary | ICD-10-CM | POA: Diagnosis not present

## 2016-02-09 DIAGNOSIS — I5022 Chronic systolic (congestive) heart failure: Secondary | ICD-10-CM | POA: Diagnosis not present

## 2016-02-09 DIAGNOSIS — Z7982 Long term (current) use of aspirin: Secondary | ICD-10-CM | POA: Diagnosis not present

## 2016-02-10 DIAGNOSIS — Z7982 Long term (current) use of aspirin: Secondary | ICD-10-CM | POA: Diagnosis not present

## 2016-02-10 DIAGNOSIS — I10 Essential (primary) hypertension: Secondary | ICD-10-CM | POA: Diagnosis not present

## 2016-02-10 DIAGNOSIS — I472 Ventricular tachycardia: Secondary | ICD-10-CM | POA: Diagnosis not present

## 2016-02-10 DIAGNOSIS — J440 Chronic obstructive pulmonary disease with acute lower respiratory infection: Secondary | ICD-10-CM | POA: Diagnosis not present

## 2016-02-10 DIAGNOSIS — J209 Acute bronchitis, unspecified: Secondary | ICD-10-CM | POA: Diagnosis not present

## 2016-02-10 DIAGNOSIS — Z791 Long term (current) use of non-steroidal anti-inflammatories (NSAID): Secondary | ICD-10-CM | POA: Diagnosis not present

## 2016-02-10 DIAGNOSIS — E7800 Pure hypercholesterolemia, unspecified: Secondary | ICD-10-CM | POA: Diagnosis not present

## 2016-02-10 DIAGNOSIS — M810 Age-related osteoporosis without current pathological fracture: Secondary | ICD-10-CM | POA: Diagnosis not present

## 2016-02-10 DIAGNOSIS — I5022 Chronic systolic (congestive) heart failure: Secondary | ICD-10-CM | POA: Diagnosis not present

## 2016-02-10 DIAGNOSIS — Z9981 Dependence on supplemental oxygen: Secondary | ICD-10-CM | POA: Diagnosis not present

## 2016-02-11 ENCOUNTER — Ambulatory Visit: Payer: Medicare HMO | Admitting: Internal Medicine

## 2016-02-12 DIAGNOSIS — Z791 Long term (current) use of non-steroidal anti-inflammatories (NSAID): Secondary | ICD-10-CM | POA: Diagnosis not present

## 2016-02-12 DIAGNOSIS — I5022 Chronic systolic (congestive) heart failure: Secondary | ICD-10-CM | POA: Diagnosis not present

## 2016-02-12 DIAGNOSIS — I10 Essential (primary) hypertension: Secondary | ICD-10-CM | POA: Diagnosis not present

## 2016-02-12 DIAGNOSIS — J440 Chronic obstructive pulmonary disease with acute lower respiratory infection: Secondary | ICD-10-CM | POA: Diagnosis not present

## 2016-02-12 DIAGNOSIS — E7800 Pure hypercholesterolemia, unspecified: Secondary | ICD-10-CM | POA: Diagnosis not present

## 2016-02-12 DIAGNOSIS — M810 Age-related osteoporosis without current pathological fracture: Secondary | ICD-10-CM | POA: Diagnosis not present

## 2016-02-12 DIAGNOSIS — Z9981 Dependence on supplemental oxygen: Secondary | ICD-10-CM | POA: Diagnosis not present

## 2016-02-12 DIAGNOSIS — I472 Ventricular tachycardia: Secondary | ICD-10-CM | POA: Diagnosis not present

## 2016-02-12 DIAGNOSIS — Z7982 Long term (current) use of aspirin: Secondary | ICD-10-CM | POA: Diagnosis not present

## 2016-02-12 DIAGNOSIS — J209 Acute bronchitis, unspecified: Secondary | ICD-10-CM | POA: Diagnosis not present

## 2016-02-13 ENCOUNTER — Ambulatory Visit (INDEPENDENT_AMBULATORY_CARE_PROVIDER_SITE_OTHER): Payer: Medicare HMO | Admitting: Nurse Practitioner

## 2016-02-13 ENCOUNTER — Encounter: Payer: Self-pay | Admitting: Nurse Practitioner

## 2016-02-13 VITALS — BP 130/68 | HR 60 | Ht 64.0 in | Wt 125.0 lb

## 2016-02-13 DIAGNOSIS — I472 Ventricular tachycardia: Secondary | ICD-10-CM | POA: Diagnosis not present

## 2016-02-13 DIAGNOSIS — I5021 Acute systolic (congestive) heart failure: Secondary | ICD-10-CM

## 2016-02-13 DIAGNOSIS — I10 Essential (primary) hypertension: Secondary | ICD-10-CM | POA: Diagnosis not present

## 2016-02-13 DIAGNOSIS — Z791 Long term (current) use of non-steroidal anti-inflammatories (NSAID): Secondary | ICD-10-CM | POA: Diagnosis not present

## 2016-02-13 DIAGNOSIS — I5022 Chronic systolic (congestive) heart failure: Secondary | ICD-10-CM

## 2016-02-13 DIAGNOSIS — Z9981 Dependence on supplemental oxygen: Secondary | ICD-10-CM | POA: Diagnosis not present

## 2016-02-13 DIAGNOSIS — J209 Acute bronchitis, unspecified: Secondary | ICD-10-CM | POA: Diagnosis not present

## 2016-02-13 DIAGNOSIS — J440 Chronic obstructive pulmonary disease with acute lower respiratory infection: Secondary | ICD-10-CM | POA: Diagnosis not present

## 2016-02-13 DIAGNOSIS — I4719 Other supraventricular tachycardia: Secondary | ICD-10-CM

## 2016-02-13 DIAGNOSIS — I110 Hypertensive heart disease with heart failure: Secondary | ICD-10-CM

## 2016-02-13 DIAGNOSIS — I428 Other cardiomyopathies: Secondary | ICD-10-CM

## 2016-02-13 DIAGNOSIS — E7800 Pure hypercholesterolemia, unspecified: Secondary | ICD-10-CM | POA: Diagnosis not present

## 2016-02-13 DIAGNOSIS — I4729 Other ventricular tachycardia: Secondary | ICD-10-CM

## 2016-02-13 DIAGNOSIS — M810 Age-related osteoporosis without current pathological fracture: Secondary | ICD-10-CM | POA: Diagnosis not present

## 2016-02-13 DIAGNOSIS — I471 Supraventricular tachycardia: Secondary | ICD-10-CM | POA: Diagnosis not present

## 2016-02-13 DIAGNOSIS — Z7982 Long term (current) use of aspirin: Secondary | ICD-10-CM | POA: Diagnosis not present

## 2016-02-13 MED ORDER — METOPROLOL SUCCINATE ER 25 MG PO TB24: 25.0000 mg | ORAL_TABLET | Freq: Every day | ORAL | 5 refills | Status: DC

## 2016-02-13 MED ORDER — FUROSEMIDE 40 MG PO TABS: 40.0000 mg | ORAL_TABLET | Freq: Every day | ORAL | 5 refills | Status: DC

## 2016-02-13 MED ORDER — SPIRONOLACTONE 25 MG PO TABS: 12.5000 mg | ORAL_TABLET | Freq: Every day | ORAL | 5 refills | Status: DC

## 2016-02-13 MED ORDER — POTASSIUM CHLORIDE CRYS ER 20 MEQ PO TBCR: 20.0000 meq | EXTENDED_RELEASE_TABLET | Freq: Every day | ORAL | 5 refills | Status: DC

## 2016-02-13 MED ORDER — SACUBITRIL-VALSARTAN 24-26 MG PO TABS: 1.0000 | ORAL_TABLET | Freq: Two times a day (BID) | ORAL | 5 refills | Status: DC

## 2016-02-13 NOTE — Patient Instructions (Signed)
Medication Instructions:  Your physician has recommended you make the following change in your medication: STOP taking micardis INCREASE entresto to 24-26mg  TWICE daily   Labwork: BMET today  Testing/Procedures: none  Follow-Up: Your physician recommends that you schedule a follow-up appointment in: one month with Dr. Rockey Situ.    Any Other Special Instructions Will Be Listed Below (If Applicable).     If you need a refill on your cardiac medications before your next appointment, please call your pharmacy.

## 2016-02-13 NOTE — Progress Notes (Signed)
Office Visit    Patient Name: Kathy Dominguez Date of Encounter: 02/13/2016  Primary Care Provider:  Einar Pheasant, MD Primary Cardiologist:  Johnny Bridge, MD   Chief Complaint    83 y/o ? with a h/o HTN and osteoporosis, who was recently admitted with dyspnea and acute systolic chf/NICM.  Past Medical History    Past Medical History:  Diagnosis Date  . Chronic systolic CHF (congestive heart failure) ([place])    a. 01/2015 Echo: EF 25-30%, sev diff HK, mild to mod MR, mildly dil LA, nl RV fxn, PASP 61 mmHg.  Marland Kitchen Hypercholesterolemia   . Hypertensive heart disease with CHF (congestive heart failure) ([place])   . NICM (nonischemic cardiomyopathy) ([place])    a. 01/2015 Echo: EF 25-30% sev diff HK;  b. 01/2015 Lexi MV: EF 19%, small defect of mild severity in apex - likely breast attenuation, no ischemia.  . Osteoporosis    Past Surgical History:  Procedure Laterality Date  . ABDOMINAL HYSTERECTOMY    . THYROIDECTOMY, PARTIAL  1956  . TUBAL LIGATION      Allergies  No Known Allergies  History of Present Illness    83 y/o ? with a h/o HTN.  She was recently admitted to [facility] following a several wk h/o progressive dyspnea.  She was found to have CHF and new LV dysfxn, with an EF of 25-30% by echo.  She ruled out.  Following diuresis, myoview was performed and was non-ischemic.  She was maintained on toprol, entresto, spiro, and po lasix.  She was d/c'd and since d/c she has done exceptionally well.  She has been weighing herself daily and wt has been 124 to 125 lbs.  She denies chest pain, palpitations, dyspnea, pnd, orthopnea, n, v, dizziness, syncope, edema, weight gain, or early satiety. In reviewing her med list, I noted that she was sent home on entresto once daily and also told to resume micardis, which she had been on prior to hospitalization.  She has been taking both as advised by the discharging MD.  Home Medications    Prior to Admission medications   Medication Sig Start Date End  Date Taking? Authorizing Provider  albuterol (PROVENTIL HFA;VENTOLIN HFA) 108 (90 Base) MCG/ACT inhaler Inhale 2 puffs into the lungs every 6 (six) hours as needed for wheezing or shortness of breath. 12/04/15  Yes Einar Pheasant, MD  aspirin (ASPIRIN EC) 81 MG EC tablet Take 81 mg by mouth daily. Swallow whole.   Yes Historical Provider, MD  Cholecalciferol (VITAMIN D-3) 1000 UNITS CAPS Take 2,000 Units by mouth daily.   Yes Historical Provider, MD  furosemide (LASIX) 40 MG tablet Take 1 tablet (40 mg total) by mouth daily. 02/03/16  Yes Vipul Manuella Ghazi, MD  meloxicam (MOBIC) 15 MG tablet Take 15 mg by mouth daily.   Yes Historical Provider, MD  metoprolol succinate (TOPROL XL) 25 MG 24 hr tablet Take 1 tablet (25 mg total) by mouth daily. 02/03/16  Yes Vipul Manuella Ghazi, MD  potassium chloride SA (K-DUR,KLOR-CON) 20 MEQ tablet Take 1 tablet (20 mEq total) by mouth daily. 02/03/16  Yes Vipul Manuella Ghazi, MD  sacubitril-valsartan (ENTRESTO) 24-26 MG Take 1 tablet by mouth daily. 02/03/16  Yes Vipul Manuella Ghazi, MD  spironolactone (ALDACTONE) 25 MG tablet Take 0.5 tablets (12.5 mg total) by mouth daily. 02/03/16  Yes Vipul Manuella Ghazi, MD  telmisartan (MICARDIS) 80 MG tablet Take 1 tablet (80 mg total) by mouth daily. 08/29/15  Yes Einar Pheasant, MD    Review of  Systems    Overall doing well.  She denies chest pain, palpitations, dyspnea, pnd, orthopnea, n, v, dizziness, syncope, edema, weight gain, or early satiety.  All other systems reviewed and are otherwise negative except as noted above.  Physical Exam    VS:  BP 130/68 (BP Location: Left Arm, Patient Position: Sitting, Cuff Size: Normal)   Pulse 60   Ht 5\' 4"  (1.626 m)   Wt 125 lb (56.7 kg)   LMP 02/21/1966   BMI 21.46 kg/m  , BMI Body mass index is 21.46 kg/m. GEN: Well nourished, well developed, in no acute distress.  HEENT: normal.  Neck: Supple, no JVD, carotid bruits, or masses. Cardiac: RRR, no murmurs, rubs, or gallops. No clubbing, cyanosis, edema.  Radials/DP/PT 2+  and equal bilaterally.  Respiratory:  Respirations regular and unlabored, diminished breath sounds bilat. GI: Soft, nontender, nondistended, BS + x 4. MS: no deformity or atrophy. Skin: warm and dry, no rash. Neuro:  Strength and sensation are intact. Psych: Normal affect.  Accessory Clinical Findings    ECG - RSR, 60, leftward axis, LVH w/ twi I, aVL, V4-V6.  Assessment & Plan    1.  Chronic systolic CHF/NICM:  Pt was recently admitted with dyspnea/CHF and found to have LV dysfxn (EF 25-30%).  She diuresed down to 126 lbs.  Myoview was non-ischemic.  She has done well since d/c and wt has been stable.  She was d/c'd on toprol xl, entresto, spiro, and lasix.  [name] was only Rx once daily.  She was also told to resume micardis.  We discussed this today.  I will d/c micardis and have asked her to take entresto 24-26 mg BID.  K was 5.0 on the day of d/c and KCl was discontinued but she was then sent home on it.   I will also f/u a bmet to assess renal fxn and lytes.  We can f/u echo in ~ 40 days, though given advanced age, she is not a candidate for ICD therapy. If BP stable on future visit, we could consider titrating entresto further.  2.  Hypertensive Heart Dzs w/ CHF:  BP stable today.  D/c micardis and changing entresto to BID as it is supposed to be.  3.  Hypoxia:  Sats dropped into 80's during admission.  She was sent home w/ O2 but has had nl pulse ox's the past few days.  She has not been using O2.  4.  PAT/NSVT:  Both noted during admission but were quiescent on  blocker.  ECG ok today.  No palpitations/presyncope @ home.  5.  Dispo:  F/u bmet today.  F/u in clinic in 3-4 wks.  Murray Hodgkins, NP 02/13/2016, 4:09 PM

## 2016-02-14 LAB — BASIC METABOLIC PANEL
BUN / CREAT RATIO: 20 (ref 12–28)
BUN: 20 mg/dL (ref 8–27)
CHLORIDE: 97 mmol/L (ref 96–106)
CO2: 29 mmol/L (ref 18–29)
Calcium: 9.5 mg/dL (ref 8.7–10.3)
Creatinine, Ser: 1.02 mg/dL — ABNORMAL HIGH (ref 0.57–1.00)
GFR calc non Af Amer: 50 mL/min/{1.73_m2} — ABNORMAL LOW (ref >59)
GFR, EST AFRICAN AMERICAN: 58 mL/min/{1.73_m2} — AB (ref >59)
Glucose: 94 mg/dL (ref 65–99)
POTASSIUM: 4.5 mmol/L (ref 3.5–5.2)
SODIUM: 142 mmol/L (ref 134–144)

## 2016-02-16 ENCOUNTER — Other Ambulatory Visit: Payer: Self-pay | Admitting: *Deleted

## 2016-02-16 ENCOUNTER — Ambulatory Visit: Payer: Medicare HMO | Attending: Family | Admitting: Family

## 2016-02-16 ENCOUNTER — Telehealth: Payer: Self-pay | Admitting: *Deleted

## 2016-02-16 ENCOUNTER — Encounter: Payer: Self-pay | Admitting: Family

## 2016-02-16 ENCOUNTER — Telehealth: Payer: Self-pay | Admitting: Family

## 2016-02-16 VITALS — BP 96/48 | HR 75 | Resp 18 | Ht 64.0 in | Wt 128.1 lb

## 2016-02-16 DIAGNOSIS — Z7982 Long term (current) use of aspirin: Secondary | ICD-10-CM | POA: Insufficient documentation

## 2016-02-16 DIAGNOSIS — E875 Hyperkalemia: Secondary | ICD-10-CM | POA: Insufficient documentation

## 2016-02-16 DIAGNOSIS — Z9889 Other specified postprocedural states: Secondary | ICD-10-CM | POA: Insufficient documentation

## 2016-02-16 DIAGNOSIS — Z79899 Other long term (current) drug therapy: Secondary | ICD-10-CM | POA: Insufficient documentation

## 2016-02-16 DIAGNOSIS — I5022 Chronic systolic (congestive) heart failure: Secondary | ICD-10-CM | POA: Diagnosis not present

## 2016-02-16 DIAGNOSIS — I952 Hypotension due to drugs: Secondary | ICD-10-CM

## 2016-02-16 DIAGNOSIS — I959 Hypotension, unspecified: Secondary | ICD-10-CM | POA: Insufficient documentation

## 2016-02-16 DIAGNOSIS — Z90710 Acquired absence of both cervix and uterus: Secondary | ICD-10-CM | POA: Diagnosis not present

## 2016-02-16 DIAGNOSIS — Z9851 Tubal ligation status: Secondary | ICD-10-CM | POA: Diagnosis not present

## 2016-02-16 DIAGNOSIS — Z5189 Encounter for other specified aftercare: Secondary | ICD-10-CM | POA: Diagnosis not present

## 2016-02-16 NOTE — Progress Notes (Signed)
[name], female    DOB: [birth year], 83 y.o.   MRN: [id number]  HPI  [name] is a 83 y/o female with a history of HTN, asthma, NSVT, hyperlipidemia, osteoporosis, hyperkalemia and chronic heart failure.  Last echo was done 01/29/16 and showed an EF of 35-30% along with mild/mod MR and mod/severely elevated PA pressure of 61 mm Hg.  Was last admitted on 01/28/16 with new onset heart failure. IV diuresed for 5.4 L and cardiology consult was obtained.   She presents today for her initial visit without any fatigue or shortness of breath. Denies any swelling in her legs or abdomen. Already weighing herself daily and says that her weight has been stable. Has recently seen cardiology and had some medications clarified. Does have some mild right knee pain that she would occasionally take meloxicam for.   Past Medical History:  Diagnosis Date  . Chronic systolic CHF (congestive heart failure) ([place])    a. 01/2015 Echo: EF 25-30%, sev diff HK, mild to mod MR, mildly dil LA, nl RV fxn, PASP 61 mmHg.  Marland Kitchen Hypercholesterolemia   . Hypertensive heart disease with CHF (congestive heart failure) ([place])   . NICM (nonischemic cardiomyopathy) ([place])    a. 01/2015 Echo: EF 25-30% sev diff HK;  b. 01/2015 Lexi MV: EF 19%, small defect of mild severity in apex - likely breast attenuation, no ischemia.  . Osteoporosis    Past Surgical History:  Procedure Laterality Date  . ABDOMINAL HYSTERECTOMY    . THYROIDECTOMY, PARTIAL  1956  . TUBAL LIGATION     Family History  Problem Relation Age of Onset  . Stroke Mother   . Colon cancer Father   . Breast cancer Daughter 78  . Breast cancer Cousin    Social History  Substance Use Topics  . Smoking status: Never Smoker  . Smokeless tobacco: Never Used  . Alcohol use No   No Known Allergies  Prior to Admission medications   Medication Sig Start Date End Date Taking? Authorizing Provider  albuterol (PROVENTIL HFA;VENTOLIN HFA) 108 (90 Base)  MCG/ACT inhaler Inhale 2 puffs into the lungs every 6 (six) hours as needed for wheezing or shortness of breath. 12/04/15  Yes Einar Pheasant, MD  aspirin (ASPIRIN EC) 81 MG EC tablet Take 81 mg by mouth daily. Swallow whole.   Yes Historical Provider, MD  Cholecalciferol (VITAMIN D-3) 1000 UNITS CAPS Take 2,000 Units by mouth daily.   Yes Historical Provider, MD  furosemide (LASIX) 40 MG tablet Take 1 tablet (40 mg total) by mouth daily. 02/13/16  Yes Minna Merritts, MD  meloxicam (MOBIC) 15 MG tablet Take 15 mg by mouth daily PRN   Yes Historical Provider, MD  metoprolol succinate (TOPROL XL) 25 MG 24 hr tablet Take 1 tablet (25 mg total) by mouth daily. 02/13/16  Yes Minna Merritts, MD  potassium chloride SA (K-DUR,KLOR-CON) 20 MEQ tablet Take 1 tablet (20 mEq total) by mouth daily. 02/13/16  Yes Minna Merritts, MD  sacubitril-valsartan (ENTRESTO) 24-26 MG Take 1 tablet by mouth 2 (two) times daily. 02/13/16  Yes Minna Merritts, MD  spironolactone (ALDACTONE) 25 MG tablet Take 0.5 tablets (12.5 mg total) by mouth daily. 02/13/16  Yes Minna Merritts, MD     Review of Systems  Constitutional: Negative for appetite change and fatigue.  HENT: Negative for congestion, postnasal drip and sore throat.   Eyes: Negative.   Respiratory: Negative for chest tightness and shortness of breath.  Cardiovascular: Negative for chest pain, palpitations and leg swelling.  Gastrointestinal: Negative for abdominal distention and abdominal pain.  Endocrine: Negative.   Genitourinary: Negative.   Musculoskeletal: Positive for arthralgias (right knee aches). Negative for back pain.  Skin: Negative.   Allergic/Immunologic: Negative.   Neurological: Negative for dizziness and light-headedness.  Hematological: Negative for adenopathy. Does not bruise/bleed easily.  Psychiatric/Behavioral: Negative for dysphoric mood and sleep disturbance (sleeping on 1 pillow). The patient is not nervous/anxious.    Vitals:    02/16/16 0932  BP: (!) 96/48  Pulse: 75  Resp: 18  SpO2: 97%  Weight: 128 lb 2 oz (58.1 kg)  Height: 5\' 4"  (1.626 m)   Wt Readings from Last 3 Encounters:  02/16/16 128 lb 2 oz (58.1 kg)  02/13/16 125 lb (56.7 kg)  02/03/16 126 lb 14.4 oz (57.6 kg)   Lab Results  Component Value Date   CREATININE 1.02 (H) 02/13/2016   CREATININE 0.81 02/03/2016   CREATININE 0.87 02/02/2016    Physical Exam  Constitutional: She is oriented to person, place, and time. She appears well-developed and well-nourished.  HENT:  Head: Normocephalic and atraumatic.  Eyes: Conjunctivae are normal. Pupils are equal, round, and reactive to light.  Neck: Normal range of motion. Neck supple. No JVD present.  Cardiovascular: Normal rate and regular rhythm.   Pulmonary/Chest: Effort normal. She has no wheezes. She has no rales.  Abdominal: Soft. She exhibits no distension. There is no tenderness.  Musculoskeletal: She exhibits no edema or tenderness.  Neurological: She is alert and oriented to person, place, and time.  Skin: Skin is warm and dry.  Psychiatric: She has a normal mood and affect. Her behavior is normal. Thought content normal.  Nursing note and vitals reviewed.  Assessment & Plan:  1: Chronic heart failure with reduced ejection fraction- - NYHA class I - euvolemic - already weighing daily. Instructed to call for an overnight weight gain of >2 pounds or a weekly weight gain of >5 pounds - no longer adding salt to her food. Reviewed the importance of following a 2000mg  sodium diet and how to read food labels was reviewed. Written dietary information was also provided to her. She does like to occasionally go out to eat and we discussed how to be careful with her sodium intake the other meals leading up to her going out to eat so that she could enjoy her meal.  - taking entresto BID now along with 12.5mg  spironolactone  - referral made to cardiac rehab - PharmD went in and reviewed medications  with the patient - returns to cardiology 03/15/16  2: Hypotension- - BP on the low side but she's without any dizziness - sees PCP Nicki Reaper) on 02/26/16  3: Hyperkalemia- - discharge potassium was 5.0 and potassium level on 02/13/16 was 4.5. She is taking entresto BID, 12.5mg  spironolactone and potassium 85meq daily - have left a message with cardiologist about whether she needs to continue potassium tablets. Was going to d/c it but cardiology had just refilled it at her office visit on 02/13/16. Will call patient after I hear from cardiology   She also sees Dr. Raul Del on 02/24/16  Return here in 6 weeks or sooner for any questions/problems before then.

## 2016-02-16 NOTE — Telephone Encounter (Signed)
Spoke with patient and reviewed lab results and recommendations. She was at appointment earlier and said that they reviewed with her but then she had another appointment and couldn't remember if she needed to call back or not so she just wanted to make sure. Reviewed again with her that this information was the same and she was so appreciative for my call back. Let her know to call back if she has any further questions and she verbalized understanding.

## 2016-02-16 NOTE — Patient Instructions (Addendum)
Continue weighing daily and call for an overnight weight gain of > 2 pounds or a weekly weight gain of >5 pounds.  For assistance with your medication visit, [email or web address]

## 2016-02-16 NOTE — Telephone Encounter (Signed)
After hearing from cardiology, will d/c potassium tablets and continue other medications at this time. Patient verbalized understanding and says that she will stop taking her potassium tablets today.

## 2016-02-16 NOTE — Telephone Encounter (Signed)
-----   Message from Alisa Graff, [place] sent at 02/16/2016  9:32 AM EST ----- Will do, thanks!  Darylene Price  ----- Message ----- From: Valora Corporal, RN Sent: 02/16/2016   8:52 AM To: Alisa Graff, FNP  Pt is seeing you this morning and wanted to see if you could pass this on to the patient. I called and left the patient a voicemail. Please let me know if you have any questions.  Thanks, Lesleigh Noe    ----- Message ----- From: Rogelia Mire, NP Sent: 02/14/2016   7:55 AM To: Rebeca Alert [place] Triage  Renal fxn stable.  K is 4.5.  Given that she is on entresto and spironolactone, please have her discontinue supplemental KCl (currently taking 20 meq daily).

## 2016-02-18 DIAGNOSIS — Z9981 Dependence on supplemental oxygen: Secondary | ICD-10-CM | POA: Diagnosis not present

## 2016-02-18 DIAGNOSIS — J209 Acute bronchitis, unspecified: Secondary | ICD-10-CM | POA: Diagnosis not present

## 2016-02-18 DIAGNOSIS — I5022 Chronic systolic (congestive) heart failure: Secondary | ICD-10-CM | POA: Diagnosis not present

## 2016-02-18 DIAGNOSIS — I10 Essential (primary) hypertension: Secondary | ICD-10-CM | POA: Diagnosis not present

## 2016-02-18 DIAGNOSIS — M810 Age-related osteoporosis without current pathological fracture: Secondary | ICD-10-CM | POA: Diagnosis not present

## 2016-02-18 DIAGNOSIS — I472 Ventricular tachycardia: Secondary | ICD-10-CM | POA: Diagnosis not present

## 2016-02-18 DIAGNOSIS — J440 Chronic obstructive pulmonary disease with acute lower respiratory infection: Secondary | ICD-10-CM | POA: Diagnosis not present

## 2016-02-18 DIAGNOSIS — Z7982 Long term (current) use of aspirin: Secondary | ICD-10-CM | POA: Diagnosis not present

## 2016-02-18 DIAGNOSIS — Z791 Long term (current) use of non-steroidal anti-inflammatories (NSAID): Secondary | ICD-10-CM | POA: Diagnosis not present

## 2016-02-18 DIAGNOSIS — E7800 Pure hypercholesterolemia, unspecified: Secondary | ICD-10-CM | POA: Diagnosis not present

## 2016-02-24 DIAGNOSIS — I429 Cardiomyopathy, unspecified: Secondary | ICD-10-CM | POA: Diagnosis not present

## 2016-02-24 DIAGNOSIS — J449 Chronic obstructive pulmonary disease, unspecified: Secondary | ICD-10-CM | POA: Diagnosis not present

## 2016-02-24 DIAGNOSIS — R0602 Shortness of breath: Secondary | ICD-10-CM | POA: Diagnosis not present

## 2016-02-26 ENCOUNTER — Ambulatory Visit (INDEPENDENT_AMBULATORY_CARE_PROVIDER_SITE_OTHER): Payer: Medicare HMO | Admitting: Internal Medicine

## 2016-02-26 ENCOUNTER — Encounter: Payer: Self-pay | Admitting: Internal Medicine

## 2016-02-26 VITALS — BP 126/64 | HR 64 | Temp 98.6°F | Ht 64.0 in | Wt 131.4 lb

## 2016-02-26 DIAGNOSIS — I110 Hypertensive heart disease with heart failure: Secondary | ICD-10-CM

## 2016-02-26 DIAGNOSIS — E7800 Pure hypercholesterolemia, unspecified: Secondary | ICD-10-CM

## 2016-02-26 DIAGNOSIS — I5022 Chronic systolic (congestive) heart failure: Secondary | ICD-10-CM | POA: Diagnosis not present

## 2016-02-26 DIAGNOSIS — R7989 Other specified abnormal findings of blood chemistry: Secondary | ICD-10-CM

## 2016-02-26 DIAGNOSIS — Z1231 Encounter for screening mammogram for malignant neoplasm of breast: Secondary | ICD-10-CM

## 2016-02-26 DIAGNOSIS — I10 Essential (primary) hypertension: Secondary | ICD-10-CM | POA: Diagnosis not present

## 2016-02-26 DIAGNOSIS — R945 Abnormal results of liver function studies: Secondary | ICD-10-CM

## 2016-02-26 DIAGNOSIS — C189 Malignant neoplasm of colon, unspecified: Secondary | ICD-10-CM

## 2016-02-26 DIAGNOSIS — Z1239 Encounter for other screening for malignant neoplasm of breast: Secondary | ICD-10-CM

## 2016-02-26 LAB — BASIC METABOLIC PANEL
BUN: 21 mg/dL (ref 6–23)
CO2: 37 meq/L — AB (ref 19–32)
Calcium: 9.4 mg/dL (ref 8.4–10.5)
Chloride: 99 mEq/L (ref 96–112)
Creatinine, Ser: 0.88 mg/dL (ref 0.40–1.20)
GFR: 78.36 mL/min (ref >60.00)
Glucose, Bld: 87 mg/dL (ref 70–99)
Potassium: 4.5 mEq/L (ref 3.5–5.1)
SODIUM: 139 meq/L (ref 135–145)

## 2016-02-26 NOTE — Progress Notes (Signed)
[name], female   DOB: 1928-05-22, 83 y.o.   MRN: [id number]   Subjective:    [name], female    DOB: 01/03/1929, 83 y.o.   MRN: [id number]  HPI  Patient here for hospital follow up.  She was admitted 01/28/16 and discharged 02/03/16.  Was admitted with sob and  Diagnosed with CHF and acute respiratory failure with hypercapnia.  Had ECHO which revealed EF of 25-30%, mild to moderate MR and PASP 49mmHg.  She was diuresed.  myoview 02/02/16 - no ischemia.  Seeing cardiology.  Evaluated 02/13/16.  She is on entresto, spironolactone, toprol and lasix.  Breathing is better.  No increased cough or congestion.  Eating.  No nausea or vomiting.  Not needing O2.  She saw Dr Raul Del 02/24/16.  He impression - moderate to severe COPD and cxr suggestive of interstitial lung disease.  Recommended continuing albuterol.  Overall she feels better.    Past Medical History:  Diagnosis Date  . Asthma   . Chronic systolic CHF (congestive heart failure) ([place])    a. 01/2015 Echo: EF 25-30%, sev diff HK, mild to mod MR, mildly dil LA, nl RV fxn, PASP 61 mmHg.  Marland Kitchen Hypercholesterolemia   . Hypertension   . Hypertensive heart disease with CHF (congestive heart failure) ([place])   . NICM (nonischemic cardiomyopathy) ([place])    a. 01/2015 Echo: EF 25-30% sev diff HK;  b. 01/2015 Lexi MV: EF 19%, small defect of mild severity in apex - likely breast attenuation, no ischemia.  Marland Kitchen NSVT (nonsustained ventricular tachycardia) ([place])   . Osteoporosis    Past Surgical History:  Procedure Laterality Date  . ABDOMINAL HYSTERECTOMY    . THYROIDECTOMY, PARTIAL  1956  . TUBAL LIGATION     Family History  Problem Relation Age of Onset  . Stroke Mother   . Colon cancer Father   . Breast cancer Daughter 61  . Breast cancer Cousin    Social History   Social History  . Marital status: Widowed    Spouse name: N/A  . Number of children: N/A  . Years of education: N/A   Social History Main Topics  .  Smoking status: Never Smoker  . Smokeless tobacco: Never Used  . Alcohol use No  . Drug use: No  . Sexual activity: Not Asked   Other Topics Concern  . None   Social History Narrative  . None    Outpatient Encounter Prescriptions as of 02/26/2016  Medication Sig  . albuterol (PROVENTIL HFA;VENTOLIN HFA) 108 (90 Base) MCG/ACT inhaler Inhale 2 puffs into the lungs every 6 (six) hours as needed for wheezing or shortness of breath.  Marland Kitchen aspirin (ASPIRIN EC) 81 MG EC tablet Take 81 mg by mouth daily. Swallow whole.  . Cholecalciferol (VITAMIN D-3) 1000 UNITS CAPS Take 2,000 Units by mouth daily.  . furosemide (LASIX) 40 MG tablet Take 1 tablet (40 mg total) by mouth daily.  . meloxicam (MOBIC) 15 MG tablet Take 15 mg by mouth daily.  . metoprolol succinate (TOPROL XL) 25 MG 24 hr tablet Take 1 tablet (25 mg total) by mouth daily.  . sacubitril-valsartan (ENTRESTO) 24-26 MG Take 1 tablet by mouth 2 (two) times daily.  Marland Kitchen spironolactone (ALDACTONE) 25 MG tablet Take 0.5 tablets (12.5 mg total) by mouth daily.   No facility-administered encounter medications on file as of 02/26/2016.     Review of Systems  Constitutional: Negative for appetite change and fever.  HENT: Negative for  congestion and sinus pressure.   Respiratory: Negative for cough and chest tightness.        Breathing better.    Cardiovascular: Negative for chest pain and palpitations.       No increased leg swelling.   Gastrointestinal: Negative for abdominal pain, diarrhea, nausea and vomiting.  Genitourinary: Negative for difficulty urinating and dysuria.  Musculoskeletal: Negative for back pain and joint swelling.  Skin: Negative for color change and rash.  Neurological: Negative for dizziness, light-headedness and headaches.  Psychiatric/Behavioral: Negative for agitation and dysphoric mood.       Objective:    Physical Exam  Constitutional: She appears well-developed and well-nourished. No distress.  HENT:  Nose:  Nose normal.  Mouth/Throat: Oropharynx is clear and moist.  Neck: Neck supple. No thyromegaly present.  Cardiovascular: Normal rate and regular rhythm.   Pulmonary/Chest: Breath sounds normal. No respiratory distress. She has no wheezes.  Abdominal: Soft. Bowel sounds are normal. There is no tenderness.  Musculoskeletal: She exhibits no tenderness.  No increased lower extremity swelling.   Lymphadenopathy:    She has no cervical adenopathy.  Skin: No rash noted. No erythema.  Psychiatric: She has a normal mood and affect. Her behavior is normal.    BP 126/64 (BP Location: Left Arm, Patient Position: Sitting, Cuff Size: Large)   Pulse 64   Temp 98.6 F (37 C) (Oral)   Ht 5\' 4"  (1.626 m)   Wt 131 lb 6.4 oz (59.6 kg)   LMP 02/21/1966   SpO2 94%   BMI 22.55 kg/m  Wt Readings from Last 3 Encounters:  02/26/16 131 lb 6.4 oz (59.6 kg)  02/16/16 128 lb 2 oz (58.1 kg)  02/13/16 125 lb (56.7 kg)     Lab Results  Component Value Date   WBC 3.1 (L) 02/03/2016   HGB 13.8 02/03/2016   HCT 40.8 02/03/2016   PLT 176 02/03/2016   GLUCOSE 87 02/26/2016   CHOL 228 (H) 12/01/2015   TRIG 69.0 12/01/2015   HDL 69.80 12/01/2015   LDLDIRECT 128.3 02/08/2013   LDLCALC 144 (H) 12/01/2015   ALT 33 12/31/2015   AST 25 12/31/2015   NA 139 02/26/2016   K 4.5 02/26/2016   CL 99 02/26/2016   CREATININE 0.88 02/26/2016   BUN 21 02/26/2016   CO2 37 (H) 02/26/2016   TSH 0.588 01/29/2016   INR 1.0 12/09/2015   HGBA1C 6.1 10/10/2013    Dg Chest 1 View  Result Date: 01/29/2016 CLINICAL DATA:  Shortness of breath with exertion and peripheral edema which began about 10 days ago EXAM: CHEST 1 VIEW COMPARISON:  PA and lateral chest x-ray of January 28, 2016. FINDINGS: The lungs remain hyperinflated. The interstitial markings are more conspicuous bilaterally. Patchy confluent density in the inferior aspect of the right upper lobe may be developing. There is no pleural effusion. The cardiac silhouette is  enlarged. The pulmonary vascularity is engorged. There is tortuosity of the descending thoracic aorta. The trachea is midline. There is calcification in the wall of the aortic arch. The bony thorax exhibits no acute abnormality. IMPRESSION: Pulmonary interstitial edema. Early alveolar edema versus pneumonia in the inferior aspect of the right upper lobe. Underlying COPD. Thoracic aortic atherosclerosis. Electronically Signed   By: David  Martinique M.D.   On: 01/29/2016 09:21       Assessment & Plan:   Problem List Items Addressed This Visit    Abnormal liver function tests    Liver panel 12/31/15 - wnl.  Chronic systolic heart failure (HCC) (Chronic)    Recently admitted as outlined.  EF 25-30%.  Seeing cardiology.  Doing well on current regimen.  Follow.        Relevant Orders   Basic metabolic panel (Completed)   Colon adenocarcinoma ([place])    Colonoscopy 05/2012 as outlined in overview.  CEA 12/01/15 - 1.5.  Follow.        Hypercholesterolemia    Low cholesterol diet and exercise.  Follow lipid panel.        Hypertension    Blood pressure is under good control on current regimen.  Follow pressures.  Check metabolic panel.        Hypertensive heart disease with CHF (congestive heart failure) ([place])    Recently admitted as outlined.  Diuresed.  ECHO with EF 25-30%.  On entresto, spironolactone, lasix and toprol.  Breathing is better.  Denies any increased sob.  Weight is up some today.  Different scale.  She was 129 pounds when saw pulmonary 02/24/16.  She is weighing herself regularly.  Have asked her to weight on her scales.  If increased, contact us or cardiology.  Overall feels better and doing better.         Other Visit Diagnoses    Screening for breast cancer    -  Primary   Relevant Orders   MM Digital Screening       Einar Pheasant, MD

## 2016-02-26 NOTE — Progress Notes (Signed)
Pre-visit discussion using our clinic review tool. No additional management support is needed unless otherwise documented below in the visit note.  

## 2016-02-27 DIAGNOSIS — Z791 Long term (current) use of non-steroidal anti-inflammatories (NSAID): Secondary | ICD-10-CM | POA: Diagnosis not present

## 2016-02-27 DIAGNOSIS — J209 Acute bronchitis, unspecified: Secondary | ICD-10-CM | POA: Diagnosis not present

## 2016-02-27 DIAGNOSIS — E7800 Pure hypercholesterolemia, unspecified: Secondary | ICD-10-CM | POA: Diagnosis not present

## 2016-02-27 DIAGNOSIS — Z9981 Dependence on supplemental oxygen: Secondary | ICD-10-CM | POA: Diagnosis not present

## 2016-02-27 DIAGNOSIS — I472 Ventricular tachycardia: Secondary | ICD-10-CM | POA: Diagnosis not present

## 2016-02-27 DIAGNOSIS — Z7982 Long term (current) use of aspirin: Secondary | ICD-10-CM | POA: Diagnosis not present

## 2016-02-27 DIAGNOSIS — M810 Age-related osteoporosis without current pathological fracture: Secondary | ICD-10-CM | POA: Diagnosis not present

## 2016-02-27 DIAGNOSIS — I5022 Chronic systolic (congestive) heart failure: Secondary | ICD-10-CM | POA: Diagnosis not present

## 2016-02-27 DIAGNOSIS — I10 Essential (primary) hypertension: Secondary | ICD-10-CM | POA: Diagnosis not present

## 2016-02-27 DIAGNOSIS — J440 Chronic obstructive pulmonary disease with acute lower respiratory infection: Secondary | ICD-10-CM | POA: Diagnosis not present

## 2016-02-29 ENCOUNTER — Encounter: Payer: Self-pay | Admitting: Internal Medicine

## 2016-02-29 NOTE — Assessment & Plan Note (Signed)
Recently admitted as outlined.  Diuresed.  ECHO with EF 25-30%.  On entresto, spironolactone, lasix and toprol.  Breathing is better.  Denies any increased sob.  Weight is up some today.  Different scale.  She was 129 pounds when saw pulmonary 02/24/16.  She is weighing herself regularly.  Have asked her to weight on her scales.  If increased, contact us or cardiology.  Overall feels better and doing better.

## 2016-02-29 NOTE — Assessment & Plan Note (Signed)
Colonoscopy 05/2012 as outlined in overview.  CEA 12/01/15 - 1.5.  Follow.

## 2016-02-29 NOTE — Assessment & Plan Note (Signed)
Liver panel 12/31/15 - wnl.

## 2016-02-29 NOTE — Assessment & Plan Note (Signed)
Blood pressure is under good control on current regimen.  Follow pressures.  Check metabolic panel.

## 2016-02-29 NOTE — Assessment & Plan Note (Signed)
Low cholesterol diet and exercise.  Follow lipid panel.   

## 2016-02-29 NOTE — Assessment & Plan Note (Signed)
Recently admitted as outlined.  EF 25-30%.  Seeing cardiology.  Doing well on current regimen.  Follow.

## 2016-03-04 ENCOUNTER — Other Ambulatory Visit: Payer: Self-pay | Admitting: *Deleted

## 2016-03-04 DIAGNOSIS — Z7982 Long term (current) use of aspirin: Secondary | ICD-10-CM | POA: Diagnosis not present

## 2016-03-04 DIAGNOSIS — E7800 Pure hypercholesterolemia, unspecified: Secondary | ICD-10-CM | POA: Diagnosis not present

## 2016-03-04 DIAGNOSIS — Z9981 Dependence on supplemental oxygen: Secondary | ICD-10-CM | POA: Diagnosis not present

## 2016-03-04 DIAGNOSIS — Z791 Long term (current) use of non-steroidal anti-inflammatories (NSAID): Secondary | ICD-10-CM | POA: Diagnosis not present

## 2016-03-04 DIAGNOSIS — I5022 Chronic systolic (congestive) heart failure: Secondary | ICD-10-CM | POA: Diagnosis not present

## 2016-03-04 DIAGNOSIS — I472 Ventricular tachycardia: Secondary | ICD-10-CM | POA: Diagnosis not present

## 2016-03-04 DIAGNOSIS — J440 Chronic obstructive pulmonary disease with acute lower respiratory infection: Secondary | ICD-10-CM | POA: Diagnosis not present

## 2016-03-04 DIAGNOSIS — M810 Age-related osteoporosis without current pathological fracture: Secondary | ICD-10-CM | POA: Diagnosis not present

## 2016-03-04 DIAGNOSIS — J209 Acute bronchitis, unspecified: Secondary | ICD-10-CM | POA: Diagnosis not present

## 2016-03-04 DIAGNOSIS — I10 Essential (primary) hypertension: Secondary | ICD-10-CM | POA: Diagnosis not present

## 2016-03-05 ENCOUNTER — Telehealth: Payer: Self-pay | Admitting: Radiology

## 2016-03-05 NOTE — Telephone Encounter (Signed)
Pt notified of lab results and verbalized understanding.

## 2016-03-11 ENCOUNTER — Other Ambulatory Visit: Payer: Self-pay

## 2016-03-11 DIAGNOSIS — N6320 Unspecified lump in the left breast, unspecified quadrant: Secondary | ICD-10-CM

## 2016-03-12 ENCOUNTER — Ambulatory Visit (INDEPENDENT_AMBULATORY_CARE_PROVIDER_SITE_OTHER): Payer: Medicare HMO | Admitting: Internal Medicine

## 2016-03-12 ENCOUNTER — Encounter: Payer: Self-pay | Admitting: Internal Medicine

## 2016-03-12 VITALS — BP 126/78 | HR 69 | Temp 98.8°F | Resp 16 | Ht 64.0 in | Wt 129.4 lb

## 2016-03-12 DIAGNOSIS — I10 Essential (primary) hypertension: Secondary | ICD-10-CM

## 2016-03-12 DIAGNOSIS — I110 Hypertensive heart disease with heart failure: Secondary | ICD-10-CM | POA: Diagnosis not present

## 2016-03-12 DIAGNOSIS — Z0000 Encounter for general adult medical examination without abnormal findings: Secondary | ICD-10-CM | POA: Diagnosis not present

## 2016-03-12 DIAGNOSIS — C189 Malignant neoplasm of colon, unspecified: Secondary | ICD-10-CM | POA: Diagnosis not present

## 2016-03-12 DIAGNOSIS — E7800 Pure hypercholesterolemia, unspecified: Secondary | ICD-10-CM | POA: Diagnosis not present

## 2016-03-12 NOTE — Progress Notes (Signed)
[name], female   DOB: 1929-01-17, 83 y.o.   MRN: [id number]   Subjective:    [name], female    DOB: 01-11-1929, 83 y.o.   MRN: [id number]  HPI  Patient here for her physical exam.  States she is doing better.  Breathing better.  No increased sob.  No chest pain. Eating.  Monitoring her weight.  No acid reflux.  No abdominal pain.  Bowels stable.  Still keep her great grandchildren.  States she enjoys this.  Does not feel is too much.     Past Medical History:  Diagnosis Date  . Asthma   . Chronic systolic CHF (congestive heart failure) ([place])    a. 01/2015 Echo: EF 25-30%, sev diff HK, mild to mod MR, mildly dil LA, nl RV fxn, PASP 61 mmHg.  Marland Kitchen Hypercholesterolemia   . Hypertension   . Hypertensive heart disease with CHF (congestive heart failure) ([place])   . NICM (nonischemic cardiomyopathy) ([place])    a. 01/2015 Echo: EF 25-30% sev diff HK;  b. 01/2015 Lexi MV: EF 19%, small defect of mild severity in apex - likely breast attenuation, no ischemia.  Marland Kitchen NSVT (nonsustained ventricular tachycardia) ([place])   . Osteoporosis    Past Surgical History:  Procedure Laterality Date  . ABDOMINAL HYSTERECTOMY    . THYROIDECTOMY, PARTIAL  1956  . TUBAL LIGATION     Family History  Problem Relation Age of Onset  . Stroke Mother   . Colon cancer Father   . Breast cancer Daughter 53  . Breast cancer Cousin    Social History   Social History  . Marital status: Widowed    Spouse name: N/A  . Number of children: N/A  . Years of education: N/A   Social History Main Topics  . Smoking status: Never Smoker  . Smokeless tobacco: Never Used  . Alcohol use No  . Drug use: No  . Sexual activity: Not Asked   Other Topics Concern  . None   Social History Narrative  . None    Outpatient Encounter Prescriptions as of 03/12/2016  Medication Sig  . albuterol (PROVENTIL HFA;VENTOLIN HFA) 108 (90 Base) MCG/ACT inhaler Inhale 2 puffs into the lungs every 6 (six)  hours as needed for wheezing or shortness of breath.  Marland Kitchen aspirin (ASPIRIN EC) 81 MG EC tablet Take 81 mg by mouth daily. Swallow whole.  . Cholecalciferol (VITAMIN D-3) 1000 UNITS CAPS Take 2,000 Units by mouth daily.  . furosemide (LASIX) 40 MG tablet Take 1 tablet (40 mg total) by mouth daily.  . meloxicam (MOBIC) 15 MG tablet Take 15 mg by mouth daily.  . metoprolol succinate (TOPROL XL) 25 MG 24 hr tablet Take 1 tablet (25 mg total) by mouth daily.  . sacubitril-valsartan (ENTRESTO) 24-26 MG Take 1 tablet by mouth 2 (two) times daily.  Marland Kitchen spironolactone (ALDACTONE) 25 MG tablet Take 0.5 tablets (12.5 mg total) by mouth daily.   No facility-administered encounter medications on file as of 03/12/2016.     Review of Systems  Constitutional: Negative for appetite change and unexpected weight change.  HENT: Negative for congestion and sinus pressure.   Eyes: Negative for pain and visual disturbance.  Respiratory: Negative for cough, chest tightness and shortness of breath.   Cardiovascular: Negative for chest pain, palpitations and leg swelling.  Gastrointestinal: Negative for abdominal pain, diarrhea, nausea and vomiting.  Genitourinary: Negative for difficulty urinating and dysuria.  Musculoskeletal: Negative for back pain and joint  swelling.  Skin: Negative for color change and rash.  Neurological: Negative for dizziness, light-headedness and headaches.  Hematological: Negative for adenopathy. Does not bruise/bleed easily.  Psychiatric/Behavioral: Negative for agitation and dysphoric mood.       Objective:    Physical Exam  Constitutional: She is oriented to person, place, and time. She appears well-developed and well-nourished. No distress.  HENT:  Nose: Nose normal.  Mouth/Throat: Oropharynx is clear and moist.  Eyes: Right eye exhibits no discharge. Left eye exhibits no discharge. No scleral icterus.  Neck: Neck supple. No thyromegaly present.  Cardiovascular: Normal rate and  regular rhythm.   Pulmonary/Chest: Breath sounds normal. No accessory muscle usage. No tachypnea. No respiratory distress. She has no decreased breath sounds. She has no wheezes. She has no rhonchi. Right breast exhibits no inverted nipple, no mass, no nipple discharge and no tenderness (no axillary adenopathy). Left breast exhibits no inverted nipple, no mass, no nipple discharge and no tenderness (no axilarry adenopathy).  Abdominal: Soft. Bowel sounds are normal. There is no tenderness.  Musculoskeletal: She exhibits no edema or tenderness.  Lymphadenopathy:    She has no cervical adenopathy.  Neurological: She is alert and oriented to person, place, and time.  Skin: Skin is warm. No rash noted. No erythema.  Psychiatric: She has a normal mood and affect. Her behavior is normal.    BP 126/78 (BP Location: Right Arm, Patient Position: Sitting, Cuff Size: Large)   Pulse 69   Temp 98.8 F (37.1 C) (Oral)   Resp 16   Ht 5\' 4"  (1.626 m)   Wt 129 lb 6.4 oz (58.7 kg)   LMP 02/21/1966   SpO2 94%   BMI 22.21 kg/m  Wt Readings from Last 3 Encounters:  03/12/16 129 lb 6.4 oz (58.7 kg)  02/26/16 131 lb 6.4 oz (59.6 kg)  02/16/16 128 lb 2 oz (58.1 kg)     Lab Results  Component Value Date   WBC 3.1 (L) 02/03/2016   HGB 13.8 02/03/2016   HCT 40.8 02/03/2016   PLT 176 02/03/2016   GLUCOSE 87 02/26/2016   CHOL 228 (H) 12/01/2015   TRIG 69.0 12/01/2015   HDL 69.80 12/01/2015   LDLDIRECT 128.3 02/08/2013   LDLCALC 144 (H) 12/01/2015   ALT 33 12/31/2015   AST 25 12/31/2015   NA 139 02/26/2016   K 4.5 02/26/2016   CL 99 02/26/2016   CREATININE 0.88 02/26/2016   BUN 21 02/26/2016   CO2 37 (H) 02/26/2016   TSH 0.588 01/29/2016   INR 1.0 12/09/2015   HGBA1C 6.1 10/10/2013    Dg Chest 1 View  Result Date: 01/29/2016 CLINICAL DATA:  Shortness of breath with exertion and peripheral edema which began about 10 days ago EXAM: CHEST 1 VIEW COMPARISON:  PA and lateral chest x-ray of  January 28, 2016. FINDINGS: The lungs remain hyperinflated. The interstitial markings are more conspicuous bilaterally. Patchy confluent density in the inferior aspect of the right upper lobe may be developing. There is no pleural effusion. The cardiac silhouette is enlarged. The pulmonary vascularity is engorged. There is tortuosity of the descending thoracic aorta. The trachea is midline. There is calcification in the wall of the aortic arch. The bony thorax exhibits no acute abnormality. IMPRESSION: Pulmonary interstitial edema. Early alveolar edema versus pneumonia in the inferior aspect of the right upper lobe. Underlying COPD. Thoracic aortic atherosclerosis. Electronically Signed   By: David  Martinique M.D.   On: 01/29/2016 09:21  Assessment & Plan:   Problem List Items Addressed This Visit    Colon adenocarcinoma ([place])    Colonoscopy 05/2012.  CEA 12/01/15 - 1.5.  Follow.        Health care maintenance    Physical today 03/12/16.  S/p hysterectomy.  Seeing Dr Bary Castilla for her breast and f/u mammograms.  Mammogram scheduled.  Colonoscopy 06/08/12 - ok.        Hypercholesterolemia    Low cholesterol diet.  Follow lipid panel.        Hypertension    Blood pressure under good control.  Continue same medication regimen.  Follow pressures.  Follow metabolic panel.        Hypertensive heart disease with CHF (congestive heart failure) ([place])    Recently admitted as outlined in previous note.  Diuresed.  ECHO with EF 25-30%.  On entrestro, spironolactone, lasix and toprol.  Feels better.  Breathing better.  No sob.  Monitoring her weight.  Due to f/u with cardiology next week.  Recent metabolic panel wnl.            Einar Pheasant, MD

## 2016-03-12 NOTE — Progress Notes (Signed)
Pre-visit discussion using our clinic review tool. No additional management support is needed unless otherwise documented below in the visit note.  

## 2016-03-12 NOTE — Assessment & Plan Note (Signed)
Physical today 03/12/16.  S/p hysterectomy.  Seeing Dr Bary Castilla for her breast and f/u mammograms.  Mammogram scheduled.  Colonoscopy 06/08/12 - ok.

## 2016-03-14 ENCOUNTER — Encounter: Payer: Self-pay | Admitting: Internal Medicine

## 2016-03-14 NOTE — Assessment & Plan Note (Signed)
Recently admitted as outlined in previous note.  Diuresed.  ECHO with EF 25-30%.  On entrestro, spironolactone, lasix and toprol.  Feels better.  Breathing better.  No sob.  Monitoring her weight.  Due to f/u with cardiology next week.  Recent metabolic panel wnl.

## 2016-03-14 NOTE — Assessment & Plan Note (Signed)
Blood pressure under good control.  Continue same medication regimen.  Follow pressures.  Follow metabolic panel.   

## 2016-03-14 NOTE — Assessment & Plan Note (Signed)
Colonoscopy 05/2012.  CEA 12/01/15 - 1.5.  Follow.   

## 2016-03-14 NOTE — Assessment & Plan Note (Signed)
Low cholesterol diet.  Follow lipid panel.    

## 2016-03-15 ENCOUNTER — Encounter: Payer: Self-pay | Admitting: Cardiovascular Disease

## 2016-03-15 ENCOUNTER — Ambulatory Visit (INDEPENDENT_AMBULATORY_CARE_PROVIDER_SITE_OTHER): Payer: Medicare HMO | Admitting: Cardiovascular Disease

## 2016-03-15 VITALS — BP 120/82 | HR 60 | Ht 61.5 in | Wt 129.0 lb

## 2016-03-15 DIAGNOSIS — I10 Essential (primary) hypertension: Secondary | ICD-10-CM

## 2016-03-15 DIAGNOSIS — I5022 Chronic systolic (congestive) heart failure: Secondary | ICD-10-CM | POA: Diagnosis not present

## 2016-03-15 DIAGNOSIS — E7800 Pure hypercholesterolemia, unspecified: Secondary | ICD-10-CM

## 2016-03-15 NOTE — Patient Instructions (Signed)
Medication Instructions:   No medication changes made  If your weight goes up to 130 pounds or higher, Take additional lasix after lunch at 2 pm    Labwork:  No new labs needed  Testing/Procedures:  No further testing at this time   I recommend watching educational videos on topics of interest to you at:       [email or web address]  Enter code: HEARTCARE    Follow-Up: It was a pleasure seeing you in the office today. Please call us if you have new issues that need to be addressed before your next appt.  [phone]  Your physician wants you to follow-up in: 3 months.  You will receive a reminder letter in the mail two months in advance. If you don't receive a letter, please call our office to schedule the follow-up appointment.  If you need a refill on your cardiac medications before your next appointment, please call your pharmacy.

## 2016-03-15 NOTE — Progress Notes (Addendum)
Cardiology Office Note  Date:  03/15/2016   ID:  Kathy Dominguez, DOB 1928/04/03, MRN [id number]  PCP:  Einar Pheasant, MD   Chief Complaint  Patient presents with  . other    1 month follow up. Meds reviewed by the pt. verbally. "doing well."     HPI:  83 y/o ? with a h/o HTN,  admitted to [facility]/28/2016 for shortness of breath, diagnosed with acute systolic CHF ejection fraction 25-35%, myoview showing no ischemic, who presents for follow-up  of her chronic systolic CHF   In follow-up she reports that she feels well, minimal weight gain She monitors her blood pressure and weight at home Weight up from 125 pounds up to 127 pounds  Denies chest pain, palpitations, dyspnea, pnd, orthopnea, n, v, dizziness, syncope, edema, weight gain, or early satiety.  Lab work reviewed Potassium 4.5  Office weight up 4 pounds in the 129 pounds Denies leg swelling, abdominal bloating, shortness of breath when supine   PMH:   has a past medical history of Asthma; Chronic systolic CHF (congestive heart failure) ([place]); Hypercholesterolemia; Hypertension; Hypertensive heart disease with CHF (congestive heart failure) ([place]); NICM (nonischemic cardiomyopathy) ([place]); NSVT (nonsustained ventricular tachycardia) ([place]); and Osteoporosis.  PSH:    Past Surgical History:  Procedure Laterality Date  . ABDOMINAL HYSTERECTOMY    . THYROIDECTOMY, PARTIAL  1956  . TUBAL LIGATION      Current Outpatient Prescriptions  Medication Sig Dispense Refill  . albuterol (PROVENTIL HFA;VENTOLIN HFA) 108 (90 Base) MCG/ACT inhaler Inhale 2 puffs into the lungs every 6 (six) hours as needed for wheezing or shortness of breath. 1 Inhaler 2  . aspirin (ASPIRIN EC) 81 MG EC tablet Take 81 mg by mouth daily. Swallow whole.    . Cholecalciferol (VITAMIN D-3) 1000 UNITS CAPS Take 2,000 Units by mouth daily.    . furosemide (LASIX) 40 MG tablet Take 1 tablet (40 mg total) by mouth daily. 30 tablet 5  . metoprolol succinate  (TOPROL XL) 25 MG 24 hr tablet Take 1 tablet (25 mg total) by mouth daily. 30 tablet 5  . sacubitril-valsartan (ENTRESTO) 24-26 MG Take 1 tablet by mouth 2 (two) times daily. 60 tablet 5  . spironolactone (ALDACTONE) 25 MG tablet Take 0.5 tablets (12.5 mg total) by mouth daily. 30 tablet 5   No current facility-administered medications for this visit.      Allergies:   Patient has no known allergies.   Social History:  The patient  reports that she has never smoked. She has never used smokeless tobacco. She reports that she does not drink alcohol or use drugs.   Family History:   family history includes Breast cancer in her cousin; Breast cancer (age of onset: 38) in her daughter; Colon cancer in her father; Stroke in her mother.    Review of Systems: Review of Systems  Constitutional: Negative.   Respiratory: Negative.   Cardiovascular: Negative.   Gastrointestinal: Negative.   Musculoskeletal: Negative.   Neurological: Negative.   Psychiatric/Behavioral: Negative.   All other systems reviewed and are negative.    PHYSICAL EXAM: VS:  BP 120/82 (BP Location: Left Arm, Patient Position: Sitting, Cuff Size: Normal)   Pulse 60   Ht 5' 1.5" (1.562 m)   Wt 129 lb (58.5 kg)   LMP 02/21/1966   BMI 23.98 kg/m  , BMI Body mass index is 23.98 kg/m. GEN: Well nourished, well developed, in no acute distress  HEENT: normal  Neck: no JVD, carotid bruits,  or masses Cardiac: RRR; no murmurs, rubs, or gallops,no edema  Respiratory:  clear to auscultation bilaterally, normal work of breathing GI: soft, nontender, nondistended, + BS MS: no deformity or atrophy  Skin: warm and dry, no rash Neuro:  Strength and sensation are intact Psych: euthymic mood, full affect    Recent Labs: 12/31/2015: ALT 33 01/29/2016: TSH 0.588 01/30/2016: B Natriuretic Peptide 633.0 02/01/2016: Magnesium 2.1 02/03/2016: Hemoglobin 13.8; Platelets 176 02/26/2016: BUN 21; Creatinine, Ser 0.88; Potassium 4.5; Sodium  139    Lipid Panel Lab Results  Component Value Date   CHOL 228 (H) 12/01/2015   HDL 69.80 12/01/2015   LDLCALC 144 (H) 12/01/2015   TRIG 69.0 12/01/2015      Wt Readings from Last 3 Encounters:  03/15/16 129 lb (58.5 kg)  03/12/16 129 lb 6.4 oz (58.7 kg)  02/26/16 131 lb 6.4 oz (59.6 kg)       ASSESSMENT AND PLAN:  Chronic systolic heart failure (HCC) Previous hospital records reviewed Long discussion concerning CHF management, monitoring her weight And adjusting diuretics for 3 pound weight gain Current weight at home 127 pounds. We have recommended that she take additional Lasix after lunch for weight of 130 pounds or more We will not change her medications at this time Discussed low-salt diet Long discussion concerning whether she needs cardiac rehabilitation She prefers to do exercise on her own, active with 2 young grandchildren  Essential hypertension Blood pressure is well controlled on today's visit. No changes made to the medications.  Hypercholesterolemia Currently not on a cholesterol medication   Total encounter time more than 25 minutes  Greater than 50% was spent in counseling and coordination of care with the patient   Disposition:   F/U  3 months  No orders of the defined types were placed in this encounter.    Signed, Esmond Plants, M.D., Ph.D. 03/15/2016  [place], [place]

## 2016-03-18 ENCOUNTER — Ambulatory Visit: Payer: Medicare HMO | Admitting: Family

## 2016-03-28 ENCOUNTER — Emergency Department: Payer: Medicare HMO

## 2016-03-28 ENCOUNTER — Encounter: Payer: Self-pay | Admitting: Emergency Medicine

## 2016-03-28 ENCOUNTER — Emergency Department
Admission: EM | Admit: 2016-03-28 | Discharge: 2016-03-28 | Disposition: A | Discharge status: Home / Self Care | Payer: Medicare HMO | Attending: Emergency Medicine | Admitting: Emergency Medicine

## 2016-03-28 DIAGNOSIS — Z79899 Other long term (current) drug therapy: Secondary | ICD-10-CM | POA: Insufficient documentation

## 2016-03-28 DIAGNOSIS — I110 Hypertensive heart disease with heart failure: Secondary | ICD-10-CM | POA: Diagnosis not present

## 2016-03-28 DIAGNOSIS — R1030 Lower abdominal pain, unspecified: Secondary | ICD-10-CM | POA: Diagnosis not present

## 2016-03-28 DIAGNOSIS — K4090 Unilateral inguinal hernia, without obstruction or gangrene, not specified as recurrent: Secondary | ICD-10-CM

## 2016-03-28 DIAGNOSIS — K4030 Unilateral inguinal hernia, with obstruction, without gangrene, not specified as recurrent: Secondary | ICD-10-CM | POA: Diagnosis not present

## 2016-03-28 DIAGNOSIS — I5022 Chronic systolic (congestive) heart failure: Secondary | ICD-10-CM | POA: Insufficient documentation

## 2016-03-28 DIAGNOSIS — J45909 Unspecified asthma, uncomplicated: Secondary | ICD-10-CM | POA: Diagnosis not present

## 2016-03-28 DIAGNOSIS — R1032 Left lower quadrant pain: Secondary | ICD-10-CM | POA: Diagnosis not present

## 2016-03-28 LAB — CBC
HCT: 41.4 % (ref 35.0–47.0)
Hemoglobin: 14 g/dL (ref 12.0–16.0)
MCH: 30.4 pg (ref 26.0–34.0)
MCHC: 33.8 g/dL (ref 32.0–36.0)
MCV: 89.9 fL (ref 80.0–100.0)
PLATELETS: 205 10*3/uL (ref 150–440)
RBC: 4.6 MIL/uL (ref 3.80–5.20)
RDW: 13.7 % (ref 11.5–14.5)
WBC: 4.5 10*3/uL (ref 3.6–11.0)

## 2016-03-28 LAB — COMPREHENSIVE METABOLIC PANEL
ALK PHOS: 66 U/L (ref 38–126)
ALT: 19 U/L (ref 14–54)
AST: 25 U/L (ref 15–41)
Albumin: 4.6 g/dL (ref 3.5–5.0)
Anion gap: 8 (ref 5–15)
BUN: 12 mg/dL (ref 6–20)
CALCIUM: 9.5 mg/dL (ref 8.9–10.3)
CHLORIDE: 96 mmol/L — AB (ref 101–111)
CO2: 36 mmol/L — AB (ref 22–32)
CREATININE: 0.73 mg/dL (ref 0.44–1.00)
GFR calc Af Amer: >60 mL/min (ref >60)
Glucose, Bld: 115 mg/dL — ABNORMAL HIGH (ref 65–99)
Potassium: 4.2 mmol/L (ref 3.5–5.1)
SODIUM: 140 mmol/L (ref 135–145)
Total Bilirubin: 0.6 mg/dL (ref 0.3–1.2)
Total Protein: 7.6 g/dL (ref 6.5–8.1)

## 2016-03-28 LAB — URINALYSIS, COMPLETE (UACMP) WITH MICROSCOPIC
Bacteria, UA: NONE SEEN
Bilirubin Urine: NEGATIVE
Glucose, UA: NEGATIVE mg/dL
Hgb urine dipstick: NEGATIVE
KETONES UR: NEGATIVE mg/dL
Nitrite: NEGATIVE
PH: 7 (ref 5.0–8.0)
Protein, ur: NEGATIVE mg/dL
SPECIFIC GRAVITY, URINE: 1.012 (ref 1.005–1.030)

## 2016-03-28 LAB — LIPASE, BLOOD: LIPASE: 39 U/L (ref 11–51)

## 2016-03-28 LAB — LACTIC ACID, PLASMA: Lactic Acid, Venous: 1.4 mmol/L (ref 0.5–1.9)

## 2016-03-28 MED ORDER — IOPAMIDOL (ISOVUE-300) INJECTION 61%
100.0000 mL | Freq: Once | INTRAVENOUS | Status: AC | PRN
  Administered 2016-03-28: 100 mL via INTRAVENOUS

## 2016-03-28 MED ORDER — IOPAMIDOL (ISOVUE-300) INJECTION 61%: 30.0000 mL | Freq: Once | INTRAVENOUS | Status: DC | PRN

## 2016-03-28 MED ORDER — SODIUM CHLORIDE 0.9 % IV BOLUS (SEPSIS)
500.0000 mL | Freq: Once | INTRAVENOUS | Status: AC
  Administered 2016-03-28: 500 mL via INTRAVENOUS

## 2016-03-28 MED ORDER — FENTANYL CITRATE (PF) 100 MCG/2ML IJ SOLN
50.0000 ug | Freq: Once | INTRAMUSCULAR | Status: AC
  Administered 2016-03-28: 50 ug via INTRAVENOUS
  Filled 2016-03-28: qty 2

## 2016-03-28 NOTE — ED Triage Notes (Signed)
Pt presents to ED via POV with c/o LLQ abdominal pain that started when she got out of church. Pt denies N/V/D at this time.

## 2016-03-28 NOTE — ED Notes (Signed)
Pt. Verbalizes understanding of d/c instructions and follow-up. VS stable and pain controlled per pt.  Pt. In NAD at time of d/c and denies further concerns regarding this visit. Pt. Stable at the time of departure from the unit, departing unit by the safest and most appropriate manner per that pt condition and limitations. Pt advised to return to the ED at any time for emergent concerns, or for new/worsening symptoms.   

## 2016-03-28 NOTE — ED Provider Notes (Addendum)
[facility] Emergency Department Provider Note  ____________________________________________   I have reviewed the triage vital signs and the nursing notes.   HISTORY  Chief Complaint Abdominal Pain    HPI Kathy Dominguez is a 83 y.o. female who states she was coming home from church and she began of left lower quadrant pain that then said seemed to focalize just above her pelvis. The pain is worse when she was. She cannot otherwise describe it. She denies any chest pain shortness of breath numbness or weakness. Pain does not radiate. Not worse with anything that she can think of that from touching it nothing makes it better.Recall having had this before. Patient does have a history of some degree of colonic resection for cancer many years ago she states as well as hysterectomy. She denies any dysuria or urinary frequency, she has no other complaints. No vomiting or diarrhea. Last normal bowel movement which she states was quite recent and there's been no melena or bright red blood per rectum      Past Medical History:  Diagnosis Date  . Asthma   . Chronic systolic CHF (congestive heart failure) ([place])    a. 01/2015 Echo: EF 25-30%, sev diff HK, mild to mod MR, mildly dil LA, nl RV fxn, PASP 61 mmHg.  Marland Kitchen Hypercholesterolemia   . Hypertension   . Hypertensive heart disease with CHF (congestive heart failure) ([place])   . NICM (nonischemic cardiomyopathy) ([place])    a. 01/2015 Echo: EF 25-30% sev diff HK;  b. 01/2015 Lexi MV: EF 19%, small defect of mild severity in apex - likely breast attenuation, no ischemia.  Marland Kitchen NSVT (nonsustained ventricular tachycardia) ([place])   . Osteoporosis     Patient Active Problem List   Diagnosis Date Noted  . Chronic systolic heart failure ([place]) 02/16/2016  . Hypotension 02/16/2016  . Hypertensive heart disease with CHF (congestive heart failure) ([place]) 01/30/2016  . NSVT (nonsustained ventricular tachycardia) ([place]) 01/30/2016  . PAT  (paroxysmal atrial tachycardia) ([place]) 01/30/2016  . Hypokalemia 01/30/2016  . Abnormal liver function tests 12/04/2015  . Right knee pain 06/25/2015  . Right leg pain 02/24/2015  . Health care maintenance 06/13/2014  . Leg skin lesion, right 06/24/2013  . Lump or mass in breast 05/21/2013  . Leukopenia 02/23/2012  . Colon adenocarcinoma ([place]) 02/23/2012  . Asthma 02/23/2012  . Hypertension 02/22/2012  . Hypercholesterolemia 02/22/2012    Past Surgical History:  Procedure Laterality Date  . ABDOMINAL HYSTERECTOMY    . THYROIDECTOMY, PARTIAL  1956  . TUBAL LIGATION      Prior to Admission medications   Medication Sig Start Date End Date Taking? Authorizing Provider  albuterol (PROVENTIL HFA;VENTOLIN HFA) 108 (90 Base) MCG/ACT inhaler Inhale 2 puffs into the lungs every 6 (six) hours as needed for wheezing or shortness of breath. 12/04/15   Einar Pheasant, MD  aspirin (ASPIRIN EC) 81 MG EC tablet Take 81 mg by mouth daily. Swallow whole.    Historical Provider, MD  Cholecalciferol (VITAMIN D-3) 1000 UNITS CAPS Take 2,000 Units by mouth daily.    Historical Provider, MD  furosemide (LASIX) 40 MG tablet Take 1 tablet (40 mg total) by mouth daily. 02/13/16   Minna Merritts, MD  metoprolol succinate (TOPROL XL) 25 MG 24 hr tablet Take 1 tablet (25 mg total) by mouth daily. 02/13/16   Minna Merritts, MD  sacubitril-valsartan (ENTRESTO) 24-26 MG Take 1 tablet by mouth 2 (two) times daily. 02/13/16   Minna Merritts,  MD  spironolactone (ALDACTONE) 25 MG tablet Take 0.5 tablets (12.5 mg total) by mouth daily. 02/13/16   Minna Merritts, MD    Allergies Patient has no known allergies.  Family History  Problem Relation Age of Onset  . Stroke Mother   . Colon cancer Father   . Breast cancer Daughter 71  . Breast cancer Cousin     Social History Social History  Substance Use Topics  . Smoking status: Never Smoker  . Smokeless tobacco: Never Used  . Alcohol use No    Review of  Systems Constitutional: No fever/chills Eyes: No visual changes. ENT: No sore throat. No stiff neck no neck pain Cardiovascular: Denies chest pain. Respiratory: Denies shortness of breath. Gastrointestinal:   no vomiting.  No diarrhea.  No constipation. Genitourinary: Negative for dysuria. Musculoskeletal: Negative lower extremity swelling Skin: Negative for rash. Neurological: Negative for severe headaches, focal weakness or numbness. 10-point ROS otherwise negative.  ____________________________________________   PHYSICAL EXAM:  VITAL SIGNS: ED Triage Vitals  Enc Vitals Group     BP 03/28/16 1607 (!) 198/108     Pulse Rate 03/28/16 1607 68     Resp 03/28/16 1607 18     Temp 03/28/16 1607 97.6 F (36.4 C)     Temp Source 03/28/16 1607 Oral     SpO2 03/28/16 1607 94 %     Weight 03/28/16 1607 125 lb (56.7 kg)     Height 03/28/16 1607 5\' 1"  (1.549 m)     Head Circumference --      Peak Flow --      Pain Score 03/28/16 1608 6     Pain Loc --      Pain Edu? --      Excl. in [place]? --     Constitutional: Alert and oriented. Well appearing and in no acute distress. Eyes: Conjunctivae are normal. PERRL. EOMI. Head: Atraumatic. Nose: No congestion/rhinnorhea. Mouth/Throat: Mucous membranes are moist.  Oropharynx non-erythematous. Neck: No stridor.   Nontender with no meningismus Cardiovascular: Normal rate, regular rhythm. Grossly normal heart sounds.  Good peripheral circulation. Respiratory: Normal respiratory effort.  No retractions. Lungs CTAB. Abdominal: Soft and Positive mild suprapubic tenderness noted, no pulsatile mass no guarding or rebound no left lower quadrant tenderness nonsurgical abdomen no evidence of "pain out of proportion to exam. No distention. No guarding no rebound Back:  There is no focal tenderness or step off.  there is no midline tenderness there are no lesions noted. there is no CVA tenderness Musculoskeletal: No lower extremity tenderness, no upper  extremity tenderness. No joint effusions, no DVT signs strong distal pulses no edema Neurologic:  Normal speech and language. No gross focal neurologic deficits are appreciated.  Skin:  Skin is warm, dry and intact. No rash noted. Psychiatric: Mood and affect are normal. Speech and behavior are normal.  ____________________________________________   LABS (all labs ordered are listed, but only abnormal results are displayed)  Labs Reviewed  COMPREHENSIVE METABOLIC PANEL - Abnormal; Notable for the following:       Result Value   Chloride 96 (*)    CO2 36 (*)    Glucose, Bld 115 (*)    All other components within normal limits  URINALYSIS, COMPLETE (UACMP) WITH MICROSCOPIC - Abnormal; Notable for the following:    Color, Urine YELLOW (*)    APPearance CLEAR (*)    Leukocytes, UA TRACE (*)    Squamous Epithelial / LPF 0-5 (*)    All other components  within normal limits  LIPASE, BLOOD  CBC  LACTIC ACID, PLASMA  LACTIC ACID, PLASMA   ____________________________________________  EKG  I personally interpreted any EKGs ordered by me or triage  ____________________________________________  RADIOLOGY  I reviewed any imaging ordered by me or triage that were performed during my shift and, if possible, patient and/or family made aware of any abnormal findings. ____________________________________________   PROCEDURES  Procedure(s) performed: None  Procedures  Critical Care performed: None  ____________________________________________   INITIAL IMPRESSION / ASSESSMENT AND PLAN / ED COURSE  Pertinent labs & imaging results that were available during my care of the patient were reviewed by me and considered in my medical decision making (see chart for details).  Patient with lower abdominal pain, vital signs and workup otherwise thus far reassuring, we are giving her IV fluids. On recheck at this time,----------------------------------------- 6:44 PM on  03/28/2016 -----------------------------------------  Patient is in no acute distress with her pain well controlled and abdomen remains benign. We will obtain CT given her age  ----------------------------------------- 7:46 PM on 03/28/2016 -----------------------------------------  CT read showed a hernia, I went back and patient was sitting up. As she was sitting up I was able to feel the hernia but my opinion I was able to reduce it as she lay down. When I first did my exam she was lying flat. This certainly possible she has a hernia that is sliding in and out and this accounts for her transitory pains. She is in no acute distress right now. Given the history of possible SBO on CT scan I have asked surgery to come evaluate the patient and they agree with management and will come see her.      ----------------------------------------- 9:26 PM on 03/28/2016 -----------------------------------------  Seen and evaluated by surgery, clinically there is no evidence of ongoing SBO after hernia was reduced. There is no evidence of an hernia at this time either. Surgery did evaluate her they agree that she is safe for discharge. We have offered admission to patient and the family for observation but they would prefer to go home. She is eating and drinking with no difficulty. She has had no nausea. She is not vomiting. She's had no other symptoms and again this is to a hernia which is completely reduced. No evidence of ischemic gut, and patient is tolerating by mouth with no difficulty. I have a splint what to do in the event of a recurrence of the hernia and patient and family are eager for discharge. According surgery, she is not a surgical candidate unless it is emergent that she understands she must come back if she feels worse. ____________________________________________   FINAL CLINICAL IMPRESSION(S) / ED DIAGNOSES  Final diagnoses:  None      This chart was dictated using voice  recognition software.  Despite best efforts to proofread,  errors can occur which can change meaning.      Schuyler Amor, MD 03/28/16 [place], MD 03/28/16 [place], MD 03/28/16 2129

## 2016-03-28 NOTE — ED Notes (Signed)
FIRST NURSE NOTE: Pt reports LLQ abd pain that started this afternoon after church, denies N/V/D

## 2016-03-28 NOTE — ED Notes (Signed)
Surgery consult in room at this time.

## 2016-03-28 NOTE — Consult Note (Signed)
Date of Consultation:  03/28/2016  Requesting Physician:  Charlotte Crumb, MD  Reason for Consultation:  Abdominal pain  History of Present Illness: Kathy Dominguez is a 83 y.o. female who presents with a 1 day history of abdominal pain in the left lower quadrant.  Patient reports that she was on her way back from church this early afternoon when she had an acute onset of pain in the left groin / LLQ area.  She reports the pain was severe and has not had this type of pain before.  Denies any other symptoms.  Denies any fevers, chills, chest pain, shortness of breath.  Denies any nausea or vomiting prior to CT scan tonight, though had an episode of emesis after drinking the contrast which the patient reports she did not like at all.  Now denies any further nausea.  Reports she had a bowel movement this morning, though was a bit smaller than her normal, and has been having flatus, though a bit less than usual.  Now her pain is much improved and is otherwise asymptomatic.  In the Emergency Room, her laboratory studies are overall normal.  Her CT scan revealed a left inguinal hernia containing a small loop of small bowel, with transition point at the level of the hernia, concerning for a low-grade partial small bowel obstruction.  Past Medical History: Past Medical History:  Diagnosis Date  . Asthma   . Chronic systolic CHF (congestive heart failure) ([place])    a. 01/2015 Echo: EF 25-30%, sev diff HK, mild to mod MR, mildly dil LA, nl RV fxn, PASP 61 mmHg.  Marland Kitchen Hypercholesterolemia   . Hypertension   . Hypertensive heart disease with CHF (congestive heart failure) ([place])   . NICM (nonischemic cardiomyopathy) ([place])    a. 01/2015 Echo: EF 25-30% sev diff HK;  b. 01/2015 Lexi MV: EF 19%, small defect of mild severity in apex - likely breast attenuation, no ischemia.  Marland Kitchen NSVT (nonsustained ventricular tachycardia) ([place])   . Osteoporosis      Past Surgical History: Past Surgical History:  Procedure Laterality  Date  . ABDOMINAL HYSTERECTOMY    . THYROIDECTOMY, PARTIAL  1956  . TUBAL LIGATION      Home Medications: Prior to Admission medications   Medication Sig Start Date End Date Taking? Authorizing Provider  albuterol (PROVENTIL HFA;VENTOLIN HFA) 108 (90 Base) MCG/ACT inhaler Inhale 2 puffs into the lungs every 6 (six) hours as needed for wheezing or shortness of breath. 12/04/15   Einar Pheasant, MD  aspirin (ASPIRIN EC) 81 MG EC tablet Take 81 mg by mouth daily. Swallow whole.    Historical Provider, MD  Cholecalciferol (VITAMIN D-3) 1000 UNITS CAPS Take 2,000 Units by mouth daily.    Historical Provider, MD  furosemide (LASIX) 40 MG tablet Take 1 tablet (40 mg total) by mouth daily. 02/13/16   Minna Merritts, MD  metoprolol succinate (TOPROL XL) 25 MG 24 hr tablet Take 1 tablet (25 mg total) by mouth daily. 02/13/16   Minna Merritts, MD  sacubitril-valsartan (ENTRESTO) 24-26 MG Take 1 tablet by mouth 2 (two) times daily. 02/13/16   Minna Merritts, MD  spironolactone (ALDACTONE) 25 MG tablet Take 0.5 tablets (12.5 mg total) by mouth daily. 02/13/16   Minna Merritts, MD    Allergies: No Known Allergies  Social History:  reports that she has never smoked. She has never used smokeless tobacco. She reports that she does not drink alcohol or use drugs.   Family History:  Family History  Problem Relation Age of Onset  . Stroke Mother   . Colon cancer Father   . Breast cancer Daughter 18  . Breast cancer Cousin     Review of Systems: Review of Systems  Constitutional: Negative for chills and fever.  HENT: Negative for hearing loss.   Eyes: Negative for blurred vision.  Respiratory: Negative for cough and shortness of breath.   Cardiovascular: Negative for chest pain.  Gastrointestinal: Positive for abdominal pain. Negative for constipation, diarrhea, heartburn, nausea and vomiting.  Genitourinary: Negative for dysuria and hematuria.  Musculoskeletal: Negative for myalgias.  Skin:  Negative for rash.  Neurological: Negative for dizziness.  Psychiatric/Behavioral: Negative for depression.  All other systems reviewed and are negative.   Physical Exam BP (!) 156/69   Pulse (!) 53   Temp 97.6 F (36.4 C) (Oral)   Resp 18   Ht 5\' 1"  (1.549 m)   Wt 56.7 kg (125 lb)   LMP 02/21/1966   SpO2 90%   BMI 23.62 kg/m  CONSTITUTIONAL: No acute distress HEENT:  Normocephalic, atraumatic, extraocular motion intact. NECK: Trachea is midline, and there is no jugular venous distension.  RESPIRATORY:  Lungs are clear, and breath sounds are equal bilaterally. Normal respiratory effort without pathologic use of accessory muscles. CARDIOVASCULAR: Heart is regular without murmurs, gallops, or rubs. GI: The abdomen is soft, non-distended. There is mild discomfort in the left lower quadrant, at the groin level.  There is no palpable hernia in the left groin.  Upon coughing or bearing down, there is no bulging palpable.  Patient has a low midline incision and Pfannenstiel incision from her prior surgeries which are well healed. MUSCULOSKELETAL:  Normal muscle strength and tone in all four extremities.  No peripheral edema or cyanosis. SKIN: Skin turgor is normal. There are no pathologic skin lesions.  NEUROLOGIC:  Motor and sensation is grossly normal.  Cranial nerves are grossly intact. PSYCH:  Alert and oriented to person, place and time. Affect is normal.  Laboratory Analysis: Results for orders placed or performed during the hospital encounter of 03/28/16 (from the past 24 hour(s))  Lipase, blood     Status: None   Collection Time: 03/28/16  4:06 PM  Result Value Ref Range   Lipase 39 11 - 51 U/L  Comprehensive metabolic panel     Status: Abnormal   Collection Time: 03/28/16  4:06 PM  Result Value Ref Range   Sodium 140 135 - 145 mmol/L   Potassium 4.2 3.5 - 5.1 mmol/L   Chloride 96 (L) 101 - 111 mmol/L   CO2 36 (H) 22 - 32 mmol/L   Glucose, Bld 115 (H) 65 - 99 mg/dL   BUN  12 6 - 20 mg/dL   Creatinine, Ser 0.73 0.44 - 1.00 mg/dL   Calcium 9.5 8.9 - 10.3 mg/dL   Total Protein 7.6 6.5 - 8.1 g/dL   Albumin 4.6 3.5 - 5.0 g/dL   AST 25 15 - 41 U/L   ALT 19 14 - 54 U/L   Alkaline Phosphatase 66 38 - 126 U/L   Total Bilirubin 0.6 0.3 - 1.2 mg/dL   GFR calc non Af Amer >60 >60 mL/min   GFR calc Af Amer >60 >60 mL/min   Anion gap 8 5 - 15  CBC     Status: None   Collection Time: 03/28/16  4:06 PM  Result Value Ref Range   WBC 4.5 3.6 - 11.0 K/uL   RBC 4.60 3.80 -  5.20 MIL/uL   Hemoglobin 14.0 12.0 - 16.0 g/dL   HCT 41.4 35.0 - 47.0 %   MCV 89.9 80.0 - 100.0 fL   MCH 30.4 26.0 - 34.0 pg   MCHC 33.8 32.0 - 36.0 g/dL   RDW 13.7 11.5 - 14.5 %   Platelets 205 150 - 440 K/uL  Urinalysis, Complete w Microscopic     Status: Abnormal   Collection Time: 03/28/16  4:06 PM  Result Value Ref Range   Color, Urine YELLOW (A) YELLOW   APPearance CLEAR (A) CLEAR   Specific Gravity, Urine 1.012 1.005 - 1.030   pH 7.0 5.0 - 8.0   Glucose, UA NEGATIVE NEGATIVE mg/dL   Hgb urine dipstick NEGATIVE NEGATIVE   Bilirubin Urine NEGATIVE NEGATIVE   Ketones, ur NEGATIVE NEGATIVE mg/dL   Protein, ur NEGATIVE NEGATIVE mg/dL   Nitrite NEGATIVE NEGATIVE   Leukocytes, UA TRACE (A) NEGATIVE   RBC / HPF 0-5 0 - 5 RBC/hpf   WBC, UA 0-5 0 - 5 WBC/hpf   Bacteria, UA NONE SEEN NONE SEEN   Squamous Epithelial / LPF 0-5 (A) NONE SEEN   Hyaline Casts, UA PRESENT   Lactic acid, plasma     Status: None   Collection Time: 03/28/16  5:34 PM  Result Value Ref Range   Lactic Acid, Venous 1.4 0.5 - 1.9 mmol/L    Imaging: Ct Abdomen Pelvis W Contrast  Result Date: 03/28/2016 CLINICAL DATA:  Left lower quadrant pain starting this afternoon. Hysterectomy. Colon resection. EXAM: CT ABDOMEN AND PELVIS WITH CONTRAST TECHNIQUE: Multidetector CT imaging of the abdomen and pelvis was performed using the standard protocol following bolus administration of intravenous contrast. CONTRAST:  127mL  ISOVUE-300 IOPAMIDOL (ISOVUE-300) INJECTION 61% COMPARISON:  12/22/2015 aortic ultrasound. FINDINGS: Lower chest: Volume loss in the posterior right middle lobe. Mild cardiomegaly, without pericardial or pleural effusion. Hepatobiliary: Scattered well-circumscribed hypoattenuating liver lesions are likely cysts but are technically too small to characterize. Normal gallbladder, without biliary ductal dilatation. Pancreas: Suspicion of pancreas divisum, the prominent dorsal duct entering the duodenum on image 26/series 2. No evidence of acute pancreatitis. Spleen: Normal in size, without focal abnormality. Adrenals/Urinary Tract: Mild left adrenal thickening. Normal right adrenal gland. Too small to characterize left renal lesion. Normal right kidney, without hydronephrosis. Normal urinary bladder. Stomach/Bowel: Normal stomach, without wall thickening. Surgical sutures at the rectosigmoid junction. Colon is normal in caliber. Normal appendix, including on image 49/series 2. Mid small bowel loops are borderline dilated and fluid filled. Example 2.5 cm on image 50/series 2. Small bowel enters a left inguinal hernia, including on image 63/series 2. There is transition between borderline dilated and relatively decompressed small bowel (with distal small bowel identified on image 55/series 2). There is no evidence of edema to suggest incarceration. Vascular/Lymphatic: Aortic atherosclerosis. No abdominopelvic adenopathy. Reproductive: Hysterectomy.  No adnexal mass. Other: No significant free fluid.  Mild pelvic floor laxity. Musculoskeletal: Cortical thickening involving the L4 vertebral body is suspicious for Paget's disease. Trace anterolisthesis of L3-4, L4-5, and L5-S1. IMPRESSION: 1. Small positioned within a left inguinal hernia. Caliber transition at the level of the hernia is most consistent with low-grade partial small bowel obstruction. No evidence of strangulation or complicating ischemia. 2. 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 3.  Aortic atherosclerosis. Electronically Signed   By: Abigail Miyamoto M.D.   On: 03/28/2016 19:31    Assessment and Plan: This is a 83 y.o. female who presents with acute onset of left lower quadrant /  left groin pain, which has now improved, with findings on CT scan concerning for a left inguinal hernia with low grade partial SBO.  I have personally reviewed the patient's laboratory and imaging studies and discussed them with the patient.  The patient had a left inguinal hernia on CT scan, but was reduced by Dr. Burlene Arnt.  I could not palpate any hernia and there was no new bulging when asking the patient to bear down and/or cough.  Currently she reports mild soreness but no significant pain like earlier today.  She denies any other symptoms.  Her abdominal exam was benign and there is no acute surgical need.  Given her medical comorbidities, she is not a surgical candidate.  Discussed with the patient the option of admitting her to the medical service for observation overnight versus going home and she preferred to go home.  Patient was instructed to stay on clears today to allow more bowel rest and advance her diet tomorrow.  Strict return instructions were given to the patient, including if any recurring pain, nausea, vomiting, fever, or chills.  She will have a po challenge in the ED and discharge to home if tolerates.   Melvyn Neth, [place]

## 2016-03-28 NOTE — ED Notes (Signed)
Pt returned from CT at this time.  

## 2016-03-28 NOTE — Discharge Instructions (Signed)
Return to the emergency room for any new or worrisome symptoms including increased pain, if you have a hernia and are unable to push it back in as we discussed, if you have vomiting or you feel worse in any way.

## 2016-03-28 NOTE — ED Notes (Signed)
Pt given graham crackers and a cup of ice water at this time by this tech. Pt is able to swallow crackers and drink.

## 2016-04-07 ENCOUNTER — Encounter: Payer: Self-pay | Admitting: Internal Medicine

## 2016-04-07 ENCOUNTER — Ambulatory Visit (INDEPENDENT_AMBULATORY_CARE_PROVIDER_SITE_OTHER): Payer: Medicare HMO | Admitting: Internal Medicine

## 2016-04-07 DIAGNOSIS — I5022 Chronic systolic (congestive) heart failure: Secondary | ICD-10-CM

## 2016-04-07 DIAGNOSIS — K4030 Unilateral inguinal hernia, with obstruction, without gangrene, not specified as recurrent: Secondary | ICD-10-CM | POA: Diagnosis not present

## 2016-04-07 DIAGNOSIS — R7989 Other specified abnormal findings of blood chemistry: Secondary | ICD-10-CM

## 2016-04-07 DIAGNOSIS — I10 Essential (primary) hypertension: Secondary | ICD-10-CM

## 2016-04-07 DIAGNOSIS — R945 Abnormal results of liver function studies: Secondary | ICD-10-CM

## 2016-04-07 DIAGNOSIS — I110 Hypertensive heart disease with heart failure: Secondary | ICD-10-CM

## 2016-04-07 NOTE — Progress Notes (Signed)
Pre visit review using our clinic review tool, if applicable. No additional management support is needed unless otherwise documented below in the visit note. 

## 2016-04-07 NOTE — Progress Notes (Signed)
[name], female   DOB: 01/20/1929, 83 y.o.   MRN: [id number]   Subjective:    [name], female    DOB: 01-10-1929, 83 y.o.   MRN: [id number]  HPI  Patient here for an ER follow up. Was seen in the ER on 03/28/16 with lower abdominal pain (left lower quadrant).  Notes reviewed.  CT abdomen.  Found to have left inguinal hernia with what appeared to be low grade partial small bowel obstruction.   Evaluated by surgery.  States hernia was reduced.  Recommended no surgery.  No pain since ER visit.  Eating.  Bowels moving.  Overall she feels she is doing well.  Breathing better.     Past Medical History:  Diagnosis Date  . Asthma   . Chronic systolic CHF (congestive heart failure) ([place])    a. 01/2015 Echo: EF 25-30%, sev diff HK, mild to mod MR, mildly dil LA, nl RV fxn, PASP 61 mmHg.  Marland Kitchen Hypercholesterolemia   . Hypertension   . Hypertensive heart disease with CHF (congestive heart failure) ([place])   . NICM (nonischemic cardiomyopathy) ([place])    a. 01/2015 Echo: EF 25-30% sev diff HK;  b. 01/2015 Lexi MV: EF 19%, small defect of mild severity in apex - likely breast attenuation, no ischemia.  Marland Kitchen NSVT (nonsustained ventricular tachycardia) ([place])   . Osteoporosis    Past Surgical History:  Procedure Laterality Date  . ABDOMINAL HYSTERECTOMY    . THYROIDECTOMY, PARTIAL  1956  . TUBAL LIGATION     Family History  Problem Relation Age of Onset  . Stroke Mother   . Colon cancer Father   . Breast cancer Daughter 83  . Breast cancer Cousin    Social History   Social History  . Marital status: Widowed    Spouse name: N/A  . Number of children: N/A  . Years of education: N/A   Social History Main Topics  . Smoking status: Never Smoker  . Smokeless tobacco: Never Used  . Alcohol use No  . Drug use: No  . Sexual activity: Not Asked   Other Topics Concern  . None   Social History Narrative  . None    Outpatient Encounter Prescriptions as of 04/07/2016    Medication Sig  . albuterol (PROVENTIL HFA;VENTOLIN HFA) 108 (90 Base) MCG/ACT inhaler Inhale 2 puffs into the lungs every 6 (six) hours as needed for wheezing or shortness of breath.  Marland Kitchen aspirin (ASPIRIN EC) 81 MG EC tablet Take 81 mg by mouth daily. Swallow whole.  . Cholecalciferol (VITAMIN D-3) 1000 UNITS CAPS Take 2,000 Units by mouth daily.  . furosemide (LASIX) 40 MG tablet Take 1 tablet (40 mg total) by mouth daily.  . metoprolol succinate (TOPROL XL) 25 MG 24 hr tablet Take 1 tablet (25 mg total) by mouth daily.  . sacubitril-valsartan (ENTRESTO) 24-26 MG Take 1 tablet by mouth 2 (two) times daily.  Marland Kitchen spironolactone (ALDACTONE) 25 MG tablet Take 0.5 tablets (12.5 mg total) by mouth daily.   No facility-administered encounter medications on file as of 04/07/2016.     Review of Systems  Constitutional: Negative for appetite change and unexpected weight change.  Respiratory: Negative for cough and chest tightness.        Breathing better.    Cardiovascular: Negative for chest pain and leg swelling.  Gastrointestinal: Negative for abdominal pain, diarrhea, nausea and vomiting.  Genitourinary: Negative for difficulty urinating and dysuria.  Musculoskeletal: Negative for back pain.  Skin: Negative for color change and rash.  Psychiatric/Behavioral: Negative for agitation and dysphoric mood.       Objective:    Physical Exam  Constitutional: She appears well-developed and well-nourished. No distress.  Neck: Neck supple. No thyromegaly present.  Cardiovascular: Normal rate and regular rhythm.   Pulmonary/Chest: Breath sounds normal. No respiratory distress. She has no wheezes.  Abdominal: Soft. Bowel sounds are normal. There is no tenderness.  Could not appreciate hernia on exam.   Musculoskeletal: She exhibits no edema or tenderness.  Lymphadenopathy:    She has no cervical adenopathy.  Skin: No rash noted. No erythema.  Psychiatric: She has a normal mood and affect. Her  behavior is normal.    BP 118/70 (BP Location: Left Arm, Patient Position: Sitting, Cuff Size: Normal)   Pulse 82   Temp 98.2 F (36.8 C) (Oral)   Resp 16   Wt 128 lb 2 oz (58.1 kg)   LMP 02/21/1966   SpO2 94%   BMI 24.21 kg/m  Wt Readings from Last 3 Encounters:  04/07/16 128 lb 2 oz (58.1 kg)  03/28/16 125 lb (56.7 kg)  03/15/16 129 lb (58.5 kg)     Lab Results  Component Value Date   WBC 4.5 03/28/2016   HGB 14.0 03/28/2016   HCT 41.4 03/28/2016   PLT 205 03/28/2016   GLUCOSE 115 (H) 03/28/2016   CHOL 228 (H) 12/01/2015   TRIG 69.0 12/01/2015   HDL 69.80 12/01/2015   LDLDIRECT 128.3 02/08/2013   LDLCALC 144 (H) 12/01/2015   ALT 19 03/28/2016   AST 25 03/28/2016   NA 140 03/28/2016   K 4.2 03/28/2016   CL 96 (L) 03/28/2016   CREATININE 0.73 03/28/2016   BUN 12 03/28/2016   CO2 36 (H) 03/28/2016   TSH 0.588 01/29/2016   INR 1.0 12/09/2015   HGBA1C 6.1 10/10/2013    Ct Abdomen Pelvis W Contrast  Result Date: 03/28/2016 CLINICAL DATA:  Left lower quadrant pain starting this afternoon. Hysterectomy. Colon resection. EXAM: CT ABDOMEN AND PELVIS WITH CONTRAST TECHNIQUE: Multidetector CT imaging of the abdomen and pelvis was performed using the standard protocol following bolus administration of intravenous contrast. CONTRAST:  1107mL ISOVUE-300 IOPAMIDOL (ISOVUE-300) INJECTION 61% COMPARISON:  12/22/2015 aortic ultrasound. FINDINGS: Lower chest: Volume loss in the posterior right middle lobe. Mild cardiomegaly, without pericardial or pleural effusion. Hepatobiliary: Scattered well-circumscribed hypoattenuating liver lesions are likely cysts but are technically too small to characterize. Normal gallbladder, without biliary ductal dilatation. Pancreas: Suspicion of pancreas divisum, the prominent dorsal duct entering the duodenum on image 26/series 2. No evidence of acute pancreatitis. Spleen: Normal in size, without focal abnormality. Adrenals/Urinary Tract: Mild left adrenal  thickening. Normal right adrenal gland. Too small to characterize left renal lesion. Normal right kidney, without hydronephrosis. Normal urinary bladder. Stomach/Bowel: Normal stomach, without wall thickening. Surgical sutures at the rectosigmoid junction. Colon is normal in caliber. Normal appendix, including on image 49/series 2. Mid small bowel loops are borderline dilated and fluid filled. Example 2.5 cm on image 50/series 2. Small bowel enters a left inguinal hernia, including on image 63/series 2. There is transition between borderline dilated and relatively decompressed small bowel (with distal small bowel identified on image 55/series 2). There is no evidence of edema to suggest incarceration. Vascular/Lymphatic: Aortic atherosclerosis. No abdominopelvic adenopathy. Reproductive: Hysterectomy.  No adnexal mass. Other: No significant free fluid.  Mild pelvic floor laxity. Musculoskeletal: Cortical thickening involving the L4 vertebral body is suspicious for Paget's disease. Trace anterolisthesis  of L3-4, L4-5, and L5-S1. IMPRESSION: 1. Small positioned within a left inguinal hernia. Caliber transition at the level of the hernia is most consistent with low-grade partial small bowel obstruction. No evidence of strangulation or complicating ischemia. 2. Probable pancreas divisum without acute pancreatitis. 3.  Aortic atherosclerosis. Electronically Signed   By: Abigail Miyamoto M.D.   On: 03/28/2016 19:31       Assessment & Plan:   Problem List Items Addressed This Visit    Abnormal liver function tests    Follow liver panel.       Chronic systolic heart failure (HCC) (Chronic)    Recently admitted as outlined.  EF 25-30%.  Currently doing well on current regimen.  Breathing better.  Follow.        Hypertension    Blood pressure under good control.  Continue same medication regimen.  Follow pressures.  Follow metabolic panel.        Hypertensive heart disease with CHF (congestive heart failure)  ([place])    Seeing cardiology.  Breathing better.  Continue current medication regimen.  Follow.        Inguinal hernia of left side with obstruction    Recently evaluated in ER.  CT as outlined.  Evaluated by surgery.  Felt no further intervention warranted at that time.  No pain.  Bowels moving.  Eating.  Discussed surgery f/u.  She wants to follow.  Desires no further intervention.            Einar Pheasant, MD

## 2016-04-08 ENCOUNTER — Encounter: Payer: Self-pay | Admitting: Internal Medicine

## 2016-04-11 ENCOUNTER — Encounter: Payer: Self-pay | Admitting: Internal Medicine

## 2016-04-11 NOTE — Assessment & Plan Note (Signed)
Seeing cardiology.  Breathing better.  Continue current medication regimen.  Follow.

## 2016-04-11 NOTE — Assessment & Plan Note (Signed)
Recently evaluated in ER.  CT as outlined.  Evaluated by surgery.  Felt no further intervention warranted at that time.  No pain.  Bowels moving.  Eating.  Discussed surgery f/u.  She wants to follow.  Desires no further intervention.

## 2016-04-11 NOTE — Assessment & Plan Note (Signed)
Blood pressure under good control.  Continue same medication regimen.  Follow pressures.  Follow metabolic panel.   

## 2016-04-11 NOTE — Assessment & Plan Note (Signed)
Recently admitted as outlined.  EF 25-30%.  Currently doing well on current regimen.  Breathing better.  Follow.

## 2016-04-11 NOTE — Assessment & Plan Note (Signed)
Follow liver panel.  

## 2016-04-14 ENCOUNTER — Ambulatory Visit: Payer: Medicare HMO | Attending: Family | Admitting: Family

## 2016-04-14 ENCOUNTER — Encounter: Payer: Self-pay | Admitting: Family

## 2016-04-14 VITALS — BP 139/70 | HR 84 | Resp 20 | Ht 64.0 in | Wt 128.4 lb

## 2016-04-14 DIAGNOSIS — Z803 Family history of malignant neoplasm of breast: Secondary | ICD-10-CM | POA: Insufficient documentation

## 2016-04-14 DIAGNOSIS — I10 Essential (primary) hypertension: Secondary | ICD-10-CM

## 2016-04-14 DIAGNOSIS — Z5189 Encounter for other specified aftercare: Secondary | ICD-10-CM | POA: Insufficient documentation

## 2016-04-14 DIAGNOSIS — I5022 Chronic systolic (congestive) heart failure: Secondary | ICD-10-CM

## 2016-04-14 DIAGNOSIS — E875 Hyperkalemia: Secondary | ICD-10-CM | POA: Diagnosis not present

## 2016-04-14 DIAGNOSIS — Z79899 Other long term (current) drug therapy: Secondary | ICD-10-CM | POA: Diagnosis not present

## 2016-04-14 DIAGNOSIS — I509 Heart failure, unspecified: Secondary | ICD-10-CM | POA: Diagnosis not present

## 2016-04-14 DIAGNOSIS — Z90710 Acquired absence of both cervix and uterus: Secondary | ICD-10-CM | POA: Insufficient documentation

## 2016-04-14 DIAGNOSIS — K469 Unspecified abdominal hernia without obstruction or gangrene: Secondary | ICD-10-CM | POA: Diagnosis not present

## 2016-04-14 DIAGNOSIS — K4090 Unilateral inguinal hernia, without obstruction or gangrene, not specified as recurrent: Secondary | ICD-10-CM

## 2016-04-14 DIAGNOSIS — Z9851 Tubal ligation status: Secondary | ICD-10-CM | POA: Diagnosis not present

## 2016-04-14 DIAGNOSIS — Z9889 Other specified postprocedural states: Secondary | ICD-10-CM | POA: Diagnosis not present

## 2016-04-14 DIAGNOSIS — I110 Hypertensive heart disease with heart failure: Secondary | ICD-10-CM | POA: Insufficient documentation

## 2016-04-14 DIAGNOSIS — Z823 Family history of stroke: Secondary | ICD-10-CM | POA: Diagnosis not present

## 2016-04-14 DIAGNOSIS — Z808 Family history of malignant neoplasm of other organs or systems: Secondary | ICD-10-CM | POA: Insufficient documentation

## 2016-04-14 MED ORDER — SACUBITRIL-VALSARTAN 49-51 MG PO TABS: 1.0000 | ORAL_TABLET | Freq: Two times a day (BID) | ORAL | 5 refills | Status: DC

## 2016-04-14 NOTE — Progress Notes (Signed)
Patient ID: Faryal Marxen, female    DOB: 1928-08-24, 83 y.o.   MRN: [id number]  HPI Ms Desch is a 83 y/o female with a history of HTN, asthma, NSVT, hyperlipidemia, osteoporosis, hyperkalemia and chronic heart failure.  Last echo was done 01/29/16 and showed an EF of 25-30% along with mild/mod MR and mod/severely elevated PA pressure of 61 mm Hg.  Was last seen in ED on 03/28/16 for abdominal pain. Pt says she has a hernia. Was evaluated by surgery and deemed safe for discharge.  Was admitted on 01/28/16 with new onset heart failure. IV diuresed for 5.4 L and cardiology consult was obtained.   She presents today for follow up visit without any fatigue or shortness of breath. Denies any swelling in her legs or abdomen. Weighing herself daily and says that her weight has been stable.  No longer taking meloxicam for knee pain. Mentioned that her fluid pill causes her to use the bathroom all day and throughout the night even when taking it in the morning. Patient is able to walk for exercise but may start going to a gym by her house.   Past Medical History:  Diagnosis Date  . Asthma   . Chronic systolic CHF (congestive heart failure) ([place])    a. 01/2015 Echo: EF 25-30%, sev diff HK, mild to mod MR, mildly dil LA, nl RV fxn, PASP 61 mmHg.  Marland Kitchen Hypercholesterolemia   . Hypertension   . Hypertensive heart disease with CHF (congestive heart failure) ([place])   . NICM (nonischemic cardiomyopathy) ([place])    a. 01/2015 Echo: EF 25-30% sev diff HK;  b. 01/2015 Lexi MV: EF 19%, small defect of mild severity in apex - likely breast attenuation, no ischemia.  Marland Kitchen NSVT (nonsustained ventricular tachycardia) ([place])   . Osteoporosis    Past Surgical History:  Procedure Laterality Date  . ABDOMINAL HYSTERECTOMY    . THYROIDECTOMY, PARTIAL  1956  . TUBAL LIGATION     Family History  Problem Relation Age of Onset  . Stroke Mother   . Colon cancer Father   . Breast cancer Daughter 68  . Breast cancer Cousin     Social History  Substance Use Topics  . Smoking status: Never Smoker  . Smokeless tobacco: Never Used  . Alcohol use No   No Known Allergies Prior to Admission medications   Medication Sig Start Date End Date Taking? Authorizing Provider  albuterol (PROVENTIL HFA;VENTOLIN HFA) 108 (90 Base) MCG/ACT inhaler Inhale 2 puffs into the lungs every 6 (six) hours as needed for wheezing or shortness of breath. 12/04/15  Yes Einar Pheasant, MD  aspirin (ASPIRIN EC) 81 MG EC tablet Take 81 mg by mouth daily. Swallow whole.   Yes Historical Provider, MD  Cholecalciferol (VITAMIN D-3) 1000 UNITS CAPS Take 2,000 Units by mouth daily.   Yes Historical Provider, MD  furosemide (LASIX) 40 MG tablet Take 1 tablet (40 mg total) by mouth daily. Patient taking differently: Take 20 mg by mouth daily.  02/13/16  Yes Minna Merritts, MD  metoprolol succinate (TOPROL XL) 25 MG 24 hr tablet Take 1 tablet (25 mg total) by mouth daily. 02/13/16  Yes Minna Merritts, MD  spironolactone (ALDACTONE) 25 MG tablet Take 0.5 tablets (12.5 mg total) by mouth daily. 02/13/16  Yes Minna Merritts, MD  sacubitril-valsartan (ENTRESTO) 49-51 MG Take 1 tablet by mouth 2 (two) times daily. 04/14/16   Alisa Graff, FNP    Review of Systems  Constitutional: Negative  for appetite change, fever and unexpected weight change.  HENT: Negative for congestion and postnasal drip.   Eyes: Negative.   Respiratory: Negative for chest tightness and shortness of breath.   Cardiovascular: Negative for chest pain, palpitations and leg swelling.  Gastrointestinal: Negative for diarrhea, nausea and vomiting.  Endocrine: Negative.   Genitourinary: Negative.   Musculoskeletal: Negative for back pain and neck pain.  Skin: Negative.   Allergic/Immunologic: Negative.   Neurological: Negative for dizziness, light-headedness and headaches.  Hematological: Negative for adenopathy. Bruises/bleeds easily.  Psychiatric/Behavioral: Positive for sleep  disturbance. Negative for dysphoric mood and suicidal ideas. The patient is not nervous/anxious.        Doesn't sleep through the night since moving to new house; using one pillow      Physical Exam  Constitutional: She is oriented to person, place, and time. She appears well-developed and well-nourished.  HENT:  Head: Normocephalic and atraumatic.  Eyes: Conjunctivae are normal. Pupils are equal, round, and reactive to light.  Neck: Normal range of motion. Neck supple. No JVD present.  Cardiovascular: Normal rate and regular rhythm.   Pulmonary/Chest: Effort normal. She has no wheezes. She has no rales.  Abdominal: Soft. She exhibits no distension. There is no tenderness.  Musculoskeletal: She exhibits no edema or tenderness.  Neurological: She is alert and oriented to person, place, and time.  Skin: Skin is warm and dry.  Psychiatric: She has a normal mood and affect. Thought content normal.  Nursing note and vitals reviewed.    Vitals:   04/14/16 0909  BP: 139/70  Pulse: 84  Resp: 20  SpO2: 96%  Weight: 128 lb 6 oz (58.2 kg)  Height: 5\' 4"  (1.626 m)   Wt Readings from Last 3 Encounters:  04/14/16 128 lb 6 oz (58.2 kg)  04/07/16 128 lb 2 oz (58.1 kg)  03/28/16 125 lb (56.7 kg)   Lab Results  Component Value Date   CREATININE 0.73 03/28/2016   CREATININE 0.88 02/26/2016   CREATININE 1.02 (H) 02/13/2016   Assessment & Plan:  1: Chronic heart failure with reduced ejection fraction- - NYHA class I - euvolemic - Weighing daily. At home weighs about 125-126 lbs. Instructed to call for an overnight weight gain of >2 pounds or a weekly weight gain of >5 pounds - no longer adding salt to her food. Reviewed the importance of following a 2000 mg sodium diet. Says she isn't eating as much because food doesn't taste as good without salt but she has started using Ms. Deliah Boston which is helping a little. Enjoys fresh foods and has started buying more frozen foods than canned foods. -  Patient has about 10 days of Entresto left. Once completed, will increase Entresto dose to 49/51 mg bid and decrease furosemide dose (40 mg to 20 mg). Also taking 12.5mg  spironolactone and metoprolol succ 25 mg qd - Returns to cardiology Rockey Situ) in April/May  2: HTN- - was experiencing hypotension on last visit but a little elevated today at 139/70; has had morning medication - Doesn't have blood pressure cuff at home - sees PCP Nicki Reaper) in ~3 months  3: Hernia- - PCP Nicki Reaper) says she may need surgery but would have to be cleared by cardiology first. Dr. Nicki Reaper recommended patient to monitor for worsening of symptoms and not to lift anything heavy - Will continue to follow with PCP  4. Hyperkalemia- - KCl was d/c after last visit - Potassium 2 weeks ago was 4.2  She also sees Dr. Raul Del in  April  Patient brought prescription bottles with her today and these were reviewed with patient in detail. Asked patient to continue to bring medications to visits.  Return here in 1 month or sooner for any questions/problems before then.   Darrow Bussing, PharmD Pharmacy Resident 04/14/2016 9:44 AM

## 2016-04-14 NOTE — Patient Instructions (Addendum)
Continue weighing daily and call for an overnight weight gain of > 2 pounds or a weekly weight gain of >5 pounds.  Finish current dose of entresto and then begin entresto 49/51mg  twice daily.   When you begin the 49/51mg  dose of entresto, decrease your fluid pill to 1/2 tablet once daily.

## 2016-04-16 DIAGNOSIS — Z961 Presence of intraocular lens: Secondary | ICD-10-CM | POA: Diagnosis not present

## 2016-04-16 DIAGNOSIS — I110 Hypertensive heart disease with heart failure: Secondary | ICD-10-CM | POA: Diagnosis not present

## 2016-04-16 DIAGNOSIS — Z0000 Encounter for general adult medical examination without abnormal findings: Secondary | ICD-10-CM | POA: Diagnosis not present

## 2016-04-16 DIAGNOSIS — J45909 Unspecified asthma, uncomplicated: Secondary | ICD-10-CM | POA: Diagnosis not present

## 2016-04-16 DIAGNOSIS — M179 Osteoarthritis of knee, unspecified: Secondary | ICD-10-CM | POA: Diagnosis not present

## 2016-04-16 DIAGNOSIS — Z9849 Cataract extraction status, unspecified eye: Secondary | ICD-10-CM | POA: Diagnosis not present

## 2016-04-16 DIAGNOSIS — Z6822 Body mass index (BMI) 22.0-22.9, adult: Secondary | ICD-10-CM | POA: Diagnosis not present

## 2016-04-16 DIAGNOSIS — Z7982 Long term (current) use of aspirin: Secondary | ICD-10-CM | POA: Diagnosis not present

## 2016-04-16 DIAGNOSIS — R609 Edema, unspecified: Secondary | ICD-10-CM | POA: Diagnosis not present

## 2016-04-16 DIAGNOSIS — I509 Heart failure, unspecified: Secondary | ICD-10-CM | POA: Diagnosis not present

## 2016-04-28 ENCOUNTER — Ambulatory Visit
Admission: RE | Admit: 2016-04-28 | Discharge: 2016-04-28 | Disposition: A | Discharge status: Home / Self Care | Payer: Medicare HMO | Source: Ambulatory Visit | Attending: General Surgery | Admitting: General Surgery

## 2016-04-28 DIAGNOSIS — N6320 Unspecified lump in the left breast, unspecified quadrant: Secondary | ICD-10-CM

## 2016-04-28 DIAGNOSIS — R928 Other abnormal and inconclusive findings on diagnostic imaging of breast: Secondary | ICD-10-CM | POA: Diagnosis not present

## 2016-04-28 DIAGNOSIS — N6489 Other specified disorders of breast: Secondary | ICD-10-CM | POA: Diagnosis not present

## 2016-04-28 HISTORY — DX: Malignant neoplasm of colon, unspecified: C18.9

## 2016-05-02 ENCOUNTER — Inpatient Hospital Stay
Admission: EM | Admit: 2016-05-02 | Discharge: 2016-05-05 | DRG: 351 | Disposition: A | Discharge status: Home / Self Care | Payer: MEDICARE | Attending: General Surgery | Admitting: General Surgery

## 2016-05-02 DIAGNOSIS — R1032 Left lower quadrant pain: Secondary | ICD-10-CM | POA: Diagnosis not present

## 2016-05-02 DIAGNOSIS — Z800 Family history of malignant neoplasm of digestive organs: Secondary | ICD-10-CM

## 2016-05-02 DIAGNOSIS — K56609 Unspecified intestinal obstruction, unspecified as to partial versus complete obstruction: Secondary | ICD-10-CM | POA: Diagnosis present

## 2016-05-02 DIAGNOSIS — Z85038 Personal history of other malignant neoplasm of large intestine: Secondary | ICD-10-CM

## 2016-05-02 DIAGNOSIS — Z79899 Other long term (current) drug therapy: Secondary | ICD-10-CM | POA: Diagnosis not present

## 2016-05-02 DIAGNOSIS — I110 Hypertensive heart disease with heart failure: Secondary | ICD-10-CM | POA: Diagnosis present

## 2016-05-02 DIAGNOSIS — I429 Cardiomyopathy, unspecified: Secondary | ICD-10-CM | POA: Diagnosis not present

## 2016-05-02 DIAGNOSIS — M810 Age-related osteoporosis without current pathological fracture: Secondary | ICD-10-CM | POA: Diagnosis not present

## 2016-05-02 DIAGNOSIS — K4090 Unilateral inguinal hernia, without obstruction or gangrene, not specified as recurrent: Secondary | ICD-10-CM | POA: Diagnosis not present

## 2016-05-02 DIAGNOSIS — I5022 Chronic systolic (congestive) heart failure: Secondary | ICD-10-CM | POA: Diagnosis present

## 2016-05-02 DIAGNOSIS — Z7982 Long term (current) use of aspirin: Secondary | ICD-10-CM | POA: Diagnosis not present

## 2016-05-02 DIAGNOSIS — J45909 Unspecified asthma, uncomplicated: Secondary | ICD-10-CM | POA: Diagnosis present

## 2016-05-02 DIAGNOSIS — R1084 Generalized abdominal pain: Secondary | ICD-10-CM | POA: Diagnosis not present

## 2016-05-02 DIAGNOSIS — K4030 Unilateral inguinal hernia, with obstruction, without gangrene, not specified as recurrent: Principal | ICD-10-CM | POA: Diagnosis present

## 2016-05-02 LAB — COMPREHENSIVE METABOLIC PANEL
ALK PHOS: 57 U/L (ref 38–126)
ALT: 21 U/L (ref 14–54)
AST: 28 U/L (ref 15–41)
Albumin: 4.1 g/dL (ref 3.5–5.0)
Anion gap: 5 (ref 5–15)
BILIRUBIN TOTAL: 0.5 mg/dL (ref 0.3–1.2)
BUN: 17 mg/dL (ref 6–20)
CALCIUM: 9.3 mg/dL (ref 8.9–10.3)
CHLORIDE: 99 mmol/L — AB (ref 101–111)
CO2: 33 mmol/L — ABNORMAL HIGH (ref 22–32)
CREATININE: 0.76 mg/dL (ref 0.44–1.00)
Glucose, Bld: 192 mg/dL — ABNORMAL HIGH (ref 65–99)
Potassium: 3.9 mmol/L (ref 3.5–5.1)
Sodium: 137 mmol/L (ref 135–145)
Total Protein: 7.1 g/dL (ref 6.5–8.1)

## 2016-05-02 LAB — CBC
HCT: 39.7 % (ref 35.0–47.0)
Hemoglobin: 13.1 g/dL (ref 12.0–16.0)
MCH: 30.6 pg (ref 26.0–34.0)
MCHC: 33.1 g/dL (ref 32.0–36.0)
MCV: 92.3 fL (ref 80.0–100.0)
PLATELETS: 191 10*3/uL (ref 150–440)
RBC: 4.3 MIL/uL (ref 3.80–5.20)
RDW: 13.8 % (ref 11.5–14.5)
WBC: 4.6 10*3/uL (ref 3.6–11.0)

## 2016-05-02 LAB — URINALYSIS, COMPLETE (UACMP) WITH MICROSCOPIC
Bacteria, UA: NONE SEEN
Bilirubin Urine: NEGATIVE
Glucose, UA: NEGATIVE mg/dL
HGB URINE DIPSTICK: NEGATIVE
Ketones, ur: NEGATIVE mg/dL
Nitrite: NEGATIVE
PH: 7 (ref 5.0–8.0)
Protein, ur: NEGATIVE mg/dL
Specific Gravity, Urine: 1.014 (ref 1.005–1.030)

## 2016-05-02 LAB — LIPASE, BLOOD: Lipase: 33 U/L (ref 11–51)

## 2016-05-02 MED ORDER — IOPAMIDOL (ISOVUE-300) INJECTION 61%
30.0000 mL | Freq: Once | INTRAVENOUS | Status: AC
  Administered 2016-05-03: 30 mL via ORAL

## 2016-05-02 NOTE — ED Notes (Signed)
ED Provider at bedside. 

## 2016-05-02 NOTE — ED Triage Notes (Signed)
Pt presents to ED c/o left side lower abd pain sicne yesterday. Pt was recently seen a few weeks ago with similar symptoms and was told she had a hernia.

## 2016-05-03 ENCOUNTER — Inpatient Hospital Stay: Payer: MEDICARE

## 2016-05-03 ENCOUNTER — Emergency Department: Payer: MEDICARE

## 2016-05-03 ENCOUNTER — Encounter: Payer: Self-pay | Admitting: Radiology

## 2016-05-03 DIAGNOSIS — M810 Age-related osteoporosis without current pathological fracture: Secondary | ICD-10-CM | POA: Diagnosis not present

## 2016-05-03 DIAGNOSIS — Z85038 Personal history of other malignant neoplasm of large intestine: Secondary | ICD-10-CM | POA: Diagnosis not present

## 2016-05-03 DIAGNOSIS — K4090 Unilateral inguinal hernia, without obstruction or gangrene, not specified as recurrent: Secondary | ICD-10-CM | POA: Diagnosis not present

## 2016-05-03 DIAGNOSIS — K56609 Unspecified intestinal obstruction, unspecified as to partial versus complete obstruction: Secondary | ICD-10-CM | POA: Diagnosis not present

## 2016-05-03 DIAGNOSIS — I429 Cardiomyopathy, unspecified: Secondary | ICD-10-CM | POA: Diagnosis not present

## 2016-05-03 DIAGNOSIS — Z800 Family history of malignant neoplasm of digestive organs: Secondary | ICD-10-CM | POA: Diagnosis not present

## 2016-05-03 DIAGNOSIS — J45909 Unspecified asthma, uncomplicated: Secondary | ICD-10-CM | POA: Diagnosis not present

## 2016-05-03 DIAGNOSIS — I5022 Chronic systolic (congestive) heart failure: Secondary | ICD-10-CM | POA: Diagnosis not present

## 2016-05-03 DIAGNOSIS — R1032 Left lower quadrant pain: Secondary | ICD-10-CM | POA: Diagnosis not present

## 2016-05-03 DIAGNOSIS — K4030 Unilateral inguinal hernia, with obstruction, without gangrene, not specified as recurrent: Secondary | ICD-10-CM | POA: Diagnosis not present

## 2016-05-03 DIAGNOSIS — I110 Hypertensive heart disease with heart failure: Secondary | ICD-10-CM | POA: Diagnosis not present

## 2016-05-03 DIAGNOSIS — Z79899 Other long term (current) drug therapy: Secondary | ICD-10-CM | POA: Diagnosis not present

## 2016-05-03 DIAGNOSIS — I509 Heart failure, unspecified: Secondary | ICD-10-CM | POA: Diagnosis not present

## 2016-05-03 DIAGNOSIS — R1084 Generalized abdominal pain: Secondary | ICD-10-CM | POA: Diagnosis not present

## 2016-05-03 DIAGNOSIS — Z7982 Long term (current) use of aspirin: Secondary | ICD-10-CM | POA: Diagnosis not present

## 2016-05-03 LAB — GLUCOSE, CAPILLARY: GLUCOSE-CAPILLARY: 111 mg/dL — AB (ref 65–99)

## 2016-05-03 LAB — LACTIC ACID, PLASMA: LACTIC ACID, VENOUS: 1.6 mmol/L (ref 0.5–1.9)

## 2016-05-03 MED ORDER — METOPROLOL SUCCINATE ER 25 MG PO TB24
25.0000 mg | ORAL_TABLET | Freq: Every day | ORAL | Status: DC
  Administered 2016-05-03 – 2016-05-04 (×2): 25 mg via ORAL
  Filled 2016-05-03: qty 1

## 2016-05-03 MED ORDER — SPIRONOLACTONE 25 MG PO TABS
12.5000 mg | ORAL_TABLET | Freq: Every day | ORAL | Status: DC
  Administered 2016-05-03: 12.5 mg via ORAL
  Filled 2016-05-03 (×2): qty 1

## 2016-05-03 MED ORDER — SACUBITRIL-VALSARTAN 49-51 MG PO TABS
1.0000 | ORAL_TABLET | Freq: Two times a day (BID) | ORAL | Status: DC
  Administered 2016-05-03 – 2016-05-04 (×3): 1 via ORAL
  Filled 2016-05-03 (×3): qty 1

## 2016-05-03 MED ORDER — MORPHINE SULFATE (PF) 2 MG/ML IV SOLN: 1.0000 mg | INTRAVENOUS | Status: DC | PRN

## 2016-05-03 MED ORDER — ONDANSETRON HCL 4 MG/2ML IJ SOLN
4.0000 mg | Freq: Once | INTRAMUSCULAR | Status: AC
  Administered 2016-05-03: 4 mg via INTRAVENOUS

## 2016-05-03 MED ORDER — DEXTROSE-NACL 5-0.2 % IV SOLN
INTRAVENOUS | Status: DC
  Administered 2016-05-03 (×2): via INTRAVENOUS

## 2016-05-03 MED ORDER — ACETAMINOPHEN 650 MG RE SUPP: 650.0000 mg | RECTAL | Status: DC | PRN

## 2016-05-03 MED ORDER — ONDANSETRON 4 MG PO TBDP: 4.0000 mg | ORAL_TABLET | ORAL | Status: DC | PRN

## 2016-05-03 MED ORDER — CEFAZOLIN SODIUM-DEXTROSE 2-4 GM/100ML-% IV SOLN
2.0000 g | Freq: Once | INTRAVENOUS | Status: AC
  Administered 2016-05-04: 2 g via INTRAVENOUS
  Filled 2016-05-03: qty 100

## 2016-05-03 MED ORDER — ALBUTEROL SULFATE (2.5 MG/3ML) 0.083% IN NEBU: 3.0000 mL | INHALATION_SOLUTION | Freq: Four times a day (QID) | RESPIRATORY_TRACT | Status: DC | PRN

## 2016-05-03 MED ORDER — IOPAMIDOL (ISOVUE-300) INJECTION 61%
75.0000 mL | Freq: Once | INTRAVENOUS | Status: AC | PRN
  Administered 2016-05-03: 75 mL via INTRAVENOUS

## 2016-05-03 MED ORDER — CEFAZOLIN SODIUM-DEXTROSE 2-3 GM-% IV SOLR
2.0000 g | Freq: Once | INTRAVENOUS | Status: DC
  Filled 2016-05-03: qty 50

## 2016-05-03 MED ORDER — ONDANSETRON HCL 4 MG/2ML IJ SOLN
INTRAMUSCULAR | Status: AC
  Administered 2016-05-03: 4 mg via INTRAVENOUS
  Filled 2016-05-03: qty 2

## 2016-05-03 NOTE — ED Notes (Signed)
Pt transport to 218 

## 2016-05-03 NOTE — ED Notes (Signed)
Patient transported to CT 

## 2016-05-03 NOTE — ED Notes (Signed)
Admitting Tollie Pizza) Provider at bedside.

## 2016-05-03 NOTE — ED Notes (Signed)
ED Provider at bedside. 

## 2016-05-03 NOTE — Progress Notes (Signed)
Pain free since admission. Tolerating liquids OK. Voiding well. No further nausea/ vomiting. Reviewed plans for hernia repair.

## 2016-05-03 NOTE — ED Provider Notes (Signed)
[facility] Emergency Department Provider Note   ____________________________________________   First MD Initiated Contact with Patient 05/02/16 2331     (approximate)  I have reviewed the triage vital signs and the nursing notes.   HISTORY  Chief Complaint Abdominal Pain    HPI Kathy Dominguez is a 83 y.o. female who comes into the hospital today with some left lower quadrant abdominal pain. She reports this started this afternoon when she was at a T. She reports that she didn't take anything for her pain. She was here a month ago with similar symptoms. She was told that it was due to her hernia and she has seen her family doctor who has been watching it. She is high risk for surgery so the recommendation was not for surgery at this time. This is the first time since then that it started to hurt. The patient reports that the pain is now radiating to her mid abdomen. The pain was initially a 10 out of 10 but currently a 5-6 out of 10 in intensity. The patient's last bowel movement was this afternoon and it was small. She reports that she's had some nausea and did vomit what she ate at the TV. The patient denies any chest pain or shortness of breath. She is here today for evaluation of this pain.   Past Medical History:  Diagnosis Date  . Asthma   . Chronic systolic CHF (congestive heart failure) ([place])    a. 01/2015 Echo: EF 25-30%, sev diff HK, mild to mod MR, mildly dil LA, nl RV fxn, PASP 61 mmHg.  . Colon cancer ([place]) 2005  . Hypercholesterolemia   . Hypertension   . Hypertensive heart disease with CHF (congestive heart failure) ([place])   . NICM (nonischemic cardiomyopathy) ([place])    a. 01/2015 Echo: EF 25-30% sev diff HK;  b. 01/2015 Lexi MV: EF 19%, small defect of mild severity in apex - likely breast attenuation, no ischemia.  Marland Kitchen NSVT (nonsustained ventricular tachycardia) ([place])   . Osteoporosis     Patient Active Problem List   Diagnosis Date Noted  .  Inguinal hernia of left side with obstruction   . Chronic systolic heart failure ([place]) 02/16/2016  . Hypotension 02/16/2016  . Hypertensive heart disease with CHF (congestive heart failure) ([place]) 01/30/2016  . NSVT (nonsustained ventricular tachycardia) ([place]) 01/30/2016  . PAT (paroxysmal atrial tachycardia) ([place]) 01/30/2016  . Hypokalemia 01/30/2016  . Abnormal liver function tests 12/04/2015  . Right knee pain 06/25/2015  . Right leg pain 02/24/2015  . Health care maintenance 06/13/2014  . Leg skin lesion, right 06/24/2013  . Lump or mass in breast 05/21/2013  . Leukopenia 02/23/2012  . Colon adenocarcinoma ([place]) 02/23/2012  . Asthma 02/23/2012  . Hypertension 02/22/2012  . Hypercholesterolemia 02/22/2012    Past Surgical History:  Procedure Laterality Date  . ABDOMINAL HYSTERECTOMY    . BREAST BIOPSY Left 4/15`   BENIGN BREAST EPITHELIUM WITH NODULAR FIBROSIS.  Marland Kitchen THYROIDECTOMY, PARTIAL  1956  . TUBAL LIGATION      Prior to Admission medications   Medication Sig Start Date End Date Taking? Authorizing Provider  albuterol (PROVENTIL HFA;VENTOLIN HFA) 108 (90 Base) MCG/ACT inhaler Inhale 2 puffs into the lungs every 6 (six) hours as needed for wheezing or shortness of breath. 12/04/15   Einar Pheasant, MD  aspirin (ASPIRIN EC) 81 MG EC tablet Take 81 mg by mouth daily. Swallow whole.    Historical Provider, MD  Cholecalciferol (VITAMIN D-3) 1000  UNITS CAPS Take 2,000 Units by mouth daily.    Historical Provider, MD  furosemide (LASIX) 40 MG tablet Take 1 tablet (40 mg total) by mouth daily. Patient taking differently: Take 20 mg by mouth daily.  02/13/16   Minna Merritts, MD  metoprolol succinate (TOPROL XL) 25 MG 24 hr tablet Take 1 tablet (25 mg total) by mouth daily. 02/13/16   Minna Merritts, MD  sacubitril-valsartan (ENTRESTO) 49-51 MG Take 1 tablet by mouth 2 (two) times daily. 04/14/16   Alisa Graff, FNP  spironolactone (ALDACTONE) 25 MG tablet Take 0.5 tablets (12.5  mg total) by mouth daily. 02/13/16   Minna Merritts, MD    Allergies Patient has no known allergies.  Family History  Problem Relation Age of Onset  . Stroke Mother   . Colon cancer Father   . Breast cancer Daughter 5  . Breast cancer Cousin     Social History Social History  Substance Use Topics  . Smoking status: Never Smoker  . Smokeless tobacco: Never Used  . Alcohol use No    Review of Systems Constitutional: No fever/chills Eyes: No visual changes. ENT: No sore throat. Cardiovascular: Denies chest pain. Respiratory: Denies shortness of breath. Gastrointestinal: abdominal pain.   nausea,  vomiting.  No diarrhea.  No constipation. Genitourinary: Negative for dysuria. Musculoskeletal: Negative for back pain. Skin: Negative for rash. Neurological: Negative for headaches, focal weakness or numbness.  10-point ROS otherwise negative.  ____________________________________________   PHYSICAL EXAM:  VITAL SIGNS: ED Triage Vitals  Enc Vitals Group     BP 05/02/16 1940 (!) 180/79     Pulse Rate 05/02/16 1940 62     Resp 05/02/16 1940 19     Temp 05/02/16 1940 98.1 F (36.7 C)     Temp Source 05/02/16 1940 Oral     SpO2 05/02/16 1940 92 %     Weight 05/02/16 1941 129 lb (58.5 kg)     Height 05/02/16 1941 5\' 4"  (1.626 m)     Head Circumference --      Peak Flow --      Pain Score 05/03/16 0001 3     Pain Loc --      Pain Edu? --      Excl. in [place]? --     Constitutional: Alert and oriented. Well appearing and in Mild distress. Eyes: Conjunctivae are normal. PERRL. EOMI. Head: Atraumatic. Nose: No congestion/rhinnorhea. Mouth/Throat: Mucous membranes are moist.  Oropharynx non-erythematous. Cardiovascular: Normal rate, regular rhythm. Grossly normal heart sounds.  Good peripheral circulation. Respiratory: Normal respiratory effort.  No retractions. Lungs CTAB. Gastrointestinal: Soft with some left lower quadrant and mid abdominal tenderness to palpation. No  distention. Positive bowel sounds Musculoskeletal: No lower extremity tenderness nor edema.   Neurologic:  Normal speech and language.  Skin:  Skin is warm, dry and intact.  Psychiatric: Mood and affect are normal.   ____________________________________________   LABS (all labs ordered are listed, but only abnormal results are displayed)  Labs Reviewed  COMPREHENSIVE METABOLIC PANEL - Abnormal; Notable for the following:       Result Value   Chloride 99 (*)    CO2 33 (*)    Glucose, Bld 192 (*)    All other components within normal limits  URINALYSIS, COMPLETE (UACMP) WITH MICROSCOPIC - Abnormal; Notable for the following:    Color, Urine YELLOW (*)    APPearance CLEAR (*)    Leukocytes, UA TRACE (*)    Squamous Epithelial /  LPF 0-5 (*)    All other components within normal limits  LIPASE, BLOOD  CBC  LACTIC ACID, PLASMA   ____________________________________________  EKG  none ____________________________________________  RADIOLOGY  CT abd and pelvis ____________________________________________   PROCEDURES  Procedure(s) performed: None  Procedures  Critical Care performed: No  ____________________________________________   INITIAL IMPRESSION / ASSESSMENT AND PLAN / ED COURSE  Pertinent labs & imaging results that were available during my care of the patient were reviewed by me and considered in my medical decision making (see chart for details).  This is an 83 year old who comes into the hospital with some left lower quadrant abdominal pain. She did have this pain before which was due to her hernia but there was a question for possible bowel obstruction. I will repeat the patient's CT scan to ensure that she does not have follow-up instruction causing the symptoms. Other concerns would be diverticulitis or mesenteric ischemia.  Clinical Course as of May 03 148  Mon May 03, 2016  0131 1. Dilated small bowel loops with transition in the left  hemipelvis probably representing obstruction. 2. Mild cardiomegaly. 3. Small hiatal hernia. 4. Probable pancreas divisum. 5. Aortic atherosclerosis.   CT Abdomen Pelvis W Contrast [AW]    Clinical Course User Index [AW] Loney Hering, MD   I contacted Dr. Bary Castilla regarding the patient's bowel obstruction. I will admit the patient to the surgical service. The patient has no complaints at this time.  ____________________________________________   FINAL CLINICAL IMPRESSION(S) / ED DIAGNOSES  Final diagnoses:  Small bowel obstruction      NEW MEDICATIONS STARTED DURING THIS VISIT:  New Prescriptions   No medications on file     Note:  This document was prepared using Dragon voice recognition software and may include unintentional dictation errors.    Loney Hering, MD 05/03/16 [phone]

## 2016-05-03 NOTE — H&P (Signed)
Kathy Dominguez is an 83 y.o. female.   Chief Complaint: Abdominal pain HPI:  83 y/o with second episode of LLQ pain in the last month. Seen in ED early March with CT evidence of left inguinal hernia. Reduced, discharged.  Recurrent pain today while at an afternoon tea party. Similar location, today associated with vomiting.  Repeat CT suggests SBO with transition area in left pelvis, no SB loop in hernia as noted last month.  Admitted for further evaluation.   Past Medical History:  Diagnosis Date  . Asthma   . Chronic systolic CHF (congestive heart failure) ([place])    a. 01/2015 Echo: EF 25-30%, sev diff HK, mild to mod MR, mildly dil LA, nl RV fxn, PASP 61 mmHg.  . Colon cancer ([place]) 2005  . Hypercholesterolemia   . Hypertension   . Hypertensive heart disease with CHF (congestive heart failure) ([place])   . NICM (nonischemic cardiomyopathy) ([place])    a. 01/2015 Echo: EF 25-30% sev diff HK;  b. 01/2015 Lexi MV: EF 19%, small defect of mild severity in apex - likely breast attenuation, no ischemia.  Marland Kitchen NSVT (nonsustained ventricular tachycardia) ([place])   . Osteoporosis     Past Surgical History:  Procedure Laterality Date  . ABDOMINAL HYSTERECTOMY    . BREAST BIOPSY Left 4/15`   BENIGN BREAST EPITHELIUM WITH NODULAR FIBROSIS.  Marland Kitchen THYROIDECTOMY, PARTIAL  1956  . TUBAL LIGATION      Family History  Problem Relation Age of Onset  . Stroke Mother   . Colon cancer Father   . Breast cancer Daughter 45  . Breast cancer Cousin    Social History:  reports that she has never smoked. She has never used smokeless tobacco. She reports that she does not drink alcohol or use drugs.  Allergies: No Known Allergies   (Not in a hospital admission)  Results for orders placed or performed during the hospital encounter of 05/02/16 (from the past 48 hour(s))  Lipase, blood     Status: None   Collection Time: 05/02/16  7:43 PM  Result Value Ref Range   Lipase 33 11 - 51 U/L  Comprehensive metabolic  panel     Status: Abnormal   Collection Time: 05/02/16  7:43 PM  Result Value Ref Range   Sodium 137 135 - 145 mmol/L   Potassium 3.9 3.5 - 5.1 mmol/L   Chloride 99 (L) 101 - 111 mmol/L   CO2 33 (H) 22 - 32 mmol/L   Glucose, Bld 192 (H) 65 - 99 mg/dL   BUN 17 6 - 20 mg/dL   Creatinine, Ser 0.76 0.44 - 1.00 mg/dL   Calcium 9.3 8.9 - 10.3 mg/dL   Total Protein 7.1 6.5 - 8.1 g/dL   Albumin 4.1 3.5 - 5.0 g/dL   AST 28 15 - 41 U/L   ALT 21 14 - 54 U/L   Alkaline Phosphatase 57 38 - 126 U/L   Total Bilirubin 0.5 0.3 - 1.2 mg/dL   GFR calc non Af Amer >60 >60 mL/min   GFR calc Af Amer >60 >60 mL/min    Comment: (NOTE) The eGFR has been calculated using the CKD EPI equation. This calculation has not been validated in all clinical situations. eGFR's persistently <60 mL/min signify possible Chronic Kidney Disease.    Anion gap 5 5 - 15  CBC     Status: None   Collection Time: 05/02/16  7:43 PM  Result Value Ref Range   WBC 4.6 3.6 - 11.0  K/uL   RBC 4.30 3.80 - 5.20 MIL/uL   Hemoglobin 13.1 12.0 - 16.0 g/dL   HCT 39.7 35.0 - 47.0 %   MCV 92.3 80.0 - 100.0 fL   MCH 30.6 26.0 - 34.0 pg   MCHC 33.1 32.0 - 36.0 g/dL   RDW 13.8 11.5 - 14.5 %   Platelets 191 150 - 440 K/uL  Urinalysis, Complete w Microscopic     Status: Abnormal   Collection Time: 05/02/16  7:43 PM  Result Value Ref Range   Color, Urine YELLOW (A) YELLOW   APPearance CLEAR (A) CLEAR   Specific Gravity, Urine 1.014 1.005 - 1.030   pH 7.0 5.0 - 8.0   Glucose, UA NEGATIVE NEGATIVE mg/dL   Hgb urine dipstick NEGATIVE NEGATIVE   Bilirubin Urine NEGATIVE NEGATIVE   Ketones, ur NEGATIVE NEGATIVE mg/dL   Protein, ur NEGATIVE NEGATIVE mg/dL   Nitrite NEGATIVE NEGATIVE   Leukocytes, UA TRACE (A) NEGATIVE   RBC / HPF 0-5 0 - 5 RBC/hpf   WBC, UA 0-5 0 - 5 WBC/hpf   Bacteria, UA NONE SEEN NONE SEEN   Squamous Epithelial / LPF 0-5 (A) NONE SEEN  Lactic acid, plasma     Status: None   Collection Time: 05/02/16 11:51 PM    Result Value Ref Range   Lactic Acid, Venous 1.6 0.5 - 1.9 mmol/L   Ct Abdomen Pelvis W Contrast  Result Date: 05/03/2016 CLINICAL DATA:  83 y/o F; left lower quadrant and mid abdominal pain. EXAM: CT ABDOMEN AND PELVIS WITH CONTRAST TECHNIQUE: Multidetector CT imaging of the abdomen and pelvis was performed using the standard protocol following bolus administration of intravenous contrast. CONTRAST:  37m ISOVUE-300 IOPAMIDOL (ISOVUE-300) INJECTION 61% COMPARISON:  03/28/2016 CT of abdomen and pelvis. FINDINGS: Lower chest: Mild cardiomegaly.  Small hiatal hernia. Hepatobiliary: Scattered subcentimeter lucencies throughout the liver are well-circumscribed and likely represent cysts. Normal gallbladder. No intra or extrahepatic biliary ductal dilatation. Pancreas: Probable pancreas divisum.  No acute inflammatory changes. Spleen: Normal in size without focal abnormality. Adrenals/Urinary Tract: Normal adrenal glands. Left kidney upper pole subcentimeter cyst. No other focal kidney lesion identified. No hydronephrosis. Normal bladder. Stomach/Bowel: Dilated small bowel with transition point in the left hemipelvis (series 2, image 60). Rectosigmoid postsurgical changes are patent. Normal colon. Normal appendix. Vascular/Lymphatic: Aortic atherosclerosis. No enlarged abdominal or pelvic lymph nodes. Reproductive: Status post hysterectomy. No adnexal masses. Other: No herniation of small bowel into the left inguinal canal. Musculoskeletal: Stable grade 1 L3-4 and L4-5 anterolisthesis. Stable prominent trabecula of the L4 vertebral body probably representing hemangioma. Moderate right and mild left hip osteoarthrosis. IMPRESSION: 1. Dilated small bowel loops with transition in the left hemipelvis probably representing obstruction. 2. Mild cardiomegaly. 3. Small hiatal hernia. 4. Probable pancreas divisum. 5. Aortic atherosclerosis. Electronically Signed   By: LKristine GarbeM.D.   On: 05/03/2016 01:27     Review of Systems  Constitutional: Negative.   HENT: Negative.   Respiratory: Negative.   Cardiovascular: Negative.   Gastrointestinal: Positive for vomiting.  Genitourinary: Negative.   Musculoskeletal: Negative.   Skin: Negative.     Blood pressure 107/66, pulse 60, temperature 98.1 F (36.7 C), temperature source Oral, resp. rate 20, height 5' 4"  (1.626 m), weight 129 lb (58.5 kg), last menstrual period 02/21/1966, SpO2 92 %. Physical Exam  Constitutional: She appears well-developed and well-nourished.  HENT:  Head: Normocephalic.  Eyes: Pupils are equal, round, and reactive to light.  Neck: Neck supple. No JVD present.  Cardiovascular: Normal rate, regular rhythm, S1 normal, S2 normal and normal heart sounds.   No lower extremity edema.   Respiratory: Breath sounds normal.  GI: Soft. Bowel sounds are normal. She exhibits no distension. There is no tenderness. There is no rebound and no guarding.    Lymphadenopathy:       Right: No inguinal adenopathy present.       Left: No inguinal adenopathy present.     Assessment/Plan Intermittent SBO secondary to left inguinal hernia, less likely adhesions from previous hysterectomy/ colon resection. History CHF diagnosed in January 2018, presently asymptomatic. Plan: Observation, f/u plain films. Possible laparoscopy to confirm no internal defect, elective left inguinal hernia repair.   Robert Bellow, MD 05/03/2016, 2:52 AM

## 2016-05-04 ENCOUNTER — Encounter
Admission: EM | Disposition: A | Discharge status: Home / Self Care | Payer: Self-pay | Source: Home / Self Care | Attending: General Surgery

## 2016-05-04 ENCOUNTER — Inpatient Hospital Stay: Payer: MEDICARE | Admitting: Anesthesiology

## 2016-05-04 ENCOUNTER — Encounter: Payer: Self-pay | Admitting: *Deleted

## 2016-05-04 DIAGNOSIS — K4090 Unilateral inguinal hernia, without obstruction or gangrene, not specified as recurrent: Secondary | ICD-10-CM | POA: Diagnosis not present

## 2016-05-04 DIAGNOSIS — J45909 Unspecified asthma, uncomplicated: Secondary | ICD-10-CM | POA: Diagnosis not present

## 2016-05-04 DIAGNOSIS — I509 Heart failure, unspecified: Secondary | ICD-10-CM | POA: Diagnosis not present

## 2016-05-04 DIAGNOSIS — I110 Hypertensive heart disease with heart failure: Secondary | ICD-10-CM | POA: Diagnosis not present

## 2016-05-04 DIAGNOSIS — K56609 Unspecified intestinal obstruction, unspecified as to partial versus complete obstruction: Secondary | ICD-10-CM | POA: Diagnosis not present

## 2016-05-04 HISTORY — PX: INGUINAL HERNIA REPAIR: SHX194

## 2016-05-04 LAB — GLUCOSE, CAPILLARY: Glucose-Capillary: 158 mg/dL — ABNORMAL HIGH (ref 65–99)

## 2016-05-04 SURGERY — REPAIR, HERNIA, INGUINAL, ADULT
Anesthesia: General | Laterality: Left | Wound class: Clean

## 2016-05-04 MED ORDER — METOPROLOL TARTRATE 25 MG PO TABS
ORAL_TABLET | ORAL | Status: AC
  Filled 2016-05-04: qty 1

## 2016-05-04 MED ORDER — DEXTROSE-NACL 5-0.2 % IV SOLN
INTRAVENOUS | Status: DC
  Administered 2016-05-04: 12:00:00 via INTRAVENOUS

## 2016-05-04 MED ORDER — PROMETHAZINE HCL 25 MG/ML IJ SOLN: 6.2500 mg | INTRAMUSCULAR | Status: DC | PRN

## 2016-05-04 MED ORDER — BUPIVACAINE-EPINEPHRINE (PF) 0.5% -1:200000 IJ SOLN
INTRAMUSCULAR | Status: AC
  Filled 2016-05-04: qty 30

## 2016-05-04 MED ORDER — LACTATED RINGERS IV SOLN
INTRAVENOUS | Status: DC | PRN
  Administered 2016-05-04: 09:00:00 via INTRAVENOUS

## 2016-05-04 MED ORDER — ONDANSETRON HCL 4 MG/2ML IJ SOLN
INTRAMUSCULAR | Status: DC | PRN
  Administered 2016-05-04: 4 mg via INTRAVENOUS

## 2016-05-04 MED ORDER — OXYCODONE HCL 5 MG PO TABS: 5.0000 mg | ORAL_TABLET | Freq: Once | ORAL | Status: DC | PRN

## 2016-05-04 MED ORDER — TRAMADOL HCL 50 MG PO TABS: 50.0000 mg | ORAL_TABLET | ORAL | Status: DC | PRN

## 2016-05-04 MED ORDER — ACETAMINOPHEN 650 MG RE SUPP: 650.0000 mg | Freq: Four times a day (QID) | RECTAL | Status: DC | PRN

## 2016-05-04 MED ORDER — FENTANYL CITRATE (PF) 100 MCG/2ML IJ SOLN
INTRAMUSCULAR | Status: DC | PRN
  Administered 2016-05-04: 50 ug via INTRAVENOUS

## 2016-05-04 MED ORDER — LIDOCAINE HCL (CARDIAC) 20 MG/ML IV SOLN
INTRAVENOUS | Status: DC | PRN
  Administered 2016-05-04: 60 mg via INTRAVENOUS

## 2016-05-04 MED ORDER — EPHEDRINE SULFATE 50 MG/ML IJ SOLN
INTRAMUSCULAR | Status: DC | PRN
  Administered 2016-05-04 (×2): 15 mg via INTRAVENOUS

## 2016-05-04 MED ORDER — ONDANSETRON HCL 4 MG PO TABS: 4.0000 mg | ORAL_TABLET | Freq: Four times a day (QID) | ORAL | Status: DC | PRN

## 2016-05-04 MED ORDER — PROPOFOL 10 MG/ML IV BOLUS
INTRAVENOUS | Status: DC | PRN
  Administered 2016-05-04: 80 mg via INTRAVENOUS

## 2016-05-04 MED ORDER — FENTANYL CITRATE (PF) 100 MCG/2ML IJ SOLN: 25.0000 ug | INTRAMUSCULAR | Status: DC | PRN

## 2016-05-04 MED ORDER — MEPERIDINE HCL 50 MG/ML IJ SOLN: 6.2500 mg | INTRAMUSCULAR | Status: DC | PRN

## 2016-05-04 MED ORDER — KETOROLAC TROMETHAMINE 30 MG/ML IJ SOLN
INTRAMUSCULAR | Status: DC | PRN
  Administered 2016-05-04: 30 mg via INTRAVENOUS

## 2016-05-04 MED ORDER — ACETAMINOPHEN 325 MG PO TABS
650.0000 mg | ORAL_TABLET | Freq: Four times a day (QID) | ORAL | Status: DC | PRN
Start: 2016-05-04 — End: 2016-05-05
  Administered 2016-05-05: 650 mg via ORAL
  Filled 2016-05-04: qty 2

## 2016-05-04 MED ORDER — ACETAMINOPHEN 10 MG/ML IV SOLN
INTRAVENOUS | Status: DC | PRN
  Administered 2016-05-04: 1000 mg via INTRAVENOUS

## 2016-05-04 MED ORDER — BUPIVACAINE-EPINEPHRINE (PF) 0.5% -1:200000 IJ SOLN
INTRAMUSCULAR | Status: DC | PRN
Start: 2016-05-04 — End: 2016-05-04
  Administered 2016-05-04: 30 mL

## 2016-05-04 MED ORDER — MORPHINE SULFATE (PF) 2 MG/ML IV SOLN: 1.0000 mg | INTRAVENOUS | Status: DC | PRN

## 2016-05-04 MED ORDER — OXYCODONE HCL 5 MG/5ML PO SOLN: 5.0000 mg | Freq: Once | ORAL | Status: DC | PRN

## 2016-05-04 MED ORDER — ONDANSETRON HCL 4 MG/2ML IJ SOLN: 4.0000 mg | Freq: Four times a day (QID) | INTRAMUSCULAR | Status: DC | PRN

## 2016-05-04 SURGICAL SUPPLY — 32 items
BLADE SURG 15 STRL SS SAFETY (BLADE) ×4 IMPLANT
CANISTER SUCT 1200ML W/VALVE (MISCELLANEOUS) ×2 IMPLANT
CHLORAPREP W/TINT 26ML (MISCELLANEOUS) ×2 IMPLANT
DECANTER SPIKE VIAL GLASS SM (MISCELLANEOUS) ×2 IMPLANT
DRAIN PENROSE 1/4X12 LTX (DRAIN) ×2 IMPLANT
DRAPE LAPAROTOMY 100X77 ABD (DRAPES) ×2 IMPLANT
DRSG TEGADERM 4X4.75 (GAUZE/BANDAGES/DRESSINGS) ×2 IMPLANT
DRSG TELFA 4X3 1S NADH ST (GAUZE/BANDAGES/DRESSINGS) ×2 IMPLANT
ELECT REM PT RETURN 9FT ADLT (ELECTROSURGICAL) ×2
ELECTRODE REM PT RTRN 9FT ADLT (ELECTROSURGICAL) ×1 IMPLANT
GLOVE BIO SURGEON STRL SZ7.5 (GLOVE) ×8 IMPLANT
GLOVE INDICATOR 8.0 STRL GRN (GLOVE) ×6 IMPLANT
GOWN STRL REUS W/ TWL LRG LVL3 (GOWN DISPOSABLE) ×3 IMPLANT
GOWN STRL REUS W/TWL LRG LVL3 (GOWN DISPOSABLE) ×3
KIT RM TURNOVER STRD PROC AR (KITS) ×2 IMPLANT
LABEL OR SOLS (LABEL) ×2 IMPLANT
MESH MARLEX PLUG MEDIUM (Mesh General) ×2 IMPLANT
NDL SAFETY 22GX1.5 (NEEDLE) ×4 IMPLANT
NEEDLE HYPO 25X1 1.5 SAFETY (NEEDLE) ×2 IMPLANT
PACK BASIN MINOR ARMC (MISCELLANEOUS) ×2 IMPLANT
STRIP CLOSURE SKIN 1/2X4 (GAUZE/BANDAGES/DRESSINGS) ×2 IMPLANT
SUT SURGILON 0 BLK (SUTURE) ×2 IMPLANT
SUT VIC AB 2-0 SH 27 (SUTURE) ×1
SUT VIC AB 2-0 SH 27XBRD (SUTURE) ×1 IMPLANT
SUT VIC AB 3-0 54X BRD REEL (SUTURE) ×1 IMPLANT
SUT VIC AB 3-0 BRD 54 (SUTURE) ×1
SUT VIC AB 3-0 SH 27 (SUTURE) ×1
SUT VIC AB 3-0 SH 27X BRD (SUTURE) ×1 IMPLANT
SUT VIC AB 4-0 FS2 27 (SUTURE) ×2 IMPLANT
SWABSTK COMLB BENZOIN TINCTURE (MISCELLANEOUS) ×2 IMPLANT
SYR 3ML LL SCALE MARK (SYRINGE) IMPLANT
SYR CONTROL 10ML (SYRINGE) ×2 IMPLANT

## 2016-05-04 NOTE — H&P (Signed)
Good night. Pain free. For left inguinal hernia repair.

## 2016-05-04 NOTE — Anesthesia Preprocedure Evaluation (Addendum)
Anesthesia Evaluation  Patient identified by MRN, date of birth, ID band Patient awake    Reviewed: Allergy & Precautions, NPO status , Patient's Chart, lab work & pertinent test results  History of Anesthesia Complications Negative for: history of anesthetic complications  Airway Mallampati: II  TM Distance: >3 FB Neck ROM: Full    Dental  (+) Poor Dentition, Missing   Pulmonary asthma , neg sleep apnea, neg COPD,    breath sounds clear to auscultation- rhonchi (-) wheezing      Cardiovascular hypertension, Pt. on medications +CHF   Rhythm:Regular Rate:Normal - Systolic murmurs and - Diastolic murmurs Echo 09/02/36: - Left ventricle: The cavity size was mildly dilated. Systolic   function was severely reduced. The estimated ejection fraction   was in the range of 25% to 30%. Severe diffuse hypokinesis.   Regional wall motion abnormalities cannot be excluded. Left   ventricular diastolic function parameters were normal. - Mitral valve: There was mild to moderate regurgitation. - Left atrium: The atrium was mildly dilated. - Right ventricle: Systolic function was normal. - Pulmonary arteries: Systolic pressure was moderate to severely   elevated. PA peak pressure: 61 mm Hg (S).    Neuro/Psych negative neurological ROS  negative psych ROS   GI/Hepatic negative GI ROS, Neg liver ROS,   Endo/Other  negative endocrine ROSneg diabetes  Renal/GU negative Renal ROS     Musculoskeletal negative musculoskeletal ROS (+)   Abdominal (+) - obese,   Peds  Hematology negative hematology ROS (+)   Anesthesia Other Findings Past Medical History: No date: Asthma No date: Chronic systolic CHF (congestive heart failure*     Comment: a. 01/2015 Echo: EF 25-30%, sev diff HK, mild               to mod MR, mildly dil LA, nl RV fxn, PASP 61               mmHg. 2005: Colon cancer ([place]) No date: Hypercholesterolemia No date:  Hypertension No date: Hypertensive heart disease with CHF (congestiv* No date: NICM (nonischemic cardiomyopathy) ([place])     Comment: a. 01/2015 Echo: EF 25-30% sev diff HK;  b.               01/2015 Lexi MV: EF 19%, small defect of mild               severity in apex - likely breast attenuation,               no ischemia. No date: NSVT (nonsustained ventricular tachycardia) ([place]* No date: Osteoporosis   Reproductive/Obstetrics                            Anesthesia Physical Anesthesia Plan  ASA: III  Anesthesia Plan: General   Post-op Pain Management:    Induction: Intravenous  Airway Management Planned: LMA  Additional Equipment:   Intra-op Plan:   Post-operative Plan:   Informed Consent: I have reviewed the patients History and Physical, chart, labs and discussed the procedure including the risks, benefits and alternatives for the proposed anesthesia with the patient or authorized representative who has indicated his/her understanding and acceptance.   Dental advisory given  Plan Discussed with: CRNA and Anesthesiologist  Anesthesia Plan Comments:         Anesthesia Quick Evaluation

## 2016-05-04 NOTE — Anesthesia Post-op Follow-up Note (Cosign Needed)
Anesthesia QCDR form completed.        

## 2016-05-04 NOTE — Transfer of Care (Signed)
Immediate Anesthesia Transfer of Care Note  Patient: Kathy Dominguez  Procedure(s) Performed: Procedure(s): HERNIA REPAIR INGUINAL ADULT (Left)  Patient Location: PACU  Anesthesia Type:General  Level of Consciousness: sedated  Airway & Oxygen Therapy: Patient Spontanous Breathing and Patient connected to face mask oxygen  Post-op Assessment: Report given to RN and Post -op Vital signs reviewed and stable  Post vital signs: Reviewed and stable  Last Vitals:  Vitals:   05/04/16 0527 05/04/16 0831  BP: (!) 153/62 (!) 168/79  Pulse: 69 63  Resp: 18 16  Temp: 36.6 C 36.4 C    Last Pain:  Vitals:   05/04/16 0831  TempSrc: Tympanic  PainSc:          Complications: No apparent anesthesia complications

## 2016-05-04 NOTE — Anesthesia Postprocedure Evaluation (Signed)
Anesthesia Post Note  Patient: Kathy Dominguez  Procedure(s) Performed: Procedure(s) (LRB): HERNIA REPAIR INGUINAL ADULT (Left)  Patient location during evaluation: PACU Anesthesia Type: General Level of consciousness: awake and alert and oriented Pain management: pain level controlled Vital Signs Assessment: post-procedure vital signs reviewed and stable Respiratory status: spontaneous breathing, nonlabored ventilation and respiratory function stable Cardiovascular status: blood pressure returned to baseline and stable Postop Assessment: no signs of nausea or vomiting Anesthetic complications: no     Last Vitals:  Vitals:   05/04/16 1021 05/04/16 1036  BP: 140/75 (!) 150/72  Pulse: 65   Resp: 17   Temp:  36.4 C    Last Pain:  Vitals:   05/04/16 1036  TempSrc:   PainSc: 0-No pain                 Talise Sligh

## 2016-05-04 NOTE — Anesthesia Procedure Notes (Signed)
Procedure Name: LMA Insertion Performed by: Justus Memory Pre-anesthesia Checklist: Patient identified, Emergency Drugs available, Suction available and Patient being monitored Patient Re-evaluated:Patient Re-evaluated prior to inductionOxygen Delivery Method: Circle system utilized Preoxygenation: Pre-oxygenation with 100% oxygen Intubation Type: IV induction Ventilation: Mask ventilation without difficulty LMA: LMA inserted LMA Size: 3.5 Number of attempts: 1 Placement Confirmation: positive ETCO2 and CO2 detector

## 2016-05-04 NOTE — Op Note (Signed)
Preoperative diagnosis: Previously incarcerated left inguinal hernia.  Postoperative diagnosis: Same.  Operative procedure: Left inguinal hernia repair with hard plug and patch.  Operating surgeon: Ollen Bowl, M.D.  Anesthesia: Gen. by LMA, Marcaine 0.5% with 1-200,000 epinephrine, 30 mL, Toradol 30 mg.  Estimated blood loss: Less than 5 mL.  Clinical note: This 83 year old woman had 2 trips to the emergency room over the last month with left lower quadrant abdominal pain and on the first occasion evidence of small bowel in a inguinal hernia and on the second occasion suggestion of a partial small bowel obstruction. At the time of her second visit the hernia was reduced she was admitted and is brought to the operative this time for planned repair of the hernia.  The patient received Kefzol prior to the procedure.  Operative note: The patient tolerated general anesthesia well. The abdomen was prepped with ChloraPrep after removal of hair with clippers. A 5 cm skin line incision was made in the left lower quadrant along the anticipated course inguinal canal after the introduction of field block anesthesia. The skin was incised sharply and the remaining dissection completed with electrocautery. Hemostasis was also achieved with 3-0 Vicryl ties. The external Bleich was opened in the direction of its fibers. The ilioinguinal and iliohypogastric nerves were identified and protected. The thickened hernia sac was dissected free back to the level of the internal ring. Sac was opened and no contents. This was inverted into the abdominal cavity. The internal ring was obliterated making use of a medium Bard PerFix plug. This was anchored to the iliopubic tract with interrupted 0 Surgilon sutures. An onlay mesh was placed after removal of the round ligament with cautery. The mesh was anchored to the pubic tubercle and then along the inguinal ligament with interrupted 0 Surgilon sutures. The medial and  superior borders were anchored to the transverse abdominis aponeurosis a similar fashion. Toradol was placed into the wound. Nerves were returned to their bed and the external Bleich closed with a running 2-0 Vicryl suture. Scarpa's fascia was closed with a running 3-0 Vicryl suture and the skin closed with a running 4-0 Vicryl subcuticular suture. Benzoin, Steri-Strips, Telfa and Tegaderm dressing applied.  The patient tolerated the procedure well and was taken to recovery in stable condition.

## 2016-05-05 ENCOUNTER — Ambulatory Visit: Payer: Medicare HMO | Admitting: General Surgery

## 2016-05-05 MED ORDER — FUROSEMIDE 40 MG PO TABS: 20.0000 mg | ORAL_TABLET | Freq: Every day | ORAL | 0 refills | Status: DC

## 2016-05-05 MED ORDER — TRAMADOL HCL 50 MG PO TABS: 50.0000 mg | ORAL_TABLET | ORAL | 0 refills | Status: DC | PRN

## 2016-05-05 NOTE — Care Management Important Message (Signed)
Important Message  Patient Details  Name: Kathy Dominguez MRN: [id number] Date of Birth: 07-02-1928   Medicare Important Message Given:  N/A - LOS <3 / Initial given by admissions    Beverly Sessions, RN 05/05/2016, 10:35 AM

## 2016-05-05 NOTE — Final Progress Note (Signed)
AVSS.   Mild soreness this AM.  Lungs: Clear.  Cardio: RR..  ABD: Scaphoid, soft.  Wound: Scant old drainage.  BM yesterday prior to OR.  Voiding well.  Plan: D/C home.

## 2016-05-13 ENCOUNTER — Encounter: Payer: Self-pay | Admitting: General Surgery

## 2016-05-13 ENCOUNTER — Ambulatory Visit (INDEPENDENT_AMBULATORY_CARE_PROVIDER_SITE_OTHER): Payer: Medicare HMO | Admitting: General Surgery

## 2016-05-13 VITALS — BP 172/98 | HR 82 | Resp 12 | Ht 64.0 in | Wt 127.0 lb

## 2016-05-13 DIAGNOSIS — K4090 Unilateral inguinal hernia, without obstruction or gangrene, not specified as recurrent: Secondary | ICD-10-CM

## 2016-05-13 NOTE — Patient Instructions (Addendum)
Return as needed.Proper lifting techniques reviewed. 

## 2016-05-13 NOTE — Progress Notes (Signed)
[name], female   DOB: [birth year], 83 y.o.   MRN: [id number]  Chief Complaint  Patient presents with  . Routine Post Op    HPI Kathy Dominguez is a 83 y.o. female here today for her post op left inguinal hernia repair done on 05/04/2016. Patient states she is doing well. Moves her bowels daily.  HPI  Past Medical History:  Diagnosis Date  . Asthma   . Chronic systolic CHF (congestive heart failure) ([place])    a. 01/2015 Echo: EF 25-30%, sev diff HK, mild to mod MR, mildly dil LA, nl RV fxn, PASP 61 mmHg.  . Colon cancer ([place]) 2005  . Hypercholesterolemia   . Hypertension   . Hypertensive heart disease with CHF (congestive heart failure) ([place])   . NICM (nonischemic cardiomyopathy) ([place])    a. 01/2015 Echo: EF 25-30% sev diff HK;  b. 01/2015 Lexi MV: EF 19%, small defect of mild severity in apex - likely breast attenuation, no ischemia.  Marland Kitchen NSVT (nonsustained ventricular tachycardia) ([place])   . Osteoporosis     Past Surgical History:  Procedure Laterality Date  . ABDOMINAL HYSTERECTOMY    . BREAST BIOPSY Left 4/15`   BENIGN BREAST EPITHELIUM WITH NODULAR FIBROSIS.  Marland Kitchen INGUINAL HERNIA REPAIR Left 05/04/2016   Procedure: HERNIA REPAIR INGUINAL ADULT;  Surgeon: Robert Bellow, MD;  Location: [facility] ORS;  Service: General;  Laterality: Left;  . THYROIDECTOMY, PARTIAL  1956  . TUBAL LIGATION      Family History  Problem Relation Age of Onset  . Stroke Mother   . Colon cancer Father   . Breast cancer Daughter 49  . Breast cancer Cousin     Social History Social History  Substance Use Topics  . Smoking status: Never Smoker  . Smokeless tobacco: Never Used  . Alcohol use No    No Known Allergies  Current Outpatient Prescriptions  Medication Sig Dispense Refill  . albuterol (PROVENTIL HFA;VENTOLIN HFA) 108 (90 Base) MCG/ACT inhaler Inhale 2 puffs into the lungs every 6 (six) hours as needed for wheezing or shortness of breath. 1 Inhaler 2  . aspirin (ASPIRIN EC)  81 MG EC tablet Take 81 mg by mouth daily. Swallow whole.    . Cholecalciferol (VITAMIN D-3) 1000 UNITS CAPS Take 2,000 Units by mouth daily.    . furosemide (LASIX) 40 MG tablet Take 0.5 tablets (20 mg total) by mouth daily. 30 tablet 0  . metoprolol succinate (TOPROL XL) 25 MG 24 hr tablet Take 1 tablet (25 mg total) by mouth daily. 30 tablet 5  . sacubitril-valsartan (ENTRESTO) 49-51 MG Take 1 tablet by mouth 2 (two) times daily. 60 tablet 5  . spironolactone (ALDACTONE) 25 MG tablet Take 0.5 tablets (12.5 mg total) by mouth daily. 30 tablet 5   No current facility-administered medications for this visit.     Review of Systems Review of Systems  Constitutional: Negative.   Respiratory: Negative.   Cardiovascular: Negative.     Blood pressure (!) 172/98, pulse 82, resp. rate 12, height 5\' 4"  (1.626 m), weight 127 lb (57.6 kg), last menstrual period 02/21/1966.  Physical Exam Physical Exam  Constitutional: She is oriented to person, place, and time. She appears well-developed and well-nourished.  Abdominal:  Left inguinal hernia repair is intact and healing well.   Neurological: She is alert and oriented to person, place, and time.  Skin: Skin is warm and dry.      Assessment    No recurrent obstructive symptoms  post hernia reduction and repair.     Plan         Patient to return as needed.   HPI, Physical Exam, Assessment and Plan have been scribed under the direction and in the presence of Hervey Ard, MD.  Gaspar Cola, CMA   I have completed the exam and reviewed the above documentation for accuracy and completeness.  I agree with the above.  Haematologist has been used and any errors in dictation or transcription are unintentional.  Hervey Ard, M.D., F.A.C.S.  Robert Bellow 05/14/2016, 8:28 PM

## 2016-05-17 ENCOUNTER — Encounter: Payer: Self-pay | Admitting: Family

## 2016-05-17 ENCOUNTER — Ambulatory Visit: Payer: Medicare HMO | Attending: Family | Admitting: Family

## 2016-05-17 VITALS — BP 152/83 | HR 63 | Resp 20 | Ht 64.0 in | Wt 129.5 lb

## 2016-05-17 DIAGNOSIS — Z85038 Personal history of other malignant neoplasm of large intestine: Secondary | ICD-10-CM | POA: Insufficient documentation

## 2016-05-17 DIAGNOSIS — Z7982 Long term (current) use of aspirin: Secondary | ICD-10-CM | POA: Diagnosis not present

## 2016-05-17 DIAGNOSIS — Z79899 Other long term (current) drug therapy: Secondary | ICD-10-CM | POA: Insufficient documentation

## 2016-05-17 DIAGNOSIS — Z9851 Tubal ligation status: Secondary | ICD-10-CM | POA: Diagnosis not present

## 2016-05-17 DIAGNOSIS — Z90710 Acquired absence of both cervix and uterus: Secondary | ICD-10-CM | POA: Diagnosis not present

## 2016-05-17 DIAGNOSIS — I5022 Chronic systolic (congestive) heart failure: Secondary | ICD-10-CM | POA: Diagnosis not present

## 2016-05-17 DIAGNOSIS — I952 Hypotension due to drugs: Secondary | ICD-10-CM

## 2016-05-17 DIAGNOSIS — I959 Hypotension, unspecified: Secondary | ICD-10-CM | POA: Diagnosis not present

## 2016-05-17 DIAGNOSIS — I110 Hypertensive heart disease with heart failure: Secondary | ICD-10-CM | POA: Insufficient documentation

## 2016-05-17 DIAGNOSIS — Z823 Family history of stroke: Secondary | ICD-10-CM | POA: Diagnosis not present

## 2016-05-17 DIAGNOSIS — Z803 Family history of malignant neoplasm of breast: Secondary | ICD-10-CM | POA: Insufficient documentation

## 2016-05-17 NOTE — Progress Notes (Signed)
[name], female    DOB: Sep 19, 1928, 83 y.o.   MRN: [id number]  HPI  Kathy Dominguez is a 83 y/o female with a history of HTN, asthma, NSVT, hyperlipidemia, osteoporosis, hyperkalemia and chronic heart failure.  Last echo was done 01/29/16 and showed an EF of 35-30% along with mild/mod MR and mod/severely elevated PA pressure of 61 mm Hg.  Admitted 05/02/16 due to hernia repair. Was discharged home after 3 days. Previously admitted on 01/28/16 with new onset heart failure. IV diuresed for 5.4 L and cardiology consult was obtained.   She presents today with a chief complaint of a follow-up visit for her HF. She denies any fatigue, shortness of breath, swelling or weight gain. She says that she's sleeping well  Past Medical History:  Diagnosis Date  . Asthma   . Chronic systolic CHF (congestive heart failure) ([place])    a. 01/2015 Echo: EF 25-30%, sev diff HK, mild to mod MR, mildly dil LA, nl RV fxn, PASP 61 mmHg.  . Colon cancer ([place]) 2005  . Hypercholesterolemia   . Hypertension   . Hypertensive heart disease with CHF (congestive heart failure) ([place])   . NICM (nonischemic cardiomyopathy) ([place])    a. 01/2015 Echo: EF 25-30% sev diff HK;  b. 01/2015 Lexi MV: EF 19%, small defect of mild severity in apex - likely breast attenuation, no ischemia.  Marland Kitchen NSVT (nonsustained ventricular tachycardia) ([place])   . Osteoporosis    Past Surgical History:  Procedure Laterality Date  . ABDOMINAL HYSTERECTOMY    . BREAST BIOPSY Left 4/15`   BENIGN BREAST EPITHELIUM WITH NODULAR FIBROSIS.  Marland Kitchen INGUINAL HERNIA REPAIR Left 05/04/2016   Procedure: HERNIA REPAIR INGUINAL ADULT;  Surgeon: Robert Bellow, MD;  Location: [facility] ORS;  Service: General;  Laterality: Left;  . THYROIDECTOMY, PARTIAL  1956  . TUBAL LIGATION     Family History  Problem Relation Age of Onset  . Stroke Mother   . Colon cancer Father   . Breast cancer Daughter 4  . Breast cancer Cousin    Social History  Substance Use  Topics  . Smoking status: Never Smoker  . Smokeless tobacco: Never Used  . Alcohol use No   No Known Allergies Prior to Admission medications   Medication Sig Start Date End Date Taking? Authorizing Provider  acetaminophen (TYLENOL) 325 MG tablet Take 650 mg by mouth every 6 (six) hours as needed.   Yes Historical Provider, MD  albuterol (PROVENTIL HFA;VENTOLIN HFA) 108 (90 Base) MCG/ACT inhaler Inhale 2 puffs into the lungs every 6 (six) hours as needed for wheezing or shortness of breath. 12/04/15  Yes Einar Pheasant, MD  aspirin (ASPIRIN EC) 81 MG EC tablet Take 81 mg by mouth daily. Swallow whole.   Yes Historical Provider, MD  Cholecalciferol (VITAMIN D-3) 1000 UNITS CAPS Take 2,000 Units by mouth daily.   Yes Historical Provider, MD  furosemide (LASIX) 40 MG tablet Take 0.5 tablets (20 mg total) by mouth daily. 05/05/16  Yes Robert Bellow, MD  metoprolol succinate (TOPROL XL) 25 MG 24 hr tablet Take 1 tablet (25 mg total) by mouth daily. 02/13/16  Yes Minna Merritts, MD  sacubitril-valsartan (ENTRESTO) 49-51 MG Take 1 tablet by mouth 2 (two) times daily. 04/14/16  Yes Alisa Graff, FNP  spironolactone (ALDACTONE) 25 MG tablet Take 0.5 tablets (12.5 mg total) by mouth daily. 02/13/16  Yes Minna Merritts, MD     Review of Systems  Constitutional: Negative for  appetite change and fatigue.  HENT: Positive for rhinorrhea. Negative for congestion and sore throat.   Eyes: Negative.   Respiratory: Negative for cough, chest tightness and shortness of breath.   Cardiovascular: Negative for chest pain, palpitations and leg swelling.  Gastrointestinal: Negative for abdominal distention and abdominal pain.  Endocrine: Negative.   Genitourinary: Negative.   Musculoskeletal: Negative for back pain and neck pain.  Skin: Negative.   Allergic/Immunologic: Negative.   Neurological: Negative for dizziness and light-headedness.  Hematological: Negative for adenopathy. Does not bruise/bleed  easily.  Psychiatric/Behavioral: Negative for dysphoric mood and sleep disturbance (sleeping well). The patient is not nervous/anxious.    Vitals:   05/17/16 1016  BP: (!) 152/83  Pulse: 63  Resp: 20  SpO2: 98%  Weight: 129 lb 8 oz (58.7 kg)  Height: 5\' 4"  (1.626 m)   Wt Readings from Last 3 Encounters:  05/17/16 129 lb 8 oz (58.7 kg)  05/13/16 127 lb (57.6 kg)  05/04/16 129 lb (58.5 kg)   Lab Results  Component Value Date   CREATININE 0.76 05/02/2016   CREATININE 0.73 03/28/2016   CREATININE 0.88 02/26/2016    Physical Exam  Constitutional: She is oriented to person, place, and time. She appears well-developed and well-nourished.  HENT:  Head: Normocephalic and atraumatic.  Neck: Normal range of motion. Neck supple. No JVD present.  Cardiovascular: Normal rate and regular rhythm.   Pulmonary/Chest: Effort normal. She has no wheezes. She has no rales.  Abdominal: Soft. She exhibits no distension. There is no tenderness.  Musculoskeletal: She exhibits no edema or tenderness.  Neurological: She is alert and oriented to person, place, and time.  Skin: Skin is warm and dry.  Psychiatric: She has a normal mood and affect. Her behavior is normal. Thought content normal.  Nursing note and vitals reviewed.    Assessment & Plan:  1: Chronic heart failure with reduced ejection fraction- - NYHA class I - euvolemic - already weighing daily. Instructed to call for an overnight weight gain of >2 pounds or a weekly weight gain of >5 pounds - continues to not add salt to her food.She does like to occasionally go out to eat and we discussed how to be careful with her sodium intake the other meals leading up to her going out to eat so that she could enjoy her meal.  - continues to take entresto BID   - saw cardiologist Rockey Situ) 03/15/16 and returns May 2018 - most recent labs drawn on 05/02/16 shows potassium level 3.9 and GFR >60  2: Hypotension- - BP looks good today - saw PCP  Nicki Reaper) on 04/07/16 and returns May 2018  Patient opts to not make a return appointment at this time. Advised patient that she could call at anytime to make an appointment.

## 2016-05-17 NOTE — Patient Instructions (Signed)
Continue weighing daily and call for an overnight weight gain of > 2 pounds or a weekly weight gain of >5 pounds. 

## 2016-05-18 NOTE — Discharge Summary (Signed)
Physician Discharge Summary  [name] MRN: [id number] DOB/AGE: [birth year] 83 y.o.  Admit date: 05/02/2016 Discharge date: 05/18/2016  Admission Diagnoses: Incarcerated inguinal hernia.  Discharge Diagnoses:  Active Problems:   SBO (small bowel obstruction) [facility])   Discharged Condition: good  Hospital Course: The patient was admitted through the emergency room where a left inguinal hernia was reduced. She was brought to operating room the following day for repair. She tolerated this well and was able to be discharged home on postoperative day 2.  Consults: None  Significant Diagnostic Studies: None.  Treatments: surgery: Left inguinal hernia repair with atrium plug and patch.  Discharge Exam: Blood pressure (!) 144/67, pulse 69, temperature 98.2 F (36.8 C), temperature source Oral, resp. rate 16, height 5\' 4"  (1.626 m), weight 129 lb (58.5 kg), last menstrual period 02/21/1966, SpO2 (!) 89 %. General appearance: alert Resp: clear to auscultation bilaterally Cardio: regular rate and rhythm, S1, S2 normal, no murmur, click, rub or gallop GI: soft, non-tender; bowel sounds normal; no masses,  no organomegaly Incision/Wound: Incision clean and dry.  Disposition: 01-Home or Self Care  Discharge Instructions    Diet - low sodium heart healthy    Complete by:  As directed    Discharge instructions    Complete by:  As directed    OK to shower at any time.  May remove outer dressing in two days.  Apply ice to area intermittently today for comfort, tomorrow start using heat.  Tylenol: If needed for soreness.  Ultram (tramadol): If needed for pain.  Laxative of choice if needed.  No lifting over 10 pounds.  No driving until pain free.   Increase activity slowly    Complete by:  As directed      Allergies as of 05/05/2016   No Known Allergies     Medication List    TAKE these medications   albuterol 108 (90 Base) MCG/ACT inhaler Commonly known as:   PROVENTIL HFA;VENTOLIN HFA Inhale 2 puffs into the lungs every 6 (six) hours as needed for wheezing or shortness of breath.   aspirin EC 81 MG EC tablet Generic drug:  aspirin Take 81 mg by mouth daily. Swallow whole.   furosemide 40 MG tablet Commonly known as:  LASIX Take 0.5 tablets (20 mg total) by mouth daily.   metoprolol succinate 25 MG 24 hr tablet Commonly known as:  TOPROL XL Take 1 tablet (25 mg total) by mouth daily.   sacubitril-valsartan 49-51 MG Commonly known as:  ENTRESTO Take 1 tablet by mouth 2 (two) times daily.   spironolactone 25 MG tablet Commonly known as:  ALDACTONE Take 0.5 tablets (12.5 mg total) by mouth daily.   Vitamin D-3 1000 units Caps Take 2,000 Units by mouth daily.      Follow-up Information    Chrisann Melaragno, Forest Gleason, MD. Go on 05/13/2016.   Specialties:  General Surgery, Radiology Why:  @9 :45am Contact information: [address] [place] [address] [phone]           Signed: Robert Bellow 05/18/2016, 8:39 PM

## 2016-05-24 DIAGNOSIS — R0609 Other forms of dyspnea: Secondary | ICD-10-CM | POA: Diagnosis not present

## 2016-06-09 ENCOUNTER — Other Ambulatory Visit: Payer: Self-pay | Admitting: Internal Medicine

## 2016-06-09 ENCOUNTER — Telehealth: Payer: Self-pay | Admitting: Radiology

## 2016-06-09 DIAGNOSIS — R945 Abnormal results of liver function studies: Secondary | ICD-10-CM

## 2016-06-09 DIAGNOSIS — R739 Hyperglycemia, unspecified: Secondary | ICD-10-CM

## 2016-06-09 DIAGNOSIS — E7800 Pure hypercholesterolemia, unspecified: Secondary | ICD-10-CM

## 2016-06-09 DIAGNOSIS — R7989 Other specified abnormal findings of blood chemistry: Secondary | ICD-10-CM

## 2016-06-09 DIAGNOSIS — I10 Essential (primary) hypertension: Secondary | ICD-10-CM

## 2016-06-09 NOTE — Telephone Encounter (Signed)
Pt coming in for labs tomorrow, please place future orders. Thank you.  

## 2016-06-09 NOTE — Progress Notes (Signed)
Order placed for labs.

## 2016-06-09 NOTE — Telephone Encounter (Signed)
Orders placed for labs

## 2016-06-11 ENCOUNTER — Other Ambulatory Visit (INDEPENDENT_AMBULATORY_CARE_PROVIDER_SITE_OTHER): Payer: Medicare HMO

## 2016-06-11 DIAGNOSIS — R945 Abnormal results of liver function studies: Secondary | ICD-10-CM

## 2016-06-11 DIAGNOSIS — R7989 Other specified abnormal findings of blood chemistry: Secondary | ICD-10-CM

## 2016-06-11 DIAGNOSIS — I10 Essential (primary) hypertension: Secondary | ICD-10-CM

## 2016-06-11 DIAGNOSIS — E7800 Pure hypercholesterolemia, unspecified: Secondary | ICD-10-CM | POA: Diagnosis not present

## 2016-06-11 DIAGNOSIS — R739 Hyperglycemia, unspecified: Secondary | ICD-10-CM

## 2016-06-11 LAB — BASIC METABOLIC PANEL
BUN: 16 mg/dL (ref 6–23)
CHLORIDE: 104 meq/L (ref 96–112)
CO2: 34 mEq/L — ABNORMAL HIGH (ref 19–32)
Calcium: 9.4 mg/dL (ref 8.4–10.5)
Creatinine, Ser: 0.79 mg/dL (ref 0.40–1.20)
GFR: 88.69 mL/min (ref >60.00)
Glucose, Bld: 105 mg/dL — ABNORMAL HIGH (ref 70–99)
POTASSIUM: 4.8 meq/L (ref 3.5–5.1)
Sodium: 141 mEq/L (ref 135–145)

## 2016-06-11 LAB — HEPATIC FUNCTION PANEL
ALT: 11 U/L (ref 0–35)
AST: 17 U/L (ref 0–37)
Albumin: 4.1 g/dL (ref 3.5–5.2)
Alkaline Phosphatase: 60 U/L (ref 39–117)
Bilirubin, Direct: 0.1 mg/dL (ref 0.0–0.3)
TOTAL PROTEIN: 6.8 g/dL (ref 6.0–8.3)
Total Bilirubin: 0.4 mg/dL (ref 0.2–1.2)

## 2016-06-11 LAB — LIPID PANEL
CHOL/HDL RATIO: 3
CHOLESTEROL: 192 mg/dL (ref 0–200)
HDL: 75.9 mg/dL (ref >39.00)
LDL CALC: 107 mg/dL — AB (ref 0–99)
NonHDL: 115.74
TRIGLYCERIDES: 45 mg/dL (ref 0.0–149.0)
VLDL: 9 mg/dL (ref 0.0–40.0)

## 2016-06-11 LAB — HEMOGLOBIN A1C: HEMOGLOBIN A1C: 5.8 % (ref 4.6–6.5)

## 2016-06-12 NOTE — Progress Notes (Signed)
Cardiology Office Note  Date:  06/14/2016   ID:  Kathy Dominguez, DOB 1928-04-16, MRN [id number]  PCP:  Einar Pheasant, MD   Chief Complaint  Patient presents with  . other    3 month follow up. Meds reviewed by the pt. verbally. "doing well."     HPI:   83 y/o ? with a h/o  HTN,   [facility] 01/28/2016 for shortness of breath,  diagnosed with acute systolic CHF  ejection fraction 25-35%, nonischemic myoview showing no ischemic,  who presents for follow-up  of her chronic systolic CHF   In follow-up she reports that she feels well,  No significant weight gain Weight continues to be 127 pounds at home  She does not check her blood pressure at home Review of the last several office visits in April and May show systolic pressure typically 150 She otherwise feels well, active Presenting with her son today from [place] She has a scale, weighs daily Consistent with her medications  Denies chest pain, palpitations, dyspnea, pnd, orthopnea, n, v, dizziness, syncope, edema, weight gain, or early satiety.  Lab work reviewed Stable renal function  Denies leg swelling, abdominal bloating, shortness of breath    PMH:   has a past medical history of Asthma; Chronic systolic CHF (congestive heart failure) ([place]); Colon cancer ([place]) (2005); Hypercholesterolemia; Hypertension; Hypertensive heart disease with CHF (congestive heart failure) ([place]); NICM (nonischemic cardiomyopathy) ([place]); NSVT (nonsustained ventricular tachycardia) ([place]); and Osteoporosis.  PSH:    Past Surgical History:  Procedure Laterality Date  . ABDOMINAL HYSTERECTOMY    . BREAST BIOPSY Left 4/15`   BENIGN BREAST EPITHELIUM WITH NODULAR FIBROSIS.  Marland Kitchen INGUINAL HERNIA REPAIR Left 05/04/2016   Procedure: HERNIA REPAIR INGUINAL ADULT;  Surgeon: Robert Bellow, MD;  Location: [facility] ORS;  Service: General;  Laterality: Left;  . THYROIDECTOMY, PARTIAL  1956  . TUBAL LIGATION      Current Outpatient Prescriptions   Medication Sig Dispense Refill  . acetaminophen (TYLENOL) 325 MG tablet Take 650 mg by mouth every 6 (six) hours as needed.    Marland Kitchen albuterol (PROVENTIL HFA;VENTOLIN HFA) 108 (90 Base) MCG/ACT inhaler Inhale 2 puffs into the lungs every 6 (six) hours as needed for wheezing or shortness of breath. 1 Inhaler 2  . aspirin (ASPIRIN EC) 81 MG EC tablet Take 81 mg by mouth daily. Swallow whole.    . Cholecalciferol (VITAMIN D-3) 1000 UNITS CAPS Take 2,000 Units by mouth daily.    . furosemide (LASIX) 40 MG tablet Take 0.5 tablets (20 mg total) by mouth daily. 30 tablet 0  . metoprolol succinate (TOPROL XL) 25 MG 24 hr tablet Take 1 tablet (25 mg total) by mouth daily. 30 tablet 5  . sacubitril-valsartan (ENTRESTO) 49-51 MG Take 1 tablet by mouth 2 (two) times daily. 60 tablet 5  . spironolactone (ALDACTONE) 25 MG tablet Take 0.5 tablets (12.5 mg total) by mouth daily. 30 tablet 5   No current facility-administered medications for this visit.      Allergies:   Patient has no known allergies.   Social History:  The patient  reports that she has never smoked. She has never used smokeless tobacco. She reports that she does not drink alcohol or use drugs.   Family History:   family history includes Breast cancer in her cousin; Breast cancer (age of onset: 17) in her daughter; Colon cancer in her father; Stroke in her mother.    Review of Systems: Review of Systems  Constitutional: Negative.  Respiratory: Negative.   Cardiovascular: Negative.   Gastrointestinal: Negative.   Musculoskeletal: Negative.   Neurological: Negative.   Psychiatric/Behavioral: Negative.   All other systems reviewed and are negative.    PHYSICAL EXAM: VS:  BP (!) 160/80 (BP Location: Left Arm, Patient Position: Sitting, Cuff Size: Normal)   Pulse 66   Ht 5\' 1"  (1.549 m)   Wt 128 lb 8 oz (58.3 kg)   LMP 02/21/1966   BMI 24.28 kg/m  , BMI Body mass index is 24.28 kg/m. GEN: Well nourished, well developed, in no  acute distress  HEENT: normal  Neck: no JVD, carotid bruits, or masses Cardiac: RRR; no murmurs, rubs, or gallops,no edema  Respiratory:  clear to auscultation bilaterally, normal work of breathing GI: soft, nontender, nondistended, + BS MS: no deformity or atrophy  Skin: warm and dry, no rash Neuro:  Strength and sensation are intact Psych: euthymic mood, full affect    Recent Labs: 01/29/2016: TSH 0.588 01/30/2016: B Natriuretic Peptide 633.0 02/01/2016: Magnesium 2.1 05/02/2016: Hemoglobin 13.1; Platelets 191 06/11/2016: ALT 11; BUN 16; Creatinine, Ser 0.79; Potassium 4.8; Sodium 141    Lipid Panel Lab Results  Component Value Date   CHOL 192 06/11/2016   HDL 75.90 06/11/2016   LDLCALC 107 (H) 06/11/2016   TRIG 45.0 06/11/2016      Wt Readings from Last 3 Encounters:  06/14/16 128 lb 8 oz (58.3 kg)  05/17/16 129 lb 8 oz (58.7 kg)  05/13/16 127 lb (57.6 kg)       ASSESSMENT AND PLAN:  Chronic systolic heart failure (HCC) Weight is stable Lasix was cut down issues bothered by frequent urination Stable renal function Denies excessive fluid or salt intake  Essential hypertension Blood pressure is higher April and May Discussed with her and her family in detail Recommended she buy blood pressure cuff If systolic pressure runs high over the next week, would recommend she increase her entresto dose up to 99/101 mg twice a day. Extra samples provided today of her current entersto to supplement  Hypercholesterolemia Currently not on a cholesterol medication   Total encounter time more than 25 minutes  Greater than 50% was spent in counseling and coordination of care with the patient   Disposition:   F/U  echocardiogram in 3 months6 months   No orders of the defined types were placed in this encounter.    Signed, Esmond Plants, M.D., Ph.D. 06/14/2016  [place], [place]

## 2016-06-14 ENCOUNTER — Ambulatory Visit (INDEPENDENT_AMBULATORY_CARE_PROVIDER_SITE_OTHER): Payer: Medicare HMO | Admitting: Cardiovascular Disease

## 2016-06-14 ENCOUNTER — Encounter: Payer: Self-pay | Admitting: Cardiovascular Disease

## 2016-06-14 VITALS — BP 160/80 | HR 66 | Ht 61.0 in | Wt 128.5 lb

## 2016-06-14 DIAGNOSIS — I5022 Chronic systolic (congestive) heart failure: Secondary | ICD-10-CM

## 2016-06-14 DIAGNOSIS — E7800 Pure hypercholesterolemia, unspecified: Secondary | ICD-10-CM | POA: Diagnosis not present

## 2016-06-14 DIAGNOSIS — I10 Essential (primary) hypertension: Secondary | ICD-10-CM | POA: Diagnosis not present

## 2016-06-14 NOTE — Addendum Note (Signed)
Addended by: Stana Bunting on: 06/14/2016 08:27 AM   Modules accepted: Orders

## 2016-06-14 NOTE — Patient Instructions (Addendum)
Medication Instructions:   No medication changes made Please monitor blood pressure   If it runs high, We  will increase the entresto up to 99/101 mg twice a day  Labwork:  No new labs needed  Testing/Procedures:  Echocardiogram in 3 months   I recommend watching educational videos on topics of interest to you at:       [email or web address].com  Enter code: HEARTCARE    Follow-Up: It was a pleasure seeing you in the office today. Please call us if you have new issues that need to be addressed before your next appt.  [phone]  Your physician wants you to follow-up in: 6 months.  You will receive a reminder letter in the mail two months in advance. If you don't receive a letter, please call our office to schedule the follow-up appointment.  If you need a refill on your cardiac medications before your next appointment, please call your pharmacy.

## 2016-06-15 ENCOUNTER — Ambulatory Visit (INDEPENDENT_AMBULATORY_CARE_PROVIDER_SITE_OTHER): Payer: Medicare HMO | Admitting: Internal Medicine

## 2016-06-15 ENCOUNTER — Encounter: Payer: Self-pay | Admitting: Internal Medicine

## 2016-06-15 VITALS — BP 140/80 | HR 65 | Temp 98.6°F | Resp 12 | Ht 64.0 in | Wt 127.6 lb

## 2016-06-15 DIAGNOSIS — J4520 Mild intermittent asthma, uncomplicated: Secondary | ICD-10-CM | POA: Diagnosis not present

## 2016-06-15 DIAGNOSIS — R739 Hyperglycemia, unspecified: Secondary | ICD-10-CM

## 2016-06-15 DIAGNOSIS — R7989 Other specified abnormal findings of blood chemistry: Secondary | ICD-10-CM

## 2016-06-15 DIAGNOSIS — I5022 Chronic systolic (congestive) heart failure: Secondary | ICD-10-CM

## 2016-06-15 DIAGNOSIS — R945 Abnormal results of liver function studies: Secondary | ICD-10-CM | POA: Diagnosis not present

## 2016-06-15 DIAGNOSIS — I10 Essential (primary) hypertension: Secondary | ICD-10-CM

## 2016-06-15 DIAGNOSIS — E7800 Pure hypercholesterolemia, unspecified: Secondary | ICD-10-CM | POA: Diagnosis not present

## 2016-06-15 DIAGNOSIS — I110 Hypertensive heart disease with heart failure: Secondary | ICD-10-CM | POA: Diagnosis not present

## 2016-06-15 DIAGNOSIS — C189 Malignant neoplasm of colon, unspecified: Secondary | ICD-10-CM | POA: Diagnosis not present

## 2016-06-15 NOTE — Progress Notes (Signed)
Pre-visit discussion using our clinic review tool. No additional management support is needed unless otherwise documented below in the visit note.  

## 2016-06-15 NOTE — Progress Notes (Signed)
Patient ID: Kathy Brazee, female   DOB: [birth year], 83 y.o.   MRN: [id number]   Subjective:    [name], female    DOB: May 28, 1928, 83 y.o.   MRN: [id number]  HPI  Patient here for a scheduled follow up.  She is under some increased stress recently.  Discussed with her today.  She feels she is handling things relatively well.  Enjoys keeping her grandchildren.  States she feels better.  Breathing better.  No chest pain.  No acid reflux.  Due to see Dr Rockey Situ 06/14/16.  Saw Dr Raul Del 05/24/16.  S/p left inguinal hernia repair.  Doing well.  No abdominal pain.  Bowels moving.      Past Medical History:  Diagnosis Date  . Asthma   . Chronic systolic CHF (congestive heart failure) ([place])    a. 01/2015 Echo: EF 25-30%, sev diff HK, mild to mod MR, mildly dil LA, nl RV fxn, PASP 61 mmHg.  . Colon cancer ([place]) 2005  . Hypercholesterolemia   . Hypertension   . Hypertensive heart disease with CHF (congestive heart failure) ([place])   . NICM (nonischemic cardiomyopathy) ([place])    a. 01/2015 Echo: EF 25-30% sev diff HK;  b. 01/2015 Lexi MV: EF 19%, small defect of mild severity in apex - likely breast attenuation, no ischemia.  Marland Kitchen NSVT (nonsustained ventricular tachycardia) ([place])   . Osteoporosis    Past Surgical History:  Procedure Laterality Date  . ABDOMINAL HYSTERECTOMY    . BREAST BIOPSY Left 4/15`   BENIGN BREAST EPITHELIUM WITH NODULAR FIBROSIS.  Marland Kitchen INGUINAL HERNIA REPAIR Left 05/04/2016   Procedure: HERNIA REPAIR INGUINAL ADULT;  Surgeon: Robert Bellow, MD;  Location: [facility] ORS;  Service: General;  Laterality: Left;  . THYROIDECTOMY, PARTIAL  1956  . TUBAL LIGATION     Family History  Problem Relation Age of Onset  . Stroke Mother   . Colon cancer Father   . Breast cancer Daughter 49  . Breast cancer Cousin    Social History   Social History  . Marital status: Widowed    Spouse name: N/A  . Number of children: N/A  . Years of education: N/A   Social History Main  Topics  . Smoking status: Never Smoker  . Smokeless tobacco: Never Used  . Alcohol use No  . Drug use: No  . Sexual activity: Not Asked   Other Topics Concern  . None   Social History Narrative  . None    Outpatient Encounter Prescriptions as of 06/15/2016  Medication Sig  . acetaminophen (TYLENOL) 325 MG tablet Take 650 mg by mouth every 6 (six) hours as needed.  Marland Kitchen albuterol (PROVENTIL HFA;VENTOLIN HFA) 108 (90 Base) MCG/ACT inhaler Inhale 2 puffs into the lungs every 6 (six) hours as needed for wheezing or shortness of breath.  Marland Kitchen aspirin (ASPIRIN EC) 81 MG EC tablet Take 81 mg by mouth daily. Swallow whole.  . Cholecalciferol (VITAMIN D-3) 1000 UNITS CAPS Take 2,000 Units by mouth daily.  . furosemide (LASIX) 40 MG tablet Take 0.5 tablets (20 mg total) by mouth daily.  . metoprolol succinate (TOPROL XL) 25 MG 24 hr tablet Take 1 tablet (25 mg total) by mouth daily.  . sacubitril-valsartan (ENTRESTO) 49-51 MG Take 1 tablet by mouth 2 (two) times daily.  Marland Kitchen spironolactone (ALDACTONE) 25 MG tablet Take 0.5 tablets (12.5 mg total) by mouth daily.   No facility-administered encounter medications on file as of 06/15/2016.     Review  of Systems  Constitutional: Negative for appetite change and unexpected weight change.  HENT: Negative for congestion and sinus pressure.   Respiratory: Negative for cough, chest tightness and shortness of breath.   Cardiovascular: Negative for chest pain, palpitations and leg swelling.  Gastrointestinal: Negative for abdominal pain, diarrhea, nausea and vomiting.  Genitourinary: Negative for difficulty urinating and dysuria.  Musculoskeletal: Negative for back pain and joint swelling.  Skin: Negative for color change and rash.  Neurological: Negative for dizziness, light-headedness and headaches.  Psychiatric/Behavioral: Negative for agitation and dysphoric mood.       Objective:     Blood pressure rechecked b y me:  140/72  Physical Exam    Constitutional: She appears well-developed and well-nourished. No distress.  HENT:  Nose: Nose normal.  Mouth/Throat: Oropharynx is clear and moist.  Neck: Neck supple. No thyromegaly present.  Cardiovascular: Normal rate and regular rhythm.   Pulmonary/Chest: Breath sounds normal. No respiratory distress. She has no wheezes.  Abdominal: Soft. Bowel sounds are normal. There is no tenderness.  Musculoskeletal: She exhibits no edema or tenderness.  Lymphadenopathy:    She has no cervical adenopathy.  Skin: No rash noted. No erythema.  Psychiatric: She has a normal mood and affect. Her behavior is normal.    BP 140/80 (BP Location: Left Arm, Patient Position: Sitting, Cuff Size: Normal)   Pulse 65   Temp 98.6 F (37 C) (Oral)   Resp 12   Ht 5\' 4"  (1.626 m)   Wt 127 lb 9.6 oz (57.9 kg)   LMP 02/21/1966   SpO2 92%   BMI 21.90 kg/m  Wt Readings from Last 3 Encounters:  06/15/16 127 lb 9.6 oz (57.9 kg)  06/14/16 128 lb 8 oz (58.3 kg)  05/17/16 129 lb 8 oz (58.7 kg)     Lab Results  Component Value Date   WBC 4.6 05/02/2016   HGB 13.1 05/02/2016   HCT 39.7 05/02/2016   PLT 191 05/02/2016   GLUCOSE 105 (H) 06/11/2016   CHOL 192 06/11/2016   TRIG 45.0 06/11/2016   HDL 75.90 06/11/2016   LDLDIRECT 128.3 02/08/2013   LDLCALC 107 (H) 06/11/2016   ALT 11 06/11/2016   AST 17 06/11/2016   NA 141 06/11/2016   K 4.8 06/11/2016   CL 104 06/11/2016   CREATININE 0.79 06/11/2016   BUN 16 06/11/2016   CO2 34 (H) 06/11/2016   TSH 0.588 01/29/2016   INR 1.0 12/09/2015   HGBA1C 5.8 06/11/2016    Ct Abdomen Pelvis W Contrast  Result Date: 05/03/2016 CLINICAL DATA:  83 y/o F; left lower quadrant and mid abdominal pain. EXAM: CT ABDOMEN AND PELVIS WITH CONTRAST TECHNIQUE: Multidetector CT imaging of the abdomen and pelvis was performed using the standard protocol following bolus administration of intravenous contrast. CONTRAST:  86mL ISOVUE-300 IOPAMIDOL (ISOVUE-300) INJECTION 61%  COMPARISON:  03/28/2016 CT of abdomen and pelvis. FINDINGS: Lower chest: Mild cardiomegaly.  Small hiatal hernia. Hepatobiliary: Scattered subcentimeter lucencies throughout the liver are well-circumscribed and likely represent cysts. Normal gallbladder. No intra or extrahepatic biliary ductal dilatation. Pancreas: Probable pancreas divisum.  No acute inflammatory changes. Spleen: Normal in size without focal abnormality. Adrenals/Urinary Tract: Normal adrenal glands. Left kidney upper pole subcentimeter cyst. No other focal kidney lesion identified. No hydronephrosis. Normal bladder. Stomach/Bowel: Dilated small bowel with transition point in the left hemipelvis (series 2, image 60). Rectosigmoid postsurgical changes are patent. Normal colon. Normal appendix. Vascular/Lymphatic: Aortic atherosclerosis. No enlarged abdominal or pelvic lymph nodes. Reproductive: Status post  hysterectomy. No adnexal masses. Other: No herniation of small bowel into the left inguinal canal. Musculoskeletal: Stable grade 1 L3-4 and L4-5 anterolisthesis. Stable prominent trabecula of the L4 vertebral body probably representing hemangioma. Moderate right and mild left hip osteoarthrosis. IMPRESSION: 1. Dilated small bowel loops with transition in the left hemipelvis probably representing obstruction. 2. Mild cardiomegaly. 3. Small hiatal hernia. 4. Probable pancreas divisum. 5. Aortic atherosclerosis. Electronically Signed   By: Kristine Garbe M.D.   On: 05/03/2016 01:27   Dg Abd 2 Views  Result Date: 05/03/2016 CLINICAL DATA:  Acute onset of generalized abdominal pain. Initial encounter. EXAM: ABDOMEN - 2 VIEW COMPARISON:  CT of the abdomen and pelvis performed today at 12:54 a.m. FINDINGS: The visualized bowel gas pattern is unremarkable. Scattered air and stool filled loops of colon are seen; no abnormal dilatation of small bowel loops is seen to suggest small bowel obstruction. No free intra-abdominal air is identified,  though evaluation for free air is limited on a single supine view. Postoperative change is noted about the upper pelvis. Contrast is noted at the renal collecting systems and bladder. The visualized osseous structures are within normal limits; the sacroiliac joints are unremarkable in appearance. The visualized lung bases are essentially clear. IMPRESSION: Unremarkable bowel gas pattern; no free intra-abdominal air seen. Small to moderate amount of stool noted in the colon. Electronically Signed   By: Garald Balding M.D.   On: 05/03/2016 03:30       Assessment & Plan:   Problem List Items Addressed This Visit    Abnormal liver function tests    Follow liver panel.        Asthma    Breathing better.  Followed by Dr Raul Del.        Chronic systolic heart failure (HCC) (Chronic)    EF 25-30%.  Currently doing well.  Continue f/u with Dr Rockey Situ.  Feels better.        Colon adenocarcinoma ([place])    Colonoscopy 05/2012.  CEA 12/01/15 - 1.5.  Follow.        Essential hypertension    Blood pressure as outlined.  Same medication regimen.  Follow pressures.  Follow metabolic panel.        Relevant Orders   Basic metabolic panel   Hypercholesterolemia    Low cholesterol diet and exercise.  Follow lipid panel.        Relevant Orders   Hepatic function panel   Lipid panel   Hypertensive heart disease with CHF (congestive heart failure) ([place])    Followed by cardiology.  Stable on current regimen.  Follow.         Other Visit Diagnoses    Hyperglycemia    -  Primary   Relevant Orders   Hemoglobin A1c       Einar Pheasant, MD

## 2016-06-18 ENCOUNTER — Telehealth: Payer: Self-pay | Admitting: Cardiovascular Disease

## 2016-06-18 NOTE — Telephone Encounter (Signed)
No answer on cell.   Left detailed message on pt's home number (ok per DPR) regarding Dr. Donivan Scull recommendation on AVS and per notes.  "If systolic pressure runs high over the next week, would recommend she increase her entresto dose up to 99/101 mg twice a day. Extra samples provided today of her current entersto to supplement" Suggested she utilized the on-call PA this weekend if further questions.

## 2016-06-18 NOTE — Telephone Encounter (Signed)
Pt calling in her BP   06/15/16: 140/80 06/16/16: 140/85 06/18/16 155/95  Was told to call us for having such high BP  She saw Dr Rockey Situ in office on Monday  Please advise.

## 2016-06-19 ENCOUNTER — Encounter: Payer: Self-pay | Admitting: Internal Medicine

## 2016-06-19 NOTE — Assessment & Plan Note (Signed)
Low cholesterol diet and exercise.  Follow lipid panel.   

## 2016-06-19 NOTE — Assessment & Plan Note (Signed)
Follow liver panel.  

## 2016-06-19 NOTE — Assessment & Plan Note (Signed)
EF 25-30%.  Currently doing well.  Continue f/u with Dr Rockey Situ.  Feels better.

## 2016-06-19 NOTE — Assessment & Plan Note (Signed)
Breathing better.  Followed by Dr Raul Del.

## 2016-06-19 NOTE — Assessment & Plan Note (Signed)
Colonoscopy 05/2012.  CEA 12/01/15 - 1.5.  Follow.   

## 2016-06-19 NOTE — Assessment & Plan Note (Signed)
Followed by cardiology.  Stable on current regimen.  Follow.   

## 2016-06-19 NOTE — Assessment & Plan Note (Signed)
Blood pressure as outlined.  Same medication regimen.  Follow pressures.  Follow metabolic panel.  

## 2016-06-23 MED ORDER — SACUBITRIL-VALSARTAN 97-103 MG PO TABS: 1.0000 | ORAL_TABLET | Freq: Two times a day (BID) | ORAL | 6 refills | Status: DC

## 2016-06-23 NOTE — Telephone Encounter (Signed)
Spoke with patient at length and she reports that she increased to Entresto 49-51 mg 2 tablets twice a day and blood pressure was 158/70 on Saturday and then on Tuesday 140/80 right arm and 130/78 Left arm. She reports that she did miss a afternoon dose on Sunday due to a late church service so she was concerned about taking it that late. Instructed her to continue monitoring blood pressures and that I would send in prescription for the higher dose of entresto which is 97-103 mg and once she finishes her current bottle and picks that new bottle up she can go to 1 tablet twice a day. She verbalized understanding of our conversation, agreement with plan, and had no further questions at this time. She reports that she will go to have BP checked again today at the pharmacy and would call us back with that reading.

## 2016-06-23 NOTE — Telephone Encounter (Signed)
Patient went to drug store to have blood pressure checked and she was calling back with those numbers. Blood pressure was 150/85 in right arm and 144/82 in left arm. Instructed her to continue monitoring her blood pressures and to call back if they stay consistently greater than 140/80 with the increase in her medication. She verbalized understanding of our conversation, agreement with plan, and had no further questions at this time.

## 2016-06-29 NOTE — Telephone Encounter (Signed)
Pt calling stating she is calling in her BP readings  06/28/16 178/98  06/29/16 146/70

## 2016-06-29 NOTE — Telephone Encounter (Signed)
Pt reports that her BP has remained elevated since med change. She does not have more readings to report, as she does not check at home. She goes to her local drug store for BP checks.

## 2016-07-02 ENCOUNTER — Telehealth: Payer: Self-pay | Admitting: Cardiovascular Disease

## 2016-07-02 NOTE — Telephone Encounter (Signed)
Please see previous note.

## 2016-07-02 NOTE — Telephone Encounter (Signed)
Bp Readings   06/30/16 Wednesday  146/86   07/01/16 Thursday  180/80  07/02/16 Friday   154/80

## 2016-07-02 NOTE — Telephone Encounter (Signed)
Kathy Dominguez   07/02/16 12:59 PM Note    Bp Readings   06/30/16 Wednesday      146/86   07/01/16 Thursday          180/80  07/02/16 Friday               154/80

## 2016-07-06 MED ORDER — ISOSORBIDE MONONITRATE ER 30 MG PO TB24: 30.0000 mg | ORAL_TABLET | Freq: Every day | ORAL | 3 refills | Status: DC

## 2016-07-06 NOTE — Telephone Encounter (Signed)
If blood pressure is still high Add isosorbide 30 mg daily Continue to watch pressures

## 2016-07-06 NOTE — Telephone Encounter (Signed)
Spoke with patient and reviewed with her Dr. Donivan Scull recommendations to add isosorbide mononitrate 30 mg once daily. Instructed her to continue monitoring her blood pressure readings and to give [place] a call if they continue to remain elevated. She verbalized understanding of our conversation, agreement with plan, and had no further questions at this time. Medication sent in to her pharmacy of choice.

## 2016-07-14 ENCOUNTER — Telehealth: Payer: Self-pay | Admitting: Cardiovascular Disease

## 2016-07-14 NOTE — Telephone Encounter (Signed)
Readings look good.  She will continue to monitor.

## 2016-07-14 NOTE — Telephone Encounter (Signed)
BP readings   07/07/16 124/70  07/10/16 138/70 07/14/16 144/80

## 2016-07-19 DIAGNOSIS — R208 Other disturbances of skin sensation: Secondary | ICD-10-CM | POA: Diagnosis not present

## 2016-07-19 DIAGNOSIS — L910 Hypertrophic scar: Secondary | ICD-10-CM | POA: Diagnosis not present

## 2016-07-19 DIAGNOSIS — R234 Changes in skin texture: Secondary | ICD-10-CM | POA: Diagnosis not present

## 2016-07-23 ENCOUNTER — Telehealth: Payer: Self-pay | Admitting: Cardiovascular Disease

## 2016-07-23 NOTE — Telephone Encounter (Signed)
Pt is calling with BP readings. 6/23- 130/68 6/25-126/78 6/28-110/66

## 2016-07-23 NOTE — Telephone Encounter (Signed)
Spoke with patient and let her know that those readings are much better. She said that it has been doing much better. Instructed her to continue monitoring and give [place] a call back if those blood pressures should become elevated.

## 2016-09-15 ENCOUNTER — Other Ambulatory Visit (INDEPENDENT_AMBULATORY_CARE_PROVIDER_SITE_OTHER): Payer: Medicare HMO

## 2016-09-15 DIAGNOSIS — E7800 Pure hypercholesterolemia, unspecified: Secondary | ICD-10-CM | POA: Diagnosis not present

## 2016-09-15 DIAGNOSIS — I10 Essential (primary) hypertension: Secondary | ICD-10-CM

## 2016-09-15 DIAGNOSIS — R739 Hyperglycemia, unspecified: Secondary | ICD-10-CM

## 2016-09-15 LAB — LIPID PANEL
CHOL/HDL RATIO: 3
Cholesterol: 198 mg/dL (ref 0–200)
HDL: 68.7 mg/dL (ref >39.00)
LDL Cholesterol: 120 mg/dL — ABNORMAL HIGH (ref 0–99)
NONHDL: 129.72
Triglycerides: 48 mg/dL (ref 0.0–149.0)
VLDL: 9.6 mg/dL (ref 0.0–40.0)

## 2016-09-15 LAB — BASIC METABOLIC PANEL
BUN: 22 mg/dL (ref 6–23)
CHLORIDE: 103 meq/L (ref 96–112)
CO2: 32 mEq/L (ref 19–32)
Calcium: 9.8 mg/dL (ref 8.4–10.5)
Creatinine, Ser: 0.96 mg/dL (ref 0.40–1.20)
GFR: 70.78 mL/min (ref >60.00)
Glucose, Bld: 102 mg/dL — ABNORMAL HIGH (ref 70–99)
POTASSIUM: 4.4 meq/L (ref 3.5–5.1)
SODIUM: 141 meq/L (ref 135–145)

## 2016-09-15 LAB — HEPATIC FUNCTION PANEL
ALK PHOS: 58 U/L (ref 39–117)
ALT: 12 U/L (ref 0–35)
AST: 18 U/L (ref 0–37)
Albumin: 4.4 g/dL (ref 3.5–5.2)
Bilirubin, Direct: 0.1 mg/dL (ref 0.0–0.3)
TOTAL PROTEIN: 7.2 g/dL (ref 6.0–8.3)
Total Bilirubin: 0.5 mg/dL (ref 0.2–1.2)

## 2016-09-15 LAB — HEMOGLOBIN A1C: Hgb A1c MFr Bld: 6.3 % (ref 4.6–6.5)

## 2016-09-17 ENCOUNTER — Telehealth: Payer: Self-pay | Admitting: Internal Medicine

## 2016-09-17 ENCOUNTER — Ambulatory Visit: Payer: Medicare HMO | Admitting: Internal Medicine

## 2016-09-17 NOTE — Telephone Encounter (Signed)
See lab results for further documentation.  

## 2016-09-17 NOTE — Telephone Encounter (Signed)
Pt returned office phone call. Please call her at home, [phone]. Please do not call cell.

## 2016-09-28 ENCOUNTER — Other Ambulatory Visit: Payer: Self-pay

## 2016-09-28 ENCOUNTER — Ambulatory Visit (INDEPENDENT_AMBULATORY_CARE_PROVIDER_SITE_OTHER): Payer: Medicare HMO

## 2016-09-28 ENCOUNTER — Telehealth: Payer: Self-pay | Admitting: Cardiovascular Disease

## 2016-09-28 DIAGNOSIS — I5022 Chronic systolic (congestive) heart failure: Secondary | ICD-10-CM

## 2016-09-28 DIAGNOSIS — I10 Essential (primary) hypertension: Secondary | ICD-10-CM | POA: Diagnosis not present

## 2016-09-28 DIAGNOSIS — E7800 Pure hypercholesterolemia, unspecified: Secondary | ICD-10-CM

## 2016-09-28 MED ORDER — METOPROLOL SUCCINATE ER 25 MG PO TB24: 25.0000 mg | ORAL_TABLET | Freq: Every day | ORAL | 3 refills | Status: DC

## 2016-09-28 NOTE — Telephone Encounter (Signed)
We can provide samples for her when she needs them. Not sure that she would qualify

## 2016-09-28 NOTE — Telephone Encounter (Signed)
°*  STAT* If patient is at the pharmacy, call can be transferred to refill team.   1. Which medications need to be refilled? (please list name of each medication and dose if known)   Metoprolol 25 mg po daily    2. Which pharmacy/location (including street and city if local pharmacy) is medication to be sent to? [facility]  3. Do they need a 30 day or 90 day supply? [place]

## 2016-09-28 NOTE — Telephone Encounter (Signed)
Requested Prescriptions   Signed Prescriptions Disp Refills  . metoprolol succinate (TOPROL XL) 25 MG 24 hr tablet 90 tablet 3    Sig: Take 1 tablet (25 mg total) by mouth daily.    Authorizing Provider: Minna Merritts    Ordering User: Janan Ridge

## 2016-09-28 NOTE — Telephone Encounter (Signed)
Patient came to office today for echo and wanted to know if she can assistance for cost of entresto   Patient given information to medication management  And is aware that she is in the donut hole and if she needs samples next month she can call our office .

## 2016-09-28 NOTE — Telephone Encounter (Signed)
Please advise pt due to cost of Entresto. Please see note below.

## 2016-09-28 NOTE — Telephone Encounter (Signed)
LMOVM

## 2016-09-29 NOTE — Telephone Encounter (Signed)
I spoke with pt and she mentioned she does not need samples at this time due to donut hole. She is currently paying 100+ dollars and mentioned she paid it this time but will not be able to in the future. She has Entresto patient assistance program application to fill out . She will fill out application and drop off asap. She is aware that if she happens to need samples we could help her until she is approved.

## 2016-10-01 ENCOUNTER — Telehealth: Payer: Self-pay | Admitting: Cardiovascular Disease

## 2016-10-01 NOTE — Telephone Encounter (Signed)
See results note. Reviewed results with patient.

## 2016-10-01 NOTE — Telephone Encounter (Signed)
Pt returning our call to receive her echo results   Please call back

## 2016-10-20 DIAGNOSIS — L910 Hypertrophic scar: Secondary | ICD-10-CM | POA: Diagnosis not present

## 2016-11-10 ENCOUNTER — Other Ambulatory Visit: Payer: Self-pay | Admitting: Cardiovascular Disease

## 2016-11-10 ENCOUNTER — Telehealth: Payer: Self-pay | Admitting: Cardiovascular Disease

## 2016-11-10 NOTE — Telephone Encounter (Signed)
[place] patient. Routed to [facility]

## 2016-11-10 NOTE — Telephone Encounter (Signed)
Patient states that she is not able to afford her Entresto 97-103 mg and that each time it keeps going up. It was $111 and then $165. She would like to know if there is a cheaper option available. Will route to Dr. Rockey Situ for review.

## 2016-11-10 NOTE — Telephone Encounter (Signed)
Spoke with patient and made her aware we did not carry samples of Entresto 97-103 and asked if she would like me to send in a prescription. She stated she has a paper one she just never got it filled because it is to expensive because she was in the doughnut hole.

## 2016-11-10 NOTE — Telephone Encounter (Signed)
Pt would like Entresto samples.  

## 2016-11-12 NOTE — Telephone Encounter (Signed)
Can we look into patient assistance/her company assistance  would also document what dose she was taking before if any so we do not over dose her with a high dose

## 2016-11-15 NOTE — Telephone Encounter (Signed)
Forms faxed to number requested on application and placed in "Faxed" Bin on [employer].

## 2016-11-15 NOTE — Telephone Encounter (Signed)
Spoke with patient this morning and let her know that I filled out patient assistance form for her to possibly receive some free medication. Reviewed information needed for forms and requested that she stop by here when possible to sign. She was very appreciative for call and had no further questions at this time.

## 2016-11-19 ENCOUNTER — Encounter: Payer: Self-pay | Admitting: Internal Medicine

## 2016-11-19 ENCOUNTER — Ambulatory Visit (INDEPENDENT_AMBULATORY_CARE_PROVIDER_SITE_OTHER): Payer: Medicare HMO | Admitting: Internal Medicine

## 2016-11-19 VITALS — BP 130/78 | HR 72 | Temp 98.6°F | Resp 16 | Ht 64.0 in | Wt 126.2 lb

## 2016-11-19 DIAGNOSIS — I5022 Chronic systolic (congestive) heart failure: Secondary | ICD-10-CM

## 2016-11-19 DIAGNOSIS — E7800 Pure hypercholesterolemia, unspecified: Secondary | ICD-10-CM

## 2016-11-19 DIAGNOSIS — M25561 Pain in right knee: Secondary | ICD-10-CM | POA: Diagnosis not present

## 2016-11-19 DIAGNOSIS — I10 Essential (primary) hypertension: Secondary | ICD-10-CM | POA: Diagnosis not present

## 2016-11-19 DIAGNOSIS — C189 Malignant neoplasm of colon, unspecified: Secondary | ICD-10-CM | POA: Diagnosis not present

## 2016-11-19 DIAGNOSIS — Z23 Encounter for immunization: Secondary | ICD-10-CM | POA: Diagnosis not present

## 2016-11-19 DIAGNOSIS — J4520 Mild intermittent asthma, uncomplicated: Secondary | ICD-10-CM

## 2016-11-19 DIAGNOSIS — I110 Hypertensive heart disease with heart failure: Secondary | ICD-10-CM | POA: Diagnosis not present

## 2016-11-19 LAB — BASIC METABOLIC PANEL
BUN: 19 mg/dL (ref 6–23)
CALCIUM: 9.6 mg/dL (ref 8.4–10.5)
CHLORIDE: 100 meq/L (ref 96–112)
CO2: 35 mEq/L — ABNORMAL HIGH (ref 19–32)
CREATININE: 0.8 mg/dL (ref 0.40–1.20)
GFR: 87.32 mL/min (ref >60.00)
Glucose, Bld: 90 mg/dL (ref 70–99)
Potassium: 4.5 mEq/L (ref 3.5–5.1)
Sodium: 139 mEq/L (ref 135–145)

## 2016-11-19 NOTE — Progress Notes (Signed)
[name], female   DOB: 12-Apr-1928, 83 y.o.   MRN: [id number]   Subjective:    [name], female    DOB: April 05, 1928, 83 y.o.   MRN: [id number]  HPI  Patient here for a scheduled follow up.  She reports she is doing well.  Has been feeling good.  Handling stress.  Not keeping her grandchildren as much.  No chest pain.  Breathing stable. No sob.  No acid reflux.  No abdominal pain.  Bowels moving.  Persistent right knee pain.  Has seen ortho previously.  Injection helped.  This has been a few years ago.  With increased pain now, request referral to ortho for evaluation for injection.     Past Medical History:  Diagnosis Date  . Asthma   . Chronic systolic CHF (congestive heart failure) ([place])    a. 01/2015 Echo: EF 25-30%, sev diff HK, mild to mod MR, mildly dil LA, nl RV fxn, PASP 61 mmHg.  . Colon cancer ([place]) 2005  . Hypercholesterolemia   . Hypertension   . Hypertensive heart disease with CHF (congestive heart failure) ([place])   . NICM (nonischemic cardiomyopathy) ([place])    a. 01/2015 Echo: EF 25-30% sev diff HK;  b. 01/2015 Lexi MV: EF 19%, small defect of mild severity in apex - likely breast attenuation, no ischemia.  Marland Kitchen NSVT (nonsustained ventricular tachycardia) ([place])   . Osteoporosis    Past Surgical History:  Procedure Laterality Date  . ABDOMINAL HYSTERECTOMY    . BREAST BIOPSY Left 4/15`   BENIGN BREAST EPITHELIUM WITH NODULAR FIBROSIS.  Marland Kitchen INGUINAL HERNIA REPAIR Left 05/04/2016   Procedure: HERNIA REPAIR INGUINAL ADULT;  Surgeon: Robert Bellow, MD;  Location: [facility] ORS;  Service: General;  Laterality: Left;  . THYROIDECTOMY, PARTIAL  1956  . TUBAL LIGATION     Family History  Problem Relation Age of Onset  . Stroke Mother   . Colon cancer Father   . Breast cancer Daughter 7  . Breast cancer Cousin    Social History   Social History  . Marital status: Widowed    Spouse name: N/A  . Number of children: N/A  . Years of education: N/A     Social History Main Topics  . Smoking status: Never Smoker  . Smokeless tobacco: Never Used  . Alcohol use No  . Drug use: No  . Sexual activity: Not Asked   Other Topics Concern  . None   Social History Narrative  . None    Outpatient Encounter Prescriptions as of 11/19/2016  Medication Sig  . acetaminophen (TYLENOL) 325 MG tablet Take 650 mg by mouth every 6 (six) hours as needed.  Marland Kitchen aspirin (ASPIRIN EC) 81 MG EC tablet Take 81 mg by mouth daily. Swallow whole.  . Cholecalciferol (VITAMIN D-3) 1000 UNITS CAPS Take 2,000 Units by mouth daily.  . furosemide (LASIX) 40 MG tablet Take 0.5 tablets (20 mg total) by mouth daily.  . isosorbide mononitrate (IMDUR) 30 MG 24 hr tablet Take 1 tablet (30 mg total) by mouth daily.  . metoprolol succinate (TOPROL XL) 25 MG 24 hr tablet Take 1 tablet (25 mg total) by mouth daily.  . sacubitril-valsartan (ENTRESTO) 97-103 MG Take 1 tablet by mouth 2 (two) times daily.  Marland Kitchen spironolactone (ALDACTONE) 25 MG tablet Take 0.5 tablets (12.5 mg total) by mouth daily.  Marland Kitchen albuterol (PROVENTIL HFA;VENTOLIN HFA) 108 (90 Base) MCG/ACT inhaler Inhale 2 puffs into the lungs every 6 (six) hours  as needed for wheezing or shortness of breath. (Patient not taking: Reported on 11/19/2016)  . [DISCONTINUED] furosemide (LASIX) 40 MG tablet Take 1 tablet (40 mg total) by mouth daily.   No facility-administered encounter medications on file as of 11/19/2016.     Review of Systems  Constitutional: Negative for appetite change and unexpected weight change.  HENT: Negative for congestion and sinus pressure.   Respiratory: Negative for cough, chest tightness and shortness of breath.   Cardiovascular: Negative for chest pain, palpitations and leg swelling.  Gastrointestinal: Negative for abdominal pain, diarrhea, nausea and vomiting.  Genitourinary: Negative for difficulty urinating and dysuria.  Musculoskeletal: Negative for myalgias.       Right knee pain as  outlined.    Skin: Negative for color change and rash.  Neurological: Negative for dizziness, light-headedness and headaches.  Psychiatric/Behavioral: Negative for agitation and dysphoric mood.       Objective:    Physical Exam  Constitutional: She appears well-developed and well-nourished. No distress.  HENT:  Nose: Nose normal.  Mouth/Throat: Oropharynx is clear and moist.  Neck: Neck supple. No thyromegaly present.  Cardiovascular: Normal rate and regular rhythm.   Pulmonary/Chest: Breath sounds normal. No respiratory distress. She has no wheezes.  Abdominal: Soft. Bowel sounds are normal. There is no tenderness.  Musculoskeletal: She exhibits no edema or tenderness.  Some increased pain with palpation over anterior knee.  No increased erythema or warmth.   Lymphadenopathy:    She has no cervical adenopathy.  Skin: No rash noted. No erythema.  Psychiatric: She has a normal mood and affect. Her behavior is normal.    BP 130/78 (BP Location: Left Arm, Patient Position: Sitting, Cuff Size: Normal)   Pulse 72   Temp 98.6 F (37 C) (Oral)   Resp 16   Ht 5\' 4"  (1.626 m)   Wt 126 lb 3.2 oz (57.2 kg)   LMP 02/21/1966   SpO2 95%   BMI 21.66 kg/m  Wt Readings from Last 3 Encounters:  11/19/16 126 lb 3.2 oz (57.2 kg)  06/15/16 127 lb 9.6 oz (57.9 kg)  06/14/16 128 lb 8 oz (58.3 kg)     Lab Results  Component Value Date   WBC 4.6 05/02/2016   HGB 13.1 05/02/2016   HCT 39.7 05/02/2016   PLT 191 05/02/2016   GLUCOSE 90 11/19/2016   CHOL 198 09/15/2016   TRIG 48.0 09/15/2016   HDL 68.70 09/15/2016   LDLDIRECT 128.3 02/08/2013   LDLCALC 120 (H) 09/15/2016   ALT 12 09/15/2016   AST 18 09/15/2016   NA 139 11/19/2016   K 4.5 11/19/2016   CL 100 11/19/2016   CREATININE 0.80 11/19/2016   BUN 19 11/19/2016   CO2 35 (H) 11/19/2016   TSH 0.588 01/29/2016   INR 1.0 12/09/2015   HGBA1C 6.3 09/15/2016    Dg Abd 2 Views  Result Date: 05/03/2016 CLINICAL DATA:  Acute onset  of generalized abdominal pain. Initial encounter. EXAM: ABDOMEN - 2 VIEW COMPARISON:  CT of the abdomen and pelvis performed today at 12:54 a.m. FINDINGS: The visualized bowel gas pattern is unremarkable. Scattered air and stool filled loops of colon are seen; no abnormal dilatation of small bowel loops is seen to suggest small bowel obstruction. No free intra-abdominal air is identified, though evaluation for free air is limited on a single supine view. Postoperative change is noted about the upper pelvis. Contrast is noted at the renal collecting systems and bladder. The visualized osseous structures are within  normal limits; the sacroiliac joints are unremarkable in appearance. The visualized lung bases are essentially clear. IMPRESSION: Unremarkable bowel gas pattern; no free intra-abdominal air seen. Small to moderate amount of stool noted in the colon. Electronically Signed   By: Garald Balding M.D.   On: 05/03/2016 03:30       Assessment & Plan:   Problem List Items Addressed This Visit    Asthma    Followed by Dr Raul Del.  Stable.        Chronic systolic heart failure (HCC) - Primary (Chronic)    Currently doing well.  Continue same medication regimen.  Continue f/u with Dr Rockey Situ.        Relevant Orders   Basic metabolic panel (Completed)   Colon adenocarcinoma ([place])    Colonoscopy 05/2012.  CEA 12/01/15 - 1.5.  Follow.        Relevant Orders   CBC with Differential/Platelet   CEA   Essential hypertension    Blood pressure under good control.  Continue same medication regimen.  Follow pressures.  Follow metabolic panel.        Hypercholesterolemia    Low cholesterol diet and exercise.  Follow lipid panel.        Relevant Orders   Hepatic function panel   Lipid panel   Hypertensive heart disease with CHF (congestive heart failure) (HCC)    Stable on current regimen.  Followed by cardiology.        Relevant Orders   TSH   Basic metabolic panel   Right knee pain     Previous injection helped.  Now with increased pain. Request referral to ortho for evaluation for possible injection.        Relevant Orders   Ambulatory referral to Orthopedic Surgery       Einar Pheasant, MD

## 2016-11-21 ENCOUNTER — Encounter: Payer: Self-pay | Admitting: Internal Medicine

## 2016-11-21 NOTE — Assessment & Plan Note (Signed)
Low cholesterol diet and exercise.  Follow lipid panel.   

## 2016-11-21 NOTE — Assessment & Plan Note (Signed)
Colonoscopy 05/2012.  CEA 12/01/15 - 1.5.  Follow.

## 2016-11-21 NOTE — Assessment & Plan Note (Signed)
Currently doing well.  Continue same medication regimen.  Continue f/u with Dr Rockey Situ.

## 2016-11-21 NOTE — Assessment & Plan Note (Signed)
Blood pressure under good control.  Continue same medication regimen.  Follow pressures.  Follow metabolic panel.   

## 2016-11-21 NOTE — Assessment & Plan Note (Signed)
Previous injection helped.  Now with increased pain. Request referral to ortho for evaluation for possible injection.

## 2016-11-21 NOTE — Assessment & Plan Note (Signed)
Followed by Dr Raul Del.  Stable.

## 2016-11-21 NOTE — Assessment & Plan Note (Signed)
Stable on current regimen.  Followed by cardiology.   

## 2016-11-23 DIAGNOSIS — E7800 Pure hypercholesterolemia, unspecified: Secondary | ICD-10-CM | POA: Diagnosis not present

## 2016-11-23 DIAGNOSIS — J4520 Mild intermittent asthma, uncomplicated: Secondary | ICD-10-CM | POA: Diagnosis not present

## 2016-11-23 DIAGNOSIS — Z23 Encounter for immunization: Secondary | ICD-10-CM

## 2016-11-23 DIAGNOSIS — C189 Malignant neoplasm of colon, unspecified: Secondary | ICD-10-CM | POA: Diagnosis not present

## 2016-11-23 DIAGNOSIS — I5022 Chronic systolic (congestive) heart failure: Secondary | ICD-10-CM | POA: Diagnosis not present

## 2016-11-23 DIAGNOSIS — M25561 Pain in right knee: Secondary | ICD-10-CM | POA: Diagnosis not present

## 2016-11-23 DIAGNOSIS — I110 Hypertensive heart disease with heart failure: Secondary | ICD-10-CM | POA: Diagnosis not present

## 2016-11-23 DIAGNOSIS — I10 Essential (primary) hypertension: Secondary | ICD-10-CM | POA: Diagnosis not present

## 2016-11-29 DIAGNOSIS — M1711 Unilateral primary osteoarthritis, right knee: Secondary | ICD-10-CM | POA: Diagnosis not present

## 2016-12-03 ENCOUNTER — Telehealth: Payer: Self-pay | Admitting: Cardiovascular Disease

## 2016-12-03 NOTE — Telephone Encounter (Signed)
Pt calling stating she is in donut hole   Her friend who was also taking entreso but was recently taken off that  She wanted to know if she could take his She states the dose is a bit different but wanted to know even if she could take it Please advise.

## 2016-12-03 NOTE — Telephone Encounter (Signed)
Spoke with patient and she is out of medication and her friend gave her his medication because he was no longer on it. Advised that she should not take any medications that are not hers due to safety reasons. She verbalized understanding with no further questions at this time.

## 2016-12-11 ENCOUNTER — Other Ambulatory Visit: Payer: Self-pay | Admitting: Cardiovascular Disease

## 2017-02-10 ENCOUNTER — Other Ambulatory Visit: Payer: Self-pay | Admitting: Cardiovascular Disease

## 2017-03-08 NOTE — Progress Notes (Signed)
Cardiology Office Note  Date:  03/09/2017   ID:  Sole Lengacher, DOB 1928/03/20, MRN [id number]  PCP:  Einar Pheasant, MD   Chief Complaint  Patient presents with  . OTHER    OD 85month f/u discuss Entresto cost . Meds reviewed verbally with pt.    HPI:  84 y/o ? with a h/o  HTN,   [facility] 01/28/2016 for shortness of breath,  diagnosed with acute systolic CHF  ejection fraction 25-35%, nonischemic 01/2016 myoview showing no ischemic,  Repeat echo EF 50 to 55% 09/2016 who presents for follow-up  of her chronic systolic CHF   In follow-up today she reports that she feels well with no complaints Denies any abdominal bloating, leg swelling, weight gain Compliant with her medications entresto expensive Obtain some pills through a friend who did not need their Selinda [place]aels into the donut hole last year had difficulty affording $160 per month pills  Active, helps to take care of her great-grandchildren  Denies chest pain, palpitations, dyspnea, pnd, orthopnea, n, v, dizziness, syncope, edema, weight gain, or early satiety.  Lab work reviewed Stable renal function October 2018 Scheduled to see primary care later this months with lab work  EKG personally reviewed by myself on todays visit Shows normal sinus rhythm rate 61 bpm nonspecific T wave abnormality anterolateral leads V5 and V6 left axis deviation  PMH:   has a past medical history of Asthma, Chronic systolic CHF (congestive heart failure) ([place]), Colon cancer ([place]) (2005), Hypercholesterolemia, Hypertension, Hypertensive heart disease with CHF (congestive heart failure) ([place]), NICM (nonischemic cardiomyopathy) ([place]), NSVT (nonsustained ventricular tachycardia) ([place]), and Osteoporosis.  PSH:    Past Surgical History:  Procedure Laterality Date  . ABDOMINAL HYSTERECTOMY    . BREAST BIOPSY Left 4/15`   BENIGN BREAST EPITHELIUM WITH NODULAR FIBROSIS.  Marland Kitchen INGUINAL HERNIA REPAIR Left 05/04/2016   Procedure: HERNIA REPAIR INGUINAL  ADULT;  Surgeon: Robert Bellow, MD;  Location: [facility] ORS;  Service: General;  Laterality: Left;  . THYROIDECTOMY, PARTIAL  1956  . TUBAL LIGATION      Current Outpatient Medications  Medication Sig Dispense Refill  . acetaminophen (TYLENOL) 325 MG tablet Take 650 mg by mouth every 6 (six) hours as needed.    Marland Kitchen aspirin (ASPIRIN EC) 81 MG EC tablet Take 81 mg by mouth daily. Swallow whole.    . Cholecalciferol (VITAMIN D-3) 1000 UNITS CAPS Take 2,000 Units by mouth daily.    . furosemide (LASIX) 40 MG tablet Take 1 tablet (40 mg total) by mouth daily. 30 tablet 0  . isosorbide mononitrate (IMDUR) 30 MG 24 hr tablet Take 1 tablet (30 mg total) by mouth daily. 30 tablet 0  . metoprolol succinate (TOPROL XL) 25 MG 24 hr tablet Take 1 tablet (25 mg total) by mouth daily. 90 tablet 3  . sacubitril-valsartan (ENTRESTO) 97-103 MG Take 1 tablet by mouth 2 (two) times daily. 60 tablet 6  . spironolactone (ALDACTONE) 25 MG tablet Take 0.5 tablets (12.5 mg total) by mouth daily. 30 tablet 5   No current facility-administered medications for this visit.      Allergies:   Patient has no known allergies.   Social History:  The patient  reports that  has never smoked. she has never used smokeless tobacco. She reports that she does not drink alcohol or use drugs.   Family History:   family history includes Breast cancer in her cousin; Breast cancer (age of onset: 56) in her daughter; Colon cancer in her father;  Stroke in her mother.    Review of Systems: Review of Systems  Constitutional: Negative.   Respiratory: Negative.   Cardiovascular: Negative.   Gastrointestinal: Negative.   Musculoskeletal: Negative.   Neurological: Negative.   Psychiatric/Behavioral: Negative.   All other systems reviewed and are negative.    PHYSICAL EXAM: VS:  BP 120/84 (BP Location: Left Arm, Patient Position: Sitting, Cuff Size: Normal)   Pulse 61   Ht 5\' 2"  (1.575 m)   Wt 128 lb 4 oz (58.2 kg)   LMP  02/21/1966   BMI 23.46 kg/m  , BMI Body mass index is 23.46 kg/m. Constitutional:  oriented to person, place, and time. No distress.  HENT:  Head: Normocephalic and atraumatic.  Eyes:  no discharge. No scleral icterus.  Neck: Normal range of motion. Neck supple. No JVD present.  Cardiovascular: Normal rate, regular rhythm, normal heart sounds and intact distal pulses. Exam reveals no gallop and no friction rub. No edema No murmur heard. Pulmonary/Chest: Effort normal and breath sounds normal. No stridor. No respiratory distress.  no wheezes.  no rales.  no tenderness.  Abdominal: Soft.  no distension.  no tenderness.  Musculoskeletal: Normal range of motion.  no  tenderness or deformity.  Neurological:  normal muscle tone. Coordination normal. No atrophy Skin: Skin is warm and dry. No rash noted. not diaphoretic.  Psychiatric:  normal mood and affect. behavior is normal. Thought content normal.       Recent Labs: 05/02/2016: Hemoglobin 13.1; Platelets 191 09/15/2016: ALT 12 11/19/2016: BUN 19; Creatinine, Ser 0.80; Potassium 4.5; Sodium 139    Lipid Panel Lab Results  Component Value Date   CHOL 198 09/15/2016   HDL 68.70 09/15/2016   LDLCALC 120 (H) 09/15/2016   TRIG 48.0 09/15/2016      Wt Readings from Last 3 Encounters:  03/09/17 128 lb 4 oz (58.2 kg)  11/19/16 126 lb 3.2 oz (57.2 kg)  06/15/16 127 lb 9.6 oz (57.9 kg)       ASSESSMENT AND PLAN:  Chronic systolic heart failure (HCC) Weight is stable Stable renal function She has changed her diet, now following low-sodium diet Encouraged her to stay on her current medications No medication changes made Improvement in ejection fraction now low normal on last echocardiogram  Essential hypertension Blood pressure stable, no medication changes made Continue current medication doses  Hypercholesterolemia Currently not on a cholesterol medication Acceptable level, no known disease   Total encounter time more  than 25 minutes  Greater than 50% was spent in counseling and coordination of care with the patient   Disposition:   F/U 6 months    Orders Placed This Encounter  Procedures  . EKG 12-Lead     Signed, Esmond Plants, M.D., Ph.D. 03/09/2017  [place], [employer]

## 2017-03-09 ENCOUNTER — Encounter: Payer: Self-pay | Admitting: Cardiovascular Disease

## 2017-03-09 ENCOUNTER — Ambulatory Visit: Payer: Medicare HMO | Admitting: Cardiovascular Disease

## 2017-03-09 VITALS — BP 120/84 | HR 61 | Ht 62.0 in | Wt 128.2 lb

## 2017-03-09 DIAGNOSIS — I5022 Chronic systolic (congestive) heart failure: Secondary | ICD-10-CM

## 2017-03-09 DIAGNOSIS — E7800 Pure hypercholesterolemia, unspecified: Secondary | ICD-10-CM | POA: Diagnosis not present

## 2017-03-09 DIAGNOSIS — I10 Essential (primary) hypertension: Secondary | ICD-10-CM

## 2017-03-09 DIAGNOSIS — I428 Other cardiomyopathies: Secondary | ICD-10-CM | POA: Diagnosis not present

## 2017-03-09 NOTE — Patient Instructions (Signed)

## 2017-03-10 ENCOUNTER — Other Ambulatory Visit: Payer: Self-pay | Admitting: Cardiovascular Disease

## 2017-03-21 ENCOUNTER — Other Ambulatory Visit (INDEPENDENT_AMBULATORY_CARE_PROVIDER_SITE_OTHER): Payer: Medicare HMO

## 2017-03-21 DIAGNOSIS — C189 Malignant neoplasm of colon, unspecified: Secondary | ICD-10-CM

## 2017-03-21 DIAGNOSIS — E7800 Pure hypercholesterolemia, unspecified: Secondary | ICD-10-CM

## 2017-03-21 DIAGNOSIS — I110 Hypertensive heart disease with heart failure: Secondary | ICD-10-CM

## 2017-03-21 LAB — BASIC METABOLIC PANEL
BUN: 17 mg/dL (ref 6–23)
CALCIUM: 10 mg/dL (ref 8.4–10.5)
CO2: 33 mEq/L — ABNORMAL HIGH (ref 19–32)
Chloride: 102 mEq/L (ref 96–112)
Creatinine, Ser: 0.8 mg/dL (ref 0.40–1.20)
GFR: 87.25 mL/min (ref >60.00)
Glucose, Bld: 98 mg/dL (ref 70–99)
Potassium: 4.2 mEq/L (ref 3.5–5.1)
Sodium: 142 mEq/L (ref 135–145)

## 2017-03-21 LAB — HEPATIC FUNCTION PANEL
ALT: 10 U/L (ref 0–35)
AST: 17 U/L (ref 0–37)
Albumin: 4.1 g/dL (ref 3.5–5.2)
Alkaline Phosphatase: 65 U/L (ref 39–117)
BILIRUBIN DIRECT: 0.1 mg/dL (ref 0.0–0.3)
BILIRUBIN TOTAL: 0.4 mg/dL (ref 0.2–1.2)
Total Protein: 7.1 g/dL (ref 6.0–8.3)

## 2017-03-21 LAB — CBC WITH DIFFERENTIAL/PLATELET
BASOS PCT: 0.4 % (ref 0.0–3.0)
Basophils Absolute: 0 10*3/uL (ref 0.0–0.1)
EOS PCT: 0.2 % (ref 0.0–5.0)
Eosinophils Absolute: 0 10*3/uL (ref 0.0–0.7)
HEMATOCRIT: 40.2 % (ref 36.0–46.0)
HEMOGLOBIN: 13.6 g/dL (ref 12.0–15.0)
LYMPHS PCT: 40.8 % (ref 12.0–46.0)
Lymphs Abs: 1.4 10*3/uL (ref 0.7–4.0)
MCHC: 33.7 g/dL (ref 30.0–36.0)
MCV: 90.7 fl (ref 78.0–100.0)
MONO ABS: 0.4 10*3/uL (ref 0.1–1.0)
Monocytes Relative: 11.1 % (ref 3.0–12.0)
NEUTROS ABS: 1.6 10*3/uL (ref 1.4–7.7)
Neutrophils Relative %: 47.5 % (ref 43.0–77.0)
PLATELETS: 196 10*3/uL (ref 150.0–400.0)
RBC: 4.43 Mil/uL (ref 3.87–5.11)
RDW: 13 % (ref 11.5–15.5)
WBC: 3.4 10*3/uL — ABNORMAL LOW (ref 4.0–10.5)

## 2017-03-21 LAB — LIPID PANEL
CHOLESTEROL: 209 mg/dL — AB (ref 0–200)
HDL: 79.3 mg/dL (ref >39.00)
LDL Cholesterol: 121 mg/dL — ABNORMAL HIGH (ref 0–99)
NONHDL: 129.75
Total CHOL/HDL Ratio: 3
Triglycerides: 43 mg/dL (ref 0.0–149.0)
VLDL: 8.6 mg/dL (ref 0.0–40.0)

## 2017-03-21 LAB — TSH: TSH: 0.63 u[IU]/mL (ref 0.35–4.50)

## 2017-03-22 LAB — CEA: CEA: 2.6 ng/mL — ABNORMAL HIGH

## 2017-03-23 ENCOUNTER — Other Ambulatory Visit: Payer: Self-pay | Admitting: Internal Medicine

## 2017-03-23 ENCOUNTER — Encounter: Payer: Self-pay | Admitting: Internal Medicine

## 2017-03-23 ENCOUNTER — Ambulatory Visit (INDEPENDENT_AMBULATORY_CARE_PROVIDER_SITE_OTHER): Payer: Medicare HMO | Admitting: Internal Medicine

## 2017-03-23 VITALS — BP 138/78 | HR 84 | Temp 98.4°F | Resp 16 | Ht 62.0 in | Wt 130.0 lb

## 2017-03-23 DIAGNOSIS — J4520 Mild intermittent asthma, uncomplicated: Secondary | ICD-10-CM

## 2017-03-23 DIAGNOSIS — R208 Other disturbances of skin sensation: Secondary | ICD-10-CM

## 2017-03-23 DIAGNOSIS — R7989 Other specified abnormal findings of blood chemistry: Secondary | ICD-10-CM

## 2017-03-23 DIAGNOSIS — E7800 Pure hypercholesterolemia, unspecified: Secondary | ICD-10-CM

## 2017-03-23 DIAGNOSIS — I5022 Chronic systolic (congestive) heart failure: Secondary | ICD-10-CM | POA: Diagnosis not present

## 2017-03-23 DIAGNOSIS — Z85038 Personal history of other malignant neoplasm of large intestine: Secondary | ICD-10-CM

## 2017-03-23 DIAGNOSIS — R945 Abnormal results of liver function studies: Secondary | ICD-10-CM

## 2017-03-23 DIAGNOSIS — Z1239 Encounter for other screening for malignant neoplasm of breast: Secondary | ICD-10-CM

## 2017-03-23 DIAGNOSIS — Z0000 Encounter for general adult medical examination without abnormal findings: Secondary | ICD-10-CM

## 2017-03-23 DIAGNOSIS — Z1231 Encounter for screening mammogram for malignant neoplasm of breast: Secondary | ICD-10-CM

## 2017-03-23 DIAGNOSIS — D72819 Decreased white blood cell count, unspecified: Secondary | ICD-10-CM

## 2017-03-23 NOTE — Progress Notes (Signed)
[name], female   DOB: 1928/04/23, 84 y.o.   MRN: [id number]   Subjective:    [name], female    DOB: 05/18/1928, 84 y.o.   MRN: [id number]  HPI  Patient here for her physical exam.  She just saw Dr Rockey Situ 03/09/17 for f/u CHF.  Stable renal function.  Overall felt to be stable.  Blood pressure stable.  No changes made.  She reports she is doing relatively well.  Handling stress.  Stress is better.  No chest pain.  Breathing stable.  No acid reflux.  No abdominal pain.  Bowels moving.  No urine change.  Some burning on the bottom of her feet - intermittently.  States will just feel hot at times. Not a persistent issue for her.  Desires no further intervention.  Runny nose at times.  No chest congestion.     Past Medical History:  Diagnosis Date  . Asthma   . Chronic systolic CHF (congestive heart failure) ([place])    a. 01/2015 Echo: EF 25-30%, sev diff HK, mild to mod MR, mildly dil LA, nl RV fxn, PASP 61 mmHg.  . Colon cancer ([place]) 2005  . Hypercholesterolemia   . Hypertension   . Hypertensive heart disease with CHF (congestive heart failure) ([place])   . NICM (nonischemic cardiomyopathy) ([place])    a. 01/2015 Echo: EF 25-30% sev diff HK;  b. 01/2015 Lexi MV: EF 19%, small defect of mild severity in apex - likely breast attenuation, no ischemia.  Marland Kitchen NSVT (nonsustained ventricular tachycardia) ([place])   . Osteoporosis    Past Surgical History:  Procedure Laterality Date  . ABDOMINAL HYSTERECTOMY    . BREAST BIOPSY Left 4/15`   BENIGN BREAST EPITHELIUM WITH NODULAR FIBROSIS.  Marland Kitchen INGUINAL HERNIA REPAIR Left 05/04/2016   Procedure: HERNIA REPAIR INGUINAL ADULT;  Surgeon: Robert Bellow, MD;  Location: [facility] ORS;  Service: General;  Laterality: Left;  . THYROIDECTOMY, PARTIAL  1956  . TUBAL LIGATION     Family History  Problem Relation Age of Onset  . Stroke Mother   . Colon cancer Father   . Breast cancer Daughter 70  . Breast cancer Cousin    Social  History   Socioeconomic History  . Marital status: Widowed    Spouse name: None  . Number of children: None  . Years of education: None  . Highest education level: None  Social Needs  . Financial resource strain: None  . Food insecurity - worry: None  . Food insecurity - inability: None  . Transportation needs - medical: None  . Transportation needs - non-medical: None  Occupational History  . None  Tobacco Use  . Smoking status: Never Smoker  . Smokeless tobacco: Never Used  Substance and Sexual Activity  . Alcohol use: No    Alcohol/week: 0.0 oz  . Drug use: No  . Sexual activity: None  Other Topics Concern  . None  Social History Narrative  . None    Outpatient Encounter Medications as of 03/23/2017  Medication Sig  . acetaminophen (TYLENOL) 325 MG tablet Take 650 mg by mouth every 6 (six) hours as needed.  Marland Kitchen aspirin (ASPIRIN EC) 81 MG EC tablet Take 81 mg by mouth daily. Swallow whole.  . Cholecalciferol (VITAMIN D-3) 1000 UNITS CAPS Take 2,000 Units by mouth daily.  . furosemide (LASIX) 40 MG tablet Take 1 tablet (40 mg total) by mouth daily.  . isosorbide mononitrate (IMDUR) 30 MG 24 hr tablet Take  1 tablet (30 mg total) by mouth daily.  . metoprolol succinate (TOPROL XL) 25 MG 24 hr tablet Take 1 tablet (25 mg total) by mouth daily.  . sacubitril-valsartan (ENTRESTO) 97-103 MG Take 1 tablet by mouth 2 (two) times daily.  . [DISCONTINUED] spironolactone (ALDACTONE) 25 MG tablet Take 0.5 tablets (12.5 mg total) by mouth daily.   No facility-administered encounter medications on file as of 03/23/2017.     Review of Systems  Constitutional: Negative for appetite change and unexpected weight change.  HENT: Negative for congestion and sinus pressure.        Runny nose.   Eyes: Negative for pain and visual disturbance.  Respiratory: Negative for cough and chest tightness.        Breathing stable.    Cardiovascular: Negative for chest pain, palpitations and leg  swelling.  Gastrointestinal: Negative for abdominal pain, diarrhea, nausea and vomiting.  Genitourinary: Negative for difficulty urinating and dysuria.  Musculoskeletal: Negative for back pain and joint swelling.  Skin: Negative for color change and rash.  Neurological: Negative for dizziness, light-headedness and headaches.  Hematological: Negative for adenopathy. Does not bruise/bleed easily.  Psychiatric/Behavioral: Negative for agitation and dysphoric mood.       Objective:     Blood pressure rechecked by me:  138/78  Physical Exam  Constitutional: She is oriented to person, place, and time. She appears well-developed and well-nourished. No distress.  HENT:  Nose: Nose normal.  Mouth/Throat: Oropharynx is clear and moist.  Eyes: Right eye exhibits no discharge. Left eye exhibits no discharge. No scleral icterus.  Neck: Neck supple. No thyromegaly present.  Cardiovascular: Normal rate and regular rhythm.  Pulmonary/Chest: Breath sounds normal. No accessory muscle usage. No tachypnea. No respiratory distress. She has no decreased breath sounds. She has no wheezes. She has no rhonchi. Right breast exhibits no inverted nipple, no mass, no nipple discharge and no tenderness (no axillary adenopathy). Left breast exhibits no inverted nipple, no mass, no nipple discharge and no tenderness (no axilarry adenopathy).  Abdominal: Soft. Bowel sounds are normal. There is no tenderness.  Musculoskeletal: She exhibits no edema or tenderness.  Lymphadenopathy:    She has no cervical adenopathy.  Neurological: She is alert and oriented to person, place, and time.  Skin: Skin is warm. No rash noted. No erythema.  Psychiatric: She has a normal mood and affect. Her behavior is normal.    BP 138/78   Pulse 84   Temp 98.4 F (36.9 C) (Oral)   Resp 16   Ht 5\' 2"  (1.575 m)   Wt 130 lb (59 kg)   LMP 02/21/1966   SpO2 93%   BMI 23.78 kg/m  Wt Readings from Last 3 Encounters:  03/23/17 130 lb  (59 kg)  03/09/17 128 lb 4 oz (58.2 kg)  11/19/16 126 lb 3.2 oz (57.2 kg)     Lab Results  Component Value Date   WBC 3.4 (L) 03/21/2017   HGB 13.6 03/21/2017   HCT 40.2 03/21/2017   PLT 196.0 03/21/2017   GLUCOSE 98 03/21/2017   CHOL 209 (H) 03/21/2017   TRIG 43.0 03/21/2017   HDL 79.30 03/21/2017   LDLDIRECT 128.3 02/08/2013   LDLCALC 121 (H) 03/21/2017   ALT 10 03/21/2017   AST 17 03/21/2017   NA 142 03/21/2017   K 4.2 03/21/2017   CL 102 03/21/2017   CREATININE 0.80 03/21/2017   BUN 17 03/21/2017   CO2 33 (H) 03/21/2017   TSH 0.63 03/21/2017   INR  1.0 12/09/2015   HGBA1C 6.3 09/15/2016    Ct Abdomen Pelvis W Contrast  Result Date: 05/03/2016 CLINICAL DATA:  84 y/o F; left lower quadrant and mid abdominal pain. EXAM: CT ABDOMEN AND PELVIS WITH CONTRAST TECHNIQUE: Multidetector CT imaging of the abdomen and pelvis was performed using the standard protocol following bolus administration of intravenous contrast. CONTRAST:  19mL ISOVUE-300 IOPAMIDOL (ISOVUE-300) INJECTION 61% COMPARISON:  03/28/2016 CT of abdomen and pelvis. FINDINGS: Lower chest: Mild cardiomegaly.  Small hiatal hernia. Hepatobiliary: Scattered subcentimeter lucencies throughout the liver are well-circumscribed and likely represent cysts. Normal gallbladder. No intra or extrahepatic biliary ductal dilatation. Pancreas: Probable pancreas divisum.  No acute inflammatory changes. Spleen: Normal in size without focal abnormality. Adrenals/Urinary Tract: Normal adrenal glands. Left kidney upper pole subcentimeter cyst. No other focal kidney lesion identified. No hydronephrosis. Normal bladder. Stomach/Bowel: Dilated small bowel with transition point in the left hemipelvis (series 2, image 60). Rectosigmoid postsurgical changes are patent. Normal colon. Normal appendix. Vascular/Lymphatic: Aortic atherosclerosis. No enlarged abdominal or pelvic lymph nodes. Reproductive: Status post hysterectomy. No adnexal masses. Other:  No herniation of small bowel into the left inguinal canal. Musculoskeletal: Stable grade 1 L3-4 and L4-5 anterolisthesis. Stable prominent trabecula of the L4 vertebral body probably representing hemangioma. Moderate right and mild left hip osteoarthrosis. IMPRESSION: 1. Dilated small bowel loops with transition in the left hemipelvis probably representing obstruction. 2. Mild cardiomegaly. 3. Small hiatal hernia. 4. Probable pancreas divisum. 5. Aortic atherosclerosis. Electronically Signed   By: Kristine Garbe M.D.   On: 05/03/2016 01:27   Dg Abd 2 Views  Result Date: 05/03/2016 CLINICAL DATA:  Acute onset of generalized abdominal pain. Initial encounter. EXAM: ABDOMEN - 2 VIEW COMPARISON:  CT of the abdomen and pelvis performed today at 12:54 a.m. FINDINGS: The visualized bowel gas pattern is unremarkable. Scattered air and stool filled loops of colon are seen; no abnormal dilatation of small bowel loops is seen to suggest small bowel obstruction. No free intra-abdominal air is identified, though evaluation for free air is limited on a single supine view. Postoperative change is noted about the upper pelvis. Contrast is noted at the renal collecting systems and bladder. The visualized osseous structures are within normal limits; the sacroiliac joints are unremarkable in appearance. The visualized lung bases are essentially clear. IMPRESSION: Unremarkable bowel gas pattern; no free intra-abdominal air seen. Small to moderate amount of stool noted in the colon. Electronically Signed   By: Garald Balding M.D.   On: 05/03/2016 03:30       Assessment & Plan:   Problem List Items Addressed This Visit    Abnormal liver function tests    Follow liver function tests.        Asthma    Followed by Dr Raul Del.  Stable.       Chronic systolic heart failure (HCC) (Chronic)    Currently doing well.  No evidence of volume overload on exam.  Followed by cardiology.       Health care maintenance     Physical today 03/23/17.  S/p hysterectomy.  Desires to have mammogram scheduled.  Colonoscopy 06/08/12 - ok.        History of colon cancer    Colonoscopy 05/2012.  CEA - 2.5.  D/w oncology.        Hypercholesterolemia    Discussed recent labs and calculated cholesterol risk.  Discussed starting cholesterol medication.  She declines.  Low cholesterol diet and exercise.  Follow lipid panel and liver function tests.  Relevant Orders   Hepatic function panel   Lipid panel   Basic metabolic panel   Leukopenia    Follow cbc.       Relevant Orders   CBC with Differential/Platelet    Other Visit Diagnoses    Breast cancer screening    -  Primary   Relevant Orders   MM DIGITAL SCREENING BILATERAL   Burning sensation of feet       Notices intermittently.  check B12.  desires no further intervention or evaluation.     Relevant Orders   Vitamin B12       Einar Pheasant, MD

## 2017-03-23 NOTE — Assessment & Plan Note (Signed)
Physical today 03/23/17.  S/p hysterectomy.  Desires to have mammogram scheduled.  Colonoscopy 06/08/12 - ok.

## 2017-03-24 ENCOUNTER — Other Ambulatory Visit: Payer: Self-pay | Admitting: Cardiovascular Disease

## 2017-03-26 NOTE — Assessment & Plan Note (Signed)
Follow liver function tests.   

## 2017-03-26 NOTE — Assessment & Plan Note (Signed)
Followed by Dr Raul Del.  Stable.

## 2017-03-26 NOTE — Assessment & Plan Note (Signed)
Colonoscopy 05/2012.  CEA - 2.5.  D/w oncology.

## 2017-03-26 NOTE — Assessment & Plan Note (Signed)
Discussed recent labs and calculated cholesterol risk.  Discussed starting cholesterol medication.  She declines.  Low cholesterol diet and exercise.  Follow lipid panel and liver function tests.

## 2017-03-26 NOTE — Assessment & Plan Note (Signed)
Currently doing well.  No evidence of volume overload on exam.  Followed by cardiology.

## 2017-03-26 NOTE — Assessment & Plan Note (Signed)
Follow cbc.  

## 2017-03-28 ENCOUNTER — Other Ambulatory Visit: Payer: Self-pay

## 2017-04-11 ENCOUNTER — Other Ambulatory Visit: Payer: Self-pay | Admitting: *Deleted

## 2017-04-11 MED ORDER — ISOSORBIDE MONONITRATE ER 30 MG PO TB24: 30.0000 mg | ORAL_TABLET | Freq: Every day | ORAL | 5 refills | Status: DC

## 2017-05-04 ENCOUNTER — Ambulatory Visit
Admission: RE | Admit: 2017-05-04 | Discharge: 2017-05-04 | Disposition: A | Discharge status: Home / Self Care | Payer: Medicare HMO | Source: Ambulatory Visit | Attending: Internal Medicine | Admitting: Internal Medicine

## 2017-05-04 DIAGNOSIS — Z1231 Encounter for screening mammogram for malignant neoplasm of breast: Secondary | ICD-10-CM | POA: Diagnosis not present

## 2017-05-04 DIAGNOSIS — Z1239 Encounter for other screening for malignant neoplasm of breast: Secondary | ICD-10-CM

## 2017-05-09 DIAGNOSIS — L910 Hypertrophic scar: Secondary | ICD-10-CM | POA: Diagnosis not present

## 2017-05-09 DIAGNOSIS — L853 Xerosis cutis: Secondary | ICD-10-CM | POA: Diagnosis not present

## 2017-05-10 ENCOUNTER — Encounter: Payer: Self-pay | Admitting: General Surgery

## 2017-05-10 ENCOUNTER — Ambulatory Visit: Payer: Medicare HMO | Admitting: General Surgery

## 2017-05-10 VITALS — BP 122/66 | HR 66 | Resp 14 | Ht 62.0 in | Wt 129.0 lb

## 2017-05-10 DIAGNOSIS — N6323 Unspecified lump in the left breast, lower outer quadrant: Secondary | ICD-10-CM | POA: Diagnosis not present

## 2017-05-10 NOTE — Progress Notes (Signed)
[name], female   DOB: [birth year], 84 y.o.   MRN: [id number]  Chief Complaint  Patient presents with  . Follow-up    HPI Kathy Dominguez is a 84 y.o. female who presents for a breast evaluation. The most recent mammogram was done on 05/03/2017.  Patient does perform regular self breast checks and gets regular mammograms done.    HPI  Past Medical History:  Diagnosis Date  . Asthma   . Chronic systolic CHF (congestive heart failure) ([place])    a. 01/2015 Echo: EF 25-30%, sev diff HK, mild to mod MR, mildly dil LA, nl RV fxn, PASP 61 mmHg.  . Colon cancer ([place]) 2005  . Hypercholesterolemia   . Hypertension   . Hypertensive heart disease with CHF (congestive heart failure) ([place])   . NICM (nonischemic cardiomyopathy) ([place])    a. 01/2015 Echo: EF 25-30% sev diff HK;  b. 01/2015 Lexi MV: EF 19%, small defect of mild severity in apex - likely breast attenuation, no ischemia.  Marland Kitchen NSVT (nonsustained ventricular tachycardia) ([place])   . Osteoporosis     Past Surgical History:  Procedure Laterality Date  . ABDOMINAL HYSTERECTOMY    . BREAST BIOPSY Left 4/15`   BENIGN BREAST EPITHELIUM WITH NODULAR FIBROSIS.  Marland Kitchen INGUINAL HERNIA REPAIR Left 05/04/2016   Procedure: HERNIA REPAIR INGUINAL ADULT;  Surgeon: Robert Bellow, MD;  Location: [facility] ORS;  Service: General;  Laterality: Left;  . THYROIDECTOMY, PARTIAL  1956  . TUBAL LIGATION      Family History  Problem Relation Age of Onset  . Stroke Mother   . Colon cancer Father   . Breast cancer Daughter 71  . Breast cancer Cousin     Social History Social History   Tobacco Use  . Smoking status: Never Smoker  . Smokeless tobacco: Never Used  Substance Use Topics  . Alcohol use: No    Alcohol/week: 0.0 oz  . Drug use: No    No Known Allergies  Current Outpatient Medications  Medication Sig Dispense Refill  . acetaminophen (TYLENOL) 325 MG tablet Take 650 mg by mouth every 6 (six) hours as needed.    Marland Kitchen aspirin  (ASPIRIN EC) 81 MG EC tablet Take 81 mg by mouth daily. Swallow whole.    . Cholecalciferol (VITAMIN D-3) 1000 UNITS CAPS Take 2,000 Units by mouth daily.    . furosemide (LASIX) 40 MG tablet Take 1 tablet (40 mg total) by mouth daily. 30 tablet 5  . isosorbide mononitrate (IMDUR) 30 MG 24 hr tablet Take 1 tablet (30 mg total) by mouth daily. 30 tablet 5  . metoprolol succinate (TOPROL XL) 25 MG 24 hr tablet Take 1 tablet (25 mg total) by mouth daily. 90 tablet 3  . sacubitril-valsartan (ENTRESTO) 97-103 MG Take 1 tablet by mouth 2 (two) times daily. 60 tablet 6  . spironolactone (ALDACTONE) 25 MG tablet Take 0.5 tablets (12.5 mg total) by mouth daily. 30 tablet 0   No current facility-administered medications for this visit.     Review of Systems Review of Systems  Constitutional: Negative.   Respiratory: Negative.   Cardiovascular: Negative.     Blood pressure 122/66, pulse 66, resp. rate 14, height 5\' 2"  (1.575 m), weight 129 lb (58.5 kg), last menstrual period 02/21/1966.  Physical Exam Physical Exam  Constitutional: She is oriented to person, place, and time. She appears well-developed and well-nourished.  Eyes: Conjunctivae are normal. No scleral icterus.  Neck: Neck supple.  Cardiovascular: Normal rate, regular  rhythm and normal heart sounds.  Pulmonary/Chest: Effort normal and breath sounds normal. Right breast exhibits no inverted nipple, no mass, no nipple discharge, no skin change and no tenderness. Left breast exhibits no inverted nipple, no mass, no nipple discharge, no skin change and no tenderness.  Abdominal: Soft. Bowel sounds are normal. There is no tenderness. No hernia.  Lymphadenopathy:    She has no cervical adenopathy.    She has no axillary adenopathy.  Neurological: She is alert and oriented to person, place, and time.  Skin: Skin is warm and dry.    Data Reviewed Bilateral mammograms dated May 03, 2017 were reviewed.  No interval change.   BI-RADS-1.  Assessment    Benign breast exam.    Plan Return as needed. The patient is aware to call back for any questions or concerns.  Annual mammograms and clinical exam with her PCP.  HPI, Physical Exam, Assessment and Plan have been scribed under the direction and in the presence of Hervey Ard, MD.  Gaspar Cola, CMA  I have completed the exam and reviewed the above documentation for accuracy and completeness.  I agree with the above.  Haematologist has been used and any errors in dictation or transcription are unintentional.  Hervey Ard, M.D., F.A.C.S.   Forest Gleason Talley Casco 05/11/2017, 5:16 PM

## 2017-05-10 NOTE — Patient Instructions (Addendum)
Patient to return as needed. The patient is aware to call back for any questions or concerns. 

## 2017-05-26 ENCOUNTER — Other Ambulatory Visit: Payer: Self-pay

## 2017-05-26 DIAGNOSIS — M1711 Unilateral primary osteoarthritis, right knee: Secondary | ICD-10-CM | POA: Diagnosis not present

## 2017-05-26 DIAGNOSIS — M25471 Effusion, right ankle: Secondary | ICD-10-CM | POA: Diagnosis not present

## 2017-05-26 MED ORDER — SPIRONOLACTONE 25 MG PO TABS: 12.5000 mg | ORAL_TABLET | Freq: Every day | ORAL | 3 refills | Status: DC

## 2017-06-07 DIAGNOSIS — M25561 Pain in right knee: Secondary | ICD-10-CM | POA: Diagnosis not present

## 2017-06-07 DIAGNOSIS — R262 Difficulty in walking, not elsewhere classified: Secondary | ICD-10-CM | POA: Diagnosis not present

## 2017-06-07 DIAGNOSIS — M6281 Muscle weakness (generalized): Secondary | ICD-10-CM | POA: Diagnosis not present

## 2017-06-10 DIAGNOSIS — M6281 Muscle weakness (generalized): Secondary | ICD-10-CM | POA: Diagnosis not present

## 2017-06-10 DIAGNOSIS — M25561 Pain in right knee: Secondary | ICD-10-CM | POA: Diagnosis not present

## 2017-06-10 DIAGNOSIS — R262 Difficulty in walking, not elsewhere classified: Secondary | ICD-10-CM | POA: Diagnosis not present

## 2017-06-13 DIAGNOSIS — M6281 Muscle weakness (generalized): Secondary | ICD-10-CM | POA: Diagnosis not present

## 2017-06-13 DIAGNOSIS — M25561 Pain in right knee: Secondary | ICD-10-CM | POA: Diagnosis not present

## 2017-06-13 DIAGNOSIS — R262 Difficulty in walking, not elsewhere classified: Secondary | ICD-10-CM | POA: Diagnosis not present

## 2017-06-22 DIAGNOSIS — R262 Difficulty in walking, not elsewhere classified: Secondary | ICD-10-CM | POA: Diagnosis not present

## 2017-06-22 DIAGNOSIS — M25561 Pain in right knee: Secondary | ICD-10-CM | POA: Diagnosis not present

## 2017-06-22 DIAGNOSIS — M6281 Muscle weakness (generalized): Secondary | ICD-10-CM | POA: Diagnosis not present

## 2017-06-24 DIAGNOSIS — R262 Difficulty in walking, not elsewhere classified: Secondary | ICD-10-CM | POA: Diagnosis not present

## 2017-06-24 DIAGNOSIS — M6281 Muscle weakness (generalized): Secondary | ICD-10-CM | POA: Diagnosis not present

## 2017-06-24 DIAGNOSIS — M25561 Pain in right knee: Secondary | ICD-10-CM | POA: Diagnosis not present

## 2017-06-27 DIAGNOSIS — R262 Difficulty in walking, not elsewhere classified: Secondary | ICD-10-CM | POA: Diagnosis not present

## 2017-06-27 DIAGNOSIS — M25561 Pain in right knee: Secondary | ICD-10-CM | POA: Diagnosis not present

## 2017-06-27 DIAGNOSIS — M6281 Muscle weakness (generalized): Secondary | ICD-10-CM | POA: Diagnosis not present

## 2017-06-28 ENCOUNTER — Other Ambulatory Visit: Payer: Self-pay | Admitting: Cardiovascular Disease

## 2017-07-01 DIAGNOSIS — M25561 Pain in right knee: Secondary | ICD-10-CM | POA: Diagnosis not present

## 2017-07-01 DIAGNOSIS — M6281 Muscle weakness (generalized): Secondary | ICD-10-CM | POA: Diagnosis not present

## 2017-07-01 DIAGNOSIS — R262 Difficulty in walking, not elsewhere classified: Secondary | ICD-10-CM | POA: Diagnosis not present

## 2017-07-04 DIAGNOSIS — M25561 Pain in right knee: Secondary | ICD-10-CM | POA: Diagnosis not present

## 2017-07-19 ENCOUNTER — Other Ambulatory Visit (INDEPENDENT_AMBULATORY_CARE_PROVIDER_SITE_OTHER): Payer: Medicare HMO

## 2017-07-19 DIAGNOSIS — D72819 Decreased white blood cell count, unspecified: Secondary | ICD-10-CM

## 2017-07-19 DIAGNOSIS — E7800 Pure hypercholesterolemia, unspecified: Secondary | ICD-10-CM

## 2017-07-19 DIAGNOSIS — R208 Other disturbances of skin sensation: Secondary | ICD-10-CM

## 2017-07-19 LAB — HEPATIC FUNCTION PANEL
ALK PHOS: 64 U/L (ref 39–117)
ALT: 12 U/L (ref 0–35)
AST: 18 U/L (ref 0–37)
Albumin: 4.3 g/dL (ref 3.5–5.2)
BILIRUBIN DIRECT: 0.1 mg/dL (ref 0.0–0.3)
BILIRUBIN TOTAL: 0.5 mg/dL (ref 0.2–1.2)
TOTAL PROTEIN: 7.2 g/dL (ref 6.0–8.3)

## 2017-07-19 LAB — BASIC METABOLIC PANEL
BUN: 18 mg/dL (ref 6–23)
CALCIUM: 9.8 mg/dL (ref 8.4–10.5)
CO2: 33 meq/L — AB (ref 19–32)
CREATININE: 0.86 mg/dL (ref 0.40–1.20)
Chloride: 100 mEq/L (ref 96–112)
GFR: 80.21 mL/min (ref >60.00)
GLUCOSE: 100 mg/dL — AB (ref 70–99)
Potassium: 4.1 mEq/L (ref 3.5–5.1)
Sodium: 141 mEq/L (ref 135–145)

## 2017-07-19 LAB — CBC WITH DIFFERENTIAL/PLATELET
BASOS PCT: 0.4 % (ref 0.0–3.0)
Basophils Absolute: 0 10*3/uL (ref 0.0–0.1)
EOS PCT: 0.2 % (ref 0.0–5.0)
Eosinophils Absolute: 0 10*3/uL (ref 0.0–0.7)
HEMATOCRIT: 38.9 % (ref 36.0–46.0)
Hemoglobin: 13.2 g/dL (ref 12.0–15.0)
LYMPHS ABS: 1.4 10*3/uL (ref 0.7–4.0)
Lymphocytes Relative: 39.8 % (ref 12.0–46.0)
MCHC: 33.9 g/dL (ref 30.0–36.0)
MCV: 90.5 fl (ref 78.0–100.0)
MONOS PCT: 11.4 % (ref 3.0–12.0)
Monocytes Absolute: 0.4 10*3/uL (ref 0.1–1.0)
NEUTROS ABS: 1.7 10*3/uL (ref 1.4–7.7)
Neutrophils Relative %: 48.2 % (ref 43.0–77.0)
PLATELETS: 213 10*3/uL (ref 150.0–400.0)
RBC: 4.3 Mil/uL (ref 3.87–5.11)
RDW: 13.4 % (ref 11.5–15.5)
WBC: 3.4 10*3/uL — ABNORMAL LOW (ref 4.0–10.5)

## 2017-07-19 LAB — LIPID PANEL
Cholesterol: 222 mg/dL — ABNORMAL HIGH (ref 0–200)
HDL: 82.7 mg/dL (ref >39.00)
LDL Cholesterol: 130 mg/dL — ABNORMAL HIGH (ref 0–99)
NONHDL: 139.34
TRIGLYCERIDES: 49 mg/dL (ref 0.0–149.0)
Total CHOL/HDL Ratio: 3
VLDL: 9.8 mg/dL (ref 0.0–40.0)

## 2017-07-19 LAB — VITAMIN B12: Vitamin B-12: 541 pg/mL (ref 211–911)

## 2017-07-21 ENCOUNTER — Encounter: Payer: Self-pay | Admitting: Internal Medicine

## 2017-07-21 ENCOUNTER — Ambulatory Visit: Payer: Medicare HMO | Admitting: Internal Medicine

## 2017-07-21 DIAGNOSIS — D72819 Decreased white blood cell count, unspecified: Secondary | ICD-10-CM

## 2017-07-21 DIAGNOSIS — J4520 Mild intermittent asthma, uncomplicated: Secondary | ICD-10-CM | POA: Diagnosis not present

## 2017-07-21 DIAGNOSIS — I10 Essential (primary) hypertension: Secondary | ICD-10-CM

## 2017-07-21 DIAGNOSIS — I5022 Chronic systolic (congestive) heart failure: Secondary | ICD-10-CM | POA: Diagnosis not present

## 2017-07-21 DIAGNOSIS — I110 Hypertensive heart disease with heart failure: Secondary | ICD-10-CM | POA: Diagnosis not present

## 2017-07-21 DIAGNOSIS — M25561 Pain in right knee: Secondary | ICD-10-CM | POA: Diagnosis not present

## 2017-07-21 DIAGNOSIS — E7800 Pure hypercholesterolemia, unspecified: Secondary | ICD-10-CM

## 2017-07-21 DIAGNOSIS — Z85038 Personal history of other malignant neoplasm of large intestine: Secondary | ICD-10-CM | POA: Diagnosis not present

## 2017-07-21 MED ORDER — ROSUVASTATIN CALCIUM 10 MG PO TABS: 10.0000 mg | ORAL_TABLET | Freq: Every day | ORAL | 2 refills | Status: DC

## 2017-07-21 MED ORDER — ALBUTEROL SULFATE HFA 108 (90 BASE) MCG/ACT IN AERS: 2.0000 | INHALATION_SPRAY | Freq: Four times a day (QID) | RESPIRATORY_TRACT | 1 refills | Status: DC | PRN

## 2017-07-21 NOTE — Progress Notes (Signed)
[name], female   DOB: 1928/11/01, 84 y.o.   MRN: [id number]   Subjective:    [name], female    DOB: 20-Feb-1928, 84 y.o.   MRN: [id number]  HPI  Patient here for a scheduled follow up.  She reports she has been doing relatively well.  Has been having problems with her knee.  Went to therapy.  Doing exercises.  Some pain still in her knee and into the top of her calf.  No swelling or redness.  Exercises help.  Does not limit her activity.  She does report she gets winded when walking to the mailbox, etc.  She does not feel this has changed.  Overall feels her breathing is stable.  No chest pain.  No significant increased cough or congestion.  No acid reflux reported.  No abdominal pain.  Bowels moving.  No increased stress.  Discussed labs.  Discussed cholesterol risk and starting a cholesterol medication.     Past Medical History:  Diagnosis Date  . Asthma   . Chronic systolic CHF (congestive heart failure) ([place])    a. 01/2015 Echo: EF 25-30%, sev diff HK, mild to mod MR, mildly dil LA, nl RV fxn, PASP 61 mmHg.  . Colon cancer ([place]) 2005  . Hypercholesterolemia   . Hypertension   . Hypertensive heart disease with CHF (congestive heart failure) ([place])   . NICM (nonischemic cardiomyopathy) ([place])    a. 01/2015 Echo: EF 25-30% sev diff HK;  b. 01/2015 Lexi MV: EF 19%, small defect of mild severity in apex - likely breast attenuation, no ischemia.  Marland Kitchen NSVT (nonsustained ventricular tachycardia) ([place])   . Osteoporosis    Past Surgical History:  Procedure Laterality Date  . ABDOMINAL HYSTERECTOMY    . BREAST BIOPSY Left 4/15`   BENIGN BREAST EPITHELIUM WITH NODULAR FIBROSIS.  Marland Kitchen INGUINAL HERNIA REPAIR Left 05/04/2016   Procedure: HERNIA REPAIR INGUINAL ADULT;  Surgeon: Robert Bellow, MD;  Location: [facility] ORS;  Service: General;  Laterality: Left;  . THYROIDECTOMY, PARTIAL  1956  . TUBAL LIGATION     Family History  Problem Relation Age of Onset  . Stroke  Mother   . Colon cancer Father   . Breast cancer Daughter 50  . Breast cancer Cousin    Social History   Socioeconomic History  . Marital status: Widowed    Spouse name: Not on file  . Number of children: Not on file  . Years of education: Not on file  . Highest education level: Not on file  Occupational History  . Not on file  Social Needs  . Financial resource strain: Not on file  . Food insecurity:    Worry: Not on file    Inability: Not on file  . Transportation needs:    Medical: Not on file    Non-medical: Not on file  Tobacco Use  . Smoking status: Never Smoker  . Smokeless tobacco: Never Used  Substance and Sexual Activity  . Alcohol use: No    Alcohol/week: 0.0 oz  . Drug use: No  . Sexual activity: Not on file  Lifestyle  . Physical activity:    Days per week: Not on file    Minutes per session: Not on file  . Stress: Not on file  Relationships  . Social connections:    Talks on phone: Not on file    Gets together: Not on file    Attends religious service: Not on file  Active member of club or organization: Not on file    Attends meetings of clubs or organizations: Not on file    Relationship status: Not on file  Other Topics Concern  . Not on file  Social History Narrative  . Not on file    Outpatient Encounter Medications as of 07/21/2017  Medication Sig  . acetaminophen (TYLENOL) 325 MG tablet Take 650 mg by mouth every 6 (six) hours as needed.  Marland Kitchen albuterol (PROVENTIL HFA;VENTOLIN HFA) 108 (90 Base) MCG/ACT inhaler Inhale 2 puffs into the lungs every 6 (six) hours as needed for wheezing or shortness of breath.  Marland Kitchen aspirin (ASPIRIN EC) 81 MG EC tablet Take 81 mg by mouth daily. Swallow whole.  . Cholecalciferol (VITAMIN D-3) 1000 UNITS CAPS Take 2,000 Units by mouth daily.  Marland Kitchen ENTRESTO 97-103 MG Take 1 tablet by mouth 2 (two) times daily.  . furosemide (LASIX) 40 MG tablet Take 1 tablet (40 mg total) by mouth daily.  . isosorbide mononitrate  (IMDUR) 30 MG 24 hr tablet Take 1 tablet (30 mg total) by mouth daily.  . metoprolol succinate (TOPROL XL) 25 MG 24 hr tablet Take 1 tablet (25 mg total) by mouth daily.  . rosuvastatin (CRESTOR) 10 MG tablet Take 1 tablet (10 mg total) by mouth daily.  Marland Kitchen spironolactone (ALDACTONE) 25 MG tablet Take 0.5 tablets (12.5 mg total) by mouth daily.   No facility-administered encounter medications on file as of 07/21/2017.     Review of Systems  Constitutional: Negative for appetite change and unexpected weight change.  HENT: Negative for congestion and sinus pressure.   Respiratory: Negative for chest tightness.        No significant cough.  Feels breathing is stable.  Stable windedness with walking to mailbox.    Cardiovascular: Negative for chest pain, palpitations and leg swelling.  Gastrointestinal: Negative for abdominal pain, diarrhea, nausea and vomiting.  Genitourinary: Negative for difficulty urinating and dysuria.  Musculoskeletal: Negative for myalgias.       Knee pain as outlined.    Skin: Negative for color change and rash.  Neurological: Negative for dizziness, light-headedness and headaches.  Psychiatric/Behavioral: Negative for agitation and dysphoric mood.       Objective:     Blood pressure rechecked by me:  114/78  Physical Exam  Constitutional: She appears well-developed and well-nourished. No distress.  HENT:  Nose: Nose normal.  Mouth/Throat: Oropharynx is clear and moist.  Neck: Neck supple. No thyromegaly present.  Cardiovascular: Normal rate and regular rhythm.  Pulmonary/Chest: Breath sounds normal. No respiratory distress. She has no wheezes.  Abdominal: Soft. Bowel sounds are normal. There is no tenderness.  Musculoskeletal: She exhibits no edema or tenderness.  Lymphadenopathy:    She has no cervical adenopathy.  Skin: No rash noted. No erythema.  Psychiatric: She has a normal mood and affect. Her behavior is normal.    BP 124/74 (BP Location: Left  Arm, Patient Position: Sitting, Cuff Size: Normal)   Pulse 72   Temp 98.3 F (36.8 C) (Oral)   Resp 18   Wt 131 lb 9.6 oz (59.7 kg)   LMP 02/21/1966   SpO2 92%   BMI 24.07 kg/m  Wt Readings from Last 3 Encounters:  07/21/17 131 lb 9.6 oz (59.7 kg)  05/10/17 129 lb (58.5 kg)  03/23/17 130 lb (59 kg)     Lab Results  Component Value Date   WBC 3.4 (L) 07/19/2017   HGB 13.2 07/19/2017   HCT 38.9 07/19/2017  PLT 213.0 07/19/2017   GLUCOSE 100 (H) 07/19/2017   CHOL 222 (H) 07/19/2017   TRIG 49.0 07/19/2017   HDL 82.70 07/19/2017   LDLDIRECT 128.3 02/08/2013   LDLCALC 130 (H) 07/19/2017   ALT 12 07/19/2017   AST 18 07/19/2017   NA 141 07/19/2017   K 4.1 07/19/2017   CL 100 07/19/2017   CREATININE 0.86 07/19/2017   BUN 18 07/19/2017   CO2 33 (H) 07/19/2017   TSH 0.63 03/21/2017   INR 1.0 12/09/2015   HGBA1C 6.3 09/15/2016    Mm Digital Screening Bilateral  Result Date: 05/04/2017 CLINICAL DATA:  Screening. EXAM: DIGITAL SCREENING BILATERAL MAMMOGRAM WITH CAD COMPARISON:  Previous exam(s). ACR Breast Density Category c: The breast tissue is heterogeneously dense, which may obscure small masses. FINDINGS: There are no findings suspicious for malignancy. Images were processed with CAD. IMPRESSION: No mammographic evidence of malignancy. A result letter of this screening mammogram will be mailed directly to the patient. RECOMMENDATION: Screening mammogram in one year. (Code:SM-B-01Y) BI-RADS CATEGORY  1: Negative. Electronically Signed   By: Nolon Nations M.D.   On: 05/04/2017 13:10       Assessment & Plan:   Problem List Items Addressed This Visit    Asthma    Has been followed by Dr Raul Del.  Overall feels her breathing has been stable.        Relevant Medications   albuterol (PROVENTIL HFA;VENTOLIN HFA) 108 (90 Base) MCG/ACT inhaler   Chronic systolic heart failure (HCC) (Chronic)    No evidence of volume overload on exam today.  Overall stable.  Continue f/u  with cardiology and continue current medication regimen.        Relevant Medications   rosuvastatin (CRESTOR) 10 MG tablet   Essential hypertension    Blood pressure under good control.  Continue same medication regimen.  Follow pressures.  Follow metabolic panel.        Relevant Medications   rosuvastatin (CRESTOR) 10 MG tablet   History of colon cancer    Had colonoscopy 05/2012.  CEA last check 2.6.  Previous check 1.5, but check prior was 2.6.  Recheck CEA with lab.        Relevant Orders   CEA   Hypercholesterolemia    Discussed recent labs and calculated cholesterol risk.  Discussed starting crestor. She is in agreement. Start crestor 10mg  q day.  Follow liver function tests and lipid panel.        Relevant Medications   rosuvastatin (CRESTOR) 10 MG tablet   Other Relevant Orders   Hepatic function panel   Hypertensive heart disease with CHF (congestive heart failure) (HCC)    Stable on current regimen.  Followed by cardiology.        Relevant Medications   rosuvastatin (CRESTOR) 10 MG tablet   Leukopenia    Follow cbc.       Right knee pain    Has seen ortho.  Has been to physical therapy.  Doing exercises at home but no on a regular basis.  Does help.  Home exercise.  Discussed further evaluation. She declines.            Einar Pheasant, MD

## 2017-07-21 NOTE — Patient Instructions (Signed)
Start crestor 10mg  daily.  Take in the evening.

## 2017-07-24 ENCOUNTER — Encounter: Payer: Self-pay | Admitting: Internal Medicine

## 2017-07-24 NOTE — Assessment & Plan Note (Signed)
Blood pressure under good control.  Continue same medication regimen.  Follow pressures.  Follow metabolic panel.   

## 2017-07-24 NOTE — Assessment & Plan Note (Signed)
Had colonoscopy 05/2012.  CEA last check 2.6.  Previous check 1.5, but check prior was 2.6.  Recheck CEA with lab.

## 2017-07-24 NOTE — Assessment & Plan Note (Signed)
Follow cbc.  

## 2017-07-24 NOTE — Assessment & Plan Note (Signed)
Discussed recent labs and calculated cholesterol risk.  Discussed starting crestor. She is in agreement. Start crestor 10mg  q day.  Follow liver function tests and lipid panel.

## 2017-07-24 NOTE — Assessment & Plan Note (Signed)
Stable on current regimen.  Followed by cardiology.   

## 2017-07-24 NOTE — Assessment & Plan Note (Signed)
Has seen ortho.  Has been to physical therapy.  Doing exercises at home but no on a regular basis.  Does help.  Home exercise.  Discussed further evaluation. She declines.

## 2017-07-24 NOTE — Assessment & Plan Note (Signed)
No evidence of volume overload on exam today.  Overall stable.  Continue f/u with cardiology and continue current medication regimen.

## 2017-07-24 NOTE — Assessment & Plan Note (Signed)
Has been followed by Dr Raul Del.  Overall feels her breathing has been stable.

## 2017-08-23 ENCOUNTER — Ambulatory Visit (INDEPENDENT_AMBULATORY_CARE_PROVIDER_SITE_OTHER): Payer: Medicare HMO

## 2017-08-23 ENCOUNTER — Encounter: Payer: Self-pay | Admitting: Internal Medicine

## 2017-08-23 ENCOUNTER — Ambulatory Visit (INDEPENDENT_AMBULATORY_CARE_PROVIDER_SITE_OTHER): Payer: Medicare HMO | Admitting: Internal Medicine

## 2017-08-23 VITALS — BP 125/79 | HR 74 | Temp 98.9°F | Ht 62.0 in | Wt 128.6 lb

## 2017-08-23 DIAGNOSIS — R059 Cough, unspecified: Secondary | ICD-10-CM

## 2017-08-23 DIAGNOSIS — R05 Cough: Secondary | ICD-10-CM | POA: Diagnosis not present

## 2017-08-23 DIAGNOSIS — J069 Acute upper respiratory infection, unspecified: Secondary | ICD-10-CM

## 2017-08-23 MED ORDER — IPRATROPIUM BROMIDE 0.02 % IN SOLN
0.5000 mg | Freq: Once | RESPIRATORY_TRACT | Status: AC
  Administered 2017-08-23: 0.5 mg via RESPIRATORY_TRACT

## 2017-08-23 MED ORDER — ALBUTEROL SULFATE (2.5 MG/3ML) 0.083% IN NEBU
2.5000 mg | INHALATION_SOLUTION | Freq: Once | RESPIRATORY_TRACT | Status: AC
  Administered 2017-08-23: 2.5 mg via RESPIRATORY_TRACT

## 2017-08-23 MED ORDER — AMOXICILLIN-POT CLAVULANATE 875-125 MG PO TABS: 1.0000 | ORAL_TABLET | Freq: Two times a day (BID) | ORAL | 0 refills | Status: DC

## 2017-08-23 NOTE — Progress Notes (Signed)
Pre visit review using our clinic review tool, if applicable. No additional management support is needed unless otherwise documented below in the visit note. 

## 2017-08-23 NOTE — Patient Instructions (Signed)
Take Augmentin 2x per day with food x 1 week   Cough, Adult Coughing is a reflex that clears your throat and your airways. Coughing helps to heal and protect your lungs. It is normal to cough occasionally, but a cough that happens with other symptoms or lasts a long time may be a sign of a condition that needs treatment. A cough may last only 2-3 weeks (acute), or it may last longer than 8 weeks (chronic). What are the causes? Coughing is commonly caused by:  Breathing in substances that irritate your lungs.  A viral or bacterial respiratory infection.  Allergies.  Asthma.  Postnasal drip.  Smoking.  Acid backing up from the stomach into the esophagus (gastroesophageal reflux).  Certain medicines.  Chronic lung problems, including COPD (or rarely, lung cancer).  Other medical conditions such as heart failure.  Follow these instructions at home: Pay attention to any changes in your symptoms. Take these actions to help with your discomfort:  Take medicines only as told by your health care provider. ? If you were prescribed an antibiotic medicine, take it as told by your health care provider. Do not stop taking the antibiotic even if you start to feel better. ? Talk with your health care provider before you take a cough suppressant medicine.  Drink enough fluid to keep your urine clear or pale yellow.  If the air is dry, use a cold steam vaporizer or humidifier in your bedroom or your home to help loosen secretions.  Avoid anything that causes you to cough at work or at home.  If your cough is worse at night, try sleeping in a semi-upright position.  Avoid cigarette smoke. If you smoke, quit smoking. If you need help quitting, ask your health care provider.  Avoid caffeine.  Avoid alcohol.  Rest as needed.  Contact a health care provider if:  You have new symptoms.  You cough up pus.  Your cough does not get better after 2-3 weeks, or your cough gets worse.  You  cannot control your cough with suppressant medicines and you are losing sleep.  You develop pain that is getting worse or pain that is not controlled with pain medicines.  You have a fever.  You have unexplained weight loss.  You have night sweats. Get help right away if:  You cough up blood.  You have difficulty breathing.  Your heartbeat is very fast. This information is not intended to replace advice given to you by your health care provider. Make sure you discuss any questions you have with your health care provider. Document Released: 07/10/2010 Document Revised: 06/19/2015 Document Reviewed: 03/20/2014 Elsevier Interactive Patient Education  [employer].

## 2017-08-23 NOTE — Progress Notes (Signed)
Chief Complaint  Patient presents with  . Cough  . URI   Acute visit  Overall feeling ok c/o cough x 2-3 weeks like something in her chest with productive yellow phelgm nothing tried no sick contacts, no wheezing or fever but she reports her son reported to her she was wheezing though she did not try Albuterol inhaler. Denies dysphagia of food    Review of Systems  Constitutional: Negative for fever.  Eyes: Negative for blurred vision.  Respiratory: Positive for cough and sputum production. Negative for shortness of breath and wheezing.   Cardiovascular: Negative for chest pain.   Past Medical History:  Diagnosis Date  . Asthma   . Chronic systolic CHF (congestive heart failure) ([place])    a. 01/2015 Echo: EF 25-30%, sev diff HK, mild to mod MR, mildly dil LA, nl RV fxn, PASP 61 mmHg.  . Colon cancer ([place]) 2005  . Hypercholesterolemia   . Hypertension   . Hypertensive heart disease with CHF (congestive heart failure) ([place])   . NICM (nonischemic cardiomyopathy) ([place])    a. 01/2015 Echo: EF 25-30% sev diff HK;  b. 01/2015 Lexi MV: EF 19%, small defect of mild severity in apex - likely breast attenuation, no ischemia.  Marland Kitchen NSVT (nonsustained ventricular tachycardia) ([place])   . Osteoporosis    Past Surgical History:  Procedure Laterality Date  . ABDOMINAL HYSTERECTOMY    . BREAST BIOPSY Left 4/15`   BENIGN BREAST EPITHELIUM WITH NODULAR FIBROSIS.  Marland Kitchen INGUINAL HERNIA REPAIR Left 05/04/2016   Procedure: HERNIA REPAIR INGUINAL ADULT;  Surgeon: Robert Bellow, MD;  Location: [facility] ORS;  Service: General;  Laterality: Left;  . THYROIDECTOMY, PARTIAL  1956  . TUBAL LIGATION     Family History  Problem Relation Age of Onset  . Stroke Mother   . Colon cancer Father   . Breast cancer Daughter 44  . Breast cancer Cousin    Social History   Socioeconomic History  . Marital status: Widowed    Spouse name: Not on file  . Number of children: Not on file  . Years of education: Not on file   . Highest education level: Not on file  Occupational History  . Not on file  Social Needs  . Financial resource strain: Not on file  . Food insecurity:    Worry: Not on file    Inability: Not on file  . Transportation needs:    Medical: Not on file    Non-medical: Not on file  Tobacco Use  . Smoking status: Never Smoker  . Smokeless tobacco: Never Used  Substance and Sexual Activity  . Alcohol use: No    Alcohol/week: 0.0 oz  . Drug use: No  . Sexual activity: Not on file  Lifestyle  . Physical activity:    Days per week: Not on file    Minutes per session: Not on file  . Stress: Not on file  Relationships  . Social connections:    Talks on phone: Not on file    Gets together: Not on file    Attends religious service: Not on file    Active member of club or organization: Not on file    Attends meetings of clubs or organizations: Not on file    Relationship status: Not on file  . Intimate partner violence:    Fear of current or ex partner: Not on file    Emotionally abused: Not on file    Physically abused: Not on file  Forced sexual activity: Not on file  Other Topics Concern  . Not on file  Social History Narrative  . Not on file   Current Meds  Medication Sig  . acetaminophen (TYLENOL) 325 MG tablet Take 650 mg by mouth every 6 (six) hours as needed.  Marland Kitchen albuterol (PROVENTIL HFA;VENTOLIN HFA) 108 (90 Base) MCG/ACT inhaler Inhale 2 puffs into the lungs every 6 (six) hours as needed for wheezing or shortness of breath.  Marland Kitchen aspirin (ASPIRIN EC) 81 MG EC tablet Take 81 mg by mouth daily. Swallow whole.  . Cholecalciferol (VITAMIN D-3) 1000 UNITS CAPS Take 2,000 Units by mouth daily.  Marland Kitchen ENTRESTO 97-103 MG Take 1 tablet by mouth 2 (two) times daily.  . furosemide (LASIX) 40 MG tablet Take 1 tablet (40 mg total) by mouth daily.  . metoprolol succinate (TOPROL XL) 25 MG 24 hr tablet Take 1 tablet (25 mg total) by mouth daily.  . rosuvastatin (CRESTOR) 10 MG tablet Take  1 tablet (10 mg total) by mouth daily.  Marland Kitchen spironolactone (ALDACTONE) 25 MG tablet Take 0.5 tablets (12.5 mg total) by mouth daily.   No Known Allergies Recent Results (from the past 2160 hour(s))  Vitamin B12     Status: None   Collection Time: 07/19/17  8:00 AM  Result Value Ref Range   Vitamin B-12 541 211 - 911 pg/mL  Basic metabolic panel     Status: Abnormal   Collection Time: 07/19/17  8:00 AM  Result Value Ref Range   Sodium 141 135 - 145 mEq/L   Potassium 4.1 3.5 - 5.1 mEq/L   Chloride 100 96 - 112 mEq/L   CO2 33 (H) 19 - 32 mEq/L   Glucose, Bld 100 (H) 70 - 99 mg/dL   BUN 18 6 - 23 mg/dL   Creatinine, Ser 0.86 0.40 - 1.20 mg/dL   Calcium 9.8 8.4 - 10.5 mg/dL   GFR 80.21 >60.00 mL/min  Lipid panel     Status: Abnormal   Collection Time: 07/19/17  8:00 AM  Result Value Ref Range   Cholesterol 222 (H) 0 - 200 mg/dL    Comment: ATP III Classification       Desirable:  < 200 mg/dL               Borderline High:  200 - 239 mg/dL          High:  > = 240 mg/dL   Triglycerides 49.0 0.0 - 149.0 mg/dL    Comment: Normal:  <150 mg/dLBorderline High:  150 - 199 mg/dL   HDL 82.70 >39.00 mg/dL   VLDL 9.8 0.0 - 40.0 mg/dL   LDL Cholesterol 130 (H) 0 - 99 mg/dL   Total CHOL/HDL Ratio 3     Comment:                Men          Women1/2 Average Risk     3.4          3.3Average Risk          5.0          4.42X Average Risk          9.6          7.13X Average Risk          15.0          11.0  NonHDL 139.34     Comment: NOTE:  Non-HDL goal should be 30 mg/dL higher than patient's LDL goal (i.e. LDL goal of < 70 mg/dL, would have non-HDL goal of < 100 mg/dL)  Hepatic function panel     Status: None   Collection Time: 07/19/17  8:00 AM  Result Value Ref Range   Total Bilirubin 0.5 0.2 - 1.2 mg/dL   Bilirubin, Direct 0.1 0.0 - 0.3 mg/dL   Alkaline Phosphatase 64 39 - 117 U/L   AST 18 0 - 37 U/L   ALT 12 0 - 35 U/L   Total Protein 7.2 6.0 - 8.3 g/dL   Albumin 4.3 3.5 -  5.2 g/dL  CBC with Differential/Platelet     Status: Abnormal   Collection Time: 07/19/17  8:00 AM  Result Value Ref Range   WBC 3.4 (L) 4.0 - 10.5 K/uL   RBC 4.30 3.87 - 5.11 Mil/uL   Hemoglobin 13.2 12.0 - 15.0 g/dL   HCT 38.9 36.0 - 46.0 %   MCV 90.5 78.0 - 100.0 fl   MCHC 33.9 30.0 - 36.0 g/dL   RDW 13.4 11.5 - 15.5 %   Platelets 213.0 150.0 - 400.0 K/uL   Neutrophils Relative % 48.2 43.0 - 77.0 %   Lymphocytes Relative 39.8 12.0 - 46.0 %   Monocytes Relative 11.4 3.0 - 12.0 %   Eosinophils Relative 0.2 0.0 - 5.0 %   Basophils Relative 0.4 0.0 - 3.0 %   Neutro Abs 1.7 1.4 - 7.7 K/uL   Lymphs Abs 1.4 0.7 - 4.0 K/uL   Monocytes Absolute 0.4 0.1 - 1.0 K/uL   Eosinophils Absolute 0.0 0.0 - 0.7 K/uL   Basophils Absolute 0.0 0.0 - 0.1 K/uL   Objective  Body mass index is 23.52 kg/m. Wt Readings from Last 3 Encounters:  08/23/17 128 lb 9.6 oz (58.3 kg)  07/21/17 131 lb 9.6 oz (59.7 kg)  05/10/17 129 lb (58.5 kg)   Temp Readings from Last 3 Encounters:  08/23/17 98.9 F (37.2 C) (Oral)  07/21/17 98.3 F (36.8 C) (Oral)  03/23/17 98.4 F (36.9 C) (Oral)   BP Readings from Last 3 Encounters:  08/23/17 125/79  07/21/17 124/74  05/10/17 122/66   Pulse Readings from Last 3 Encounters:  08/23/17 74  07/21/17 72  05/10/17 66    Physical Exam  Constitutional: She is oriented to person, place, and time. Vital signs are normal. She appears well-developed and well-nourished. She is cooperative.  HENT:  Head: Normocephalic and atraumatic.  Mouth/Throat: Oropharynx is clear and moist and mucous membranes are normal.  Eyes: Pupils are equal, round, and reactive to light. Conjunctivae are normal.  Cardiovascular: Normal rate, regular rhythm and normal heart sounds.  Pulmonary/Chest: Effort normal and breath sounds normal. She has no wheezes.  Neurological: She is alert and oriented to person, place, and time. Gait normal.  Skin: Skin is warm, dry and intact.  Psychiatric: She  has a normal mood and affect. Her speech is normal and behavior is normal. Judgment and thought content normal. Cognition and memory are normal.  Nursing note and vitals reviewed.   Assessment   1. C/w URI with h/o asthma and cough x 3 weeks r/o pneumonia. No clear asthma exacerbation today Plan  1. Supportive care mucinex dm, robitussin dm cough drops Augmentin bid x 1 week  CXR today  Duoneb x 1  Provider: Dr. Olivia Mackie McLean-Scocuzza-Internal Medicine

## 2017-08-30 ENCOUNTER — Ambulatory Visit (INDEPENDENT_AMBULATORY_CARE_PROVIDER_SITE_OTHER): Payer: Medicare HMO

## 2017-08-30 VITALS — BP 126/82 | HR 70 | Temp 97.7°F | Resp 14 | Ht 63.0 in | Wt 129.8 lb

## 2017-08-30 DIAGNOSIS — E7800 Pure hypercholesterolemia, unspecified: Secondary | ICD-10-CM | POA: Diagnosis not present

## 2017-08-30 DIAGNOSIS — Z0000 Encounter for general adult medical examination without abnormal findings: Secondary | ICD-10-CM | POA: Diagnosis not present

## 2017-08-30 DIAGNOSIS — Z85038 Personal history of other malignant neoplasm of large intestine: Secondary | ICD-10-CM

## 2017-08-30 LAB — HEPATIC FUNCTION PANEL
ALT: 11 U/L (ref 0–35)
AST: 15 U/L (ref 0–37)
Albumin: 4.3 g/dL (ref 3.5–5.2)
Alkaline Phosphatase: 58 U/L (ref 39–117)
BILIRUBIN TOTAL: 0.4 mg/dL (ref 0.2–1.2)
Bilirubin, Direct: 0.1 mg/dL (ref 0.0–0.3)
Total Protein: 7.3 g/dL (ref 6.0–8.3)

## 2017-08-30 NOTE — Patient Instructions (Addendum)
  Kathy Dominguez , Thank you for taking time to come for your Medicare Wellness Visit. I appreciate your ongoing commitment to your health goals. Please review the following plan we discussed and let me know if I can assist you in the future.   Follow up  as needed.    Bring a copy of your [place] and/or Living Will to be scanned into chart once completed.  Have a great day!  These are the goals we discussed: Goals    . Increase physical activity     Silver sneaker program at the gym. Walk for exercise.        This is a list of the screening recommended for you and due dates:  Health Maintenance  Topic Date Due  . Flu Shot  08/25/2017  . DEXA scan (bone density measurement)  08/31/2018*  . Tetanus Vaccine  06/12/2024*  . Pneumonia vaccines (2 of 2 - PCV13) 06/12/2024*  . Mammogram  05/05/2018  *Topic was postponed. The date shown is not the original due date.

## 2017-08-30 NOTE — Progress Notes (Addendum)
Subjective:   Kathy Dominguez is a 84 y.o. female who presents for an Initial Medicare Annual Wellness Visit.  Review of Systems    No ROS.  Medicare Wellness Visit. Additional risk factors are reflected in the social history.  Cardiac Risk Factors include: advanced age (>49men, >30 women);hypertension     Objective:    Today's Vitals   08/30/17 1215  BP: 126/82  Pulse: 70  Resp: 14  Temp: 97.7 F (36.5 C)  TempSrc: Oral  SpO2: 93%  Weight: 129 lb 12.8 oz (58.9 kg)  Height: 5\' 3"  (1.6 m)   Body mass index is 22.99 kg/m.  Advanced Directives 08/30/2017 05/17/2016 05/03/2016 05/02/2016 04/14/2016 03/28/2016 02/16/2016  Does Patient Have a Medical Advance Directive? No No No No No No No  Would patient like information on creating a medical advance directive? Yes (MAU/Ambulatory/Procedural Areas - Information given) - No - Patient declined No - Patient declined - No - Patient declined Yes (Inpatient - patient defers creating a medical advance directive at this time)    Current Medications (verified) Outpatient Encounter Medications as of 08/30/2017  Medication Sig  . acetaminophen (TYLENOL) 325 MG tablet Take 650 mg by mouth every 6 (six) hours as needed.  Marland Kitchen albuterol (PROVENTIL HFA;VENTOLIN HFA) 108 (90 Base) MCG/ACT inhaler Inhale 2 puffs into the lungs every 6 (six) hours as needed for wheezing or shortness of breath.  Marland Kitchen amoxicillin-clavulanate (AUGMENTIN) 875-125 MG tablet Take 1 tablet by mouth 2 (two) times daily.  Marland Kitchen aspirin (ASPIRIN EC) 81 MG EC tablet Take 81 mg by mouth daily. Swallow whole.  . Cholecalciferol (VITAMIN D-3) 1000 UNITS CAPS Take 2,000 Units by mouth daily.  Marland Kitchen ENTRESTO 97-103 MG Take 1 tablet by mouth 2 (two) times daily.  . furosemide (LASIX) 40 MG tablet Take 1 tablet (40 mg total) by mouth daily.  . metoprolol succinate (TOPROL XL) 25 MG 24 hr tablet Take 1 tablet (25 mg total) by mouth daily.  . rosuvastatin (CRESTOR) 10 MG tablet Take 1 tablet (10 mg total)  by mouth daily.  Marland Kitchen spironolactone (ALDACTONE) 25 MG tablet Take 0.5 tablets (12.5 mg total) by mouth daily.  . isosorbide mononitrate (IMDUR) 30 MG 24 hr tablet Take 1 tablet (30 mg total) by mouth daily.   No facility-administered encounter medications on file as of 08/30/2017.     Allergies (verified) Patient has no known allergies.   History: Past Medical History:  Diagnosis Date  . Asthma   . Chronic systolic CHF (congestive heart failure) ([place])    a. 01/2015 Echo: EF 25-30%, sev diff HK, mild to mod MR, mildly dil LA, nl RV fxn, PASP 61 mmHg.  . Colon cancer ([place]) 2005  . Hypercholesterolemia   . Hypertension   . Hypertensive heart disease with CHF (congestive heart failure) ([place])   . NICM (nonischemic cardiomyopathy) ([place])    a. 01/2015 Echo: EF 25-30% sev diff HK;  b. 01/2015 Lexi MV: EF 19%, small defect of mild severity in apex - likely breast attenuation, no ischemia.  Marland Kitchen NSVT (nonsustained ventricular tachycardia) ([place])   . Osteoporosis    Past Surgical History:  Procedure Laterality Date  . ABDOMINAL HYSTERECTOMY    . BREAST BIOPSY Left 4/15`   BENIGN BREAST EPITHELIUM WITH NODULAR FIBROSIS.  Marland Kitchen INGUINAL HERNIA REPAIR Left 05/04/2016   Procedure: HERNIA REPAIR INGUINAL ADULT;  Surgeon: Robert Bellow, MD;  Location: [facility] ORS;  Service: General;  Laterality: Left;  . THYROIDECTOMY, PARTIAL  1956  . TUBAL  LIGATION     Family History  Problem Relation Age of Onset  . Stroke Mother   . Colon cancer Father   . Breast cancer Daughter 59  . Breast cancer Cousin    Social History   Socioeconomic History  . Marital status: Widowed    Spouse name: Not on file  . Number of children: Not on file  . Years of education: Not on file  . Highest education level: Not on file  Occupational History  . Not on file  Social Needs  . Financial resource strain: Not hard at all  . Food insecurity:    Worry: Never true    Inability: Never true  . Transportation needs:     Medical: No    Non-medical: No  Tobacco Use  . Smoking status: Never Smoker  . Smokeless tobacco: Never Used  Substance and Sexual Activity  . Alcohol use: No    Alcohol/week: 0.0 oz  . Drug use: No  . Sexual activity: Not on file  Lifestyle  . Physical activity:    Days per week: 0 days    Minutes per session: Not on file  . Stress: Not at all  Relationships  . Social connections:    Talks on phone: Not on file    Gets together: Not on file    Attends religious service: Not on file    Active member of club or organization: Not on file    Attends meetings of clubs or organizations: Not on file    Relationship status: Not on file  Other Topics Concern  . Not on file  Social History Narrative  . Not on file    Tobacco Counseling Counseling given: Not Answered   Clinical Intake:  Pre-visit preparation completed: Yes  Pain : No/denies pain     Nutritional Status: BMI of 19-24  Normal Diabetes: No  How often do you need to have someone help you when you read instructions, pamphlets, or other written materials from your doctor or pharmacy?: 1 - Never  Interpreter Needed?: No      Activities of Daily Living In your present state of health, do you have any difficulty performing the following activities: 08/30/2017  Hearing? N  Vision? N  Difficulty concentrating or making decisions? N  Walking or climbing stairs? Y  Comment R knee pain, intermittent. Receives injections as appropriate.   Dressing or bathing? N  Doing errands, shopping? N  Preparing Food and eating ? N  Using the Toilet? N  In the past six months, have you accidently leaked urine? N  Do you have problems with loss of bowel control? N  Managing your Medications? N  Managing your Finances? N  Housekeeping or managing your Housekeeping? N  Some recent data might be hidden     Immunizations and Health Maintenance Immunization History  Administered Date(s) Administered  . Influenza, High Dose  Seasonal PF 12/04/2015, 11/23/2016  . Influenza,inj,Quad PF,6+ Mos 02/20/2015  . Pneumococcal-Unspecified 01/26/2003   Health Maintenance Due  Topic Date Due  . INFLUENZA VACCINE  08/25/2017    Patient Care Team: Einar Pheasant, MD as PCP - General (Internal Medicine) Bary Castilla, Forest Gleason, MD (General Surgery) Einar Pheasant, MD (Internal Medicine) Alisa Graff, FNP as Nurse Practitioner (Family Medicine) Erby Pian, MD as Referring Physician (Pulmonary Disease) Minna Merritts, MD as Consulting Physician (Cardiology)  Indicate any recent Medical Services you may have received from other than [facility] providers in the past year (date may  be approximate).     Assessment:   This is a routine wellness examination for [facility].  The goal of the wellness visit is to assist the patient how to close the gaps in care and create a preventative care plan for the patient.   The roster of all physicians providing medical care to patient is listed in the Snapshot section of the chart.  Taking calcium VIT D as appropriate/Osteoporosis risk reviewed.    Safety issues reviewed; Lives alone. Smoke and carbon monoxide detectors in the home. No firearms in the home. Wears seatbelts when driving or riding with others. No violence in the home.  They do not have excessive sun exposure.  Discussed the need for sun protection: hats, long sleeves and the use of sunscreen if there is significant sun exposure.  Patient is alert, normal appearance, oriented to person/place/and time. Correctly identified the president of the [place] and recalls of 3/3 words.Performs simple calculations and can read correct time from watch face. Displays appropriate judgement.  No new identified risk were noted.  No failures at ADL's or IADL's.  Ambulates with cane as needed.    BMI- discussed the importance of a healthy diet, water intake and the benefits of aerobic exercise. Educational material provided.   24 hour  diet recall: Moderate diet, low sodium.   Dental- upper denture. Plans to schedule extraction of lower teeth.  Sleep patterns- Sleeps without issues.   Dexa Scan discussed.   Medications-taking all medications as directed.  Reports no issue with Crestor; started taking 6 weeks ago.   Non fasting labs complete today.   Health maintenance gaps- closed.  Patient Concerns: None at this time. Follow up with PCP as needed.  Hearing/Vision screen Hearing Screening Comments: Patient is able to hear conversational tones without difficulty.  No issues reported.  Vision Screening Comments: Followed by [facility] Wears corrective lenses Annual visits Visual acuity not assessed per patient preference since they have regular follow up with the ophthalmologist  Dietary issues and exercise activities discussed: Current Exercise Habits: Home exercise routine, Type of exercise: stretching(Chair exercise. Repetitive home physical therapy exercise. ), Time (Minutes): 10, Frequency (Times/Week): 2, Weekly Exercise (Minutes/Week): 20, Intensity: Mild  Goals    . Increase physical activity     Silver sneaker program at the gym. Walk for exercise.       Depression Screen PHQ 2/9 Scores 08/30/2017 08/23/2017 05/17/2016 04/14/2016 02/16/2016 02/20/2015 02/12/2014  PHQ - 2 Score 0 0 0 0 0 0 0    Fall Risk Fall Risk  08/30/2017 08/23/2017 07/21/2017 05/17/2016 04/14/2016  Falls in the past year? No No No No No   Cognitive Function:     6CIT Screen 08/30/2017  What Year? 0 points  What month? 0 points  What time? 0 points  Count back from 20 0 points  Months in reverse 0 points  Repeat phrase 0 points  Total Score 0    Screening Tests Health Maintenance  Topic Date Due  . INFLUENZA VACCINE  08/25/2017  . DEXA SCAN  08/31/2018 (Originally 03/12/1995)  . TETANUS/TDAP  06/12/2024 (Originally 03/11/1949)  . PNA vac Low Risk Adult (2 of 2 - PCV13) 06/12/2024 (Originally 01/26/2004)  . MAMMOGRAM   05/05/2018     Plan:    End of life planning; Advance aging; Advanced directives discussed. Copy of current HCPOA/Living Will requested once completed.     I have personally reviewed and noted the following in the patient's chart:   . Medical  and social history . Use of alcohol, tobacco or illicit drugs  . Current medications and supplements . Functional ability and status . Nutritional status . Physical activity . Advanced directives . List of other physicians . Hospitalizations, surgeries, and ER visits in previous 12 months . Vitals . Screenings to include cognitive, depression, and falls . Referrals and appointments  In addition, I have reviewed and discussed with patient certain preventive protocols, quality metrics, and best practice recommendations. A written personalized care plan for preventive services as well as general preventive health recommendations were provided to patient.     OBrien-Blaney, Danetta Prom L, LPN   0/02/5484    Reviewed above information.  Agree with assessment and plan.    Dr Nicki Reaper

## 2017-08-31 LAB — CEA: CEA: 2.2 ng/mL

## 2017-09-01 ENCOUNTER — Other Ambulatory Visit: Payer: Medicare HMO

## 2017-09-12 DIAGNOSIS — L910 Hypertrophic scar: Secondary | ICD-10-CM | POA: Diagnosis not present

## 2017-10-11 ENCOUNTER — Other Ambulatory Visit: Payer: Self-pay

## 2017-10-11 MED ORDER — METOPROLOL SUCCINATE ER 25 MG PO TB24: 25.0000 mg | ORAL_TABLET | Freq: Every day | ORAL | 0 refills | Status: DC

## 2017-11-11 ENCOUNTER — Other Ambulatory Visit: Payer: Self-pay | Admitting: Cardiovascular Disease

## 2017-11-18 ENCOUNTER — Other Ambulatory Visit: Payer: Medicare HMO

## 2017-11-21 ENCOUNTER — Encounter: Payer: Self-pay | Admitting: Internal Medicine

## 2017-11-21 ENCOUNTER — Ambulatory Visit: Payer: Medicare HMO | Admitting: Internal Medicine

## 2017-11-21 VITALS — BP 142/80 | HR 65 | Temp 97.5°F | Resp 18 | Wt 132.4 lb

## 2017-11-21 DIAGNOSIS — Z85038 Personal history of other malignant neoplasm of large intestine: Secondary | ICD-10-CM | POA: Diagnosis not present

## 2017-11-21 DIAGNOSIS — E7800 Pure hypercholesterolemia, unspecified: Secondary | ICD-10-CM | POA: Diagnosis not present

## 2017-11-21 DIAGNOSIS — I110 Hypertensive heart disease with heart failure: Secondary | ICD-10-CM | POA: Diagnosis not present

## 2017-11-21 DIAGNOSIS — R739 Hyperglycemia, unspecified: Secondary | ICD-10-CM | POA: Diagnosis not present

## 2017-11-21 DIAGNOSIS — I5022 Chronic systolic (congestive) heart failure: Secondary | ICD-10-CM

## 2017-11-21 DIAGNOSIS — R7989 Other specified abnormal findings of blood chemistry: Secondary | ICD-10-CM

## 2017-11-21 DIAGNOSIS — J4520 Mild intermittent asthma, uncomplicated: Secondary | ICD-10-CM

## 2017-11-21 DIAGNOSIS — R945 Abnormal results of liver function studies: Secondary | ICD-10-CM

## 2017-11-21 DIAGNOSIS — R208 Other disturbances of skin sensation: Secondary | ICD-10-CM

## 2017-11-21 DIAGNOSIS — I10 Essential (primary) hypertension: Secondary | ICD-10-CM

## 2017-11-21 DIAGNOSIS — Z23 Encounter for immunization: Secondary | ICD-10-CM | POA: Diagnosis not present

## 2017-11-21 LAB — BASIC METABOLIC PANEL
BUN: 20 mg/dL (ref 6–23)
CO2: 33 mEq/L — ABNORMAL HIGH (ref 19–32)
Calcium: 9.5 mg/dL (ref 8.4–10.5)
Chloride: 102 mEq/L (ref 96–112)
Creatinine, Ser: 0.98 mg/dL (ref 0.40–1.20)
GFR: 68.93 mL/min (ref >60.00)
Glucose, Bld: 96 mg/dL (ref 70–99)
Potassium: 4.4 mEq/L (ref 3.5–5.1)
Sodium: 142 mEq/L (ref 135–145)

## 2017-11-21 LAB — HEPATIC FUNCTION PANEL
ALT: 14 U/L (ref 0–35)
AST: 18 U/L (ref 0–37)
Albumin: 4.3 g/dL (ref 3.5–5.2)
Alkaline Phosphatase: 58 U/L (ref 39–117)
Bilirubin, Direct: 0.1 mg/dL (ref 0.0–0.3)
Total Bilirubin: 0.4 mg/dL (ref 0.2–1.2)
Total Protein: 7 g/dL (ref 6.0–8.3)

## 2017-11-21 LAB — CBC WITH DIFFERENTIAL/PLATELET
BASOS PCT: 0.4 % (ref 0.0–3.0)
Basophils Absolute: 0 10*3/uL (ref 0.0–0.1)
EOS ABS: 0 10*3/uL (ref 0.0–0.7)
Eosinophils Relative: 0.2 % (ref 0.0–5.0)
HCT: 38.4 % (ref 36.0–46.0)
Hemoglobin: 12.9 g/dL (ref 12.0–15.0)
LYMPHS ABS: 1.4 10*3/uL (ref 0.7–4.0)
Lymphocytes Relative: 40.8 % (ref 12.0–46.0)
MCHC: 33.6 g/dL (ref 30.0–36.0)
MCV: 91.8 fl (ref 78.0–100.0)
MONO ABS: 0.5 10*3/uL (ref 0.1–1.0)
Monocytes Relative: 13.5 % — ABNORMAL HIGH (ref 3.0–12.0)
NEUTROS ABS: 1.5 10*3/uL (ref 1.4–7.7)
NEUTROS PCT: 45.1 % (ref 43.0–77.0)
Platelets: 187 10*3/uL (ref 150.0–400.0)
RBC: 4.19 Mil/uL (ref 3.87–5.11)
RDW: 13.2 % (ref 11.5–15.5)
WBC: 3.4 10*3/uL — AB (ref 4.0–10.5)

## 2017-11-21 LAB — LIPID PANEL
CHOL/HDL RATIO: 2
Cholesterol: 151 mg/dL (ref 0–200)
HDL: 77.5 mg/dL (ref >39.00)
LDL CALC: 64 mg/dL (ref 0–99)
NonHDL: 73.68
TRIGLYCERIDES: 46 mg/dL (ref 0.0–149.0)
VLDL: 9.2 mg/dL (ref 0.0–40.0)

## 2017-11-21 LAB — HEMOGLOBIN A1C: Hgb A1c MFr Bld: 6.1 % (ref 4.6–6.5)

## 2017-11-21 NOTE — Progress Notes (Signed)
[name], female   DOB: Apr 30, 1928, 84 y.o.   MRN: [id number]   Subjective:    [name], female    DOB: 11-Oct-1928, 84 y.o.   MRN: [id number]  HPI  Patient here for a scheduled follow up.  Has osteoarthritis of knee.  Saw ortho.  Went to therapy.  Is better.  Tries to stay active.  No chest pain.  Breathing stable.  No acid reflux.  No abdominal pain.  Bowels moving.  No urine change.  Describes some burning in her feet at times.  No pain.  Overdue f/u with Dr Rockey Situ.     Past Medical History:  Diagnosis Date  . Asthma   . Chronic systolic CHF (congestive heart failure) ([place])    a. 01/2015 Echo: EF 25-30%, sev diff HK, mild to mod MR, mildly dil LA, nl RV fxn, PASP 61 mmHg.  . Colon cancer ([place]) 2005  . Hypercholesterolemia   . Hypertension   . Hypertensive heart disease with CHF (congestive heart failure) ([place])   . NICM (nonischemic cardiomyopathy) ([place])    a. 01/2015 Echo: EF 25-30% sev diff HK;  b. 01/2015 Lexi MV: EF 19%, small defect of mild severity in apex - likely breast attenuation, no ischemia.  Marland Kitchen NSVT (nonsustained ventricular tachycardia) ([place])   . Osteoporosis    Past Surgical History:  Procedure Laterality Date  . ABDOMINAL HYSTERECTOMY    . BREAST BIOPSY Left 4/15`   BENIGN BREAST EPITHELIUM WITH NODULAR FIBROSIS.  Marland Kitchen INGUINAL HERNIA REPAIR Left 05/04/2016   Procedure: HERNIA REPAIR INGUINAL ADULT;  Surgeon: Robert Bellow, MD;  Location: [facility] ORS;  Service: General;  Laterality: Left;  . THYROIDECTOMY, PARTIAL  1956  . TUBAL LIGATION     Family History  Problem Relation Age of Onset  . Stroke Mother   . Colon cancer Father   . Breast cancer Daughter 74  . Breast cancer Cousin    Social History   Socioeconomic History  . Marital status: Widowed    Spouse name: Not on file  . Number of children: Not on file  . Years of education: Not on file  . Highest education level: Not on file  Occupational History  . Not on file    Social Needs  . Financial resource strain: Not hard at all  . Food insecurity:    Worry: Never true    Inability: Never true  . Transportation needs:    Medical: No    Non-medical: No  Tobacco Use  . Smoking status: Never Smoker  . Smokeless tobacco: Never Used  Substance and Sexual Activity  . Alcohol use: No    Alcohol/week: 0.0 standard drinks  . Drug use: No  . Sexual activity: Not on file  Lifestyle  . Physical activity:    Days per week: 0 days    Minutes per session: Not on file  . Stress: Not at all  Relationships  . Social connections:    Talks on phone: Not on file    Gets together: Not on file    Attends religious service: Not on file    Active member of club or organization: Not on file    Attends meetings of clubs or organizations: Not on file    Relationship status: Not on file  Other Topics Concern  . Not on file  Social History Narrative  . Not on file    Outpatient Encounter Medications as of 11/21/2017  Medication Sig  . acetaminophen (  TYLENOL) 325 MG tablet Take 650 mg by mouth every 6 (six) hours as needed.  Marland Kitchen albuterol (PROVENTIL HFA;VENTOLIN HFA) 108 (90 Base) MCG/ACT inhaler Inhale 2 puffs into the lungs every 6 (six) hours as needed for wheezing or shortness of breath.  Marland Kitchen amoxicillin-clavulanate (AUGMENTIN) 875-125 MG tablet Take 1 tablet by mouth 2 (two) times daily.  Marland Kitchen aspirin (ASPIRIN EC) 81 MG EC tablet Take 81 mg by mouth daily. Swallow whole.  . Cholecalciferol (VITAMIN D-3) 1000 UNITS CAPS Take 2,000 Units by mouth daily.  Marland Kitchen ENTRESTO 97-103 MG Take 1 tablet by mouth 2 (two) times daily.  . furosemide (LASIX) 40 MG tablet Take 1 tablet (40 mg total) by mouth daily.  . isosorbide mononitrate (IMDUR) 30 MG 24 hr tablet Take 1 tablet (30 mg total) by mouth daily.  . metoprolol succinate (TOPROL XL) 25 MG 24 hr tablet Take 1 tablet (25 mg total) by mouth daily.  . rosuvastatin (CRESTOR) 10 MG tablet Take 1 tablet (10 mg total) by mouth daily.   Marland Kitchen spironolactone (ALDACTONE) 25 MG tablet Take 0.5 tablets (12.5 mg total) by mouth daily.  . [DISCONTINUED] furosemide (LASIX) 40 MG tablet Take 1 tablet (40 mg total) by mouth daily.   No facility-administered encounter medications on file as of 11/21/2017.     Review of Systems  Constitutional: Negative for appetite change and unexpected weight change.  HENT: Negative for congestion and sinus pressure.   Respiratory: Negative for cough, chest tightness and shortness of breath.   Cardiovascular: Negative for chest pain, palpitations and leg swelling.  Gastrointestinal: Negative for abdominal pain, diarrhea, nausea and vomiting.  Genitourinary: Negative for difficulty urinating and dysuria.  Musculoskeletal: Negative for myalgias.       Knee pain better.   Skin: Negative for color change and rash.  Neurological: Negative for dizziness, light-headedness and headaches.  Psychiatric/Behavioral: Negative for agitation and dysphoric mood.       Objective:    Physical Exam  Constitutional: She appears well-developed and well-nourished. No distress.  HENT:  Nose: Nose normal.  Mouth/Throat: Oropharynx is clear and moist.  Neck: Neck supple. No thyromegaly present.  Cardiovascular: Normal rate and regular rhythm.  Pulmonary/Chest: Breath sounds normal. No respiratory distress. She has no wheezes.  Abdominal: Soft. Bowel sounds are normal. There is no tenderness.  Musculoskeletal: She exhibits no edema or tenderness.  Lymphadenopathy:    She has no cervical adenopathy.  Skin: No rash noted. No erythema.  Psychiatric: She has a normal mood and affect. Her behavior is normal.    BP (!) 142/80 (BP Location: Left Arm, Patient Position: Sitting, Cuff Size: Normal)   Pulse 65   Temp (!) 97.5 F (36.4 C) (Oral)   Resp 18   Wt 132 lb 6.4 oz (60.1 kg)   LMP 02/21/1966   SpO2 92%   BMI 23.45 kg/m  Wt Readings from Last 3 Encounters:  11/21/17 132 lb 6.4 oz (60.1 kg)  08/30/17 129  lb 12.8 oz (58.9 kg)  08/23/17 128 lb 9.6 oz (58.3 kg)     Lab Results  Component Value Date   WBC 3.4 (L) 11/21/2017   HGB 12.9 11/21/2017   HCT 38.4 11/21/2017   PLT 187.0 11/21/2017   GLUCOSE 96 11/21/2017   CHOL 151 11/21/2017   TRIG 46.0 11/21/2017   HDL 77.50 11/21/2017   LDLDIRECT 128.3 02/08/2013   LDLCALC 64 11/21/2017   ALT 14 11/21/2017   AST 18 11/21/2017   NA 142 11/21/2017  K 4.4 11/21/2017   CL 102 11/21/2017   CREATININE 0.98 11/21/2017   BUN 20 11/21/2017   CO2 33 (H) 11/21/2017   TSH 0.63 03/21/2017   INR 1.0 12/09/2015   HGBA1C 6.1 11/21/2017    Mm Digital Screening Bilateral  Result Date: 05/04/2017 CLINICAL DATA:  Screening. EXAM: DIGITAL SCREENING BILATERAL MAMMOGRAM WITH CAD COMPARISON:  Previous exam(s). ACR Breast Density Category c: The breast tissue is heterogeneously dense, which may obscure small masses. FINDINGS: There are no findings suspicious for malignancy. Images were processed with CAD. IMPRESSION: No mammographic evidence of malignancy. A result letter of this screening mammogram will be mailed directly to the patient. RECOMMENDATION: Screening mammogram in one year. (Code:SM-B-01Y) BI-RADS CATEGORY  1: Negative. Electronically Signed   By: Nolon Nations M.D.   On: 05/04/2017 13:10       Assessment & Plan:   Problem List Items Addressed This Visit    Abnormal liver function tests - Primary    Follow liver function tests.        Relevant Orders   Hepatic function panel (Completed)   Asthma    Has been followed by Dr Raul Del.  Breathing stable.        Burning sensation of feet    Start oral B12.        Chronic systolic heart failure (HCC) (Chronic)    No evidence of volume overload on exam.  Overdue f/u with cardiology.  Schedule f/u appt.  Continue current medication.        Relevant Orders   Ambulatory referral to Cardiology   Essential hypertension    Blood pressure has been controlled.  Continue same medication  regimen.  Follow pressures.  Follow metabolic panel.        Relevant Orders   CBC with Differential/Platelet (Completed)   Basic metabolic panel (Completed)   History of colon cancer    Colonoscopy 2014.  CEA 2.2 (08/2017).        Hypercholesterolemia    Discussed calculated cholesterol risk.  She declines cholesterol medication.  Low cholesterol diet and exercise.  Follow lipid panel.        Relevant Orders   Lipid panel (Completed)   Hypertensive heart disease with CHF (congestive heart failure) ([place])    Followed by cardiology.  Stable on current medication.        Relevant Orders   Ambulatory referral to Cardiology    Other Visit Diagnoses    Hyperglycemia       Relevant Orders   Hemoglobin A1c (Completed)   Encounter for immunization       Relevant Orders   Flu vaccine HIGH DOSE PF (Completed)       Einar Pheasant, MD

## 2017-11-21 NOTE — Patient Instructions (Signed)
Start oral B12 1054mcg per day

## 2017-11-26 ENCOUNTER — Encounter: Payer: Self-pay | Admitting: Internal Medicine

## 2017-11-26 DIAGNOSIS — R208 Other disturbances of skin sensation: Secondary | ICD-10-CM | POA: Insufficient documentation

## 2017-11-26 NOTE — Assessment & Plan Note (Signed)
Follow liver function tests.   

## 2017-11-26 NOTE — Assessment & Plan Note (Signed)
Followed by cardiology.  Stable on current medication.

## 2017-11-26 NOTE — Assessment & Plan Note (Signed)
Colonoscopy 2014.  CEA 2.2 (08/2017).

## 2017-11-26 NOTE — Assessment & Plan Note (Signed)
No evidence of volume overload on exam.  Overdue f/u with cardiology.  Schedule f/u appt.  Continue current medication.

## 2017-11-26 NOTE — Assessment & Plan Note (Signed)
Blood pressure has been controlled.  Continue same medication regimen.  Follow pressures.  Follow metabolic panel.

## 2017-11-26 NOTE — Assessment & Plan Note (Signed)
Discussed calculated cholesterol risk.  She declines cholesterol medication.  Low cholesterol diet and exercise.  Follow lipid panel.  

## 2017-11-26 NOTE — Assessment & Plan Note (Signed)
Has been followed by Dr Fleming.  Breathing stable.   

## 2017-11-26 NOTE — Assessment & Plan Note (Signed)
Start oral B12.  

## 2017-12-13 ENCOUNTER — Ambulatory Visit: Payer: Medicare HMO

## 2017-12-13 ENCOUNTER — Telehealth: Payer: Self-pay

## 2017-12-13 ENCOUNTER — Other Ambulatory Visit: Payer: Self-pay | Admitting: Internal Medicine

## 2017-12-13 MED ORDER — FUROSEMIDE 40 MG PO TABS: 40.0000 mg | ORAL_TABLET | Freq: Every day | ORAL | 0 refills | Status: DC

## 2017-12-13 NOTE — Telephone Encounter (Signed)
Refill of furosemide sent to [facility].

## 2017-12-18 NOTE — Progress Notes (Signed)
Cardiology Office Note  Date:  12/19/2017   ID:  Kathy Dominguez, DOB 07-Jan-1929, MRN [id number]  PCP:  Einar Pheasant, MD   Chief Complaint  Patient presents with  . other    6 mo follow up. Medications reviewed verbally.    HPI:  84 y/o ? with a h/o  HTN,   [facility] 01/28/2016 for shortness of breath,  diagnosed with acute systolic CHF  ejection fraction 25-35%, nonischemic 01/2016 myoview showing no ischemia,  echo EF 50 to 55% 09/2016 who presents for follow-up  of her chronic systolic CHF   Feels well, Knee pain, see [facility] Did therapy  Urine urgency Occasionally with accidents Continues to take Lasix 40 daily Denies any lower extremity edema, no shortness of breath Has not gone into the donut hole this year Last year entresto expensive  Trouble cutting the Spironolactone, 12.5 daily  Less active, grandchildren in daycare  Lab work reviewed showing  hemoglobin A1c 6.1 Creatinine 0.98 BUN 20 Total cholesterol 151 LDL 64  EKG personally reviewed by myself on todays visit Shows normal sinus rhythm with rate 67 bpm no significant ST or T wave changes  PMH:   has a past medical history of Asthma, Chronic systolic CHF (congestive heart failure) ([place]), Colon cancer ([place]) (2005), Hypercholesterolemia, Hypertension, Hypertensive heart disease with CHF (congestive heart failure) ([place]), NICM (nonischemic cardiomyopathy) ([place]), NSVT (nonsustained ventricular tachycardia) ([place]), and Osteoporosis.  PSH:    Past Surgical History:  Procedure Laterality Date  . ABDOMINAL HYSTERECTOMY    . BREAST BIOPSY Left 4/15`   BENIGN BREAST EPITHELIUM WITH NODULAR FIBROSIS.  Marland Kitchen INGUINAL HERNIA REPAIR Left 05/04/2016   Procedure: HERNIA REPAIR INGUINAL ADULT;  Surgeon: Robert Bellow, MD;  Location: [facility] ORS;  Service: General;  Laterality: Left;  . THYROIDECTOMY, PARTIAL  1956  . TUBAL LIGATION      Current Outpatient Medications  Medication Sig Dispense Refill  . acetaminophen  (TYLENOL) 325 MG tablet Take 650 mg by mouth every 6 (six) hours as needed.    Marland Kitchen albuterol (PROVENTIL HFA;VENTOLIN HFA) 108 (90 Base) MCG/ACT inhaler Inhale 2 puffs into the lungs every 6 (six) hours as needed for wheezing or shortness of breath. 1 Inhaler 1  . aspirin (ASPIRIN EC) 81 MG EC tablet Take 81 mg by mouth daily. Swallow whole.    . Cholecalciferol (VITAMIN D-3) 1000 UNITS CAPS Take 2,000 Units by mouth daily.    Marland Kitchen ENTRESTO 97-103 MG Take 1 tablet by mouth 2 (two) times daily. 60 tablet 3  . furosemide (LASIX) 40 MG tablet Take 1 tablet (40 mg total) by mouth daily. 30 tablet 0  . isosorbide mononitrate (IMDUR) 30 MG 24 hr tablet Take 1 tablet (30 mg total) by mouth daily. 90 tablet 3  . metoprolol succinate (TOPROL XL) 25 MG 24 hr tablet Take 1 tablet (25 mg total) by mouth daily. 90 tablet 3  . rosuvastatin (CRESTOR) 10 MG tablet Take 1 tablet (10 mg total) by mouth daily. 90 tablet 3  . spironolactone (ALDACTONE) 25 MG tablet Take 1 tablet (25 mg total) by mouth daily. 90 tablet 3   No current facility-administered medications for this visit.      Allergies:   Patient has no known allergies.   Social History:  The patient  reports that she has never smoked. She has never used smokeless tobacco. She reports that she does not drink alcohol or use drugs.   Family History:   family history includes Breast cancer in her  cousin; Breast cancer (age of onset: 25) in her daughter; Colon cancer in her father; Stroke in her mother.    Review of Systems: Review of Systems  Constitutional: Negative.   Respiratory: Negative.   Cardiovascular: Negative.   Gastrointestinal: Negative.   Musculoskeletal: Positive for joint pain.  Neurological: Negative.   Psychiatric/Behavioral: Negative.   All other systems reviewed and are negative.   PHYSICAL EXAM: VS:  BP 140/80 (BP Location: Left Arm, Patient Position: Sitting, Cuff Size: Normal)   Ht 5\' 2"  (1.575 m)   Wt 130 lb 8 oz (59.2 kg)    LMP 02/21/1966   BMI 23.87 kg/m  , BMI Body mass index is 23.87 kg/m. Constitutional:  oriented to person, place, and time. No distress.  HENT:  Head: Grossly normal Eyes:  no discharge. No scleral icterus.  Neck: No JVD, no carotid bruits  Cardiovascular: Regular rate and rhythm, no murmurs appreciated Pulmonary/Chest: Clear to auscultation bilaterally, no wheezes or rails Abdominal: Soft.  no distension.  no tenderness.  Musculoskeletal: Normal range of motion Neurological:  normal muscle tone. Coordination normal. No atrophy Skin: Skin warm and dry Psychiatric: normal affect, pleasant   Recent Labs: 03/21/2017: TSH 0.63 11/21/2017: ALT 14; BUN 20; Creatinine, Ser 0.98; Hemoglobin 12.9; Platelets 187.0; Potassium 4.4; Sodium 142    Lipid Panel Lab Results  Component Value Date   CHOL 151 11/21/2017   HDL 77.50 11/21/2017   LDLCALC 64 11/21/2017   TRIG 46.0 11/21/2017      Wt Readings from Last 3 Encounters:  12/19/17 130 lb 8 oz (59.2 kg)  11/21/17 132 lb 6.4 oz (60.1 kg)  08/30/17 129 lb 12.8 oz (58.9 kg)       ASSESSMENT AND PLAN:  Chronic systolic heart failure (HCC) Appears euvolemic, no new symptoms Trouble cutting spironolactone in half we will increase up to 25 mg daily to avoid cutting Other medications refilled  Essential hypertension Blood pressure mildly elevated today, was rushing Reports blood pressure well controlled at home  Hypercholesterolemia Good numbers, stay on low-dose Crestor  Urinary urgency Recommend she try to empty her bladder rather than waiting until the last minute  Joint pain Knee osteoarthritis, has seen orthopedics, done therapy  Tachycardia Rate well controlled on metoprolol   Total encounter time more than 25 minutes  Greater than 50% was spent in counseling and coordination of care with the patient   Disposition:   F/U 12 months    Orders Placed This Encounter  Procedures  . EKG 12-Lead      Signed, Esmond Plants, M.D., Ph.D. 12/19/2017  [place], [place]

## 2017-12-19 ENCOUNTER — Ambulatory Visit: Payer: Medicare HMO | Admitting: Cardiovascular Disease

## 2017-12-19 ENCOUNTER — Ambulatory Visit (INDEPENDENT_AMBULATORY_CARE_PROVIDER_SITE_OTHER): Payer: Medicare HMO | Admitting: *Deleted

## 2017-12-19 ENCOUNTER — Encounter: Payer: Self-pay | Admitting: Cardiovascular Disease

## 2017-12-19 VITALS — BP 140/80 | Ht 62.0 in | Wt 130.5 lb

## 2017-12-19 DIAGNOSIS — E7800 Pure hypercholesterolemia, unspecified: Secondary | ICD-10-CM | POA: Diagnosis not present

## 2017-12-19 DIAGNOSIS — I5022 Chronic systolic (congestive) heart failure: Secondary | ICD-10-CM | POA: Diagnosis not present

## 2017-12-19 DIAGNOSIS — I10 Essential (primary) hypertension: Secondary | ICD-10-CM | POA: Diagnosis not present

## 2017-12-19 DIAGNOSIS — Z23 Encounter for immunization: Secondary | ICD-10-CM

## 2017-12-19 DIAGNOSIS — I471 Supraventricular tachycardia: Secondary | ICD-10-CM | POA: Diagnosis not present

## 2017-12-19 DIAGNOSIS — I428 Other cardiomyopathies: Secondary | ICD-10-CM

## 2017-12-19 MED ORDER — METOPROLOL SUCCINATE ER 25 MG PO TB24: 25.0000 mg | ORAL_TABLET | Freq: Every day | ORAL | 3 refills | Status: DC

## 2017-12-19 MED ORDER — ISOSORBIDE MONONITRATE ER 30 MG PO TB24: 30.0000 mg | ORAL_TABLET | Freq: Every day | ORAL | 3 refills | Status: DC

## 2017-12-19 MED ORDER — SPIRONOLACTONE 25 MG PO TABS: 25.0000 mg | ORAL_TABLET | Freq: Every day | ORAL | 3 refills | Status: DC

## 2017-12-19 MED ORDER — ROSUVASTATIN CALCIUM 10 MG PO TABS: 10.0000 mg | ORAL_TABLET | Freq: Every day | ORAL | 3 refills | Status: DC

## 2017-12-19 NOTE — Patient Instructions (Signed)
Medication Instructions:   Please increase the spironolactone up to 25 mg daily  If you need a refill on your cardiac medications before your next appointment, please call your pharmacy.    Lab work: No new labs needed   If you have labs (blood work) drawn today and your tests are completely normal, you will receive your results only by: Marland Kitchen MyChart Message (if you have MyChart) OR . A paper copy in the mail If you have any lab test that is abnormal or we need to change your treatment, we will call you to review the results.   Testing/Procedures: No new testing needed   Follow-Up: At [facility], you and your health needs are our priority.  As part of our continuing mission to provide you with exceptional heart care, we have created designated Provider Care Teams.  These Care Teams include your primary Cardiologist (physician) and Advanced Practice Providers (APPs -  Physician Assistants and Nurse Practitioners) who all work together to provide you with the care you need, when you need it.  . You will need a follow up appointment in 12 months .   Please call our office 2 months in advance to schedule this appointment.    . Providers on your designated Care Team:   . Murray Hodgkins, NP . Christell Faith, PA-C . Marrianne Mood, PA-C  Any Other Special Instructions Will Be Listed Below (If Applicable).  For educational health videos Log in to : [email or web address] Or : [email or web address], password : [email or web address]

## 2018-02-02 ENCOUNTER — Other Ambulatory Visit: Payer: Self-pay | Admitting: Cardiovascular Disease

## 2018-02-08 ENCOUNTER — Telehealth: Payer: Self-pay

## 2018-02-08 MED ORDER — FUROSEMIDE 40 MG PO TABS: 40.0000 mg | ORAL_TABLET | Freq: Every day | ORAL | 1 refills | Status: DC

## 2018-02-08 NOTE — Telephone Encounter (Signed)
Refill sent to [facility]

## 2018-03-15 DIAGNOSIS — L910 Hypertrophic scar: Secondary | ICD-10-CM | POA: Diagnosis not present

## 2018-03-23 ENCOUNTER — Ambulatory Visit (INDEPENDENT_AMBULATORY_CARE_PROVIDER_SITE_OTHER): Payer: Medicare HMO | Admitting: Internal Medicine

## 2018-03-23 ENCOUNTER — Encounter: Payer: Self-pay | Admitting: Internal Medicine

## 2018-03-23 VITALS — BP 122/78 | HR 71 | Temp 98.0°F | Resp 16 | Wt 131.2 lb

## 2018-03-23 DIAGNOSIS — D72819 Decreased white blood cell count, unspecified: Secondary | ICD-10-CM | POA: Diagnosis not present

## 2018-03-23 DIAGNOSIS — Z0000 Encounter for general adult medical examination without abnormal findings: Secondary | ICD-10-CM | POA: Diagnosis not present

## 2018-03-23 DIAGNOSIS — Z1239 Encounter for other screening for malignant neoplasm of breast: Secondary | ICD-10-CM

## 2018-03-23 DIAGNOSIS — I5022 Chronic systolic (congestive) heart failure: Secondary | ICD-10-CM | POA: Diagnosis not present

## 2018-03-23 DIAGNOSIS — R7989 Other specified abnormal findings of blood chemistry: Secondary | ICD-10-CM

## 2018-03-23 DIAGNOSIS — R945 Abnormal results of liver function studies: Secondary | ICD-10-CM

## 2018-03-23 DIAGNOSIS — I10 Essential (primary) hypertension: Secondary | ICD-10-CM | POA: Diagnosis not present

## 2018-03-23 DIAGNOSIS — E7800 Pure hypercholesterolemia, unspecified: Secondary | ICD-10-CM | POA: Diagnosis not present

## 2018-03-23 LAB — HEPATIC FUNCTION PANEL
ALBUMIN: 4.4 g/dL (ref 3.5–5.2)
ALT: 11 U/L (ref 0–35)
AST: 18 U/L (ref 0–37)
Alkaline Phosphatase: 64 U/L (ref 39–117)
Bilirubin, Direct: 0.1 mg/dL (ref 0.0–0.3)
TOTAL PROTEIN: 7.3 g/dL (ref 6.0–8.3)
Total Bilirubin: 0.5 mg/dL (ref 0.2–1.2)

## 2018-03-23 LAB — CBC WITH DIFFERENTIAL/PLATELET
Basophils Absolute: 0 10*3/uL (ref 0.0–0.1)
Basophils Relative: 0.4 % (ref 0.0–3.0)
EOS ABS: 0 10*3/uL (ref 0.0–0.7)
Eosinophils Relative: 0.2 % (ref 0.0–5.0)
HEMATOCRIT: 39.7 % (ref 36.0–46.0)
Hemoglobin: 13.4 g/dL (ref 12.0–15.0)
LYMPHS PCT: 32 % (ref 12.0–46.0)
Lymphs Abs: 1.3 10*3/uL (ref 0.7–4.0)
MCHC: 33.8 g/dL (ref 30.0–36.0)
MCV: 90 fl (ref 78.0–100.0)
MONO ABS: 0.4 10*3/uL (ref 0.1–1.0)
Monocytes Relative: 10.3 % (ref 3.0–12.0)
NEUTROS PCT: 57.1 % (ref 43.0–77.0)
Neutro Abs: 2.4 10*3/uL (ref 1.4–7.7)
PLATELETS: 201 10*3/uL (ref 150.0–400.0)
RBC: 4.41 Mil/uL (ref 3.87–5.11)
RDW: 13.1 % (ref 11.5–15.5)
WBC: 4.2 10*3/uL (ref 4.0–10.5)

## 2018-03-23 LAB — LIPID PANEL
CHOLESTEROL: 142 mg/dL (ref 0–200)
HDL: 75.5 mg/dL (ref >39.00)
LDL Cholesterol: 58 mg/dL (ref 0–99)
NonHDL: 66.42
Total CHOL/HDL Ratio: 2
Triglycerides: 42 mg/dL (ref 0.0–149.0)
VLDL: 8.4 mg/dL (ref 0.0–40.0)

## 2018-03-23 LAB — BASIC METABOLIC PANEL
BUN: 23 mg/dL (ref 6–23)
CHLORIDE: 100 meq/L (ref 96–112)
CO2: 33 meq/L — AB (ref 19–32)
Calcium: 9.7 mg/dL (ref 8.4–10.5)
Creatinine, Ser: 0.99 mg/dL (ref 0.40–1.20)
GFR: 64.05 mL/min (ref >60.00)
Glucose, Bld: 90 mg/dL (ref 70–99)
Potassium: 4.1 mEq/L (ref 3.5–5.1)
Sodium: 142 mEq/L (ref 135–145)

## 2018-03-23 LAB — TSH: TSH: 0.38 u[IU]/mL (ref 0.35–4.50)

## 2018-03-23 NOTE — Assessment & Plan Note (Signed)
Physical today 03/23/18.  S/p hysterectomy.  Mammogram 05/04/17 - birads I.  Schedule f/u mammogram.  Colonoscopy 05/2012.

## 2018-03-23 NOTE — Progress Notes (Signed)
[name], female   DOB: Jul 23, 1928, 85 y.o.   MRN: [id number]   Subjective:    [name], female    DOB: 02/24/1928, 85 y.o.   MRN: [id number]  HPI  Patient here for her physical exam.  States she is doing relatively well.  Sees Dr Rockey Situ.  Last evaluated 12/19/17.  He increased her spironolactone to 25mg  q day.  No other changes made.  Felt stable.  Recommended f/u in 12 months.  She reports she is doing relatively well.  Feels her breathing is stable.  No chest pain.  No increased cough or congestion.  No acid reflux.  No abdominal pain.  Bowels moving.  Still having some pain in her right knee - at times.  Went to therapy previously.  Helped. Not doing her exercises regularly.  States if does her exercises, helps her knee.  Desires no further intervention.  Plans to start doing her exercises.  Handling stress.     Past Medical History:  Diagnosis Date  . Asthma   . Chronic systolic CHF (congestive heart failure) ([place])    a. 01/2015 Echo: EF 25-30%, sev diff HK, mild to mod MR, mildly dil LA, nl RV fxn, PASP 61 mmHg.  . Colon cancer ([place]) 2005  . Hypercholesterolemia   . Hypertension   . Hypertensive heart disease with CHF (congestive heart failure) ([place])   . NICM (nonischemic cardiomyopathy) ([place])    a. 01/2015 Echo: EF 25-30% sev diff HK;  b. 01/2015 Lexi MV: EF 19%, small defect of mild severity in apex - likely breast attenuation, no ischemia.  Marland Kitchen NSVT (nonsustained ventricular tachycardia) ([place])   . Osteoporosis    Past Surgical History:  Procedure Laterality Date  . ABDOMINAL HYSTERECTOMY    . BREAST BIOPSY Left 4/15`   BENIGN BREAST EPITHELIUM WITH NODULAR FIBROSIS.  Marland Kitchen INGUINAL HERNIA REPAIR Left 05/04/2016   Procedure: HERNIA REPAIR INGUINAL ADULT;  Surgeon: Robert Bellow, MD;  Location: [facility] ORS;  Service: General;  Laterality: Left;  . THYROIDECTOMY, PARTIAL  1956  . TUBAL LIGATION     Family History  Problem Relation Age of Onset  .  Stroke Mother   . Colon cancer Father   . Breast cancer Daughter 1  . Breast cancer Cousin    Social History   Socioeconomic History  . Marital status: Widowed    Spouse name: Not on file  . Number of children: Not on file  . Years of education: Not on file  . Highest education level: Not on file  Occupational History  . Not on file  Social Needs  . Financial resource strain: Not hard at all  . Food insecurity:    Worry: Never true    Inability: Never true  . Transportation needs:    Medical: No    Non-medical: No  Tobacco Use  . Smoking status: Never Smoker  . Smokeless tobacco: Never Used  Substance and Sexual Activity  . Alcohol use: No    Alcohol/week: 0.0 standard drinks  . Drug use: No  . Sexual activity: Not on file  Lifestyle  . Physical activity:    Days per week: 0 days    Minutes per session: Not on file  . Stress: Not at all  Relationships  . Social connections:    Talks on phone: Not on file    Gets together: Not on file    Attends religious service: Not on file    Active member of  club or organization: Not on file    Attends meetings of clubs or organizations: Not on file    Relationship status: Not on file  Other Topics Concern  . Not on file  Social History Narrative  . Not on file    Outpatient Encounter Medications as of 03/23/2018  Medication Sig  . acetaminophen (TYLENOL) 325 MG tablet Take 650 mg by mouth every 6 (six) hours as needed.  Marland Kitchen albuterol (PROVENTIL HFA;VENTOLIN HFA) 108 (90 Base) MCG/ACT inhaler Inhale 2 puffs into the lungs every 6 (six) hours as needed for wheezing or shortness of breath.  Marland Kitchen aspirin (ASPIRIN EC) 81 MG EC tablet Take 81 mg by mouth daily. Swallow whole.  . Cholecalciferol (VITAMIN D-3) 1000 UNITS CAPS Take 2,000 Units by mouth daily.  Marland Kitchen ENTRESTO 97-103 MG Take 1 tablet by mouth 2 (two) times daily.  . furosemide (LASIX) 40 MG tablet Take 1 tablet (40 mg total) by mouth daily.  . isosorbide mononitrate (IMDUR)  30 MG 24 hr tablet Take 1 tablet (30 mg total) by mouth daily.  . metoprolol succinate (TOPROL XL) 25 MG 24 hr tablet Take 1 tablet (25 mg total) by mouth daily.  . rosuvastatin (CRESTOR) 10 MG tablet Take 1 tablet (10 mg total) by mouth daily.  Marland Kitchen spironolactone (ALDACTONE) 25 MG tablet Take 1 tablet (25 mg total) by mouth daily.   No facility-administered encounter medications on file as of 03/23/2018.     Review of Systems  Constitutional: Negative for appetite change and unexpected weight change.  HENT: Negative for congestion and sinus pressure.   Eyes: Negative for pain and visual disturbance.  Respiratory: Negative for cough and chest tightness.        Breathing stable.    Cardiovascular: Negative for chest pain, palpitations and leg swelling.  Gastrointestinal: Negative for abdominal pain, diarrhea, nausea and vomiting.  Genitourinary: Negative for difficulty urinating and dysuria.  Musculoskeletal: Negative for joint swelling and myalgias.  Skin: Negative for color change and rash.  Neurological: Negative for dizziness, light-headedness and headaches.  Hematological: Negative for adenopathy. Does not bruise/bleed easily.  Psychiatric/Behavioral: Negative for agitation and dysphoric mood.       Objective:    Physical Exam Constitutional:      General: She is not in acute distress.    Appearance: Normal appearance. She is well-developed.  HENT:     Nose: Nose normal. No congestion.     Mouth/Throat:     Pharynx: No oropharyngeal exudate or posterior oropharyngeal erythema.  Eyes:     General: No scleral icterus.       Right eye: No discharge.        Left eye: No discharge.  Neck:     Musculoskeletal: Neck supple. No muscular tenderness.     Thyroid: No thyromegaly.  Cardiovascular:     Rate and Rhythm: Normal rate and regular rhythm.  Pulmonary:     Effort: No tachypnea, accessory muscle usage or respiratory distress.     Breath sounds: Normal breath sounds. No  decreased breath sounds or wheezing.  Chest:     Breasts:        Right: No inverted nipple, mass, nipple discharge or tenderness (no axillary adenopathy).        Left: No inverted nipple, mass, nipple discharge or tenderness (no axilarry adenopathy).  Abdominal:     General: Bowel sounds are normal.     Palpations: Abdomen is soft.     Tenderness: There is no abdominal tenderness.  Musculoskeletal:        General: No swelling or tenderness.  Lymphadenopathy:     Cervical: No cervical adenopathy.  Skin:    Findings: No erythema or rash.  Neurological:     Mental Status: She is alert and oriented to person, place, and time.  Psychiatric:        Mood and Affect: Mood normal.        Behavior: Behavior normal.     BP 122/78   Pulse 71   Temp 98 F (36.7 C) (Oral)   Resp 16   Wt 131 lb 3.2 oz (59.5 kg)   LMP 02/21/1966   SpO2 94%   BMI 24.00 kg/m  Wt Readings from Last 3 Encounters:  03/23/18 131 lb 3.2 oz (59.5 kg)  12/19/17 130 lb 8 oz (59.2 kg)  11/21/17 132 lb 6.4 oz (60.1 kg)     Lab Results  Component Value Date   WBC 4.2 03/23/2018   HGB 13.4 03/23/2018   HCT 39.7 03/23/2018   PLT 201.0 03/23/2018   GLUCOSE 90 03/23/2018   CHOL 142 03/23/2018   TRIG 42.0 03/23/2018   HDL 75.50 03/23/2018   LDLDIRECT 128.3 02/08/2013   LDLCALC 58 03/23/2018   ALT 11 03/23/2018   AST 18 03/23/2018   NA 142 03/23/2018   K 4.1 03/23/2018   CL 100 03/23/2018   CREATININE 0.99 03/23/2018   BUN 23 03/23/2018   CO2 33 (H) 03/23/2018   TSH 0.38 03/23/2018   INR 1.0 12/09/2015   HGBA1C 6.1 11/21/2017    Mm Digital Screening Bilateral  Result Date: 05/04/2017 CLINICAL DATA:  Screening. EXAM: DIGITAL SCREENING BILATERAL MAMMOGRAM WITH CAD COMPARISON:  Previous exam(s). ACR Breast Density Category c: The breast tissue is heterogeneously dense, which may obscure small masses. FINDINGS: There are no findings suspicious for malignancy. Images were processed with CAD. IMPRESSION: No  mammographic evidence of malignancy. A result letter of this screening mammogram will be mailed directly to the patient. RECOMMENDATION: Screening mammogram in one year. (Code:SM-B-01Y) BI-RADS CATEGORY  1: Negative. Electronically Signed   By: Nolon Nations M.D.   On: 05/04/2017 13:10       Assessment & Plan:   Problem List Items Addressed This Visit    Abnormal liver function tests    Follow liver function tests.        Chronic systolic heart failure (HCC) (Chronic)    No evidence of volume overload. Doing well on current regimen.  Followed by cardiology.       Essential hypertension    Blood pressure under good control.  Continue same medication regimen.  Follow pressures.  Follow metabolic panel.        Relevant Orders   TSH (Completed)   Basic metabolic panel (Completed)   Health care maintenance    Physical today 03/23/18.  S/p hysterectomy.  Mammogram 05/04/17 - birads I.  Schedule f/u mammogram.  Colonoscopy 05/2012.        Hypercholesterolemia    Have discussed calculated cholesterol risk.  She is on low dose cholesterol medication.  Tolerating.  Follow lipid panel and liver function tests.        Relevant Orders   Hepatic function panel (Completed)   Lipid panel (Completed)   Leukopenia    Follow cbc.       Relevant Orders   CBC with Differential/Platelet (Completed)    Other Visit Diagnoses    Routine general medical examination at a health care facility    -  Primary   Breast cancer screening       Relevant Orders   MM 3D SCREEN BREAST BILATERAL       Einar Pheasant, MD

## 2018-03-25 ENCOUNTER — Encounter: Payer: Self-pay | Admitting: Internal Medicine

## 2018-03-25 NOTE — Assessment & Plan Note (Signed)
Have discussed calculated cholesterol risk.  She is on low dose cholesterol medication.  Tolerating.  Follow lipid panel and liver function tests.

## 2018-03-25 NOTE — Assessment & Plan Note (Signed)
No evidence of volume overload. Doing well on current regimen.  Followed by cardiology.

## 2018-03-25 NOTE — Assessment & Plan Note (Signed)
Follow liver function tests.   

## 2018-03-25 NOTE — Assessment & Plan Note (Signed)
Blood pressure under good control.  Continue same medication regimen.  Follow pressures.  Follow metabolic panel.   

## 2018-03-25 NOTE — Assessment & Plan Note (Signed)
Follow cbc.  

## 2018-04-11 ENCOUNTER — Other Ambulatory Visit: Payer: Self-pay | Admitting: Cardiovascular Disease

## 2018-05-15 ENCOUNTER — Other Ambulatory Visit: Payer: Self-pay | Admitting: Cardiovascular Disease

## 2018-09-11 ENCOUNTER — Other Ambulatory Visit: Payer: Self-pay | Admitting: Cardiovascular Disease

## 2018-09-21 ENCOUNTER — Ambulatory Visit: Payer: Medicare HMO | Admitting: Internal Medicine

## 2018-09-22 ENCOUNTER — Telehealth: Payer: Self-pay | Admitting: Cardiovascular Disease

## 2018-09-22 NOTE — Telephone Encounter (Signed)
No samples available at this time

## 2018-09-22 NOTE — Telephone Encounter (Signed)
Pt is aware that we don't have any samples. She is aware that we will contact her if we get any samples in or hear from the drug rep.

## 2018-09-22 NOTE — Telephone Encounter (Signed)
Patient calling the office for samples of medication:   1.  What medication and dosage are you requesting samples for? Entresto 97-103 mg po BID   2.  Are you currently out of this medication?  Almost out in donut hole

## 2018-10-05 ENCOUNTER — Telehealth: Payer: Self-pay | Admitting: Internal Medicine

## 2018-10-05 NOTE — Telephone Encounter (Signed)
Pt will get high dose flu shot at appt in October.

## 2018-10-05 NOTE — Telephone Encounter (Signed)
Pt went to pharmacy to get flu shot and they are out of High dose and advised Pt that it would be fine for her to get the regular dose. Pt wants Dr. Lars Mage advice on if she should get the regular dose or not? / please advise

## 2018-10-26 IMAGING — CT CT ABD-PELV W/ CM
2 of 5 series · 16 of 46 positions shown, 18 images · IV contrast (APPLIED)
Comparison: 12/22/2015 aortic ultrasound.

CLINICAL DATA: Left lower quadrant pain starting this afternoon.
Hysterectomy. Colon resection.

EXAM:
CT ABDOMEN AND PELVIS WITH CONTRAST
TECHNIQUE: Multidetector CT imaging of the abdomen and pelvis was performed
using the standard protocol following bolus administration of
intravenous contrast.
CONTRAST:  100mL N4BEU0-O77 IOPAMIDOL (N4BEU0-O77) INJECTION 61%

[Series 2: routine abd/pel with · axial · 0.71mm/px · z∈[-1039,-694]mm · 13 of 79 slices shown, 15 images]
[im 5/79  soft-tissue]
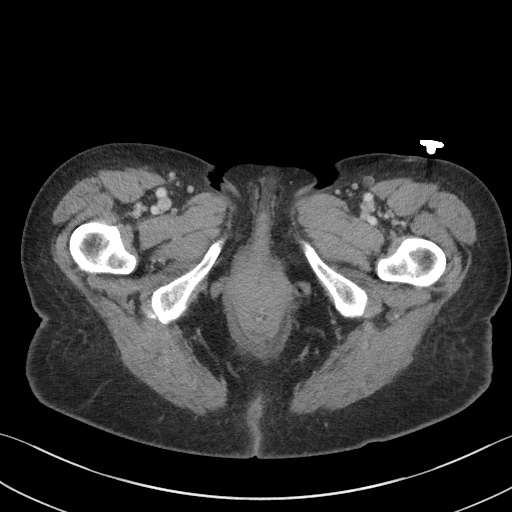
[im 5/79  bone]
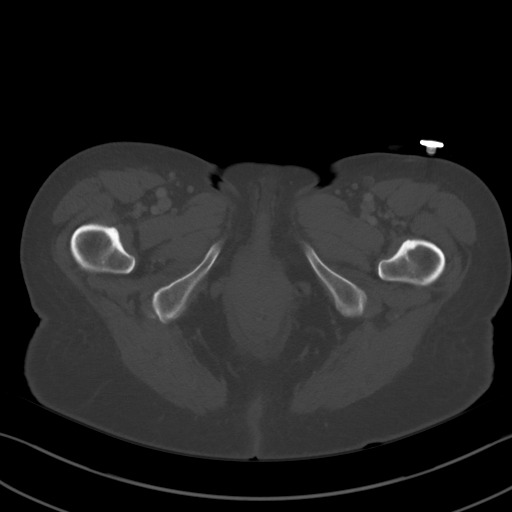
[im 13/79  soft-tissue]
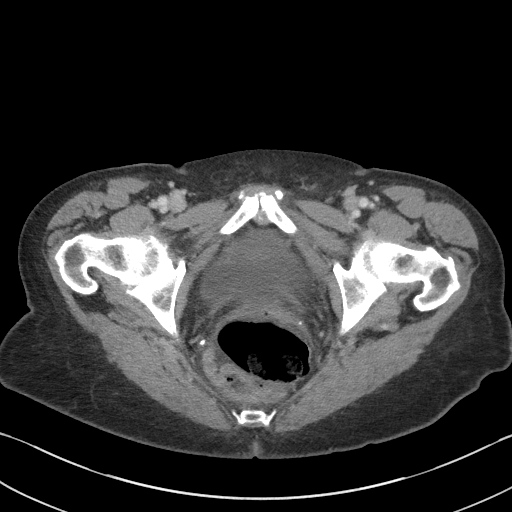
[im 17/79  soft-tissue]
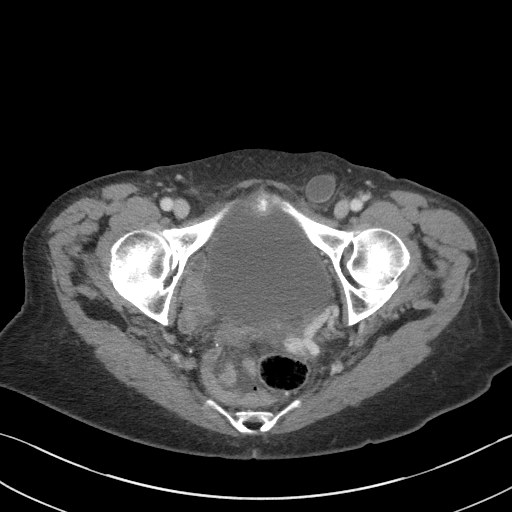
[im 21/79  soft-tissue]
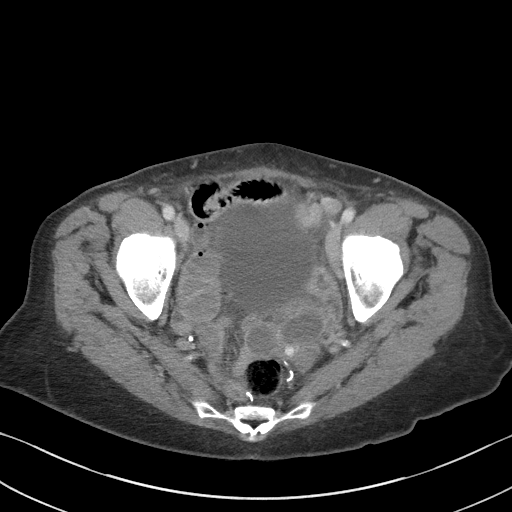
[im 29/79  soft-tissue]
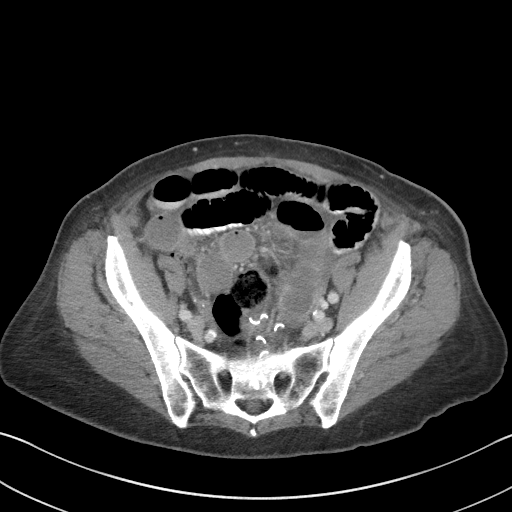
[im 33/79  soft-tissue]
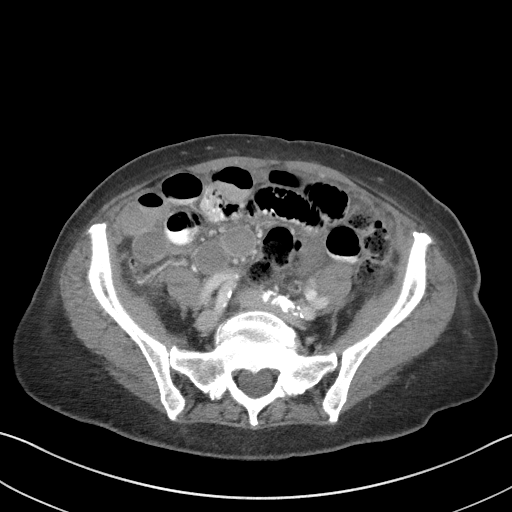
[im 42/79  soft-tissue]
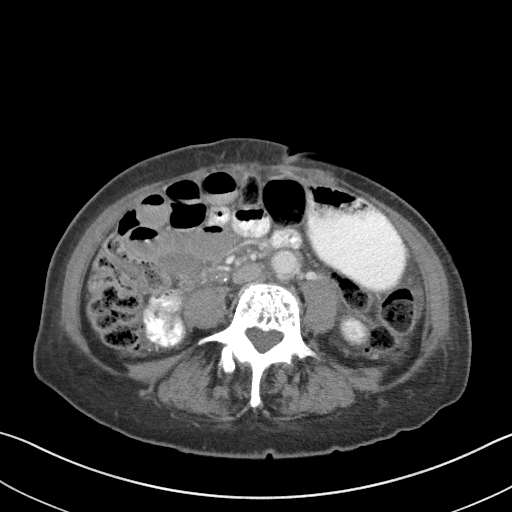
[im 46/79  soft-tissue]
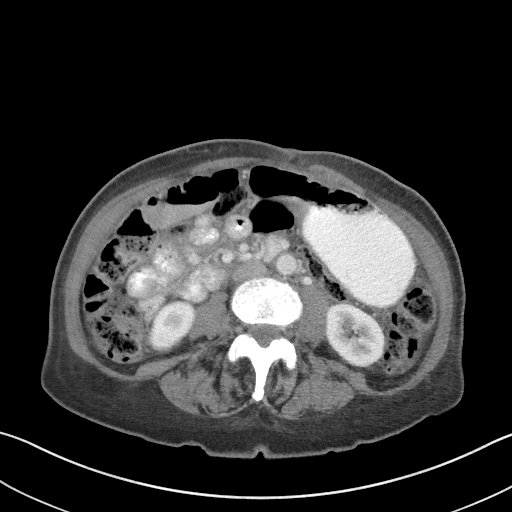
[im 50/79  soft-tissue]
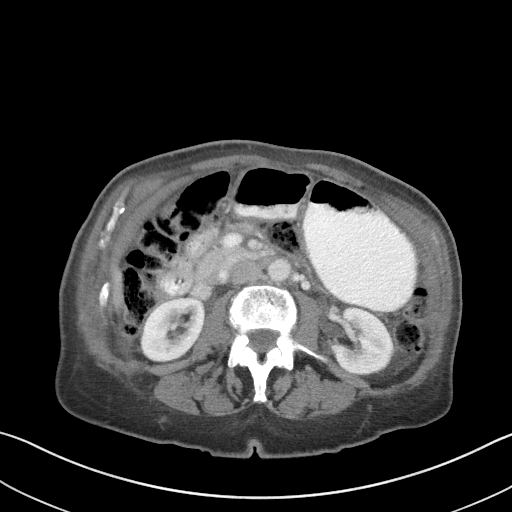
[im 50/79  bone]
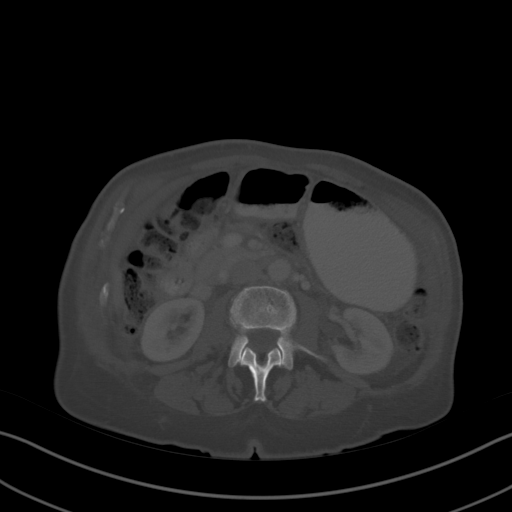
[im 58/79  soft-tissue]
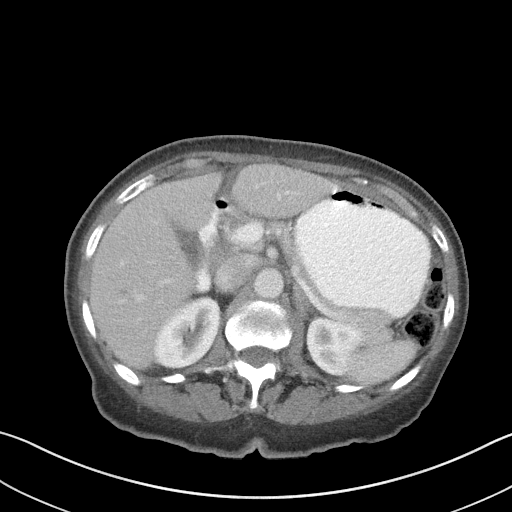
[im 62/79  soft-tissue]
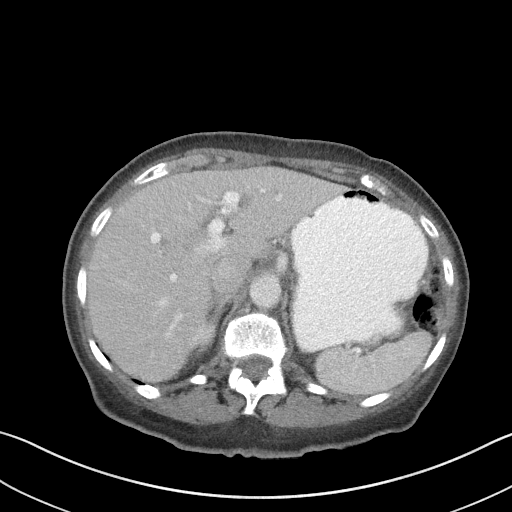
[im 66/79  soft-tissue]
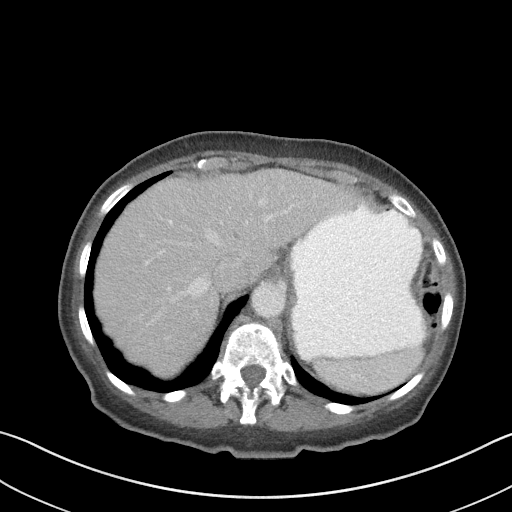
[im 74/79  soft-tissue]
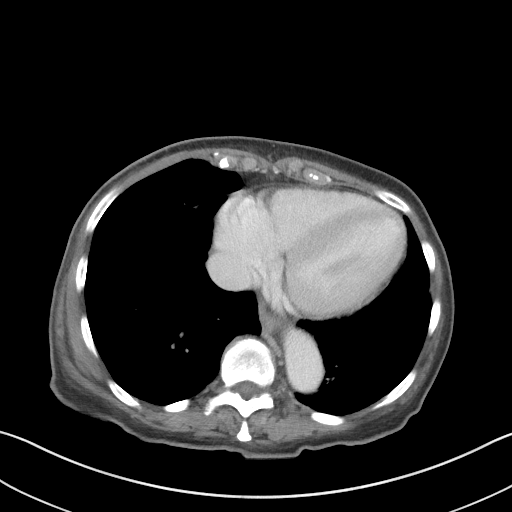

[Series 5: coronal st · coronal · 0.68mm/px · 3 of 77 slices shown]
[im 26/77  soft-tissue]
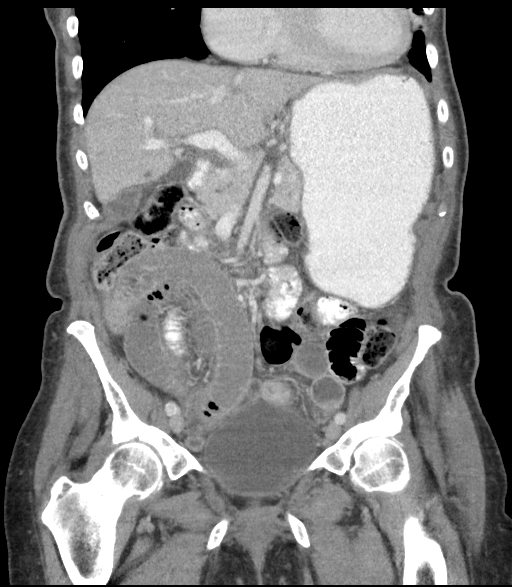
[im 34/77  soft-tissue]
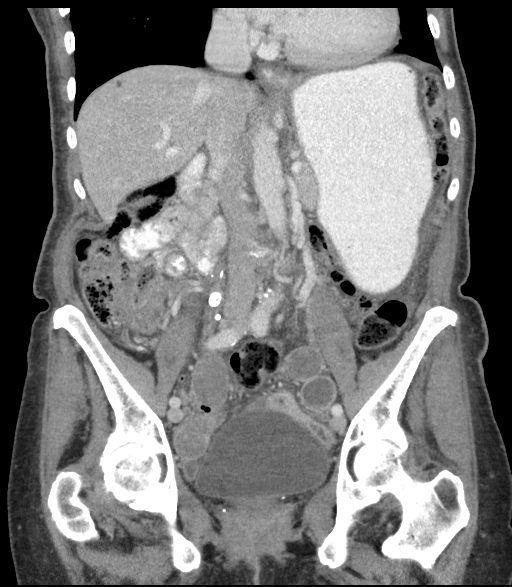
[im 43/77  soft-tissue]
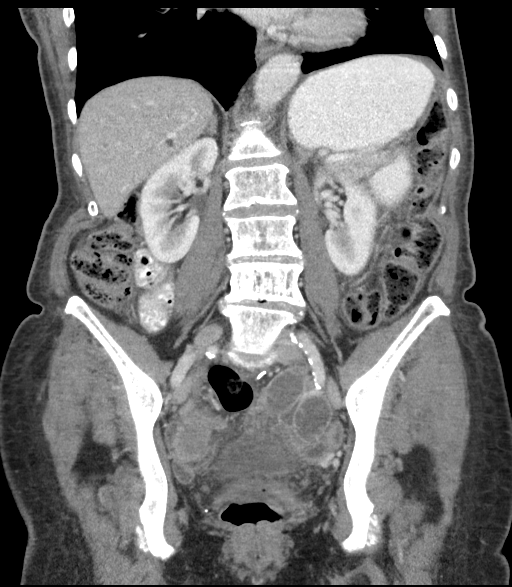

[16 of 46 positions shown; findings below may reference images not displayed]

FINDINGS: Lower chest: Volume loss in the posterior right middle lobe. Mild
cardiomegaly, without pericardial or pleural effusion.

Hepatobiliary: Scattered well-circumscribed hypoattenuating liver
lesions are likely cysts but are technically too small to
characterize. Normal gallbladder, without biliary ductal dilatation.

Pancreas: Suspicion of pancreas divisum, the prominent dorsal duct
entering the duodenum on image 26/series 2. No evidence of acute
pancreatitis.

Spleen: Normal in size, without focal abnormality.

Adrenals/Urinary Tract: Mild left adrenal thickening. Normal right
adrenal gland. Too small to characterize left renal lesion. Normal
right kidney, without hydronephrosis. Normal urinary bladder.

Stomach/Bowel: Normal stomach, without wall thickening. Surgical
sutures at the rectosigmoid junction. Colon is normal in caliber.
Normal appendix, including on image 49/series 2.

Mid small bowel loops are borderline dilated and fluid filled.
Example 2.5 cm on image 50/series 2. Small bowel enters a left
inguinal hernia, including on image 63/series 2. There is transition
between borderline dilated and relatively decompressed small bowel
(with distal small bowel identified on image 55/series 2). There is
no evidence of edema to suggest incarceration.

Vascular/Lymphatic: Aortic atherosclerosis. No abdominopelvic
adenopathy.

Reproductive: Hysterectomy.  No adnexal mass.

Other: No significant free fluid.  Mild pelvic floor laxity.

Musculoskeletal: Cortical thickening involving the L4 vertebral body
is suspicious for Paget's disease. Trace anterolisthesis of L3-4,
L4-5, and L5-S1.
IMPRESSION: 1. Small positioned within a left inguinal hernia. Caliber
transition at the level of the hernia is most consistent with
low-grade partial small bowel obstruction. No evidence of
strangulation or complicating ischemia.
2. Probable pancreas divisum without acute pancreatitis.
3.  Aortic atherosclerosis.

## 2018-11-23 ENCOUNTER — Other Ambulatory Visit: Payer: Self-pay

## 2018-11-23 ENCOUNTER — Ambulatory Visit (INDEPENDENT_AMBULATORY_CARE_PROVIDER_SITE_OTHER): Payer: Medicare HMO

## 2018-11-23 ENCOUNTER — Ambulatory Visit (INDEPENDENT_AMBULATORY_CARE_PROVIDER_SITE_OTHER): Payer: Medicare HMO | Admitting: Internal Medicine

## 2018-11-23 ENCOUNTER — Encounter: Payer: Self-pay | Admitting: Internal Medicine

## 2018-11-23 DIAGNOSIS — M25561 Pain in right knee: Secondary | ICD-10-CM | POA: Diagnosis not present

## 2018-11-23 DIAGNOSIS — R7989 Other specified abnormal findings of blood chemistry: Secondary | ICD-10-CM

## 2018-11-23 DIAGNOSIS — Z0000 Encounter for general adult medical examination without abnormal findings: Secondary | ICD-10-CM | POA: Diagnosis not present

## 2018-11-23 DIAGNOSIS — I5022 Chronic systolic (congestive) heart failure: Secondary | ICD-10-CM | POA: Diagnosis not present

## 2018-11-23 DIAGNOSIS — R945 Abnormal results of liver function studies: Secondary | ICD-10-CM | POA: Diagnosis not present

## 2018-11-23 DIAGNOSIS — Z85038 Personal history of other malignant neoplasm of large intestine: Secondary | ICD-10-CM | POA: Diagnosis not present

## 2018-11-23 DIAGNOSIS — I10 Essential (primary) hypertension: Secondary | ICD-10-CM | POA: Diagnosis not present

## 2018-11-23 DIAGNOSIS — E7800 Pure hypercholesterolemia, unspecified: Secondary | ICD-10-CM

## 2018-11-23 DIAGNOSIS — Z1231 Encounter for screening mammogram for malignant neoplasm of breast: Secondary | ICD-10-CM

## 2018-11-23 DIAGNOSIS — J4520 Mild intermittent asthma, uncomplicated: Secondary | ICD-10-CM | POA: Diagnosis not present

## 2018-11-23 DIAGNOSIS — R739 Hyperglycemia, unspecified: Secondary | ICD-10-CM | POA: Diagnosis not present

## 2018-11-23 DIAGNOSIS — I472 Ventricular tachycardia: Secondary | ICD-10-CM | POA: Diagnosis not present

## 2018-11-23 DIAGNOSIS — I110 Hypertensive heart disease with heart failure: Secondary | ICD-10-CM

## 2018-11-23 DIAGNOSIS — I4729 Other ventricular tachycardia: Secondary | ICD-10-CM

## 2018-11-23 NOTE — Progress Notes (Signed)
[name], female   DOB: 1928/09/04, 85 y.o.   MRN: [id number]   Virtual Visit via telephone Note  This visit type was conducted due to national recommendations for restrictions regarding the COVID-19 pandemic (e.g. social distancing).  This format is felt to be most appropriate for this patient at this time.  All issues noted in this document were discussed and addressed.  No physical exam was performed (except for noted visual exam findings with Video Visits).   I connected with Kathy Dominguez by telephone and verified that I am speaking with the correct person using two identifiers. Location patient: home Location provider: work  Persons participating in the telephone visit: patient, provider  I discussed the limitations, risks, security and privacy concerns of performing an evaluation and management service by telephone and the availability of in person appointments. The patient expressed understanding and agreed to proceed.   Reason for visit: scheduled follow up.   HPI: She reports she is doing relatively well.  Trying to stay active.  Does walk some.  No chest pain.  Feels her breathing is stable. Uses albuterol prn.  Does not use often.  Sees Dr Rockey Situ.  Last evaluated 11/2017 by cardiology.  Spironolactone increased to 25mg  q day.  States she is doing well with this.  Not checking blood pressure.  No acid reflux.  No abdominal pain.  Bowels moving.  Off crestor.  States was causing leg pain.  Feels better off.  Is watching her diet.  States she is keeping her weight <130 pounds.  Knee pain has resolved.     ROS: See pertinent positives and negatives per HPI.  Past Medical History:  Diagnosis Date  . Asthma   . Chronic systolic CHF (congestive heart failure) ([place])    a. 01/2015 Echo: EF 25-30%, sev diff HK, mild to mod MR, mildly dil LA, nl RV fxn, PASP 61 mmHg.  . Colon cancer ([place]) 2005  . Hypercholesterolemia   . Hypertension   . Hypertensive heart disease with  CHF (congestive heart failure) ([place])   . NICM (nonischemic cardiomyopathy) ([place])    a. 01/2015 Echo: EF 25-30% sev diff HK;  b. 01/2015 Lexi MV: EF 19%, small defect of mild severity in apex - likely breast attenuation, no ischemia.  Marland Kitchen NSVT (nonsustained ventricular tachycardia) ([place])   . Osteoporosis     Past Surgical History:  Procedure Laterality Date  . ABDOMINAL HYSTERECTOMY    . BREAST BIOPSY Left 4/15`   BENIGN BREAST EPITHELIUM WITH NODULAR FIBROSIS.  Marland Kitchen INGUINAL HERNIA REPAIR Left 05/04/2016   Procedure: HERNIA REPAIR INGUINAL ADULT;  Surgeon: Robert Bellow, MD;  Location: [facility] ORS;  Service: General;  Laterality: Left;  . THYROIDECTOMY, PARTIAL  1956  . TUBAL LIGATION      Family History  Problem Relation Age of Onset  . Stroke Mother   . Colon cancer Father   . Breast cancer Daughter 87  . Breast cancer Cousin     SOCIAL HX: reviewed.    Current Outpatient Medications:  .  acetaminophen (TYLENOL) 325 MG tablet, Take 650 mg by mouth every 6 (six) hours as needed., Disp: , Rfl:  .  albuterol (PROVENTIL HFA;VENTOLIN HFA) 108 (90 Base) MCG/ACT inhaler, Inhale 2 puffs into the lungs every 6 (six) hours as needed for wheezing or shortness of breath., Disp: 1 Inhaler, Rfl: 1 .  aspirin (ASPIRIN EC) 81 MG EC tablet, Take 81 mg by mouth daily. Swallow whole., Disp: , Rfl:  .  Cholecalciferol (VITAMIN D-3) 1000 UNITS CAPS, Take 2,000 Units by mouth daily., Disp: , Rfl:  .  ENTRESTO 97-103 MG, Take 1 tablet by mouth 2 (two) times daily., Disp: 60 tablet, Rfl: 8 .  furosemide (LASIX) 40 MG tablet, Take 1 tablet (40 mg total) by mouth daily., Disp: 30 tablet, Rfl: 3 .  isosorbide mononitrate (IMDUR) 30 MG 24 hr tablet, Take 1 tablet (30 mg total) by mouth daily., Disp: 90 tablet, Rfl: 3 .  metoprolol succinate (TOPROL XL) 25 MG 24 hr tablet, Take 1 tablet (25 mg total) by mouth daily., Disp: 90 tablet, Rfl: 3 .  rosuvastatin (CRESTOR) 10 MG tablet, Take 1 tablet (10 mg total) by  mouth daily. (Patient not taking: Reported on 11/23/2018), Disp: 90 tablet, Rfl: 3 .  spironolactone (ALDACTONE) 25 MG tablet, Take 1 tablet (25 mg total) by mouth daily., Disp: 90 tablet, Rfl: 3  EXAM:  GENERAL: alert.  Sounds to be in no acute distress.  Answering questions appropriately.   PSYCH/NEURO: pleasant and cooperative, no obvious depression or anxiety, speech and thought processing grossly intact  ASSESSMENT AND PLAN:  Discussed the following assessment and plan:  Essential hypertension States feels blood pressure has been doing well.  Not checking.  Follow pressures. Follow metabolic panel.    Abnormal liver function tests Follow liver function tests.    Asthma Has been followed by pulmonary.  Breathing stable.  Rarely uses albuterol.   Chronic systolic heart failure ([place]) Followed by cardiology.  Appears to be doing well on current regimen.  Schedule metabolic panel.  Sees cardiology.  Last evaluated 11/2017.  Recommended yearly follow up.   History of colon cancer Colonoscopy 2014.  CEA 2.2 (08/2017).    Hypercholesterolemia Off crestor.  Feels better off.  Was causing leg pain.  Follow lipid panel.  Has adjusted her diet.    Hypertensive heart disease with CHF (congestive heart failure) ([place]) Followed by cardiology.  Stable on current regimen.    Right knee pain Resolved.   NSVT (nonsustained ventricular tachycardia) ([place]) Documented per cardiology.  Rate has been controlled. On metoprolol.  Followed by cardiology.    Hyperglycemia Has adjusted her diet.  Check a1c.   Breast cancer screening Order placed for mammogram.  She will call to schedule.      I discussed the assessment and treatment plan with the patient. The patient was provided an opportunity to ask questions and all were answered. The patient agreed with the plan and demonstrated an understanding of the instructions.   The patient was advised to call back or seek an in-person evaluation if  the symptoms worsen or if the condition fails to improve as anticipated.  I provided 15 minutes of non-face-to-face time during this encounter.   Einar Pheasant, MD

## 2018-11-23 NOTE — Patient Instructions (Addendum)
  Kathy Dominguez , Thank you for taking time to come for your Medicare Wellness Visit. I appreciate your ongoing commitment to your health goals. Please review the following plan we discussed and let me know if I can assist you in the future.   These are the goals we discussed: Goals    . Follow up with Primary Care Provider     As needed       This is a list of the screening recommended for you and due dates:  Health Maintenance  Topic Date Due  . Mammogram  05/05/2018  . Flu Shot  04/25/2019*  . DEXA scan (bone density measurement)  11/23/2019*  . Tetanus Vaccine  06/12/2024*  . Pneumonia vaccines  Completed  *Topic was postponed. The date shown is not the original due date.

## 2018-11-23 NOTE — Progress Notes (Addendum)
Subjective:   Kathy Dominguez is a 85 y.o. female who presents for Medicare Annual (Subsequent) preventive examination.  Review of Systems:  No ROS.  Medicare Wellness Virtual Visit.  Visual/audio telehealth visit, UTA vital signs.   See social history for additional risk factors.   Cardiac Risk Factors include: advanced age (>71men, >1 women);hypertension     Objective:     Vitals: LMP 02/21/1966   There is no height or weight on file to calculate BMI.  Advanced Directives 11/23/2018 08/30/2017 05/17/2016 05/03/2016 05/02/2016 04/14/2016 03/28/2016  Does Patient Have a Medical Advance Directive? No No No No No No No  Would patient like information on creating a medical advance directive? No - Patient declined Yes (MAU/Ambulatory/Procedural Areas - Information given) - No - Patient declined No - Patient declined - No - Patient declined    Tobacco Social History   Tobacco Use  Smoking Status Never Smoker  Smokeless Tobacco Never Used     Counseling given: Not Answered   Clinical Intake:  Pre-visit preparation completed: Yes        Diabetes: No  How often do you need to have someone help you when you read instructions, pamphlets, or other written materials from your doctor or pharmacy?: 1 - Never  Interpreter Needed?: No     Past Medical History:  Diagnosis Date  . Asthma   . Chronic systolic CHF (congestive heart failure) ([place])    a. 01/2015 Echo: EF 25-30%, sev diff HK, mild to mod MR, mildly dil LA, nl RV fxn, PASP 61 mmHg.  . Colon cancer ([place]) 2005  . Hypercholesterolemia   . Hypertension   . Hypertensive heart disease with CHF (congestive heart failure) ([place])   . NICM (nonischemic cardiomyopathy) ([place])    a. 01/2015 Echo: EF 25-30% sev diff HK;  b. 01/2015 Lexi MV: EF 19%, small defect of mild severity in apex - likely breast attenuation, no ischemia.  Marland Kitchen NSVT (nonsustained ventricular tachycardia) ([place])   . Osteoporosis    Past Surgical History:  Procedure  Laterality Date  . ABDOMINAL HYSTERECTOMY    . BREAST BIOPSY Left 4/15`   BENIGN BREAST EPITHELIUM WITH NODULAR FIBROSIS.  Marland Kitchen INGUINAL HERNIA REPAIR Left 05/04/2016   Procedure: HERNIA REPAIR INGUINAL ADULT;  Surgeon: Robert Bellow, MD;  Location: [facility] ORS;  Service: General;  Laterality: Left;  . THYROIDECTOMY, PARTIAL  1956  . TUBAL LIGATION     Family History  Problem Relation Age of Onset  . Stroke Mother   . Colon cancer Father   . Breast cancer Daughter 53  . Breast cancer Cousin    Social History   Socioeconomic History  . Marital status: Widowed    Spouse name: Not on file  . Number of children: Not on file  . Years of education: Not on file  . Highest education level: Not on file  Occupational History  . Not on file  Social Needs  . Financial resource strain: Not hard at all  . Food insecurity    Worry: Never true    Inability: Never true  . Transportation needs    Medical: No    Non-medical: No  Tobacco Use  . Smoking status: Never Smoker  . Smokeless tobacco: Never Used  Substance and Sexual Activity  . Alcohol use: No    Alcohol/week: 0.0 standard drinks  . Drug use: No  . Sexual activity: Not on file  Lifestyle  . Physical activity    Days per week: 0  days    Minutes per session: Not on file  . Stress: Not at all  Relationships  . Social Herbalist on phone: Not on file    Gets together: Not on file    Attends religious service: Not on file    Active member of club or organization: Not on file    Attends meetings of clubs or organizations: Not on file    Relationship status: Not on file  Other Topics Concern  . Not on file  Social History Narrative  . Not on file    Outpatient Encounter Medications as of 11/23/2018  Medication Sig  . acetaminophen (TYLENOL) 325 MG tablet Take 650 mg by mouth every 6 (six) hours as needed.  Marland Kitchen albuterol (PROVENTIL HFA;VENTOLIN HFA) 108 (90 Base) MCG/ACT inhaler Inhale 2 puffs into the lungs every  6 (six) hours as needed for wheezing or shortness of breath.  Marland Kitchen aspirin (ASPIRIN EC) 81 MG EC tablet Take 81 mg by mouth daily. Swallow whole.  . Cholecalciferol (VITAMIN D-3) 1000 UNITS CAPS Take 2,000 Units by mouth daily.  Marland Kitchen ENTRESTO 97-103 MG Take 1 tablet by mouth 2 (two) times daily.  . furosemide (LASIX) 40 MG tablet Take 1 tablet (40 mg total) by mouth daily.  . isosorbide mononitrate (IMDUR) 30 MG 24 hr tablet Take 1 tablet (30 mg total) by mouth daily.  . metoprolol succinate (TOPROL XL) 25 MG 24 hr tablet Take 1 tablet (25 mg total) by mouth daily.  Marland Kitchen spironolactone (ALDACTONE) 25 MG tablet Take 1 tablet (25 mg total) by mouth daily.  . rosuvastatin (CRESTOR) 10 MG tablet Take 1 tablet (10 mg total) by mouth daily. (Patient not taking: Reported on 11/23/2018)   No facility-administered encounter medications on file as of 11/23/2018.     Activities of Daily Living In your present state of health, do you have any difficulty performing the following activities: 11/23/2018  Hearing? N  Vision? N  Difficulty concentrating or making decisions? N  Walking or climbing stairs? N  Dressing or bathing? N  Doing errands, shopping? N  Preparing Food and eating ? N  Using the Toilet? N  In the past six months, have you accidently leaked urine? N  Do you have problems with loss of bowel control? N  Managing your Medications? N  Managing your Finances? N  Some recent data might be hidden    Patient Care Team: Einar Pheasant, MD as PCP - General (Internal Medicine) Byrnett, Forest Gleason, MD (General Surgery) Einar Pheasant, MD (Internal Medicine) Alisa Graff, FNP as Nurse Practitioner (Family Medicine) Erby Pian, MD as Referring Physician (Pulmonary Disease) Minna Merritts, MD as Consulting Physician (Cardiology)    Assessment:   This is a routine wellness examination for [facility].  Nurse connected with patient 11/23/18 at  9:00 AM EDT by a telephone enabled telemedicine  application and verified that I am speaking with the correct person using two identifiers. Patient stated full name and DOB. Patient gave permission to continue with virtual visit. Patient's location was at home and Nurse's location was at [place] office.   Health Maintenance Due: -Influenza vaccine 2020- discussed; to be completed in season with doctor or local pharmacy.   -Mammogram- plans to schedule later in the year.  -Dexa Scan- declined.  Update all pending maintenance due as appropriate.   See completed HM at the end of note.   Eye: Visual acuity not assessed. Virtual visit. Wears corrective lenses. Followed by  their ophthalmologist.  Dental: UTD  Hearing: Demonstrates normal hearing during visit.  Safety:  Patient feels safe at home- yes Patient does have smoke detectors at home- yes Patient does wear sunscreen or protective clothing when in direct sunlight - yes Patient does wear seat belt when in a moving vehicle - yes Patient drives- yes Adequate lighting in walkways free from debris- yes Grab bars and handrails used as appropriate- yes Ambulates with no assistive device Cell phone or lifeline/life alert/medic alert on person when ambulating outside of the home- yes  Social: Alcohol intake - no   Smoking history- never   Smokers in home? none Illicit drug use? none  Depression: PHQ 2 &9 complete. See screening below. Denies irritability, anhedonia, sadness/tearfullness.    Falls: See screening below.    Medication: Taking as directed and without issues.   Covid-19: Precautions and sickness symptoms discussed. Wears mask, social distancing, hand hygiene as appropriate.   Activities of Daily Living Patient denies needing assistance with: household chores, feeding themselves, getting from bed to chair, getting to the toilet, bathing/showering, dressing, managing money, or preparing meals.   Memory: Patient is alert. Patient denies difficulty focusing or  concentrating. Correctly identified the president of the [place], season and recall.  Patient likes to play sodoku and reads for brain stimulation.   BMI- discussed the importance of a healthy diet, water intake and the benefits of aerobic exercise.  Educational material provided.  Physical activity- walking 10 minutes indoors, post physical therapy knee exercises.  Diet:  Regular; healthy Water: good intake  Other Providers Patient Care Team: Einar Pheasant, MD as PCP - General (Internal Medicine) Bary Castilla, Forest Gleason, MD (General Surgery) Einar Pheasant, MD (Internal Medicine) Alisa Graff, FNP as Nurse Practitioner (Family Medicine) Erby Pian, MD as Referring Physician (Pulmonary Disease) Minna Merritts, MD as Consulting Physician (Cardiology)  Exercise Activities and Dietary recommendations Current Exercise Habits: Home exercise routine, Type of exercise: walking, Time (Minutes): 10, Frequency (Times/Week): 5, Weekly Exercise (Minutes/Week): 50, Intensity: Mild  Goals    . Follow up with Primary Care Provider     As needed       Fall Risk Fall Risk  11/23/2018 08/30/2017 08/23/2017 07/21/2017 05/17/2016  Falls in the past year? 0 No No No No  Number falls in past yr: 0 - - - -   Timed Get Up and Go performed: no, virtual visit  Depression Screen PHQ 2/9 Scores 11/23/2018 08/30/2017 08/23/2017 05/17/2016  PHQ - 2 Score 0 0 0 0     Cognitive Function     6CIT Screen 11/23/2018 08/30/2017  What Year? 0 points 0 points  What month? 0 points 0 points  What time? - 0 points  Count back from 20 0 points 0 points  Months in reverse 0 points 0 points  Repeat phrase 4 points 0 points  Total Score - 0    Immunization History  Administered Date(s) Administered  . Influenza, High Dose Seasonal PF 12/04/2015, 11/23/2016, 11/21/2017  . Influenza,inj,Quad PF,6+ Mos 02/20/2015  . Pneumococcal Conjugate-13 12/19/2017  . Pneumococcal-Unspecified 01/26/2003  . Zoster  Recombinat (Shingrix) 03/10/2018, 06/23/2018   Screening Tests Health Maintenance  Topic Date Due  . MAMMOGRAM  05/05/2018  . INFLUENZA VACCINE  04/25/2019 (Originally 08/26/2018)  . DEXA SCAN  11/23/2019 (Originally 03/12/1995)  . TETANUS/TDAP  06/12/2024 (Originally 03/11/1947)  . PNA vac Low Risk Adult  Completed      Plan:   Keep all routine maintenance appointments.  Follow up with your doctor today @ [place] I have personally reviewed: The patient's medical and social history Their use of alcohol, tobacco or illicit drugs Their current medications and supplements The patient's functional ability including ADLs,fall risks, home safety risks, cognitive, and hearing and visual impairment Diet and physical activities Evidence for depression    In addition, I have reviewed and discussed with patient certain preventive protocols, quality metrics, and best practice recommendations. A written personalized care plan for preventive services as well as general preventive health recommendations were provided to patient via mail.     Varney Biles, LPN  [phone]   Reviewed above information.  Agree with assessment and plan.   Dr Nicki Reaper

## 2018-11-24 ENCOUNTER — Ambulatory Visit
Admission: RE | Admit: 2018-11-24 | Discharge: 2018-11-24 | Disposition: A | Discharge status: Home / Self Care | Payer: Medicare HMO | Source: Ambulatory Visit | Attending: Internal Medicine | Admitting: Internal Medicine

## 2018-11-24 ENCOUNTER — Encounter: Payer: Self-pay | Admitting: Internal Medicine

## 2018-11-24 DIAGNOSIS — Z1231 Encounter for screening mammogram for malignant neoplasm of breast: Secondary | ICD-10-CM | POA: Insufficient documentation

## 2018-11-24 DIAGNOSIS — Z1239 Encounter for other screening for malignant neoplasm of breast: Secondary | ICD-10-CM

## 2018-11-24 DIAGNOSIS — R739 Hyperglycemia, unspecified: Secondary | ICD-10-CM | POA: Insufficient documentation

## 2018-11-24 NOTE — Assessment & Plan Note (Signed)
Documented per cardiology.  Rate has been controlled. On metoprolol.  Followed by cardiology.

## 2018-11-24 NOTE — Assessment & Plan Note (Signed)
Has been followed by pulmonary.  Breathing stable.  Rarely uses albuterol.

## 2018-11-24 NOTE — Assessment & Plan Note (Signed)
Resolved

## 2018-11-24 NOTE — Assessment & Plan Note (Signed)
Off crestor.  Feels better off.  Was causing leg pain.  Follow lipid panel.  Has adjusted her diet.

## 2018-11-24 NOTE — Assessment & Plan Note (Signed)
Colonoscopy 2014.  CEA 2.2 (08/2017).

## 2018-11-24 NOTE — Assessment & Plan Note (Signed)
Followed by cardiology.  Stable on current regimen.

## 2018-11-24 NOTE — Assessment & Plan Note (Signed)
Followed by cardiology.  Appears to be doing well on current regimen.  Schedule metabolic panel.  Sees cardiology.  Last evaluated 11/2017.  Recommended yearly follow up.

## 2018-11-24 NOTE — Assessment & Plan Note (Signed)
States feels blood pressure has been doing well.  Not checking.  Follow pressures. Follow metabolic panel.

## 2018-11-24 NOTE — Assessment & Plan Note (Signed)
Order placed for mammogram.  She will call to schedule.

## 2018-11-24 NOTE — Assessment & Plan Note (Signed)
Has adjusted her diet.  Check a1c.

## 2018-11-24 NOTE — Assessment & Plan Note (Signed)
Follow liver function tests.   

## 2018-11-30 DIAGNOSIS — H26493 Other secondary cataract, bilateral: Secondary | ICD-10-CM | POA: Diagnosis not present

## 2018-12-02 DIAGNOSIS — Z0100 Encounter for examination of eyes and vision without abnormal findings: Secondary | ICD-10-CM | POA: Diagnosis not present

## 2018-12-04 ENCOUNTER — Other Ambulatory Visit: Payer: Self-pay

## 2018-12-06 ENCOUNTER — Other Ambulatory Visit: Payer: Self-pay

## 2018-12-06 ENCOUNTER — Other Ambulatory Visit (INDEPENDENT_AMBULATORY_CARE_PROVIDER_SITE_OTHER): Payer: Medicare HMO

## 2018-12-06 DIAGNOSIS — R945 Abnormal results of liver function studies: Secondary | ICD-10-CM

## 2018-12-06 DIAGNOSIS — R739 Hyperglycemia, unspecified: Secondary | ICD-10-CM | POA: Diagnosis not present

## 2018-12-06 DIAGNOSIS — E7800 Pure hypercholesterolemia, unspecified: Secondary | ICD-10-CM

## 2018-12-06 DIAGNOSIS — Z23 Encounter for immunization: Secondary | ICD-10-CM | POA: Diagnosis not present

## 2018-12-06 DIAGNOSIS — I10 Essential (primary) hypertension: Secondary | ICD-10-CM | POA: Diagnosis not present

## 2018-12-06 DIAGNOSIS — R7989 Other specified abnormal findings of blood chemistry: Secondary | ICD-10-CM

## 2018-12-06 LAB — BASIC METABOLIC PANEL
BUN: 34 mg/dL — ABNORMAL HIGH (ref 6–23)
CO2: 32 mEq/L (ref 19–32)
Calcium: 9.4 mg/dL (ref 8.4–10.5)
Chloride: 100 mEq/L (ref 96–112)
Creatinine, Ser: 1.48 mg/dL — ABNORMAL HIGH (ref 0.40–1.20)
GFR: 40.2 mL/min — ABNORMAL LOW (ref >60.00)
Glucose, Bld: 105 mg/dL — ABNORMAL HIGH (ref 70–99)
Potassium: 4.4 mEq/L (ref 3.5–5.1)
Sodium: 141 mEq/L (ref 135–145)

## 2018-12-06 LAB — CBC WITH DIFFERENTIAL/PLATELET
Basophils Absolute: 0 10*3/uL (ref 0.0–0.1)
Basophils Relative: 0.5 % (ref 0.0–3.0)
Eosinophils Absolute: 0 10*3/uL (ref 0.0–0.7)
Eosinophils Relative: 0.3 % (ref 0.0–5.0)
HCT: 35.7 % — ABNORMAL LOW (ref 36.0–46.0)
Hemoglobin: 12 g/dL (ref 12.0–15.0)
Lymphocytes Relative: 42.1 % (ref 12.0–46.0)
Lymphs Abs: 1.7 10*3/uL (ref 0.7–4.0)
MCHC: 33.7 g/dL (ref 30.0–36.0)
MCV: 90.6 fl (ref 78.0–100.0)
Monocytes Absolute: 0.4 10*3/uL (ref 0.1–1.0)
Monocytes Relative: 10.4 % (ref 3.0–12.0)
Neutro Abs: 1.9 10*3/uL (ref 1.4–7.7)
Neutrophils Relative %: 46.7 % (ref 43.0–77.0)
Platelets: 196 10*3/uL (ref 150.0–400.0)
RBC: 3.94 Mil/uL (ref 3.87–5.11)
RDW: 13.6 % (ref 11.5–15.5)
WBC: 4.1 10*3/uL (ref 4.0–10.5)

## 2018-12-06 LAB — TSH: TSH: 0.64 u[IU]/mL (ref 0.35–4.50)

## 2018-12-06 LAB — HEPATIC FUNCTION PANEL
ALT: 9 U/L (ref 0–35)
AST: 16 U/L (ref 0–37)
Albumin: 4.4 g/dL (ref 3.5–5.2)
Alkaline Phosphatase: 61 U/L (ref 39–117)
Bilirubin, Direct: 0.1 mg/dL (ref 0.0–0.3)
Total Bilirubin: 0.5 mg/dL (ref 0.2–1.2)
Total Protein: 7 g/dL (ref 6.0–8.3)

## 2018-12-06 LAB — LIPID PANEL
Cholesterol: 212 mg/dL — ABNORMAL HIGH (ref 0–200)
HDL: 72.5 mg/dL (ref >39.00)
LDL Cholesterol: 129 mg/dL — ABNORMAL HIGH (ref 0–99)
NonHDL: 139.65
Total CHOL/HDL Ratio: 3
Triglycerides: 53 mg/dL (ref 0.0–149.0)
VLDL: 10.6 mg/dL (ref 0.0–40.0)

## 2018-12-06 LAB — HEMOGLOBIN A1C: Hgb A1c MFr Bld: 6.1 % (ref 4.6–6.5)

## 2018-12-12 ENCOUNTER — Telehealth: Payer: Self-pay | Admitting: *Deleted

## 2018-12-12 DIAGNOSIS — R944 Abnormal results of kidney function studies: Secondary | ICD-10-CM

## 2018-12-12 NOTE — Telephone Encounter (Signed)
Order placed for f/u met b

## 2018-12-12 NOTE — Telephone Encounter (Signed)
Please place future orders for lab appt.  

## 2018-12-15 ENCOUNTER — Other Ambulatory Visit
Admission: RE | Admit: 2018-12-15 | Discharge: 2018-12-15 | Disposition: A | Discharge status: Home / Self Care | Payer: Medicare HMO | Source: Ambulatory Visit | Attending: Internal Medicine | Admitting: Internal Medicine

## 2018-12-15 ENCOUNTER — Other Ambulatory Visit: Payer: Self-pay | Admitting: Internal Medicine

## 2018-12-15 ENCOUNTER — Other Ambulatory Visit (INDEPENDENT_AMBULATORY_CARE_PROVIDER_SITE_OTHER): Payer: Medicare HMO

## 2018-12-15 ENCOUNTER — Other Ambulatory Visit: Payer: Self-pay

## 2018-12-15 DIAGNOSIS — R944 Abnormal results of kidney function studies: Secondary | ICD-10-CM

## 2018-12-15 DIAGNOSIS — E875 Hyperkalemia: Secondary | ICD-10-CM

## 2018-12-15 LAB — BASIC METABOLIC PANEL
BUN: 26 mg/dL — ABNORMAL HIGH (ref 6–23)
CO2: 32 mEq/L (ref 19–32)
Calcium: 9.7 mg/dL (ref 8.4–10.5)
Chloride: 101 mEq/L (ref 96–112)
Creatinine, Ser: 1.28 mg/dL — ABNORMAL HIGH (ref 0.40–1.20)
GFR: 47.54 mL/min — ABNORMAL LOW (ref >60.00)
Glucose, Bld: 91 mg/dL (ref 70–99)
Potassium: 5.2 mEq/L — ABNORMAL HIGH (ref 3.5–5.1)
Sodium: 141 mEq/L (ref 135–145)

## 2018-12-15 LAB — POTASSIUM: Potassium: 4.5 mmol/L (ref 3.5–5.1)

## 2018-12-15 NOTE — Progress Notes (Signed)
Order placed for f/u stat potassium.  

## 2019-01-04 DIAGNOSIS — Z803 Family history of malignant neoplasm of breast: Secondary | ICD-10-CM | POA: Diagnosis not present

## 2019-01-04 DIAGNOSIS — I2510 Atherosclerotic heart disease of native coronary artery without angina pectoris: Secondary | ICD-10-CM | POA: Diagnosis not present

## 2019-01-04 DIAGNOSIS — K08409 Partial loss of teeth, unspecified cause, unspecified class: Secondary | ICD-10-CM | POA: Diagnosis not present

## 2019-01-04 DIAGNOSIS — M810 Age-related osteoporosis without current pathological fracture: Secondary | ICD-10-CM | POA: Diagnosis not present

## 2019-01-04 DIAGNOSIS — J449 Chronic obstructive pulmonary disease, unspecified: Secondary | ICD-10-CM | POA: Diagnosis not present

## 2019-01-04 DIAGNOSIS — I509 Heart failure, unspecified: Secondary | ICD-10-CM | POA: Diagnosis not present

## 2019-01-04 DIAGNOSIS — E261 Secondary hyperaldosteronism: Secondary | ICD-10-CM | POA: Diagnosis not present

## 2019-01-04 DIAGNOSIS — M1990 Unspecified osteoarthritis, unspecified site: Secondary | ICD-10-CM | POA: Diagnosis not present

## 2019-01-04 DIAGNOSIS — Z7982 Long term (current) use of aspirin: Secondary | ICD-10-CM | POA: Diagnosis not present

## 2019-01-04 DIAGNOSIS — I110 Hypertensive heart disease with heart failure: Secondary | ICD-10-CM | POA: Diagnosis not present

## 2019-01-13 ENCOUNTER — Other Ambulatory Visit: Payer: Self-pay | Admitting: Cardiovascular Disease

## 2019-01-15 NOTE — Telephone Encounter (Signed)
Pt overdue for 1 yr f/u 11/2018 lm for pt to contact office and schedule future appointment.

## 2019-02-02 ENCOUNTER — Other Ambulatory Visit: Payer: Self-pay | Admitting: Cardiovascular Disease

## 2019-02-15 ENCOUNTER — Other Ambulatory Visit: Payer: Self-pay | Admitting: Cardiovascular Disease

## 2019-03-08 ENCOUNTER — Other Ambulatory Visit: Payer: Self-pay

## 2019-03-08 ENCOUNTER — Other Ambulatory Visit: Payer: Self-pay | Admitting: Cardiovascular Disease

## 2019-03-08 MED ORDER — ENTRESTO 97-103 MG PO TABS: ORAL_TABLET | ORAL | 0 refills | Status: DC

## 2019-03-16 ENCOUNTER — Other Ambulatory Visit: Payer: Self-pay | Admitting: Cardiovascular Disease

## 2019-03-27 ENCOUNTER — Ambulatory Visit: Payer: Medicare HMO | Admitting: Cardiovascular Disease

## 2019-03-28 ENCOUNTER — Encounter: Payer: Self-pay | Admitting: Cardiovascular Disease

## 2019-03-28 ENCOUNTER — Other Ambulatory Visit: Payer: Self-pay

## 2019-03-28 ENCOUNTER — Ambulatory Visit (INDEPENDENT_AMBULATORY_CARE_PROVIDER_SITE_OTHER): Payer: Medicare HMO | Admitting: Cardiovascular Disease

## 2019-03-28 VITALS — BP 102/70 | HR 83 | Ht 63.0 in | Wt 129.0 lb

## 2019-03-28 DIAGNOSIS — I5022 Chronic systolic (congestive) heart failure: Secondary | ICD-10-CM

## 2019-03-28 DIAGNOSIS — I428 Other cardiomyopathies: Secondary | ICD-10-CM

## 2019-03-28 DIAGNOSIS — I10 Essential (primary) hypertension: Secondary | ICD-10-CM | POA: Diagnosis not present

## 2019-03-28 DIAGNOSIS — I471 Supraventricular tachycardia: Secondary | ICD-10-CM | POA: Diagnosis not present

## 2019-03-28 MED ORDER — SPIRONOLACTONE 25 MG PO TABS: 25.0000 mg | ORAL_TABLET | Freq: Every day | ORAL | 3 refills | Status: DC

## 2019-03-28 MED ORDER — ROSUVASTATIN CALCIUM 5 MG PO TABS: 5.0000 mg | ORAL_TABLET | Freq: Every day | ORAL | 3 refills | Status: DC

## 2019-03-28 MED ORDER — ENTRESTO 97-103 MG PO TABS: ORAL_TABLET | ORAL | 11 refills | Status: DC

## 2019-03-28 MED ORDER — ISOSORBIDE MONONITRATE ER 30 MG PO TB24: 30.0000 mg | ORAL_TABLET | Freq: Every day | ORAL | 0 refills | Status: DC

## 2019-03-28 MED ORDER — METOPROLOL SUCCINATE ER 25 MG PO TB24: 25.0000 mg | ORAL_TABLET | Freq: Every day | ORAL | 3 refills | Status: DC

## 2019-03-28 NOTE — Patient Instructions (Addendum)
Medication Instructions:   Retry Rosuvastatin 1/2 pill daily or every other day 5 mg   Take your weight every other day  If weight <130 take lasix 1/2 tablet 20 mg (every other day)  If weight >130 take lasix 40 mg (every other day)  If you need a refill on your cardiac medications before your next appointment, please call your pharmacy.    Lab work: No new labs needed   If you have labs (blood work) drawn today and your tests are completely normal, you will receive your results only by: Marland Kitchen MyChart Message (if you have MyChart) OR . A paper copy in the mail If you have any lab test that is abnormal or we need to change your treatment, we will call you to review the results.   Testing/Procedures: No new testing needed   Follow-Up: At [facility], you and your health needs are our priority.  As part of our continuing mission to provide you with exceptional heart care, we have created designated Provider Care Teams.  These Care Teams include your primary Cardiologist (physician) and Advanced Practice Providers (APPs -  Physician Assistants and Nurse Practitioners) who all work together to provide you with the care you need, when you need it.  . You will need a follow up appointment in 12 months   . Providers on your designated Care Team:   . Murray Hodgkins, NP . Christell Faith, PA-C . Marrianne Mood, PA-C  Any Other Special Instructions Will Be Listed Below (If Applicable).  For educational health videos Log in to : [email or web address] Or : [email or web address], password : [email or web address]  [facility] number  - To schedule appointments: patients should call [phone]. Wait for the line to finish ringing and then leave a message clearly stating your full name, phone number, and reason for visit.

## 2019-03-28 NOTE — Progress Notes (Signed)
Cardiology Office Note  Date:  03/28/2019   ID:  Kathy Dominguez, DOB May 14, 1928, MRN [id number]  PCP:  Einar Pheasant, MD   Chief Complaint  Patient presents with  . office visit    Pt having diffculty breathing. Meds verbally reviewed w/ pt.    HPI:  86 y/o ? with a h/o  HTN,   [facility] 01/28/2016 for shortness of breath,  diagnosed with acute systolic CHF  ejection fraction 25-35%, nonischemic 01/2016 myoview showing no ischemia,  echo EF 50 to 55% 09/2016 who presents for follow-up  of her chronic systolic CHF   Takes lasix QOD "goes to the bathroom all the time" Urge , at times Occasionally with accidents  Weight at home 127 pounds  CT scan 2108: mild to moderate PAD, aortic athero, particularly in iliac vessels  Labs reviewed HBA1C 6.1 Total chol 212, LDL 129  Continues  Lasix 40 daily Denies any lower extremity edema, no shortness of breath   Lab work reviewed showing  hemoglobin A1c 6.1 Creatinine 0.98 BUN 20 Total cholesterol 151 LDL 64  EKG personally reviewed by myself on todays visit Shows normal sinus rhythm with rate 83 bpm no significant ST or T wave changes   PMH:   has a past medical history of Asthma, Chronic systolic CHF (congestive heart failure) ([place]), Colon cancer ([place]) (2005), Hypercholesterolemia, Hypertension, Hypertensive heart disease with CHF (congestive heart failure) ([place]), NICM (nonischemic cardiomyopathy) ([place]), NSVT (nonsustained ventricular tachycardia) ([place]), and Osteoporosis.  PSH:    Past Surgical History:  Procedure Laterality Date  . ABDOMINAL HYSTERECTOMY    . BREAST BIOPSY Left 4/15`   BENIGN BREAST EPITHELIUM WITH NODULAR FIBROSIS.  Marland Kitchen INGUINAL HERNIA REPAIR Left 05/04/2016   Procedure: HERNIA REPAIR INGUINAL ADULT;  Surgeon: Robert Bellow, MD;  Location: [facility] ORS;  Service: General;  Laterality: Left;  . THYROIDECTOMY, PARTIAL  1956  . TUBAL LIGATION      Current Outpatient Medications  Medication Sig Dispense Refill   . acetaminophen (TYLENOL) 325 MG tablet Take 650 mg by mouth every 6 (six) hours as needed.    Marland Kitchen albuterol (PROVENTIL HFA;VENTOLIN HFA) 108 (90 Base) MCG/ACT inhaler Inhale 2 puffs into the lungs every 6 (six) hours as needed for wheezing or shortness of breath. 1 Inhaler 1  . aspirin (ASPIRIN EC) 81 MG EC tablet Take 81 mg by mouth daily. Swallow whole.    . Cholecalciferol (VITAMIN D-3) 1000 UNITS CAPS Take 2,000 Units by mouth daily.    . furosemide (LASIX) 40 MG tablet Take 1 tablet (40 mg total) by mouth daily. 30 tablet 0  . isosorbide mononitrate (IMDUR) 30 MG 24 hr tablet Take 1 tablet (30 mg total) by mouth daily. 30 tablet 0  . metoprolol succinate (TOPROL-XL) 25 MG 24 hr tablet Take 1 tablet (25 mg total) by mouth daily. 30 tablet 0  . sacubitril-valsartan (ENTRESTO) 97-103 MG Take 1 tablet by mouth 2 (two) times daily. 60 tablet 0  . spironolactone (ALDACTONE) 25 MG tablet Take 1 tablet (25 mg total) by mouth daily. 30 tablet 0   No current facility-administered medications for this visit.     Allergies:   Patient has no known allergies.   Social History:  The patient  reports that she has never smoked. She has never used smokeless tobacco. She reports that she does not drink alcohol or use drugs.   Family History:   family history includes Breast cancer in her cousin; Breast cancer (age of onset: 72) in her  daughter; Colon cancer in her father; Stroke in her mother.    Review of Systems: Review of Systems  Constitutional: Negative.   Respiratory: Negative.   Cardiovascular: Negative.   Gastrointestinal: Negative.   Musculoskeletal: Positive for joint pain.  Neurological: Negative.   Psychiatric/Behavioral: Negative.   All other systems reviewed and are negative.   PHYSICAL EXAM: VS:  BP 102/70 (BP Location: Left Arm, Patient Position: Sitting, Cuff Size: Normal)   Pulse 83   Ht 5\' 3"  (1.6 m)   Wt 129 lb (58.5 kg)   LMP 02/21/1966   SpO2 96%   BMI 22.85 kg/m  ,  BMI Body mass index is 22.85 kg/m. Constitutional:  oriented to person, place, and time. No distress.  HENT:  Head: Grossly normal Eyes:  no discharge. No scleral icterus.  Neck: No JVD, no carotid bruits  Cardiovascular: Regular rate and rhythm, no murmurs appreciated Pulmonary/Chest: Clear to auscultation bilaterally, no wheezes or rails Abdominal: Soft.  no distension.  no tenderness.  Musculoskeletal: Normal range of motion Neurological:  normal muscle tone. Coordination normal. No atrophy Skin: Skin warm and dry Psychiatric: normal affect, pleasant   Recent Labs: 12/06/2018: ALT 9; Hemoglobin 12.0; Platelets 196.0; TSH 0.64 12/15/2018: BUN 26; Creatinine, Ser 1.28; Potassium 4.5; Sodium 141   Lipid Panel Lab Results  Component Value Date   CHOL 212 (H) 12/06/2018   HDL 72.50 12/06/2018   LDLCALC 129 (H) 12/06/2018   TRIG 53.0 12/06/2018      Wt Readings from Last 3 Encounters:  03/28/19 129 lb (58.5 kg)  11/24/18 130 lb (59 kg)  03/23/18 131 lb 3.2 oz (59.5 kg)      ASSESSMENT AND PLAN:  Chronic systolic heart failure (HCC) No recent trips to the hospital Appears euvolemic on today's visit Long discussion concerning her Lasix Polyuria, urgency at times rare accidents Recent renal function reviewed, mildly dehydrated November 2020 Recommended Lasix 20 every other day for weight less than 130 Suggested Lasix 40 every other day for weight greater than 130  Essential hypertension Blood pressure is well controlled on today's visit. No changes made to the medications.  We will start assistance packet for [facility] company assistance application  Hypercholesterolemia Reports having some leg cramping on Crestor 10 Recommend she try Crestor 5 even every other day as tolerated 60 point jump in total cholesterol  PAD/aortic atherosclerosis Mild to moderate in nature seen on CT scan 2018, images pulled up and reviewed with her today most notably in the distal aorta,  iliac vessels  Joint pain  stable  Dental disease Number provided to [employer] for help with her teeth   Total encounter time more than 25 minutes  Greater than 50% was spent in counseling and coordination of care with the patient  Disposition:   F/U 12 months   No orders of the defined types were placed in this encounter.    Signed, Esmond Plants, M.D., Ph.D. 03/28/2019  [place], [place]

## 2019-04-05 ENCOUNTER — Ambulatory Visit (INDEPENDENT_AMBULATORY_CARE_PROVIDER_SITE_OTHER): Payer: Medicare HMO | Admitting: Internal Medicine

## 2019-04-05 ENCOUNTER — Other Ambulatory Visit: Payer: Self-pay

## 2019-04-05 VITALS — BP 124/70 | HR 67 | Temp 98.4°F | Resp 16 | Ht 63.0 in | Wt 129.0 lb

## 2019-04-05 DIAGNOSIS — Z85038 Personal history of other malignant neoplasm of large intestine: Secondary | ICD-10-CM

## 2019-04-05 DIAGNOSIS — R945 Abnormal results of liver function studies: Secondary | ICD-10-CM | POA: Diagnosis not present

## 2019-04-05 DIAGNOSIS — I5022 Chronic systolic (congestive) heart failure: Secondary | ICD-10-CM | POA: Diagnosis not present

## 2019-04-05 DIAGNOSIS — Z0000 Encounter for general adult medical examination without abnormal findings: Secondary | ICD-10-CM | POA: Diagnosis not present

## 2019-04-05 DIAGNOSIS — I472 Ventricular tachycardia: Secondary | ICD-10-CM

## 2019-04-05 DIAGNOSIS — I4729 Other ventricular tachycardia: Secondary | ICD-10-CM

## 2019-04-05 DIAGNOSIS — I110 Hypertensive heart disease with heart failure: Secondary | ICD-10-CM

## 2019-04-05 DIAGNOSIS — R7989 Other specified abnormal findings of blood chemistry: Secondary | ICD-10-CM

## 2019-04-05 DIAGNOSIS — J4520 Mild intermittent asthma, uncomplicated: Secondary | ICD-10-CM | POA: Diagnosis not present

## 2019-04-05 DIAGNOSIS — R739 Hyperglycemia, unspecified: Secondary | ICD-10-CM

## 2019-04-05 DIAGNOSIS — E7800 Pure hypercholesterolemia, unspecified: Secondary | ICD-10-CM

## 2019-04-05 DIAGNOSIS — I10 Essential (primary) hypertension: Secondary | ICD-10-CM

## 2019-04-05 MED ORDER — ALBUTEROL SULFATE HFA 108 (90 BASE) MCG/ACT IN AERS: 2.0000 | INHALATION_SPRAY | Freq: Four times a day (QID) | RESPIRATORY_TRACT | 1 refills | Status: DC | PRN

## 2019-04-05 NOTE — Progress Notes (Signed)
[name], female   DOB: 04-Jun-1928, 86 y.o.   MRN: [id number]   Subjective:    [name], female    DOB: Dec 24, 1928, 86 y.o.   MRN: [id number]  HPI This visit occurred during the SARS-CoV-2 public health emergency.  Safety protocols were in place, including screening questions prior to the visit, additional usage of staff PPE, and extensive cleaning of exam room while observing appropriate contact time as indicated for disinfecting solutions.  Patient here for her physical exam.  She reports she is doing relatively well.  Saw Dr Rockey Situ last week.  Was off cholesterol medication.  Less aching.  Restarted crestor 76m qod.  Also taking lasix 226mqod.  Breathing stable.  No chest pain.  Eating. No acid reflux reported.  No abdominal pain.  Bowels moving.  Taking tylenol arthritis - right knee.  Overall she feels things are stable.      Past Medical History:  Diagnosis Date  . Asthma   . Chronic systolic CH[place] (congestive [place] failure) ([place]   a. 01/2015 Echo: EF 25-30%, sev diff HK, mild to mod MR, mildly dil LA, nl RV fxn, PASP 61 mmHg.  . Colon cancer ([place]  . Hypercholesterolemia   . Hypertension   . Hypertensive heart disease with CHF (congestive heart failure) ([facility]  . NICM (nonischemic cardiomyopathy) ([place]   a. 01/2015 Echo: EF 25-30% sev diff HK;  b. 01/2015 Lexi MV: EF 19%, small defect of mild severity in apex - likely breast attenuation, no ischemia.  . Marland KitchenSVT (nonsustained ventricular tachycardia) ([place]  . Osteoporosis    Past Surgical History:  Procedure Laterality Date  . ABDOMINAL HYSTERECTOMY    . BREAST BIOPSY Left 4/15`   BENIGN BREAST EPITHELIUM WITH NODULAR FIBROSIS.  . Marland KitchenNGUINAL HERNIA REPAIR Left 05/04/2016   Procedure: HERNIA REPAIR INGUINAL ADULT;  Surgeon: JeRobert BellowMD;  Location: [facility] ORS;  Service: General;  Laterality: Left;  . THYROIDECTOMY, PARTIAL  1956  . TUBAL LIGATION     Family History  Problem Relation Age  of Onset  . Stroke Mother   . Colon cancer Father   . Breast cancer Daughter [id number]. Breast cancer Cousin    Social History   Socioeconomic History  . Marital status: Widowed    Spouse name: Not on file  . Number of children: Not on file  . Years of education: Not on file  . Highest education level: Not on file  Occupational History  . Not on file  Tobacco Use  . Smoking status: Never Smoker  . Smokeless tobacco: Never Used  Substance and Sexual Activity  . Alcohol use: No    Alcohol/week: 0.0 standard drinks  . Drug use: No  . Sexual activity: Not on file  Other Topics Concern  . Not on file  Social History Narrative  . Not on file   Social Determinants of Health   Financial Resource Strain:   . Difficulty of Paying Living Expenses:   Food Insecurity:   . Worried About RuCharity fundraisern the Last Year:   . RaArboriculturistn the Last Year:   Transportation Needs:   . LaFilm/video editorMedical):   . Marland Kitchenack of Transportation (Non-Medical):   Physical Activity:   . Days of Exercise per Week:   . Minutes of Exercise per Session:   Stress:   . Feeling of Stress :   Social Connections:   .  Frequency of Communication with Friends and Family:   . Frequency of Social Gatherings with Friends and Family:   . Attends Religious Services:   . Active Member of Clubs or Organizations:   . Attends Archivist Meetings:   Marland Kitchen Marital Status:     Outpatient Encounter Medications as of 04/05/2019  Medication Sig  . acetaminophen (TYLENOL) 325 MG tablet Take 650 mg by mouth every 6 (six) hours as needed.  Marland Kitchen albuterol (VENTOLIN HFA) 108 (90 Base) MCG/ACT inhaler Inhale 2 puffs into the lungs every 6 (six) hours as needed for wheezing or shortness of breath.  Marland Kitchen aspirin (ASPIRIN EC) 81 MG EC tablet Take 81 mg by mouth daily. Swallow whole.  . Cholecalciferol (VITAMIN D-3) 1000 UNITS CAPS Take 2,000 Units by mouth daily.  . furosemide (LASIX) 40 MG tablet Take 1 tablet  (40 mg total) by mouth daily.  . isosorbide mononitrate (IMDUR) 30 MG 24 hr tablet Take 1 tablet (30 mg total) by mouth daily.  . metoprolol succinate (TOPROL-XL) 25 MG 24 hr tablet Take 1 tablet (25 mg total) by mouth daily.  . rosuvastatin (CRESTOR) 5 MG tablet Take 1 tablet (5 mg total) by mouth daily.  . sacubitril-valsartan (ENTRESTO) 97-103 MG Take 1 tablet by mouth 2 (two) times daily.  Marland Kitchen spironolactone (ALDACTONE) 25 MG tablet Take 1 tablet (25 mg total) by mouth daily.  . [DISCONTINUED] albuterol (PROVENTIL HFA;VENTOLIN HFA) 108 (90 Base) MCG/ACT inhaler Inhale 2 puffs into the lungs every 6 (six) hours as needed for wheezing or shortness of breath.   No facility-administered encounter medications on file as of 04/05/2019.    Review of Systems  Constitutional: Negative for appetite change and unexpected weight change.  HENT: Negative for congestion and sinus pressure.   Eyes: Negative for pain and visual disturbance.  Respiratory: Negative for cough and chest tightness.        Breathing stable.   Cardiovascular: Negative for chest pain, palpitations and leg swelling.  Gastrointestinal: Negative for abdominal pain, diarrhea, nausea and vomiting.  Genitourinary: Negative for difficulty urinating and dysuria.  Musculoskeletal: Negative for joint swelling and myalgias.  Skin: Negative for color change and rash.  Neurological: Negative for dizziness, light-headedness and headaches.  Hematological: Negative for adenopathy. Does not bruise/bleed easily.  Psychiatric/Behavioral: Negative for agitation and dysphoric mood.       Objective:    Physical Exam Constitutional:      General: She is not in acute distress.    Appearance: Normal appearance. She is well-developed.  HENT:     Head: Normocephalic and atraumatic.     Right Ear: External ear normal.     Left Ear: External ear normal.  Eyes:     General: No scleral icterus.       Right eye: No discharge.        Left eye: No  discharge.     Conjunctiva/sclera: Conjunctivae normal.  Neck:     Thyroid: No thyromegaly.  Cardiovascular:     Rate and Rhythm: Normal rate and regular rhythm.  Pulmonary:     Effort: No tachypnea, accessory muscle usage or respiratory distress.     Breath sounds: Normal breath sounds. No decreased breath sounds or wheezing.  Chest:     Breasts:        Right: No inverted nipple, mass, nipple discharge or tenderness (no axillary adenopathy).        Left: No inverted nipple, mass, nipple discharge or tenderness (no axilarry adenopathy).  Abdominal:  General: Bowel sounds are normal.     Palpations: Abdomen is soft.     Tenderness: There is no abdominal tenderness.  Musculoskeletal:        General: No swelling or tenderness.     Cervical back: Neck supple. No tenderness.  Lymphadenopathy:     Cervical: No cervical adenopathy.  Skin:    Findings: No rash.  Neurological:     Mental Status: She is alert and oriented to person, place, and time.  Psychiatric:        Mood and Affect: Mood normal.        Behavior: Behavior normal.     BP 124/70   Pulse 67   Temp 98.4 F (36.9 C)   Resp 16   Ht 5' 3"  (1.6 m)   Wt 129 lb (58.5 kg)   LMP 02/21/1966   SpO2 96%   BMI 22.85 kg/m  Wt Readings from Last 3 Encounters:  04/05/19 129 lb (58.5 kg)  03/28/19 129 lb (58.5 kg)  11/24/18 130 lb (59 kg)     Lab Results  Component Value Date   WBC 4.1 12/06/2018   HGB 12.0 12/06/2018   HCT 35.7 (L) 12/06/2018   PLT 196.0 12/06/2018   GLUCOSE 91 12/15/2018   CHOL 212 (H) 12/06/2018   TRIG 53.0 12/06/2018   HDL 72.50 12/06/2018   LDLDIRECT 128.3 02/08/2013   LDLCALC 129 (H) 12/06/2018   ALT 9 12/06/2018   AST 16 12/06/2018   NA 141 12/15/2018   K 4.5 12/15/2018   CL 101 12/15/2018   CREATININE 1.28 (H) 12/15/2018   BUN 26 (H) 12/15/2018   CO2 32 12/15/2018   TSH 0.64 12/06/2018   INR 1.0 12/09/2015   HGBA1C 6.1 12/06/2018       Assessment & Plan:   Problem List  Items Addressed This Visit    Abnormal liver function tests    Last liver check wnl.  Follow.        Asthma    Has been followed by pulmonary.  Breathing stable.  Has albuterol if needed.        Relevant Medications   albuterol (VENTOLIN HFA) 108 (90 Base) MCG/ACT inhaler   Chronic systolic heart failure (HCC) ([place]ronic)    Followed by cardiology.  Taking lasix qod now.  Breathing stable. No evidence of volume overload.  Follow        Relevant Orders   Basic metabolic panel   Essential hypertension    Blood pressure under good control.  Continue same medication regimen spironolactone and lasix.   Follow pressures.  Follow metabolic panel.        Health care maintenance    Physical today 04/05/19.  S/p hysterectomy.  Mammogram 11/24/18 - birads I.  Colonoscopy 05/2012.        History of colon cancer    Colonoscopy 2014.  Check CEA.        Relevant Orders   CEA   Hypercholesterolemia    Back on crestor now.  Restarted qod.  Follow lipid panel and liver function tests.  Monitor for side effects.        Relevant Orders   Hepatic function panel   Lipid panel   Hyperglycemia    Low carb diet and exercise.  Follow met b and a1c.       Relevant Orders   Hemoglobin A1c   Hypertensive heart disease with CHF (congestive heart failure) ([place])    Followed by cardiology.  Taking lasix  qod.  Continue spironolactone.  No evidence of volume overload.  Follow.        NSVT (nonsustained ventricular tachycardia) ([place])    Documented per cardiology.  Doing well on toprol.  Follow.            Einar Pheasant, MD

## 2019-04-14 ENCOUNTER — Encounter: Payer: Self-pay | Admitting: Internal Medicine

## 2019-04-14 NOTE — Assessment & Plan Note (Signed)
Documented per cardiology.  Doing well on toprol.  Follow.

## 2019-04-14 NOTE — Assessment & Plan Note (Signed)
Physical today 04/05/19.  S/p hysterectomy.  Mammogram 11/24/18 - birads I.  Colonoscopy 05/2012.

## 2019-04-14 NOTE — Assessment & Plan Note (Signed)
Has been followed by pulmonary.  Breathing stable.  Has albuterol if needed.

## 2019-04-14 NOTE — Assessment & Plan Note (Signed)
Followed by cardiology.  Taking lasix qod.  Continue spironolactone.  No evidence of volume overload.  Follow.

## 2019-04-14 NOTE — Assessment & Plan Note (Signed)
Low carb diet and exercise.  Follow met b and a1c.  

## 2019-04-14 NOTE — Assessment & Plan Note (Signed)
Back on crestor now.  Restarted qod.  Follow lipid panel and liver function tests.  Monitor for side effects.

## 2019-04-14 NOTE — Assessment & Plan Note (Signed)
Blood pressure under good control.  Continue same medication regimen spironolactone and lasix.   Follow pressures.  Follow metabolic panel.

## 2019-04-14 NOTE — Assessment & Plan Note (Signed)
Followed by cardiology.  Taking lasix qod now.  Breathing stable. No evidence of volume overload.  Follow

## 2019-04-14 NOTE — Assessment & Plan Note (Signed)
Colonoscopy 2014.  Check CEA.

## 2019-04-14 NOTE — Assessment & Plan Note (Signed)
Last liver check wnl.  Follow.

## 2019-05-03 ENCOUNTER — Other Ambulatory Visit: Payer: Self-pay

## 2019-05-03 ENCOUNTER — Other Ambulatory Visit (INDEPENDENT_AMBULATORY_CARE_PROVIDER_SITE_OTHER): Payer: Medicare HMO

## 2019-05-03 DIAGNOSIS — I5022 Chronic systolic (congestive) heart failure: Secondary | ICD-10-CM | POA: Diagnosis not present

## 2019-05-03 DIAGNOSIS — Z85038 Personal history of other malignant neoplasm of large intestine: Secondary | ICD-10-CM

## 2019-05-03 DIAGNOSIS — E7800 Pure hypercholesterolemia, unspecified: Secondary | ICD-10-CM

## 2019-05-03 DIAGNOSIS — R739 Hyperglycemia, unspecified: Secondary | ICD-10-CM

## 2019-05-03 LAB — BASIC METABOLIC PANEL
BUN: 25 mg/dL — ABNORMAL HIGH (ref 6–23)
CO2: 32 mEq/L (ref 19–32)
Calcium: 9.2 mg/dL (ref 8.4–10.5)
Chloride: 102 mEq/L (ref 96–112)
Creatinine, Ser: 1.19 mg/dL (ref 0.40–1.20)
GFR: 51.66 mL/min — ABNORMAL LOW (ref >60.00)
Glucose, Bld: 101 mg/dL — ABNORMAL HIGH (ref 70–99)
Potassium: 4.2 mEq/L (ref 3.5–5.1)
Sodium: 140 mEq/L (ref 135–145)

## 2019-05-03 LAB — HEPATIC FUNCTION PANEL
ALT: 10 U/L (ref 0–35)
AST: 17 U/L (ref 0–37)
Albumin: 4.2 g/dL (ref 3.5–5.2)
Alkaline Phosphatase: 55 U/L (ref 39–117)
Bilirubin, Direct: 0.1 mg/dL (ref 0.0–0.3)
Total Bilirubin: 0.5 mg/dL (ref 0.2–1.2)
Total Protein: 6.8 g/dL (ref 6.0–8.3)

## 2019-05-03 LAB — LIPID PANEL
Cholesterol: 154 mg/dL (ref 0–200)
HDL: 77.3 mg/dL (ref >39.00)
LDL Cholesterol: 66 mg/dL (ref 0–99)
NonHDL: 76.53
Total CHOL/HDL Ratio: 2
Triglycerides: 52 mg/dL (ref 0.0–149.0)
VLDL: 10.4 mg/dL (ref 0.0–40.0)

## 2019-05-03 LAB — HEMOGLOBIN A1C: Hgb A1c MFr Bld: 6.1 % (ref 4.6–6.5)

## 2019-05-04 LAB — CEA: CEA: 3.1 ng/mL — ABNORMAL HIGH

## 2019-05-09 ENCOUNTER — Other Ambulatory Visit: Payer: Self-pay | Admitting: Internal Medicine

## 2019-05-09 ENCOUNTER — Telehealth: Payer: Self-pay | Admitting: Internal Medicine

## 2019-05-09 DIAGNOSIS — Z85038 Personal history of other malignant neoplasm of large intestine: Secondary | ICD-10-CM

## 2019-05-09 NOTE — Telephone Encounter (Signed)
-----   Message from Lloyd Huger, MD sent at 05/08/2019  6:49 AM EDT ----- Regarding: RE: question Typically I'll recheck the CEA in 4-6 weeks and then again 3 months to see if it's is truly trending up. If it is, I'll then get CTs to see what is going on.  Hope this helps!  -Tim ----- Message ----- From: Einar Pheasant, MD Sent: 05/08/2019   5:21 AM EDT To: Lloyd Huger, MD Subject: question                                       Ms Brissett has a history of colon cancer.  Previously saw Dr Oliva Bustard.  Last colonoscopy 2014.  Have been following CEA level.  Her CEA level has been stable.  Recent check - slightly increased - 3.1.  I wanted to get your recommendation regarding follow up/recheck (or if there is anything more I need to do).  She is doing fine.    Thank you for your help. Jackline Castilla

## 2019-05-09 NOTE — Progress Notes (Signed)
Order placed for f/u CEA.

## 2019-05-09 NOTE — Telephone Encounter (Signed)
-----   Message from Lloyd Huger, MD sent at 05/08/2019  6:49 AM EDT ----- Regarding: RE: question Typically I'll recheck the CEA in 4-6 weeks and then again 3 months to see if it's is truly trending up. If it is, I'll then get CTs to see what is going on.  Hope this helps!  -Tim ----- Message ----- From: Einar Pheasant, MD Sent: 05/08/2019   5:21 AM EDT To: Lloyd Huger, MD Subject: question                                       Ms Gillyard has a history of colon cancer.  Previously saw Dr Oliva Bustard.  Last colonoscopy 2014.  Have been following CEA level.  Her CEA level has been stable.  Recent check - slightly increased - 3.1.  I wanted to get your recommendation regarding follow up/recheck (or if there is anything more I need to do).  She is doing fine.    Thank you for your help. Reggie Bise

## 2019-05-10 ENCOUNTER — Telehealth: Payer: Self-pay | Admitting: *Deleted

## 2019-05-10 NOTE — Telephone Encounter (Signed)
-----   Message from Einar Pheasant, MD sent at 05/09/2019  7:13 AM EDT ----- Notify pt that her CEA level is slightly elevated.  This is a marker we follow given her colon history.  When just slightly elevated, we need to repeat in 4-6 weeks.  Schedule non fasting lab appt.  Overall sugar control stable. Kidney function improved from previous check.  Sodium, potassium and liver function tests are wnl.  Cholesterol levels improved and look good.

## 2019-05-10 NOTE — Telephone Encounter (Signed)
Left message for patient to return call  to office concerning results.

## 2019-05-17 ENCOUNTER — Other Ambulatory Visit: Payer: Self-pay | Admitting: Cardiovascular Disease

## 2019-06-07 ENCOUNTER — Other Ambulatory Visit (INDEPENDENT_AMBULATORY_CARE_PROVIDER_SITE_OTHER): Payer: Medicare HMO

## 2019-06-07 ENCOUNTER — Other Ambulatory Visit: Payer: Self-pay

## 2019-06-07 DIAGNOSIS — Z85038 Personal history of other malignant neoplasm of large intestine: Secondary | ICD-10-CM | POA: Diagnosis not present

## 2019-06-08 ENCOUNTER — Other Ambulatory Visit: Payer: Self-pay | Admitting: Internal Medicine

## 2019-06-08 DIAGNOSIS — Z85038 Personal history of other malignant neoplasm of large intestine: Secondary | ICD-10-CM

## 2019-06-08 DIAGNOSIS — R739 Hyperglycemia, unspecified: Secondary | ICD-10-CM

## 2019-06-08 DIAGNOSIS — E7800 Pure hypercholesterolemia, unspecified: Secondary | ICD-10-CM

## 2019-06-08 DIAGNOSIS — I10 Essential (primary) hypertension: Secondary | ICD-10-CM

## 2019-06-08 LAB — CEA: CEA: 3.1 ng/mL — ABNORMAL HIGH

## 2019-06-26 ENCOUNTER — Other Ambulatory Visit: Payer: Self-pay | Admitting: Cardiovascular Disease

## 2019-06-26 ENCOUNTER — Other Ambulatory Visit: Payer: Self-pay | Admitting: *Deleted

## 2019-06-26 MED ORDER — FUROSEMIDE 40 MG PO TABS: ORAL_TABLET | ORAL | 3 refills | Status: DC

## 2019-06-28 ENCOUNTER — Observation Stay
Admission: EM | Admit: 2019-06-28 | Discharge: 2019-06-29 | Disposition: A | Discharge status: Home / Self Care | Payer: Medicare HMO | Attending: Internal Medicine | Admitting: Internal Medicine

## 2019-06-28 ENCOUNTER — Encounter: Payer: Self-pay | Admitting: Emergency Medicine

## 2019-06-28 ENCOUNTER — Inpatient Hospital Stay: Payer: Medicare HMO

## 2019-06-28 ENCOUNTER — Emergency Department: Payer: Medicare HMO

## 2019-06-28 ENCOUNTER — Other Ambulatory Visit: Payer: Self-pay

## 2019-06-28 DIAGNOSIS — E860 Dehydration: Secondary | ICD-10-CM | POA: Insufficient documentation

## 2019-06-28 DIAGNOSIS — I10 Essential (primary) hypertension: Secondary | ICD-10-CM | POA: Diagnosis present

## 2019-06-28 DIAGNOSIS — I472 Ventricular tachycardia: Secondary | ICD-10-CM | POA: Insufficient documentation

## 2019-06-28 DIAGNOSIS — I130 Hypertensive heart and chronic kidney disease with heart failure and stage 1 through stage 4 chronic kidney disease, or unspecified chronic kidney disease: Secondary | ICD-10-CM | POA: Diagnosis not present

## 2019-06-28 DIAGNOSIS — E7800 Pure hypercholesterolemia, unspecified: Secondary | ICD-10-CM | POA: Diagnosis not present

## 2019-06-28 DIAGNOSIS — I4729 Other ventricular tachycardia: Secondary | ICD-10-CM

## 2019-06-28 DIAGNOSIS — Z7982 Long term (current) use of aspirin: Secondary | ICD-10-CM | POA: Insufficient documentation

## 2019-06-28 DIAGNOSIS — I5022 Chronic systolic (congestive) heart failure: Secondary | ICD-10-CM | POA: Diagnosis present

## 2019-06-28 DIAGNOSIS — E890 Postprocedural hypothyroidism: Secondary | ICD-10-CM | POA: Insufficient documentation

## 2019-06-28 DIAGNOSIS — K56609 Unspecified intestinal obstruction, unspecified as to partial versus complete obstruction: Secondary | ICD-10-CM

## 2019-06-28 DIAGNOSIS — Z85038 Personal history of other malignant neoplasm of large intestine: Secondary | ICD-10-CM | POA: Insufficient documentation

## 2019-06-28 DIAGNOSIS — R109 Unspecified abdominal pain: Secondary | ICD-10-CM

## 2019-06-28 DIAGNOSIS — E785 Hyperlipidemia, unspecified: Secondary | ICD-10-CM | POA: Diagnosis not present

## 2019-06-28 DIAGNOSIS — Z79899 Other long term (current) drug therapy: Secondary | ICD-10-CM | POA: Diagnosis not present

## 2019-06-28 DIAGNOSIS — N1831 Chronic kidney disease, stage 3a: Secondary | ICD-10-CM | POA: Insufficient documentation

## 2019-06-28 DIAGNOSIS — I700 Atherosclerosis of aorta: Secondary | ICD-10-CM | POA: Insufficient documentation

## 2019-06-28 DIAGNOSIS — N183 Chronic kidney disease, stage 3 unspecified: Secondary | ICD-10-CM | POA: Diagnosis present

## 2019-06-28 DIAGNOSIS — I5042 Chronic combined systolic (congestive) and diastolic (congestive) heart failure: Secondary | ICD-10-CM | POA: Insufficient documentation

## 2019-06-28 DIAGNOSIS — Z20822 Contact with and (suspected) exposure to covid-19: Secondary | ICD-10-CM | POA: Diagnosis not present

## 2019-06-28 DIAGNOSIS — K56600 Partial intestinal obstruction, unspecified as to cause: Principal | ICD-10-CM | POA: Diagnosis present

## 2019-06-28 DIAGNOSIS — J45909 Unspecified asthma, uncomplicated: Secondary | ICD-10-CM | POA: Insufficient documentation

## 2019-06-28 LAB — URINALYSIS, COMPLETE (UACMP) WITH MICROSCOPIC
Bilirubin Urine: NEGATIVE
Glucose, UA: NEGATIVE mg/dL
Hgb urine dipstick: NEGATIVE
Ketones, ur: 5 mg/dL — AB
Nitrite: NEGATIVE
Protein, ur: NEGATIVE mg/dL
Specific Gravity, Urine: 1.041 — ABNORMAL HIGH (ref 1.005–1.030)
pH: 6 (ref 5.0–8.0)

## 2019-06-28 LAB — CBC WITH DIFFERENTIAL/PLATELET
Abs Immature Granulocytes: 0.01 10*3/uL (ref 0.00–0.07)
Basophils Absolute: 0 10*3/uL (ref 0.0–0.1)
Basophils Relative: 0 %
Eosinophils Absolute: 0 10*3/uL (ref 0.0–0.5)
Eosinophils Relative: 0 %
HCT: 35.8 % — ABNORMAL LOW (ref 36.0–46.0)
Hemoglobin: 11.4 g/dL — ABNORMAL LOW (ref 12.0–15.0)
Immature Granulocytes: 0 %
Lymphocytes Relative: 31 %
Lymphs Abs: 1.6 10*3/uL (ref 0.7–4.0)
MCH: 29.8 pg (ref 26.0–34.0)
MCHC: 31.8 g/dL (ref 30.0–36.0)
MCV: 93.5 fL (ref 80.0–100.0)
Monocytes Absolute: 0.4 10*3/uL (ref 0.1–1.0)
Monocytes Relative: 7 %
Neutro Abs: 3.2 10*3/uL (ref 1.7–7.7)
Neutrophils Relative %: 62 %
Platelets: 195 10*3/uL (ref 150–400)
RBC: 3.83 MIL/uL — ABNORMAL LOW (ref 3.87–5.11)
RDW: 12.2 % (ref 11.5–15.5)
WBC: 5.2 10*3/uL (ref 4.0–10.5)
nRBC: 0 % (ref 0.0–0.2)

## 2019-06-28 LAB — COMPREHENSIVE METABOLIC PANEL
ALT: 13 U/L (ref 0–44)
AST: 21 U/L (ref 15–41)
Albumin: 4.3 g/dL (ref 3.5–5.0)
Alkaline Phosphatase: 49 U/L (ref 38–126)
Anion gap: 11 (ref 5–15)
BUN: 29 mg/dL — ABNORMAL HIGH (ref 8–23)
CO2: 27 mmol/L (ref 22–32)
Calcium: 9.5 mg/dL (ref 8.9–10.3)
Chloride: 102 mmol/L (ref 98–111)
Creatinine, Ser: 1.13 mg/dL — ABNORMAL HIGH (ref 0.44–1.00)
GFR calc Af Amer: 50 mL/min — ABNORMAL LOW (ref >60)
GFR calc non Af Amer: 43 mL/min — ABNORMAL LOW (ref >60)
Glucose, Bld: 142 mg/dL — ABNORMAL HIGH (ref 70–99)
Potassium: 4 mmol/L (ref 3.5–5.1)
Sodium: 140 mmol/L (ref 135–145)
Total Bilirubin: 0.7 mg/dL (ref 0.3–1.2)
Total Protein: 7.3 g/dL (ref 6.5–8.1)

## 2019-06-28 LAB — PROTIME-INR
INR: 0.9 (ref 0.8–1.2)
Prothrombin Time: 11.4 seconds (ref 11.4–15.2)

## 2019-06-28 LAB — BRAIN NATRIURETIC PEPTIDE: B Natriuretic Peptide: 45.6 pg/mL (ref 0.0–100.0)

## 2019-06-28 LAB — TROPONIN I (HIGH SENSITIVITY): Troponin I (High Sensitivity): <2 ng/L (ref <18)

## 2019-06-28 LAB — LIPASE, BLOOD: Lipase: 57 U/L — ABNORMAL HIGH (ref 11–51)

## 2019-06-28 LAB — TYPE AND SCREEN
ABO/RH(D): B POS
Antibody Screen: NEGATIVE

## 2019-06-28 LAB — APTT: aPTT: 27 seconds (ref 24–36)

## 2019-06-28 LAB — SARS CORONAVIRUS 2 BY RT PCR (HOSPITAL ORDER, PERFORMED IN ~~LOC~~ HOSPITAL LAB): SARS Coronavirus 2: NEGATIVE

## 2019-06-28 MED ORDER — MORPHINE SULFATE (PF) 4 MG/ML IV SOLN
4.0000 mg | Freq: Once | INTRAVENOUS | Status: AC
  Administered 2019-06-28: 4 mg via INTRAVENOUS
  Filled 2019-06-28: qty 1

## 2019-06-28 MED ORDER — ACETAMINOPHEN 325 MG PO TABS
650.0000 mg | ORAL_TABLET | Freq: Four times a day (QID) | ORAL | Status: DC | PRN
  Administered 2019-06-28: 21:00:00 650 mg via ORAL
  Filled 2019-06-28: qty 2

## 2019-06-28 MED ORDER — SODIUM CHLORIDE 0.9 % IV SOLN: INTRAVENOUS | Status: DC

## 2019-06-28 MED ORDER — DICYCLOMINE HCL 10 MG PO CAPS
10.0000 mg | ORAL_CAPSULE | Freq: Three times a day (TID) | ORAL | Status: DC
  Administered 2019-06-28 – 2019-06-29 (×4): 10 mg via ORAL
  Filled 2019-06-28 (×7): qty 1

## 2019-06-28 MED ORDER — METOPROLOL SUCCINATE ER 25 MG PO TB24
25.0000 mg | ORAL_TABLET | Freq: Every day | ORAL | Status: DC
  Administered 2019-06-29: 09:00:00 25 mg via ORAL
  Filled 2019-06-28: qty 1

## 2019-06-28 MED ORDER — HYDRALAZINE HCL 20 MG/ML IJ SOLN
5.0000 mg | INTRAMUSCULAR | Status: DC | PRN
  Administered 2019-06-28: 5 mg via INTRAVENOUS
  Filled 2019-06-28: qty 1

## 2019-06-28 MED ORDER — ONDANSETRON HCL 4 MG/2ML IJ SOLN
4.0000 mg | Freq: Once | INTRAMUSCULAR | Status: AC
  Administered 2019-06-28: 4 mg via INTRAVENOUS
  Filled 2019-06-28: qty 2

## 2019-06-28 MED ORDER — VITAMIN D 25 MCG (1000 UNIT) PO TABS
2000.0000 [IU] | ORAL_TABLET | Freq: Every day | ORAL | Status: DC
  Administered 2019-06-29: 2000 [IU] via ORAL
  Filled 2019-06-28: qty 2

## 2019-06-28 MED ORDER — ALBUTEROL SULFATE (2.5 MG/3ML) 0.083% IN NEBU: 2.5000 mg | INHALATION_SOLUTION | RESPIRATORY_TRACT | Status: DC | PRN

## 2019-06-28 MED ORDER — MORPHINE SULFATE (PF) 2 MG/ML IV SOLN
2.0000 mg | Freq: Once | INTRAVENOUS | Status: AC
  Administered 2019-06-28: 2 mg via INTRAVENOUS
  Filled 2019-06-28: qty 1

## 2019-06-28 MED ORDER — FAMOTIDINE IN NACL 20-0.9 MG/50ML-% IV SOLN
20.0000 mg | Freq: Two times a day (BID) | INTRAVENOUS | Status: DC
  Administered 2019-06-28: 21:00:00 20 mg via INTRAVENOUS
  Filled 2019-06-28: qty 50

## 2019-06-28 MED ORDER — SACUBITRIL-VALSARTAN 97-103 MG PO TABS
1.0000 | ORAL_TABLET | Freq: Two times a day (BID) | ORAL | Status: DC
  Administered 2019-06-28 – 2019-06-29 (×2): 1 via ORAL
  Filled 2019-06-28 (×4): qty 1

## 2019-06-28 MED ORDER — ASPIRIN 81 MG PO TBEC
81.0000 mg | DELAYED_RELEASE_TABLET | Freq: Every day | ORAL | Status: DC
  Administered 2019-06-29: 81 mg via ORAL
  Filled 2019-06-28 (×3): qty 1

## 2019-06-28 MED ORDER — MORPHINE SULFATE (PF) 2 MG/ML IV SOLN
1.0000 mg | INTRAVENOUS | Status: DC | PRN
  Administered 2019-06-28: 19:00:00 1 mg via INTRAVENOUS
  Filled 2019-06-28: qty 1

## 2019-06-28 MED ORDER — ONDANSETRON HCL 4 MG/2ML IJ SOLN: 4.0000 mg | Freq: Three times a day (TID) | INTRAMUSCULAR | Status: DC | PRN

## 2019-06-28 MED ORDER — ROSUVASTATIN CALCIUM 10 MG PO TABS
5.0000 mg | ORAL_TABLET | Freq: Every day | ORAL | Status: DC
  Administered 2019-06-29: 09:00:00 5 mg via ORAL
  Filled 2019-06-28: qty 1

## 2019-06-28 MED ORDER — MORPHINE SULFATE (PF) 2 MG/ML IV SOLN
0.5000 mg | INTRAVENOUS | Status: DC | PRN
  Administered 2019-06-28 (×2): 0.5 mg via INTRAVENOUS
  Filled 2019-06-28 (×2): qty 1

## 2019-06-28 MED ORDER — ISOSORBIDE MONONITRATE ER 30 MG PO TB24
30.0000 mg | ORAL_TABLET | Freq: Every day | ORAL | Status: DC
  Administered 2019-06-29: 09:00:00 30 mg via ORAL
  Filled 2019-06-28: qty 1

## 2019-06-28 MED ORDER — ALBUTEROL SULFATE HFA 108 (90 BASE) MCG/ACT IN AERS: 2.0000 | INHALATION_SPRAY | RESPIRATORY_TRACT | Status: DC | PRN

## 2019-06-28 MED ORDER — IOHEXOL 300 MG/ML  SOLN
100.0000 mL | Freq: Once | INTRAMUSCULAR | Status: AC | PRN
  Administered 2019-06-28: 100 mL via INTRAVENOUS

## 2019-06-28 NOTE — ED Provider Notes (Signed)
[facility] Emergency Department Provider Note  ____________________________________________   First MD Initiated Contact with Patient 06/28/19 0340     (approximate)  I have reviewed the triage vital signs and the nursing notes.   HISTORY  Chief Complaint Abdominal Pain   HPI Kathy Dominguez is a 86 y.o. female with below list of previous medical conditions    Including chronic systolic congestive heart failure with an EF of 25 to 30% history of colon cancer presents to the emergency department secondary to cute onset of mid to right abdominal pain is currently 10 out of 10 that awoke the patient from sleep this morning.  Patient admits to nausea and one episode of nonbloody emesis.  Patient denies any diarrhea.  Patient denies any fever no urinary symptoms.    Past Medical History:  Diagnosis Date  . Asthma   . Chronic systolic CHF (congestive heart failure) ([place])    a. 01/2015 Echo: EF 25-30%, sev diff HK, mild to mod MR, mildly dil LA, nl RV fxn, PASP 61 mmHg.  . Colon cancer ([place]) 2005  . Hypercholesterolemia   . Hypertension   . Hypertensive heart disease with CHF (congestive heart failure) ([place])   . NICM (nonischemic cardiomyopathy) ([place])    a. 01/2015 Echo: EF 25-30% sev diff HK;  b. 01/2015 Lexi MV: EF 19%, small defect of mild severity in apex - likely breast attenuation, no ischemia.  Marland Kitchen NSVT (nonsustained ventricular tachycardia) ([place])   . Osteoporosis     Patient Active Problem List   Diagnosis Date Noted  . Partial small bowel obstruction ([place]) 06/28/2019  . CKD (chronic kidney disease), stage IIIa 06/28/2019  . Hyperglycemia 11/24/2018  . Breast cancer screening 11/24/2018  . NICM (nonischemic cardiomyopathy) ([place]) 12/19/2017  . Burning sensation of feet 11/26/2017  . Inguinal hernia of left side with obstruction   . Chronic systolic heart failure ([place]) 02/16/2016  . Hypotension 02/16/2016  . Hypertensive heart disease with CHF  (congestive heart failure) ([place]) 01/30/2016  . NSVT (nonsustained ventricular tachycardia) ([place]) 01/30/2016  . Hypokalemia 01/30/2016  . Abnormal liver function tests 12/04/2015  . Right knee pain 06/25/2015  . Right leg pain 02/24/2015  . Health care maintenance 06/13/2014  . Leg skin lesion, right 06/24/2013  . Lump or mass in breast 05/21/2013  . Leukopenia 02/23/2012  . History of colon cancer 02/23/2012  . Asthma 02/23/2012  . Essential hypertension 02/22/2012  . Hypercholesterolemia 02/22/2012    Past Surgical History:  Procedure Laterality Date  . ABDOMINAL HYSTERECTOMY    . BREAST BIOPSY Left 4/15`   BENIGN BREAST EPITHELIUM WITH NODULAR FIBROSIS.  Marland Kitchen INGUINAL HERNIA REPAIR Left 05/04/2016   Procedure: HERNIA REPAIR INGUINAL ADULT;  Surgeon: Robert Bellow, MD;  Location: [facility] ORS;  Service: General;  Laterality: Left;  . THYROIDECTOMY, PARTIAL  1956  . TUBAL LIGATION      Prior to Admission medications   Medication Sig Start Date End Date Taking? Authorizing Provider  aspirin (ASPIRIN EC) 81 MG EC tablet Take 81 mg by mouth daily. Swallow whole.   Yes [provider]  Cholecalciferol (VITAMIN D-3) 1000 UNITS CAPS Take 2,000 Units by mouth daily.   Yes [provider]  furosemide (LASIX) 40 MG tablet Take 1/2 tablet-1 tablet as directed per weight gain. 06/26/19  Yes Minna Merritts, MD  isosorbide mononitrate (IMDUR) 30 MG 24 hr tablet Take 1 tablet (30 mg total) by mouth daily. 05/17/19  Yes Minna Merritts, MD  metoprolol succinate (TOPROL-XL) 25 MG 24 hr tablet Take 1 tablet (25 mg total) by mouth daily. 03/28/19  Yes Gollan, Kathlene November, MD  rosuvastatin (CRESTOR) 5 MG tablet Take 1 tablet (5 mg total) by mouth daily. 03/28/19  Yes Gollan, Kathlene November, MD  sacubitril-valsartan (ENTRESTO) 97-103 MG Take 1 tablet by mouth 2 (two) times daily. 03/28/19  Yes Minna Merritts, MD  spironolactone (ALDACTONE) 25 MG tablet Take 1 tablet (25 mg total) by mouth  daily. 03/28/19  Yes Minna Merritts, MD  acetaminophen (TYLENOL) 325 MG tablet Take 650 mg by mouth every 6 (six) hours as needed.    [provider]  albuterol (VENTOLIN HFA) 108 (90 Base) MCG/ACT inhaler Inhale 2 puffs into the lungs every 6 (six) hours as needed for wheezing or shortness of breath. 04/05/19   Einar Pheasant, MD    Allergies Patient has no known allergies.  Family History  Problem Relation Age of Onset  . Stroke Mother   . Colon cancer Father   . Breast cancer Daughter 2  . Breast cancer Cousin     Social History Social History   Tobacco Use  . Smoking status: Never Smoker  . Smokeless tobacco: Never Used  Substance Use Topics  . Alcohol use: No    Alcohol/week: 0.0 standard drinks  . Drug use: No    Review of Systems Constitutional: No fever/chills Eyes: No visual changes. ENT: No sore throat. Cardiovascular: Denies chest pain. Respiratory: Denies shortness of breath. Gastrointestinal: Positive for abdominal pain and vomiting Genitourinary: Negative for dysuria. Musculoskeletal: Negative for neck pain.  Negative for back pain. Integumentary: Negative for rash. Neurological: Negative for headaches, focal weakness or numbness.  ____________________________________________   PHYSICAL EXAM:  VITAL SIGNS: ED Triage Vitals  Enc Vitals Group     BP 06/28/19 0316 (!) 181/96     Pulse Rate 06/28/19 0316 77     Resp 06/28/19 0316 18     Temp 06/28/19 0316 97.6 F (36.4 C)     Temp Source 06/28/19 0316 Oral     SpO2 06/28/19 0316 95 %     Weight 06/28/19 0317 59 kg (130 lb)     Height 06/28/19 0317 1.575 m (5\' 2" )     Head Circumference --      Peak Flow --      Pain Score 06/28/19 0317 10     Pain Loc --      Pain Edu? --      Excl. in [place]? --     Constitutional: Alert and oriented.  Eyes: Conjunctivae are normal. . Mouth/Throat: Patient is wearing a mask. Neck: No stridor.  No meningeal signs.   Cardiovascular: Normal rate,  regular rhythm. Good peripheral circulation. Grossly normal heart sounds. Respiratory: Normal respiratory effort.  No retractions. Gastrointestinal: Right upper quadrant/right lower quadrant tenderness to palpation.  Voluntary guarding no rebound Musculoskeletal: No lower extremity tenderness nor edema. No gross deformities of extremities. Neurologic:  Normal speech and language. No gross focal neurologic deficits are appreciated.  Skin:  Skin is warm, dry and intact. Psychiatric: Mood and affect are normal. Speech and behavior are normal.  ____________________________________________   LABS (all labs ordered are listed, but only abnormal results are displayed)  Labs Reviewed  CBC WITH DIFFERENTIAL/PLATELET - Abnormal; Notable for the following components:      Result Value   RBC 3.83 (*)    Hemoglobin 11.4 (*)    HCT 35.8 (*)    All other components  within normal limits  COMPREHENSIVE METABOLIC PANEL - Abnormal; Notable for the following components:   Glucose, Bld 142 (*)    BUN 29 (*)    Creatinine, Ser 1.13 (*)    GFR calc non Af Amer 43 (*)    GFR calc Af Amer 50 (*)    All other components within normal limits  LIPASE, BLOOD - Abnormal; Notable for the following components:   Lipase 57 (*)    All other components within normal limits  URINALYSIS, COMPLETE (UACMP) WITH MICROSCOPIC - Abnormal; Notable for the following components:   Color, Urine YELLOW (*)    APPearance CLOUDY (*)    Specific Gravity, Urine 1.041 (*)    Ketones, ur 5 (*)    Leukocytes,Ua LARGE (*)    Bacteria, UA FEW (*)    Non Squamous Epithelial PRESENT (*)    All other components within normal limits  SARS CORONAVIRUS 2 BY RT PCR (HOSPITAL ORDER, [place] [facility])  URINE CULTURE  BRAIN NATRIURETIC PEPTIDE  PROTIME-INR  APTT  BASIC METABOLIC PANEL  CBC  TYPE AND SCREEN  TROPONIN I (HIGH SENSITIVITY)   ____________________________________________  EKG  ED ECG REPORT I,  Bendena N Taniyah Ballow, the attending physician, personally viewed and interpreted this ECG.   Date: 06/28/2019  EKG Time: 3:22 AM  Rate: 65  Rhythm: Normal sinus rhythm  Axis: Normal  Intervals: Normal  ST&T Change: None  ____________________________________________  RADIOLOGY I,  N Tammie Yanda, personally viewed and evaluated these images (plain radiographs) as part of my medical decision making, as well as reviewing the written report by the radiologist.  ED MD interpretation: CT scan findings concerning for possible partial small bowel obstruction.  Official radiology report(s): CT ABDOMEN PELVIS W CONTRAST  Result Date: 06/28/2019 CLINICAL DATA:  Nausea abdominal pain distension EXAM: CT ABDOMEN AND PELVIS WITH CONTRAST TECHNIQUE: Multidetector CT imaging of the abdomen and pelvis was performed using the standard protocol following bolus administration of intravenous contrast. CONTRAST:  18mL OMNIPAQUE IOHEXOL 300 MG/ML  SOLN COMPARISON:  May 03, 2016 FINDINGS: Lower chest: The visualized heart size within normal limits. No pericardial fluid/thickening. There is a small paraesophageal hernia present. The visualized portions of the lungs are clear. Hepatobiliary: Small low-density lesions are again seen throughout the liver parenchyma the largest within the anterior left liver lobe measuring 1.5 cm.The main portal vein is patent. No evidence of calcified gallstones, gallbladder wall thickening or biliary dilatation. Pancreas: Unremarkable. No pancreatic ductal dilatation or surrounding inflammatory changes. Spleen: Normal in size without focal abnormality. Adrenals/Urinary Tract: Both adrenal glands appear normal. The kidneys and collecting system appear normal without evidence of urinary tract calculus or hydronephrosis. Bladder is unremarkable. Stomach/Bowel: There appears to be mildly dilated fluid-filled loops of small bowel within the anterior lower abdomen with adjacent mild mesenteric  edema. However no clear transition point is seen. The distal ileal loops appear to be decompressed. The cecum appears to be mildly distended with a large amount of colonic stool present. No focal wall thickening is noted. Again noted is a surgical anastomosis in the D pelvis.The appendix is normal. Vascular/Lymphatic: There are no enlarged mesenteric, retroperitoneal, or pelvic lymph nodes. Scattered aortic atherosclerotic calcifications are seen without aneurysmal dilatation. Reproductive: The patient is status post hysterectomy. No adnexal masses or collections seen. Other: No evidence of abdominal wall mass or hernia. Musculoskeletal: No acute or significant osseous findings. IMPRESSION: Mildly dilated fluid-filled loops of small bowel within the lower abdomen with adjacent mesenteric edema.  However no clear transition point is seen. This could be due to early partial small bowel obstruction. Moderate to large amount of fecal colonic stool. Aortic Atherosclerosis (ICD10-I70.0). Electronically Signed   By: Prudencio Pair M.D.   On: 06/28/2019 05:12   DG ABD ACUTE 2+V W 1V CHEST  Result Date: 06/28/2019 CLINICAL DATA:  Nausea and vomiting EXAM: DG ABDOMEN ACUTE W/ 1V CHEST COMPARISON:  CT abdomen and pelvis June 28, 2019; chest radiograph August 23, 2017 FINDINGS: PA chest: No edema or airspace opacity. Heart is upper normal in size with pulmonary vascularity normal. Aorta is tortuous, stable. No adenopathy. Supine and upright abdomen: There is moderate stool in the colon. There is no bowel dilatation or air-fluid level to suggest bowel obstruction. No free air. There are clips in the pelvic region. Contrast is present in the urinary bladder, an apparent result of intravenous contrast administration for CT earlier in the day. IMPRESSION: Moderate stool in colon. No bowel obstruction or free air evident by radiography. Lungs clear.  Stable cardiac silhouette. Electronically Signed   By: Lowella Grip III M.D.    On: 06/28/2019 09:22    ____________________________________________   PROCEDURES   Procedure(s) performed (including Critical Care):  Procedures   ____________________________________________   INITIAL IMPRESSION / MDM / [place] / ED COURSE  As part of my medical decision making, I reviewed the following data within the electronic MEDICAL RECORD NUMBER  86 year old female presented with above-stated history and physical exam a differential diagnosis including but not limited to bowel obstruction, appendicitis diverticulitis colitis gastroenteritis, pancreatitis.  Patient was given IV morphine and Zofran with improvement of pain however no resolution as such medications were readministered.  I reviewed the patient's CT scan which has evidence of a possible partial small bowel obstruction.  Patient's care was transferred to Dr. Corky Downs  ____________________________________________  FINAL CLINICAL IMPRESSION(S) / ED DIAGNOSES  Final diagnoses:  Abdominal pain  Small bowel obstruction ([place])     MEDICATIONS GIVEN DURING THIS VISIT:  Medications  ondansetron (ZOFRAN) injection 4 mg (has no administration in time range)  acetaminophen (TYLENOL) tablet 650 mg (650 mg Oral Given 06/28/19 2123)  hydrALAZINE (APRESOLINE) injection 5 mg (5 mg Intravenous Given 06/28/19 0918)  0.9 %  sodium chloride infusion ( Intravenous New Bag/Given 06/28/19 2124)  albuterol (PROVENTIL) (2.5 MG/3ML) 0.083% nebulizer solution 2.5 mg (has no administration in time range)  aspirin EC tablet 81 mg (81 mg Oral Not Given 06/28/19 1700)  isosorbide mononitrate (IMDUR) 24 hr tablet 30 mg (30 mg Oral Not Given 06/28/19 1700)  metoprolol succinate (TOPROL-XL) 24 hr tablet 25 mg (25 mg Oral Not Given 06/28/19 1700)  rosuvastatin (CRESTOR) tablet 5 mg (5 mg Oral Not Given 06/28/19 1700)  sacubitril-valsartan (ENTRESTO) 97-103 mg per tablet (1 tablet Oral Given 06/28/19 2222)  cholecalciferol (VITAMIN D3) tablet 2,000  Units (2,000 Units Oral Not Given 06/28/19 1700)  morphine 2 MG/ML injection 1 mg (1 mg Intravenous Given 06/28/19 1923)  dicyclomine (BENTYL) capsule 10 mg (10 mg Oral Given 06/28/19 2222)  famotidine (PEPCID) IVPB 20 mg premix (20 mg Intravenous New Bag/Given 06/28/19 2121)  ondansetron (ZOFRAN) injection 4 mg (4 mg Intravenous Given 06/28/19 0333)  morphine 2 MG/ML injection 2 mg (2 mg Intravenous Given 06/28/19 0333)  iohexol (OMNIPAQUE) 300 MG/ML solution 100 mL (100 mLs Intravenous Contrast Given 06/28/19 0445)  morphine 4 MG/ML injection 4 mg (4 mg Intravenous Given 06/28/19 0556)     ED Discharge Orders  None      *Please note:  Shawonna Drenner was evaluated in Emergency Department on 06/28/2019 for the symptoms described in the history of present illness. She was evaluated in the context of the global COVID-19 pandemic, which necessitated consideration that the patient might be at risk for infection with the SARS-CoV-2 virus that causes COVID-19. Institutional protocols and algorithms that pertain to the evaluation of patients at risk for COVID-19 are in a state of rapid change based on information released by regulatory bodies including the CDC and federal and state organizations. These policies and algorithms were followed during the patient's care in the ED.  Some ED evaluations and interventions may be delayed as a result of limited staffing during the pandemic.*  Note:  This document was prepared using Dragon voice recognition software and may include unintentional dictation errors.   Gregor Hams, MD 06/28/19 [phone]

## 2019-06-28 NOTE — ED Notes (Signed)
Assisted patient to bathroom. Patient able to clean self with no assistance. Patient daughter Lattie Haw at bedside.

## 2019-06-28 NOTE — ED Notes (Signed)
Pt sitting in lobby with daughter with no distress; pt reports relief of nausea but abd pain persists at 10/10; MD aware

## 2019-06-28 NOTE — ED Triage Notes (Addendum)
Pt to triage via w/c, actively vomiting; st awoke around midnight with upper abd pain accomp by nausea; denies any urinary or bowel c/o; denies hx of same; abd tender to upper quad with palpation

## 2019-06-28 NOTE — H&P (Addendum)
History and Physical    Kathy Dominguez [id number] DOB: [birth year] DOA: 06/28/2019  Referring MD/NP/PA:   PCP: Einar Pheasant, MD   Patient coming from:  The patient is coming from home.  At baseline, pt is independent for most of ADL.        Chief Complaint: Abdominal pain, nausea, vomiting  HPI: Kathy Dominguez is a 86 y.o. female with medical history significant of hypertension, hyperlipidemia, colon cancer, CKD-3, CHF, NSVT, SBO, who presents with nausea vomiting, abdominal pain.  Her symptoms started last night, including nausea, vomiting and abdominal pain. She has nausea, dry heaves and one episode of nonbloody nonbilious emesis.  Her abdominal pain is located in the upper abdomen, constant, sharp, 10 out of 10 severity, nonradiating.  Last bowel movement was yesterday morning.  No diarrhea.  She has some mild dry cough, denies chest pain, shortness of breath, fever or chills.  No symptoms of UTI or unilateral weakness.  ED Course: pt was found to have WBC 5.2, troponin <2, lipase of 57, pending UA, pending covid19 PCR, renal function stable, liver function okay, temperature 97.5, blood pressure 177/91, heart rate 59-77, RR 16, oxygen saturation 95% on room air.  CT of abdomen/pelvis that showed possible early stage of partial small bowel obstruction.  Patient is admitted to progressive bed as inpatient.  General surgeon is consulted.  Review of Systems:   General: no fevers, chills, no body weight gain, has poor appetite, has fatigue HEENT: no blurry vision, hearing changes or sore throat Respiratory: no dyspnea, has coughing, no wheezing CV: no chest pain, no palpitations GI: has nausea, vomiting, abdominal pain, no diarrhea, constipation GU: no dysuria, burning on urination, increased urinary frequency, hematuria  Ext: no leg edema Neuro: no unilateral weakness, numbness, or tingling, no vision change or hearing loss Skin: no rash, no skin tear. MSK: No muscle spasm, no  deformity, no limitation of range of movement in spin Heme: No easy bruising.  Travel history: No recent long distant travel.  Allergy: No Known Allergies  Past Medical History:  Diagnosis Date  . Asthma   . Chronic systolic CHF (congestive heart failure) ([place])    a. 01/2015 Echo: EF 25-30%, sev diff HK, mild to mod MR, mildly dil LA, nl RV fxn, PASP 61 mmHg.  . Colon cancer ([place]) 2005  . Hypercholesterolemia   . Hypertension   . Hypertensive heart disease with CHF (congestive heart failure) ([place])   . NICM (nonischemic cardiomyopathy) ([place])    a. 01/2015 Echo: EF 25-30% sev diff HK;  b. 01/2015 Lexi MV: EF 19%, small defect of mild severity in apex - likely breast attenuation, no ischemia.  Marland Kitchen NSVT (nonsustained ventricular tachycardia) ([place])   . Osteoporosis     Past Surgical History:  Procedure Laterality Date  . ABDOMINAL HYSTERECTOMY    . BREAST BIOPSY Left 4/15`   BENIGN BREAST EPITHELIUM WITH NODULAR FIBROSIS.  Marland Kitchen INGUINAL HERNIA REPAIR Left 05/04/2016   Procedure: HERNIA REPAIR INGUINAL ADULT;  Surgeon: Robert Bellow, MD;  Location: [facility] ORS;  Service: General;  Laterality: Left;  . THYROIDECTOMY, PARTIAL  1956  . TUBAL LIGATION      Social History:  reports that she has never smoked. She has never used smokeless tobacco. She reports that she does not drink alcohol or use drugs.  Family History:  Family History  Problem Relation Age of Onset  . Stroke Mother   . Colon cancer Father   . Breast cancer Daughter 5  . Breast  cancer Cousin      Prior to Admission medications   Medication Sig Start Date End Date Taking? Authorizing Provider  acetaminophen (TYLENOL) 325 MG tablet Take 650 mg by mouth every 6 (six) hours as needed.    [provider]  albuterol (VENTOLIN HFA) 108 (90 Base) MCG/ACT inhaler Inhale 2 puffs into the lungs every 6 (six) hours as needed for wheezing or shortness of breath. 04/05/19   Einar Pheasant, MD  aspirin (ASPIRIN EC) 81 MG EC  tablet Take 81 mg by mouth daily. Swallow whole.    [provider]  Cholecalciferol (VITAMIN D-3) 1000 UNITS CAPS Take 2,000 Units by mouth daily.    [provider]  furosemide (LASIX) 40 MG tablet Take 1/2 tablet-1 tablet as directed per weight gain. 06/26/19   Minna Merritts, MD  isosorbide mononitrate (IMDUR) 30 MG 24 hr tablet Take 1 tablet (30 mg total) by mouth daily. 05/17/19   Minna Merritts, MD  metoprolol succinate (TOPROL-XL) 25 MG 24 hr tablet Take 1 tablet (25 mg total) by mouth daily. 03/28/19   Minna Merritts, MD  rosuvastatin (CRESTOR) 5 MG tablet Take 1 tablet (5 mg total) by mouth daily. 03/28/19   Minna Merritts, MD  sacubitril-valsartan (ENTRESTO) 97-103 MG Take 1 tablet by mouth 2 (two) times daily. 03/28/19   Minna Merritts, MD  spironolactone (ALDACTONE) 25 MG tablet Take 1 tablet (25 mg total) by mouth daily. 03/28/19   Minna Merritts, MD    Physical Exam: Vitals:   06/28/19 0316 06/28/19 0317 06/28/19 0555 06/28/19 0659  BP: (!) 181/96  (!) 170/75 (!) 177/91  Pulse: 77  (!) 58 (!) 59  Resp: 18  18 16   Temp: 97.6 F (36.4 C)   (!) 97.5 F (36.4 C)  TempSrc: Oral   Oral  SpO2: 95%  95% 98%  Weight:  59 kg    Height:  5\' 2"  (1.575 m)     General: Not in acute distress HEENT:       Eyes: PERRL, EOMI, no scleral icterus.       ENT: No discharge from the ears and nose, no pharynx injection, no tonsillar enlargement.        Neck: No JVD, no bruit, no mass felt. Heme: No neck lymph node enlargement. Cardiac: S1/S2, RRR, No murmurs, No gallops or rubs. Respiratory:  No rales, wheezing, rhonchi or rubs. GI: Soft, mildly distended, has tenderness in upper abdomen, no rebound pain, no organomegaly, BS present. GU: No hematuria Ext: No pitting leg edema bilaterally. 2+DP/PT pulse bilaterally. Musculoskeletal: No joint deformities, No joint redness or warmth, no limitation of ROM in spin. Skin: No rashes.  Neuro: Alert, oriented X3, cranial  nerves II-XII grossly intact, moves all extremities normally.  Psych: Patient is not psychotic, no suicidal or hemocidal ideation.  Labs on Admission: I have personally reviewed following labs and imaging studies  CBC: Recent Labs  Lab 06/28/19 0341  WBC 5.2  NEUTROABS 3.2  HGB 11.4*  HCT 35.8*  MCV 93.5  PLT 0000000   Basic Metabolic Panel: Recent Labs  Lab 06/28/19 0341  NA 140  K 4.0  CL 102  CO2 27  GLUCOSE 142*  BUN 29*  CREATININE 1.13*  CALCIUM 9.5   GFR: Estimated Creatinine Clearance: 26.7 mL/min (A) (by C-G formula based on SCr of 1.13 mg/dL (H)). Liver Function Tests: Recent Labs  Lab 06/28/19 0341  AST 21  ALT 13  ALKPHOS 49  BILITOT  0.7  PROT 7.3  ALBUMIN 4.3   Recent Labs  Lab 06/28/19 0341  LIPASE 57*   No results for input(s): AMMONIA in the last 168 hours. Coagulation Profile: No results for input(s): INR, PROTIME in the last 168 hours. Cardiac Enzymes: No results for input(s): CKTOTAL, CKMB, CKMBINDEX, TROPONINI in the last 168 hours. BNP (last 3 results) No results for input(s): PROBNP in the last 8760 hours. HbA1C: No results for input(s): HGBA1C in the last 72 hours. CBG: No results for input(s): GLUCAP in the last 168 hours. Lipid Profile: No results for input(s): CHOL, HDL, LDLCALC, TRIG, CHOLHDL, LDLDIRECT in the last 72 hours. Thyroid Function Tests: No results for input(s): TSH, T4TOTAL, FREET4, T3FREE, THYROIDAB in the last 72 hours. Anemia Panel: No results for input(s): VITAMINB12, FOLATE, FERRITIN, TIBC, IRON, RETICCTPCT in the last 72 hours. Urine analysis:    Component Value Date/Time   COLORURINE YELLOW (A) 05/02/2016 1943   APPEARANCEUR CLEAR (A) 05/02/2016 1943   LABSPEC 1.014 05/02/2016 1943   PHURINE 7.0 05/02/2016 1943   GLUCOSEU NEGATIVE 05/02/2016 [place] NEGATIVE 02/20/2015 0945   HGBUR NEGATIVE 05/02/2016 [place] 05/02/2016 [place] 05/02/2016 1943   PROTEINUR  NEGATIVE 05/02/2016 1943   UROBILINOGEN 0.2 02/20/2015 0945   NITRITE NEGATIVE 05/02/2016 1943   LEUKOCYTESUR TRACE (A) 05/02/2016 1943   Sepsis Labs: @LABRCNTIP (procalcitonin:4,lacticidven:4) )No results found for this or any previous visit (from the past 240 hour(s)).   Radiological Exams on Admission: CT ABDOMEN PELVIS W CONTRAST  Result Date: 06/28/2019 CLINICAL DATA:  Nausea abdominal pain distension EXAM: CT ABDOMEN AND PELVIS WITH CONTRAST TECHNIQUE: Multidetector CT imaging of the abdomen and pelvis was performed using the standard protocol following bolus administration of intravenous contrast. CONTRAST:  116mL OMNIPAQUE IOHEXOL 300 MG/ML  SOLN COMPARISON:  May 03, 2016 FINDINGS: Lower chest: The visualized heart size within normal limits. No pericardial fluid/thickening. There is a small paraesophageal hernia present. The visualized portions of the lungs are clear. Hepatobiliary: Small low-density lesions are again seen throughout the liver parenchyma the largest within the anterior left liver lobe measuring 1.5 cm.The main portal vein is patent. No evidence of calcified gallstones, gallbladder wall thickening or biliary dilatation. Pancreas: Unremarkable. No pancreatic ductal dilatation or surrounding inflammatory changes. Spleen: Normal in size without focal abnormality. Adrenals/Urinary Tract: Both adrenal glands appear normal. The kidneys and collecting system appear normal without evidence of urinary tract calculus or hydronephrosis. Bladder is unremarkable. Stomach/Bowel: There appears to be mildly dilated fluid-filled loops of small bowel within the anterior lower abdomen with adjacent mild mesenteric edema. However no clear transition point is seen. The distal ileal loops appear to be decompressed. The cecum appears to be mildly distended with a large amount of colonic stool present. No focal wall thickening is noted. Again noted is a surgical anastomosis in the D pelvis.The appendix is  normal. Vascular/Lymphatic: There are no enlarged mesenteric, retroperitoneal, or pelvic lymph nodes. Scattered aortic atherosclerotic calcifications are seen without aneurysmal dilatation. Reproductive: The patient is status post hysterectomy. No adnexal masses or collections seen. Other: No evidence of abdominal wall mass or hernia. Musculoskeletal: No acute or significant osseous findings. IMPRESSION: Mildly dilated fluid-filled loops of small bowel within the lower abdomen with adjacent mesenteric edema. However no clear transition point is seen. This could be due to early partial small bowel obstruction. Moderate to large amount of fecal colonic stool. Aortic Atherosclerosis (ICD10-I70.0). Electronically Signed   By: Ebony Cargo.D.  On: 06/28/2019 05:12     EKG: Independently reviewed.  Sinus rhythm, QTC 461, LAE, LAD, nonspecific T wave change  Assessment/Plan Principal Problem:   Partial small bowel obstruction (HCC) Active Problems:   Essential hypertension   Hypercholesterolemia   NSVT (nonsustained ventricular tachycardia) (HCC)   Chronic systolic heart failure (HCC)   CKD (chronic kidney disease), stage IIIa   Partial small bowel obstruction ([place]): Patient's nausea, vomiting and abdominal pain is most likely due to partial small bowel obstruction as evidenced by CT scan.  General surgeon, Dr. Bary Castilla is consulted.  -Admit to med surg bed as inpt -NPO   -prn NG tube (not started yet) -morphine prn pain -Prn Zofran prn nausea   -IVF: NS 75 cc/h -INR/PTT/type & screen -Follow-up general surgeons recommendation  Essential hypertension:  -continue metoprolol, Entresto -IV hydralazine as needed  Hypercholesterolemia: -pt is on Crestor at home  NSVT (nonsustained ventricular tachycardia) ([place]): HR 59-77  -pt is on metoprolol 25 mg daily at home  Chronic systolic heart failure [facility]): Patient had 2D echo on 01/29/2016 which showed EF 25-30%, but the repeated 2D echo on  09/28/2016 showed EF 50-55% with grade 1 diastolic dysfunction. No leg edema or SOB. CHF seems to be compensated. -Hold Lasix and spironolactone while patient is on n.p.o. -continue entresto  -Check BNP  CKD-IIIa: stable -f/u by BMP  Addendum: Urinalysis showed cloudy appearance, large amount of leukocyte, few bacteria, WBC 21-50, but has squamous cell contamination 11-20.  Patient is asymptomatic for UTI (no dysuria, burning on urination or increased frequency). -will hold off antibiotics now -Follow-up urine culture     DVT ppx: SCD Code Status: Full code Family Communication:  Yes, patient's daughter   at bed side Disposition Plan:  Anticipate discharge back to previous environment Consults called: Education officer, environmental, Dr. Bary Castilla Admission status: Med-surg bed as inpt      Status is: Inpatient  Remains inpatient appropriate because:Inpatient level of care appropriate due to severity of illness.  Patient has multiple comorbidities, now presents with nausea vomiting, abdominal pain, CT scan showed possible partial small bowel obstruction.  Given her old age, and history of small bowel obstruction, patient is at high risk of deteriorating.  If she deteriorates, may need surgery.  Patient will need to be treated in hospital for at least 2 days.  Dispo: The patient is from: Home              Anticipated d/c is to: Home              Anticipated d/c date is: 2 days              Patient currently is not medically stable to d/c.       Date of Service 06/28/2019    Ivor Costa [facility]   If 7PM-7AM, please contact night-coverage [email or web address] 06/28/2019, 8:11 AM

## 2019-06-28 NOTE — Progress Notes (Signed)
Slept this afternoon.  Small emesis. Reports cramping pain returned. Last BM yesterday AM by report.  Denies flatus today. Plain films unremarkable. (Consistent with minimal distension on CT). Exam: Lungs: Clear. Cardio: RR. ABD: Non-distended, soft. BS+.  No focal tenderness. CT shows no evidence of recurrent left inguinal hernia (post repair) or new abdominal wall hernia. IMP: Partial SBO.  Scant gastric bubble, minimal emesis. Would not place NG at this time. Plan: IV Pepcid, po Bentyl. Repeat films in AM.

## 2019-06-28 NOTE — Consult Note (Signed)
Reason for Consult:Abdominal pain, possible small bowel obstruction  Referring Physician: Ivor Costa, MD  Kathy Dominguez is an 86 y.o. female.  HPI: The patient reports she was well when she retired for the evening about 10 PM last night.  She was awakened from sleep with severe abdominal pain followed by several episodes of vomiting.  Nausea resolved with vomiting, but not the pain.  Presented to the ED in the early morning hours. Laboratory studies were obtained as well as a CT of the abdomen.  In 2018 the patient had presented to the emergency department with left lower quadrant pain on 2 separate occasions as well as evidence of a partial small bowel obstruction.  At that time, she had a left inguinal hernia that was reduced and subsequently repaired.  The patient had no change in bowel function last several weeks   The patient is accompanied by her daughter, Khiyah Maben.   Past Medical History:  Diagnosis Date  . Asthma   . Chronic systolic CHF (congestive heart failure) ([place])    a. 01/2015 Echo: EF 25-30%, sev diff HK, mild to mod MR, mildly dil LA, nl RV fxn, PASP 61 mmHg.  . Colon cancer ([place]) 2005  . Hypercholesterolemia   . Hypertension   . Hypertensive heart disease with CHF (congestive heart failure) ([place])   . NICM (nonischemic cardiomyopathy) ([place])    a. 01/2015 Echo: EF 25-30% sev diff HK;  b. 01/2015 [facility]i MV: EF 19%, small defect of mild severity in apex - likely breast attenuation, no ischemia.  Marland Kitchen NSVT (nonsustained ventricular tachycardia) ([place])   . Osteoporosis     Past Surgical History:  Procedure Laterality Date  . ABDOMINAL HYSTERECTOMY    . BREAST BIOPSY Left 4/15`   BENIGN BREAST EPITHELIUM WITH NODULAR FIBROSIS.  Marland Kitchen INGUINAL HERNIA REPAIR Left 05/04/2016   Procedure: HERNIA REPAIR INGUINAL ADULT;  Surgeon: Robert Bellow, MD;  Location: [facility] ORS;  Service: General;  Laterality: Left;  . THYROIDECTOMY, PARTIAL  1956  . TUBAL LIGATION      Family History   Problem Relation Age of Onset  . Stroke Mother   . Colon cancer Father   . Breast cancer Daughter 3  . Breast cancer Cousin     Social History:  reports that she has never smoked. She has never used smokeless tobacco. She reports that she does not drink alcohol or use drugs.  Allergies: No Known Allergies  Medications: I have reviewed the patient's current medications.  Results for orders placed or performed during the hospital encounter of 06/28/19 (from the past 48 hour(s))  CBC with Differential     Status: Abnormal   Collection Time: 06/28/19  3:41 AM  Result Value Ref Range   WBC 5.2 4.0 - 10.5 K/uL   RBC 3.83 (L) 3.87 - 5.11 MIL/uL   Hemoglobin 11.4 (L) 12.0 - 15.0 g/dL   HCT 35.8 (L) 36.0 - 46.0 %   MCV 93.5 80.0 - 100.0 fL   MCH 29.8 26.0 - 34.0 pg   MCHC 31.8 30.0 - 36.0 g/dL   RDW 12.2 11.5 - 15.5 %   Platelets 195 150 - 400 K/uL   nRBC 0.0 0.0 - 0.2 %   Neutrophils Relative % 62 %   Neutro Abs 3.2 1.7 - 7.7 K/uL   Lymphocytes Relative 31 %   Lymphs Abs 1.6 0.7 - 4.0 K/uL   Monocytes Relative 7 %   Monocytes Absolute 0.4 0.1 - 1.0 K/uL   Eosinophils  Relative 0 %   Eosinophils Absolute 0.0 0.0 - 0.5 K/uL   Basophils Relative 0 %   Basophils Absolute 0.0 0.0 - 0.1 K/uL   Immature Granulocytes 0 %   Abs Immature Granulocytes 0.01 0.00 - 0.07 K/uL    Comment: Performed at [facility], [address], [place], [place] [address]  Comprehensive metabolic panel     Status: Abnormal   Collection Time: 06/28/19  3:41 AM  Result Value Ref Range   Sodium 140 135 - 145 mmol/L   Potassium 4.0 3.5 - 5.1 mmol/L   Chloride 102 98 - 111 mmol/L   CO2 27 22 - 32 mmol/L   Glucose, Bld 142 (H) 70 - 99 mg/dL    Comment: Glucose reference range applies only to samples taken after fasting for at least 8 hours.   BUN 29 (H) 8 - 23 mg/dL   Creatinine, Ser 1.13 (H) 0.44 - 1.00 mg/dL   Calcium 9.5 8.9 - 10.3 mg/dL   Total Protein 7.3 6.5 - 8.1 g/dL   Albumin 4.3 3.5 -  5.0 g/dL   AST 21 15 - 41 U/L   ALT 13 0 - 44 U/L   Alkaline Phosphatase 49 38 - 126 U/L   Total Bilirubin 0.7 0.3 - 1.2 mg/dL   GFR calc non Af Amer 43 (L) >60 mL/min   GFR calc Af Amer 50 (L) >60 mL/min   Anion gap 11 5 - 15    Comment: Performed at [facility], [address] [place], [place] [address]  Lipase, blood     Status: Abnormal   Collection Time: 06/28/19  3:41 AM  Result Value Ref Range   Lipase 57 (H) 11 - 51 U/L    Comment: Performed at [facility], [place], [place] [address]  Troponin I (High Sensitivity)     Status: None   Collection Time: 06/28/19  3:41 AM  Result Value Ref Range   Troponin I (High Sensitivity) <2 <18 ng/L    Comment: (NOTE) Elevated high sensitivity troponin I (hsTnI) values and significant  changes across serial measurements may suggest ACS but many other  chronic and acute conditions are known to elevate hsTnI results.  Refer to the "Links" section for chest pain algorithms and additional  guidance. Performed at [facility], [address] [place], [place] [address]   Brain natriuretic peptide     Status: None   Collection Time: 06/28/19  3:41 AM  Result Value Ref Range   B Natriuretic Peptide 45.6 0.0 - 100.0 pg/mL    Comment: Performed at [facility], [address] [place], [place] [address]  Urinalysis, Complete w Microscopic     Status: Abnormal   Collection Time: 06/28/19  8:13 AM  Result Value Ref Range   Color, Urine YELLOW (A) YELLOW   APPearance CLOUDY (A) CLEAR   Specific Gravity, Urine 1.041 (H) 1.005 - 1.030   pH 6.0 5.0 - 8.0   Glucose, UA NEGATIVE NEGATIVE mg/dL   Hgb urine dipstick NEGATIVE NEGATIVE   Bilirubin Urine NEGATIVE NEGATIVE   Ketones, ur 5 (A) NEGATIVE mg/dL   Protein, ur NEGATIVE NEGATIVE mg/dL   Nitrite NEGATIVE NEGATIVE   Leukocytes,Ua LARGE (A) NEGATIVE   RBC / HPF 6-10 0 - 5 RBC/hpf   WBC, UA 21-50 0 - 5 WBC/hpf   Bacteria, UA FEW (A)  NONE SEEN   Squamous Epithelial / LPF 11-20 0 - 5   Mucus  PRESENT    Non Squamous Epithelial PRESENT (A) NONE SEEN    Comment: Performed at [facility], [address] [place], [place] [address]    CT ABDOMEN PELVIS W CONTRAST  Result Date: 06/28/2019 CLINICAL DATA:  Nausea abdominal pain distension EXAM: CT ABDOMEN AND PELVIS WITH CONTRAST TECHNIQUE: Multidetector CT imaging of the abdomen and pelvis was performed using the standard protocol following bolus administration of intravenous contrast. CONTRAST:  130mL OMNIPAQUE IOHEXOL 300 MG/ML  SOLN COMPARISON:  May 03, 2016 FINDINGS: Lower chest: The visualized heart size within normal limits. No pericardial fluid/thickening. There is a small paraesophageal hernia present. The visualized portions of the lungs are clear. Hepatobiliary: Small low-density lesions are again seen throughout the liver parenchyma the largest within the anterior left liver lobe measuring 1.5 cm.The main portal vein is patent. No evidence of calcified gallstones, gallbladder wall thickening or biliary dilatation. Pancreas: Unremarkable. No pancreatic ductal dilatation or surrounding inflammatory changes. Spleen: Normal in size without focal abnormality. Adrenals/Urinary Tract: Both adrenal glands appear normal. The kidneys and collecting system appear normal without evidence of urinary tract calculus or hydronephrosis. Bladder is unremarkable. Stomach/Bowel: There appears to be mildly dilated fluid-filled loops of small bowel within the anterior lower abdomen with adjacent mild mesenteric edema. However no clear transition point is seen. The distal ileal loops appear to be decompressed. The cecum appears to be mildly distended with a large amount of colonic stool present. No focal wall thickening is noted. Again noted is a surgical anastomosis in the D pelvis.The appendix is normal. Vascular/Lymphatic: There are no enlarged mesenteric, retroperitoneal, or pelvic lymph  nodes. Scattered aortic atherosclerotic calcifications are seen without aneurysmal dilatation. Reproductive: The patient is status post hysterectomy. No adnexal masses or collections seen. Other: No evidence of abdominal wall mass or hernia. Musculoskeletal: No acute or significant osseous findings. IMPRESSION: Mildly dilated fluid-filled loops of small bowel within the lower abdomen with adjacent mesenteric edema. However no clear transition point is seen. This could be due to early partial small bowel obstruction. Moderate to large amount of fecal colonic stool. Aortic Atherosclerosis (ICD10-I70.0). Electronically Signed   By: Prudencio Pair M.D.   On: 06/28/2019 05:12    Review of Systems  Constitutional: Negative.   HENT: Negative.   Eyes: Negative.   Respiratory: Negative.   Cardiovascular: Negative.   Gastrointestinal: Positive for abdominal distention, nausea and vomiting.  Endocrine: Negative.   Genitourinary: Negative.   Skin: Negative.   Allergic/Immunologic: Negative.   Neurological: Negative.   Hematological: Negative.   Psychiatric/Behavioral: Negative.    Blood pressure (!) 177/91, pulse (!) 59, temperature (!) 97.5 F (36.4 C), temperature source Oral, resp. rate 16, height 5\' 2"  (1.575 m), weight 59 kg, last menstrual period 02/21/1966, SpO2 98 %. Physical Exam  Constitutional: She is oriented to person, place, and time. She appears well-developed and well-nourished.  HENT:  Head: Normocephalic.  Eyes: No scleral icterus.  Neck: No thyromegaly present.  Cardiovascular: Normal rate and regular rhythm.  Respiratory: Effort normal and breath sounds normal.  GI: Soft. Bowel sounds are normal. She exhibits distension (Mild). She exhibits no mass. There is no abdominal tenderness. There is no rebound and no guarding.  Musculoskeletal:        General: Normal range of motion.     Cervical back: Normal range of motion and neck supple.  Neurological: She is alert and oriented to  person, place, and time. She has normal reflexes.  Skin: Skin is warm and dry.  Psychiatric: She has a normal mood and affect. Her behavior is normal. Judgment and thought content normal.    Assessment The CT scan of earlier this morning was independently reviewed and the radiologist report reviewed.  The area of the left inguinal area shows a small collection likely related to the Bard plug and patch used for her hernia repair.  No evidence of recurrent hernia.  No contralateral hernia.  No ventral hernia.  No free fluid. Maximum small bowel diameter is less than 4 cm.   Laboratory reviewed showed evidence of dehydration with a specific gravity of 1.041.  Large leukocytes, few bacteria on urinalysis.  21-50 WBC.  Comprehensive metabolic panel shows mildly elevated blood sugar 142, estimated GFR 50, creatinine 1.13.  Normal electrolytes.  Lipase with scant elevation at 57.  CBC shows the hemoglobin somewhat down at 11.4 with an MCV of 93.5, white blood cell count of 5200 with normal differential.  1 year ago hemoglobin was 12.9 and 2 years ago 13.2.   Plan: The patient 's exam is fairly benign with pain out of proportion to physical findings.  The possible of an interloop obstruction is present, although the prominent vomiting present earlier in the day could be related to a simple adhesive band with early small bowel obstruction.    We will obtain plain films for baseline and repeat an exam later in the day.  Plan on conservative management with IV fluids at this time.  Urine culture would be appropriate.  Forest Gleason Dannia Snook 06/28/2019, 8:57 AM

## 2019-06-28 NOTE — ED Provider Notes (Signed)
CT scan demonstrates partial small bowel obstruction, admitted to the hospital service   Lavonia Drafts, MD 06/28/19 [phone]

## 2019-06-29 ENCOUNTER — Inpatient Hospital Stay: Payer: Medicare HMO

## 2019-06-29 DIAGNOSIS — K56600 Partial intestinal obstruction, unspecified as to cause: Secondary | ICD-10-CM | POA: Diagnosis not present

## 2019-06-29 DIAGNOSIS — I10 Essential (primary) hypertension: Secondary | ICD-10-CM | POA: Diagnosis not present

## 2019-06-29 DIAGNOSIS — I5022 Chronic systolic (congestive) heart failure: Secondary | ICD-10-CM | POA: Diagnosis not present

## 2019-06-29 LAB — URINE CULTURE

## 2019-06-29 LAB — BASIC METABOLIC PANEL
Anion gap: 9 (ref 5–15)
BUN: 29 mg/dL — ABNORMAL HIGH (ref 8–23)
CO2: 28 mmol/L (ref 22–32)
Calcium: 9 mg/dL (ref 8.9–10.3)
Chloride: 104 mmol/L (ref 98–111)
Creatinine, Ser: 0.99 mg/dL (ref 0.44–1.00)
GFR calc Af Amer: 59 mL/min — ABNORMAL LOW (ref >60)
GFR calc non Af Amer: 51 mL/min — ABNORMAL LOW (ref >60)
Glucose, Bld: 106 mg/dL — ABNORMAL HIGH (ref 70–99)
Potassium: 4.3 mmol/L (ref 3.5–5.1)
Sodium: 141 mmol/L (ref 135–145)

## 2019-06-29 LAB — CBC
HCT: 38.8 % (ref 36.0–46.0)
Hemoglobin: 12.6 g/dL (ref 12.0–15.0)
MCH: 29.6 pg (ref 26.0–34.0)
MCHC: 32.5 g/dL (ref 30.0–36.0)
MCV: 91.3 fL (ref 80.0–100.0)
Platelets: 214 10*3/uL (ref 150–400)
RBC: 4.25 MIL/uL (ref 3.87–5.11)
RDW: 12.5 % (ref 11.5–15.5)
WBC: 6.9 10*3/uL (ref 4.0–10.5)
nRBC: 0 % (ref 0.0–0.2)

## 2019-06-29 MED ORDER — LACTULOSE 10 GM/15ML PO SOLN
30.0000 g | Freq: Once | ORAL | Status: DC
  Filled 2019-06-29: qty 60

## 2019-06-29 MED ORDER — SENNOSIDES-DOCUSATE SODIUM 8.6-50 MG PO TABS: 2.0000 | ORAL_TABLET | Freq: Two times a day (BID) | ORAL | 0 refills | Status: AC | PRN

## 2019-06-29 MED ORDER — ENOXAPARIN SODIUM 40 MG/0.4ML ~~LOC~~ SOLN: 40.0000 mg | SUBCUTANEOUS | Status: DC

## 2019-06-29 MED ORDER — BISACODYL 10 MG RE SUPP
10.0000 mg | Freq: Once | RECTAL | Status: AC
  Administered 2019-06-29: 09:00:00 10 mg via RECTAL
  Filled 2019-06-29: qty 1

## 2019-06-29 MED ORDER — FAMOTIDINE IN NACL 20-0.9 MG/50ML-% IV SOLN: 20.0000 mg | INTRAVENOUS | Status: DC

## 2019-06-29 NOTE — Care Management Obs Status (Signed)
[facility] NOTIFICATION   Patient Details  Name: Emmalyne Giacomo MRN: [id number] Date of Birth: 01/04/1929   Medicare Observation Status Notification Given:  Yes    Shelbie Hutching, RN 06/29/2019, 2:29 PM

## 2019-06-29 NOTE — Care Management CC44 (Signed)
Condition Code 44 Documentation Completed  Patient Details  Name: Kathy Dominguez MRN: [id number] Date of Birth: 02-May-1928   Condition Code 44 given:  Yes Patient signature on Condition Code 44 notice:  Yes Documentation of 2 MD's agreement:  Yes Code 44 added to claim:  Yes    Shelbie Hutching, RN 06/29/2019, 2:29 PM

## 2019-06-29 NOTE — Progress Notes (Signed)
AVSS.  Pain free since MN.  Denies BM/ Flatus since admission.  No further nausea. ABD: Soft, non-tender. Labs reviewed: HGB back to baseline, normal WBC. Electrolytes are fine. Plain films. Gas to splenic flexure. Will past history, will request a SBFT to assess for any adhesive bands.

## 2019-06-29 NOTE — Discharge Instructions (Signed)
1.  Resume normal diet.  Increase fruit and vegetable intake. 2.  Follow-up with PCP in 1 week.

## 2019-06-29 NOTE — Discharge Summary (Signed)
Physician Discharge Summary  [name] MRN: [id number] DOB/AGE: [birth year] 86 y.o.  Admit date: 06/28/2019 Discharge date: 06/29/2019  Admission Diagnoses:  Discharge Diagnoses:  Principal Problem:   Partial small bowel obstruction ([place]) Active Problems:   Essential hypertension   Hypercholesterolemia   NSVT (nonsustained ventricular tachycardia) ([facility])   Chronic systolic heart failure ([facility])   CKD (chronic kidney disease), stage IIIa   Discharged Condition: good  Hospital Course:  Patient is 86 year old female with past medical history of essential hypertension, dyslipidemia, history of colon cancer, chronic kidney disease stage III, chronic diastolic congestive heart failure, nonsustained ventricular tachycardia, prior small bowel obstruction who came to the hospital with complaints of nausea vomiting and abdominal pain.  Initial CT scan showed possible small bowel obstruction.  She was given IV fluids, she was given fluid.  Her abdominal pain has resolved this morning.  Repeat KUB did not show any evidence of some obstruction.  Small bowel follow-through showed contrast reached the colon in 1 hour.  Patient low no longer has any small bowel obstruction.  She was given some stool softener.  She had a bowel movement.  At this point, her symptom has resolved, she is medically stable to be discharged.     Consults: general surgery  Significant Diagnostic Studies:  CT ABDOMEN AND PELVIS WITH CONTRAST  TECHNIQUE: Multidetector CT imaging of the abdomen and pelvis was performed using the standard protocol following bolus administration of intravenous contrast.  CONTRAST:  131mL OMNIPAQUE IOHEXOL 300 MG/ML  SOLN  COMPARISON:  May 03, 2016  FINDINGS: Lower chest: The visualized heart size within normal limits. No pericardial fluid/thickening.  There is a small paraesophageal hernia present.  The visualized portions of the lungs are clear.  Hepatobiliary:  Small low-density lesions are again seen throughout the liver parenchyma the largest within the anterior left liver lobe measuring 1.5 cm.The main portal vein is patent. No evidence of calcified gallstones, gallbladder wall thickening or biliary dilatation.  Pancreas: Unremarkable. No pancreatic ductal dilatation or surrounding inflammatory changes.  Spleen: Normal in size without focal abnormality.  Adrenals/Urinary Tract: Both adrenal glands appear normal. The kidneys and collecting system appear normal without evidence of urinary tract calculus or hydronephrosis. Bladder is unremarkable.  Stomach/Bowel: There appears to be mildly dilated fluid-filled loops of small bowel within the anterior lower abdomen with adjacent mild mesenteric edema. However no clear transition point is seen. The distal ileal loops appear to be decompressed. The cecum appears to be mildly distended with a large amount of colonic stool present. No focal wall thickening is noted. Again noted is a surgical anastomosis in the D pelvis.The appendix is normal.  Vascular/Lymphatic: There are no enlarged mesenteric, retroperitoneal, or pelvic lymph nodes. Scattered aortic atherosclerotic calcifications are seen without aneurysmal dilatation.  Reproductive: The patient is status post hysterectomy. No adnexal masses or collections seen.  Other: No evidence of abdominal wall mass or hernia.  Musculoskeletal: No acute or significant osseous findings.  IMPRESSION: Mildly dilated fluid-filled loops of small bowel within the lower abdomen with adjacent mesenteric edema. However no clear transition point is seen. This could be due to early partial small bowel obstruction.  Moderate to large amount of fecal colonic stool.  Aortic Atherosclerosis (ICD10-I70.0).   Electronically Signed   By: Prudencio Pair M.D.   On: 06/28/2019 05:12  DG ABDOMEN ACUTE W/ 1V CHEST  COMPARISON:  CT abdomen and  pelvis June 28, 2019; chest radiograph August 23, 2017  FINDINGS: PA chest: No edema  or airspace opacity. Heart is upper normal in size with pulmonary vascularity normal. Aorta is tortuous, stable. No adenopathy.  Supine and upright abdomen: There is moderate stool in the colon. There is no bowel dilatation or air-fluid level to suggest bowel obstruction. No free air. There are clips in the pelvic region. Contrast is present in the urinary bladder, an apparent result of intravenous contrast administration for CT earlier in the day.  IMPRESSION: Moderate stool in colon. No bowel obstruction or free air evident by radiography.  Lungs clear.  Stable cardiac silhouette.   Electronically Signed   By: Lowella Grip III M.D.   On: 06/28/2019 09:22 ABDOMEN - 2 VIEW  COMPARISON:  CT abdomen and pelvis June 28, 2019  FINDINGS: Supine and upright images were obtained. Moderate stool is noted in the colon. There is currently no appreciable bowel dilatation or air-fluid level to suggest bowel obstruction. No free air. There are surgical clips in the pelvic region. Lung bases are clear.  IMPRESSION: No apparent bowel dilatation or air-fluid level. No findings suggesting bowel obstruction by radiography. No free air. Postoperative changes noted in the pelvis.   Electronically Signed   By: Lowella Grip III M.D.   On: 06/29/2019 10:56  SMALL BOWEL SERIES  COMPARISON:  CT abdomen pelvis, 06/28/2019, abdominal radiographs, 06/29/2019  TECHNIQUE: Following ingestion of thin barium, serial small bowel images were obtained including spot views of the terminal ileum.  FLUOROSCOPY TIME:  Fluoroscopy Time:  None  Number of Acquired Spot Images: 5  FINDINGS: Oral enteric contrast was administered, with transit to the ascending and transverse colon in less than 1 hour following administration. Generally normal contour and caliber of small bowel throughout with  normal plication.  IMPRESSION: Oral enteric contrast was administered, with transit to the colon in less than 1 hour following administration. No evidence of bowel obstruction.   Electronically Signed   By: Eddie Candle M.D.   On: 06/29/2019 11:54   Treatments: IV hydration  Discharge Exam: Blood pressure (!) 146/70, pulse 79, temperature 98.8 F (37.1 C), temperature source Oral, resp. rate 17, height 5\' 2"  (1.575 m), weight 59 kg, last menstrual period 02/21/1966, SpO2 94 %. General appearance: alert and cooperative Resp: clear to auscultation bilaterally Cardio: regular rate and rhythm, S1, S2 normal, no murmur, click, rub or gallop GI: soft, non-tender; bowel sounds normal; no masses,  no organomegaly Extremities: extremities normal, atraumatic, no cyanosis or edema  Disposition: Discharge disposition: 01-Home or Self Care       Discharge Instructions    Diet - low sodium heart healthy   Complete by: As directed    Increase activity slowly   Complete by: As directed      Allergies as of 06/29/2019   No Known Allergies     Medication List    TAKE these medications   acetaminophen 325 MG tablet Commonly known as: TYLENOL Take 650 mg by mouth every 6 (six) hours as needed.   albuterol 108 (90 Base) MCG/ACT inhaler Commonly known as: VENTOLIN HFA Inhale 2 puffs into the lungs every 6 (six) hours as needed for wheezing or shortness of breath.   aspirin EC 81 MG EC tablet Generic drug: aspirin Take 81 mg by mouth daily. Swallow whole.   Entresto 97-103 MG Generic drug: sacubitril-valsartan Take 1 tablet by mouth 2 (two) times daily.   furosemide 40 MG tablet Commonly known as: LASIX Take 1/2 tablet-1 tablet as directed per weight gain.   isosorbide mononitrate 30  MG 24 hr tablet Commonly known as: IMDUR Take 1 tablet (30 mg total) by mouth daily.   metoprolol succinate 25 MG 24 hr tablet Commonly known as: TOPROL-XL Take 1 tablet (25 mg total) by  mouth daily.   rosuvastatin 5 MG tablet Commonly known as: CRESTOR Take 1 tablet (5 mg total) by mouth daily.   senna-docusate 8.6-50 MG tablet Commonly known as: Senokot-S Take 2 tablets by mouth 2 (two) times daily as needed for mild constipation.   spironolactone 25 MG tablet Commonly known as: ALDACTONE Take 1 tablet (25 mg total) by mouth daily.   Vitamin D-3 25 MCG (1000 UT) Caps Take 2,000 Units by mouth daily.      Follow-up Information    Einar Pheasant, MD Follow up in 1 week(s).   Specialty: Internal Medicine Contact information: [address] [place] [address] [phone]           Signed: Sharen Hones 06/29/2019, 12:26 PM

## 2019-07-03 ENCOUNTER — Ambulatory Visit: Payer: Medicare HMO | Admitting: Internal Medicine

## 2019-07-03 DIAGNOSIS — Z0289 Encounter for other administrative examinations: Secondary | ICD-10-CM

## 2019-07-19 ENCOUNTER — Other Ambulatory Visit: Payer: Self-pay

## 2019-07-19 ENCOUNTER — Encounter: Payer: Self-pay | Admitting: Internal Medicine

## 2019-07-19 ENCOUNTER — Ambulatory Visit: Payer: Medicare HMO | Admitting: Internal Medicine

## 2019-07-19 DIAGNOSIS — I5022 Chronic systolic (congestive) heart failure: Secondary | ICD-10-CM | POA: Diagnosis not present

## 2019-07-19 DIAGNOSIS — R945 Abnormal results of liver function studies: Secondary | ICD-10-CM

## 2019-07-19 DIAGNOSIS — I4729 Other ventricular tachycardia: Secondary | ICD-10-CM

## 2019-07-19 DIAGNOSIS — K56600 Partial intestinal obstruction, unspecified as to cause: Secondary | ICD-10-CM

## 2019-07-19 DIAGNOSIS — D72819 Decreased white blood cell count, unspecified: Secondary | ICD-10-CM

## 2019-07-19 DIAGNOSIS — I472 Ventricular tachycardia: Secondary | ICD-10-CM

## 2019-07-19 DIAGNOSIS — I10 Essential (primary) hypertension: Secondary | ICD-10-CM

## 2019-07-19 DIAGNOSIS — I110 Hypertensive heart disease with heart failure: Secondary | ICD-10-CM

## 2019-07-19 DIAGNOSIS — R7989 Other specified abnormal findings of blood chemistry: Secondary | ICD-10-CM

## 2019-07-19 DIAGNOSIS — R739 Hyperglycemia, unspecified: Secondary | ICD-10-CM

## 2019-07-19 DIAGNOSIS — E7800 Pure hypercholesterolemia, unspecified: Secondary | ICD-10-CM

## 2019-07-19 NOTE — Progress Notes (Signed)
[name], female   DOB: [birth year], 86 y.o.   MRN: [id number]   Subjective:    [name], female    DOB: [birth year], 86 y.o.   MRN: [id number]  HPI This visit occurred during the SARS-CoV-2 public health emergency.  Safety protocols were in place, including screening questions prior to the visit, additional usage of staff PPE, and extensive cleaning of exam room while observing appropriate contact time as indicated for disinfecting solutions.  Patient here for hospital follow up.  Admitted 06/28/19 - 06/29/19 with partial small bowel obstruction.  Presented to ER with nausea, vomiting and abdominal pain.  Treated with IVFs.  Her abdominal pain resolved the next morning.  Repeat KUB did not show any evidence of obstruction.  Small bowel follow through showed contrast reached the colon in one hour.  Given stool softener.  Had bowel movement prior to discharge.  Since discharge, she has done well.  No abdominal pain.  Bowels moving.  Eating.  No nausea, vomiting or abdominal pain.  Overall feels back to her baseline.    Past Medical History:  Diagnosis Date  . Asthma   . Chronic systolic CHF (congestive heart failure) ([place])    a. 01/2015 Echo: EF 25-30%, sev diff HK, mild to mod MR, mildly dil LA, nl RV fxn, PASP 61 mmHg.  . Colon cancer ([place]) 2005  . Hypercholesterolemia   . Hypertension   . Hypertensive heart disease with CHF (congestive heart failure) ([place])   . NICM (nonischemic cardiomyopathy) ([place])    a. 01/2015 Echo: EF 25-30% sev diff HK;  b. 01/2015 Lexi MV: EF 19%, small defect of mild severity in apex - likely breast attenuation, no ischemia.  Marland Kitchen NSVT (nonsustained ventricular tachycardia) ([place])   . Osteoporosis    Past Surgical History:  Procedure Laterality Date  . ABDOMINAL HYSTERECTOMY    . BREAST BIOPSY Left 4/15`   BENIGN BREAST EPITHELIUM WITH NODULAR FIBROSIS.  Marland Kitchen INGUINAL HERNIA REPAIR Left 05/04/2016   Procedure: HERNIA REPAIR INGUINAL  ADULT;  Surgeon: Robert Bellow, MD;  Location: [facility] ORS;  Service: General;  Laterality: Left;  . THYROIDECTOMY, PARTIAL  1956  . TUBAL LIGATION     Family History  Problem Relation Age of Onset  . Stroke Mother   . Colon cancer Father   . Breast cancer Daughter 31  . Breast cancer Cousin    Social History   Socioeconomic History  . Marital status: Widowed    Spouse name: Not on file  . Number of children: Not on file  . Years of education: Not on file  . Highest education level: Not on file  Occupational History  . Not on file  Tobacco Use  . Smoking status: Never Smoker  . Smokeless tobacco: Never Used  Vaping Use  . Vaping Use: Never used  Substance and Sexual Activity  . Alcohol use: No    Alcohol/week: 0.0 standard drinks  . Drug use: No  . Sexual activity: Not on file  Other Topics Concern  . Not on file  Social History Narrative  . Not on file   Social Determinants of Health   Financial Resource Strain:   . Difficulty of Paying Living Expenses:   Food Insecurity:   . Worried About Charity fundraiser in the Last Year:   . Arboriculturist in the Last Year:   Transportation Needs:   . Film/video editor (Medical):   Marland Kitchen Lack of  Transportation (Non-Medical):   Physical Activity:   . Days of Exercise per Week:   . Minutes of Exercise per Session:   Stress:   . Feeling of Stress :   Social Connections:   . Frequency of Communication with Friends and Family:   . Frequency of Social Gatherings with Friends and Family:   . Attends Religious Services:   . Active Member of Clubs or Organizations:   . Attends Archivist Meetings:   Marland Kitchen Marital Status:     Outpatient Encounter Medications as of 07/19/2019  Medication Sig  . acetaminophen (TYLENOL) 325 MG tablet Take 650 mg by mouth every 6 (six) hours as needed.  Marland Kitchen albuterol (VENTOLIN HFA) 108 (90 Base) MCG/ACT inhaler Inhale 2 puffs into the lungs every 6 (six) hours as needed for wheezing or  shortness of breath.  Marland Kitchen aspirin (ASPIRIN EC) 81 MG EC tablet Take 81 mg by mouth daily. Swallow whole.  . Cholecalciferol (VITAMIN D-3) 1000 UNITS CAPS Take 2,000 Units by mouth daily.  . furosemide (LASIX) 40 MG tablet Take 1/2 tablet-1 tablet as directed per weight gain.  . isosorbide mononitrate (IMDUR) 30 MG 24 hr tablet Take 1 tablet (30 mg total) by mouth daily.  . metoprolol succinate (TOPROL-XL) 25 MG 24 hr tablet Take 1 tablet (25 mg total) by mouth daily.  . rosuvastatin (CRESTOR) 5 MG tablet Take 1 tablet (5 mg total) by mouth daily.  . sacubitril-valsartan (ENTRESTO) 97-103 MG Take 1 tablet by mouth 2 (two) times daily.  Marland Kitchen senna-docusate (SENOKOT-S) 8.6-50 MG tablet Take 2 tablets by mouth 2 (two) times daily as needed for mild constipation.  Marland Kitchen spironolactone (ALDACTONE) 25 MG tablet Take 1 tablet (25 mg total) by mouth daily.   No facility-administered encounter medications on file as of 07/19/2019.    Review of Systems  Constitutional: Negative for appetite change and unexpected weight change.  HENT: Negative for congestion and sinus pressure.   Respiratory: Negative for cough, chest tightness and shortness of breath.   Cardiovascular: Negative for chest pain, palpitations and leg swelling.  Gastrointestinal: Negative for abdominal pain, diarrhea, nausea and vomiting.  Genitourinary: Negative for difficulty urinating and dysuria.  Musculoskeletal: Negative for joint swelling and myalgias.  Skin: Negative for color change and rash.  Neurological: Negative for dizziness, light-headedness and headaches.  Psychiatric/Behavioral: Negative for agitation and dysphoric mood.       Objective:    Physical Exam Vitals reviewed.  Constitutional:      General: She is not in acute distress.    Appearance: Normal appearance.  HENT:     Head: Normocephalic and atraumatic.     Right Ear: External ear normal.     Left Ear: External ear normal.  Eyes:     General: No scleral  icterus.       Right eye: No discharge.        Left eye: No discharge.     Conjunctiva/sclera: Conjunctivae normal.  Neck:     Thyroid: No thyromegaly.  Cardiovascular:     Rate and Rhythm: Normal rate and regular rhythm.  Pulmonary:     Effort: No respiratory distress.     Breath sounds: Normal breath sounds. No wheezing.  Abdominal:     General: Bowel sounds are normal.     Palpations: Abdomen is soft.     Tenderness: There is no abdominal tenderness.  Musculoskeletal:        General: No swelling or tenderness.     Cervical back:  Neck supple. No tenderness.  Lymphadenopathy:     Cervical: No cervical adenopathy.  Skin:    Findings: No erythema or rash.  Neurological:     Mental Status: She is alert.  Psychiatric:        Behavior: Behavior normal.     BP 120/78   Pulse 68   Temp 97.8 F (36.6 C)   Resp 16   Ht _0  (1.6 m)   Wt 128 lb 6.4 oz (58.2 kg)   LMP 02/21/1966   SpO2 97%   BMI 22.75 kg/m  Wt Readings from Last 3 Encounters:  07/19/19 128 lb 6.4 oz (58.2 kg)  06/28/19 130 lb (59 kg)  04/05/19 129 lb (58.5 kg)     Lab Results  Component Value Date   WBC 6.9 06/29/2019   HGB 12.6 06/29/2019   HCT 38.8 06/29/2019   PLT 214 06/29/2019   GLUCOSE 106 (H) 06/29/2019   CHOL 154 05/03/2019   TRIG 52.0 05/03/2019   HDL 77.30 05/03/2019   LDLDIRECT 128.3 02/08/2013   LDLCALC 66 05/03/2019   ALT 13 06/28/2019   AST 21 06/28/2019   NA 141 06/29/2019   K 4.3 06/29/2019   CL 104 06/29/2019   CREATININE 0.99 06/29/2019   BUN 29 (H) 06/29/2019   CO2 28 06/29/2019   TSH 0.64 12/06/2018   INR 0.9 06/28/2019   HGBA1C 6.1 05/03/2019    CT ABDOMEN PELVIS W CONTRAST  Result Date: 06/28/2019 CLINICAL DATA:  Nausea abdominal pain distension EXAM: CT ABDOMEN AND PELVIS WITH CONTRAST TECHNIQUE: Multidetector CT imaging of the abdomen and pelvis was performed using the standard protocol following bolus administration of intravenous contrast. CONTRAST:  137m  OMNIPAQUE IOHEXOL 300 MG/ML  SOLN COMPARISON:  May 03, 2016 FINDINGS: Lower chest: The visualized heart size within normal limits. No pericardial fluid/thickening. There is a small paraesophageal hernia present. The visualized portions of the lungs are clear. Hepatobiliary: Small low-density lesions are again seen throughout the liver parenchyma the largest within the anterior left liver lobe measuring 1.5 cm.The main portal vein is patent. No evidence of calcified gallstones, gallbladder wall thickening or biliary dilatation. Pancreas: Unremarkable. No pancreatic ductal dilatation or surrounding inflammatory changes. Spleen: Normal in size without focal abnormality. Adrenals/Urinary Tract: Both adrenal glands appear normal. The kidneys and collecting system appear normal without evidence of urinary tract calculus or hydronephrosis. Bladder is unremarkable. Stomach/Bowel: There appears to be mildly dilated fluid-filled loops of small bowel within the anterior lower abdomen with adjacent mild mesenteric edema. However no clear transition point is seen. The distal ileal loops appear to be decompressed. The cecum appears to be mildly distended with a large amount of colonic stool present. No focal wall thickening is noted. Again noted is a surgical anastomosis in the D pelvis.The appendix is normal. Vascular/Lymphatic: There are no enlarged mesenteric, retroperitoneal, or pelvic lymph nodes. Scattered aortic atherosclerotic calcifications are seen without aneurysmal dilatation. Reproductive: The patient is status post hysterectomy. No adnexal masses or collections seen. Other: No evidence of abdominal wall mass or hernia. Musculoskeletal: No acute or significant osseous findings. IMPRESSION: Mildly dilated fluid-filled loops of small bowel within the lower abdomen with adjacent mesenteric edema. However no clear transition point is seen. This could be due to early partial small bowel obstruction. Moderate to large  amount of fecal colonic stool. Aortic Atherosclerosis (ICD10-I70.0). Electronically Signed   By: BPrudencio PairM.D.   On: 06/28/2019 05:12   DG SMALL BOWEL W SINGLE CM (SOL OR  THIN BA)  Result Date: 06/29/2019 CLINICAL DATA:  Assess for possible small bowel obstruction EXAM: SMALL BOWEL SERIES COMPARISON:  CT abdomen pelvis, 06/28/2019, abdominal radiographs, 06/29/2019 TECHNIQUE: Following ingestion of thin barium, serial small bowel images were obtained including spot views of the terminal ileum. FLUOROSCOPY TIME:  Fluoroscopy Time:  None Number of Acquired Spot Images: 5 FINDINGS: Oral enteric contrast was administered, with transit to the ascending and transverse colon in less than 1 hour following administration. Generally normal contour and caliber of small bowel throughout with normal plication. IMPRESSION: Oral enteric contrast was administered, with transit to the colon in less than 1 hour following administration. No evidence of bowel obstruction. Electronically Signed   By: Eddie Candle M.D.   On: 06/29/2019 11:54   DG Abd 2 Views  Result Date: 06/29/2019 CLINICAL DATA:  Recent small-bowel obstruction EXAM: ABDOMEN - 2 VIEW COMPARISON:  CT abdomen and pelvis June 28, 2019 FINDINGS: Supine and upright images were obtained. Moderate stool is noted in the colon. There is currently no appreciable bowel dilatation or air-fluid level to suggest bowel obstruction. No free air. There are surgical clips in the pelvic region. Lung bases are clear. IMPRESSION: No apparent bowel dilatation or air-fluid level. No findings suggesting bowel obstruction by radiography. No free air. Postoperative changes noted in the pelvis. Electronically Signed   By: Lowella Grip III M.D.   On: 06/29/2019 10:56   DG ABD ACUTE 2+V W 1V CHEST  Result Date: 06/28/2019 CLINICAL DATA:  Nausea and vomiting EXAM: DG ABDOMEN ACUTE W/ 1V CHEST COMPARISON:  CT abdomen and pelvis June 28, 2019; chest radiograph August 23, 2017 FINDINGS:  PA chest: No edema or airspace opacity. Heart is upper normal in size with pulmonary vascularity normal. Aorta is tortuous, stable. No adenopathy. Supine and upright abdomen: There is moderate stool in the colon. There is no bowel dilatation or air-fluid level to suggest bowel obstruction. No free air. There are clips in the pelvic region. Contrast is present in the urinary bladder, an apparent result of intravenous contrast administration for CT earlier in the day. IMPRESSION: Moderate stool in colon. No bowel obstruction or free air evident by radiography. Lungs clear.  Stable cardiac silhouette. Electronically Signed   By: Lowella Grip III M.D.   On: 06/28/2019 09:22       Assessment & Plan:   Problem List Items Addressed This Visit    Abnormal liver function tests    Follow liver function tests.        Chronic systolic heart failure (HCC) (Chronic)    Followed by cardiology.  Taking lasix, metoprolol, spironolactone and imdur.  No chest pain.  Breathing stable.  Follow.        Essential hypertension    Blood pressure under good control.  Continue aldactone, metoprolol, entresto and lasix.  Follow metabolic panel.       Hypercholesterolemia    On crestor.  Follow lipid panel and liver function tests.        Hyperglycemia    Low carb diet and exercise.  Follow met b and a1c.        Hypertensive heart disease with CHF (congestive heart failure) (HCC)    No evidence of volume overload.  Continue spironolactone, lasix, metoprolol.  Follow.  Seeing cardiology.        Leukopenia    Follow cbc.       NSVT (nonsustained ventricular tachycardia) ([place])    Documented per cardiology.  Doing well on metoprolol.  Follow.        Partial small bowel obstruction ([place])    Admitted with partial small bowel obstruction.  Given IVFs.  F/u xrays - ok.  She is eating.  No nausea or vomiting.  No abdominal pain.  Bowels moving.  citrucel - to keep bowels moving.            Einar Pheasant, MD

## 2019-07-19 NOTE — Patient Instructions (Signed)
citrucel - mix in whatever you drink in the am.  Let me know if any problems.

## 2019-07-29 ENCOUNTER — Encounter: Payer: Self-pay | Admitting: Internal Medicine

## 2019-07-29 NOTE — Assessment & Plan Note (Signed)
Documented per cardiology.  Doing well on metoprolol.  Follow.

## 2019-07-29 NOTE — Assessment & Plan Note (Signed)
Follow cbc.  

## 2019-07-29 NOTE — Assessment & Plan Note (Signed)
Blood pressure under good control.  Continue aldactone, metoprolol, entresto and lasix.  Follow metabolic panel.

## 2019-07-29 NOTE — Assessment & Plan Note (Signed)
No evidence of volume overload.  Continue spironolactone, lasix, metoprolol.  Follow.  Seeing cardiology.

## 2019-07-29 NOTE — Assessment & Plan Note (Signed)
Low carb diet and exercise.  Follow met b and a1c.

## 2019-07-29 NOTE — Assessment & Plan Note (Signed)
Followed by cardiology.  Taking lasix, metoprolol, spironolactone and imdur.  No chest pain.  Breathing stable.  Follow.

## 2019-07-29 NOTE — Assessment & Plan Note (Signed)
On crestor.  Follow lipid panel and liver function tests.   

## 2019-07-29 NOTE — Assessment & Plan Note (Addendum)
Admitted with partial small bowel obstruction.  Given IVFs.  F/u xrays - ok.  She is eating.  No nausea or vomiting.  No abdominal pain.  Bowels moving.  citrucel - to keep bowels moving.

## 2019-07-29 NOTE — Assessment & Plan Note (Signed)
Follow liver function tests.   

## 2019-08-06 ENCOUNTER — Ambulatory Visit: Payer: Medicare HMO | Admitting: Internal Medicine

## 2019-08-09 ENCOUNTER — Other Ambulatory Visit (INDEPENDENT_AMBULATORY_CARE_PROVIDER_SITE_OTHER): Payer: Medicare HMO

## 2019-08-09 ENCOUNTER — Other Ambulatory Visit: Payer: Self-pay

## 2019-08-09 DIAGNOSIS — I10 Essential (primary) hypertension: Secondary | ICD-10-CM

## 2019-08-09 DIAGNOSIS — E7800 Pure hypercholesterolemia, unspecified: Secondary | ICD-10-CM

## 2019-08-09 DIAGNOSIS — Z85038 Personal history of other malignant neoplasm of large intestine: Secondary | ICD-10-CM | POA: Diagnosis not present

## 2019-08-09 DIAGNOSIS — R739 Hyperglycemia, unspecified: Secondary | ICD-10-CM

## 2019-08-09 LAB — BASIC METABOLIC PANEL
BUN: 24 mg/dL — ABNORMAL HIGH (ref 6–23)
CO2: 33 mEq/L — ABNORMAL HIGH (ref 19–32)
Calcium: 9.7 mg/dL (ref 8.4–10.5)
Chloride: 103 mEq/L (ref 96–112)
Creatinine, Ser: 1.24 mg/dL — ABNORMAL HIGH (ref 0.40–1.20)
GFR: 49.24 mL/min — ABNORMAL LOW (ref >60.00)
Glucose, Bld: 115 mg/dL — ABNORMAL HIGH (ref 70–99)
Potassium: 4.8 mEq/L (ref 3.5–5.1)
Sodium: 142 mEq/L (ref 135–145)

## 2019-08-09 LAB — LIPID PANEL
Cholesterol: 149 mg/dL (ref 0–200)
HDL: 73.9 mg/dL (ref >39.00)
LDL Cholesterol: 64 mg/dL (ref 0–99)
NonHDL: 74.99
Total CHOL/HDL Ratio: 2
Triglycerides: 56 mg/dL (ref 0.0–149.0)
VLDL: 11.2 mg/dL (ref 0.0–40.0)

## 2019-08-09 LAB — HEPATIC FUNCTION PANEL
ALT: 9 U/L (ref 0–35)
AST: 16 U/L (ref 0–37)
Albumin: 4.5 g/dL (ref 3.5–5.2)
Alkaline Phosphatase: 52 U/L (ref 39–117)
Bilirubin, Direct: 0.1 mg/dL (ref 0.0–0.3)
Total Bilirubin: 0.4 mg/dL (ref 0.2–1.2)
Total Protein: 7.2 g/dL (ref 6.0–8.3)

## 2019-08-09 LAB — HEMOGLOBIN A1C: Hgb A1c MFr Bld: 6.1 % (ref 4.6–6.5)

## 2019-08-10 LAB — CEA: CEA: 3.4 ng/mL — ABNORMAL HIGH

## 2019-08-13 ENCOUNTER — Other Ambulatory Visit: Payer: Self-pay

## 2019-08-13 ENCOUNTER — Ambulatory Visit: Payer: Medicare HMO | Admitting: Internal Medicine

## 2019-08-13 DIAGNOSIS — E7800 Pure hypercholesterolemia, unspecified: Secondary | ICD-10-CM

## 2019-08-13 DIAGNOSIS — I472 Ventricular tachycardia: Secondary | ICD-10-CM | POA: Diagnosis not present

## 2019-08-13 DIAGNOSIS — J4520 Mild intermittent asthma, uncomplicated: Secondary | ICD-10-CM

## 2019-08-13 DIAGNOSIS — Z85038 Personal history of other malignant neoplasm of large intestine: Secondary | ICD-10-CM

## 2019-08-13 DIAGNOSIS — N1831 Chronic kidney disease, stage 3a: Secondary | ICD-10-CM

## 2019-08-13 DIAGNOSIS — I4729 Other ventricular tachycardia: Secondary | ICD-10-CM

## 2019-08-13 DIAGNOSIS — I10 Essential (primary) hypertension: Secondary | ICD-10-CM

## 2019-08-13 DIAGNOSIS — R7989 Other specified abnormal findings of blood chemistry: Secondary | ICD-10-CM

## 2019-08-13 DIAGNOSIS — R945 Abnormal results of liver function studies: Secondary | ICD-10-CM

## 2019-08-13 DIAGNOSIS — D72819 Decreased white blood cell count, unspecified: Secondary | ICD-10-CM

## 2019-08-13 DIAGNOSIS — I5022 Chronic systolic (congestive) heart failure: Secondary | ICD-10-CM

## 2019-08-13 DIAGNOSIS — R739 Hyperglycemia, unspecified: Secondary | ICD-10-CM

## 2019-08-13 NOTE — Progress Notes (Signed)
[name], female   DOB: Aug 02, 1928, 86 y.o.   MRN: [id number]   Subjective:    [name], female    DOB: 08/25/1928, 86 y.o.   MRN: [id number]  HPI This visit occurred during the SARS-CoV-2 public health emergency.  Safety protocols were in place, including screening questions prior to the visit, additional usage of staff PPE, and extensive cleaning of exam room while observing appropriate contact time as indicated for disinfecting solutions.  Patient here for a scheduled follow up. Admitted 06/28/19 - 06/29/19 with partial small bowel obstruction.  Since her discharge she has done well.  Eating.  Bowels moving.  No abdominal pain or cramping.  Had neck pain last week.  Better now.  No chest pain.  Breathing stable.  No acid reflux.  Blood pressure doing well.  Discussed labs.  CEA increased.  Discussed referral to oncology.    Past Medical History:  Diagnosis Date  . Asthma   . Chronic systolic CHF (congestive heart failure) ([place])    a. 01/2015 Echo: EF 25-30%, sev diff HK, mild to mod MR, mildly dil LA, nl RV fxn, PASP 61 mmHg.  . Colon cancer ([place]) 2005  . Hypercholesterolemia   . Hypertension   . Hypertensive heart disease with CHF (congestive heart failure) ([place])   . NICM (nonischemic cardiomyopathy) ([place])    a. 01/2015 Echo: EF 25-30% sev diff HK;  b. 01/2015 Lexi MV: EF 19%, small defect of mild severity in apex - likely breast attenuation, no ischemia.  Marland Kitchen NSVT (nonsustained ventricular tachycardia) ([place])   . Osteoporosis    Past Surgical History:  Procedure Laterality Date  . ABDOMINAL HYSTERECTOMY    . BREAST BIOPSY Left 4/15`   BENIGN BREAST EPITHELIUM WITH NODULAR FIBROSIS.  Marland Kitchen INGUINAL HERNIA REPAIR Left 05/04/2016   Procedure: HERNIA REPAIR INGUINAL ADULT;  Surgeon: Robert Bellow, MD;  Location: [facility] ORS;  Service: General;  Laterality: Left;  . THYROIDECTOMY, PARTIAL  1956  . TUBAL LIGATION     Family History  Problem Relation Age  of Onset  . Stroke Mother   . Colon cancer Father   . Breast cancer Daughter 18  . Breast cancer Cousin    Social History   Socioeconomic History  . Marital status: Widowed    Spouse name: Not on file  . Number of children: Not on file  . Years of education: Not on file  . Highest education level: Not on file  Occupational History  . Not on file  Tobacco Use  . Smoking status: Never Smoker  . Smokeless tobacco: Never Used  Vaping Use  . Vaping Use: Never used  Substance and Sexual Activity  . Alcohol use: No    Alcohol/week: 0.0 standard drinks  . Drug use: No  . Sexual activity: Not on file  Other Topics Concern  . Not on file  Social History Narrative  . Not on file   Social Determinants of Health   Financial Resource Strain:   . Difficulty of Paying Living Expenses:   Food Insecurity:   . Worried About Charity fundraiser in the Last Year:   . Arboriculturist in the Last Year:   Transportation Needs:   . Film/video editor (Medical):   Marland Kitchen Lack of Transportation (Non-Medical):   Physical Activity:   . Days of Exercise per Week:   . Minutes of Exercise per Session:   Stress:   . Feeling of Stress :  Social Connections:   . Frequency of Communication with Friends and Family:   . Frequency of Social Gatherings with Friends and Family:   . Attends Religious Services:   . Active Member of Clubs or Organizations:   . Attends Archivist Meetings:   Marland Kitchen Marital Status:     Outpatient Encounter Medications as of 08/13/2019  Medication Sig  . acetaminophen (TYLENOL) 325 MG tablet Take 650 mg by mouth every 6 (six) hours as needed.  Marland Kitchen albuterol (VENTOLIN HFA) 108 (90 Base) MCG/ACT inhaler Inhale 2 puffs into the lungs every 6 (six) hours as needed for wheezing or shortness of breath.  Marland Kitchen aspirin (ASPIRIN EC) 81 MG EC tablet Take 81 mg by mouth daily. Swallow whole.  . Cholecalciferol (VITAMIN D-3) 1000 UNITS CAPS Take 2,000 Units by mouth daily.  .  furosemide (LASIX) 40 MG tablet Take 1/2 tablet-1 tablet as directed per weight gain.  . isosorbide mononitrate (IMDUR) 30 MG 24 hr tablet Take 1 tablet (30 mg total) by mouth daily.  . metoprolol succinate (TOPROL-XL) 25 MG 24 hr tablet Take 1 tablet (25 mg total) by mouth daily.  . rosuvastatin (CRESTOR) 5 MG tablet Take 1 tablet (5 mg total) by mouth daily.  . sacubitril-valsartan (ENTRESTO) 97-103 MG Take 1 tablet by mouth 2 (two) times daily.  Marland Kitchen senna-docusate (SENOKOT-S) 8.6-50 MG tablet Take 2 tablets by mouth 2 (two) times daily as needed for mild constipation.  Marland Kitchen spironolactone (ALDACTONE) 25 MG tablet Take 1 tablet (25 mg total) by mouth daily.   No facility-administered encounter medications on file as of 08/13/2019.    Review of Systems  Constitutional: Negative for appetite change and unexpected weight change.  HENT: Negative for congestion and sinus pressure.   Respiratory: Negative for cough and chest tightness.        Breathing stable.    Cardiovascular: Negative for chest pain, palpitations and leg swelling.  Gastrointestinal: Negative for abdominal pain, diarrhea, nausea and vomiting.  Genitourinary: Negative for difficulty urinating and dysuria.  Musculoskeletal: Negative for joint swelling and myalgias.       Neck better.   Skin: Negative for color change and rash.  Neurological: Negative for dizziness, light-headedness and headaches.  Psychiatric/Behavioral: Negative for agitation and dysphoric mood.       Objective:    Physical Exam Vitals reviewed.  Constitutional:      General: She is not in acute distress.    Appearance: Normal appearance.  HENT:     Head: Normocephalic and atraumatic.     Right Ear: External ear normal.     Left Ear: External ear normal.  Eyes:     General: No scleral icterus.       Right eye: No discharge.        Left eye: No discharge.     Conjunctiva/sclera: Conjunctivae normal.  Neck:     Thyroid: No thyromegaly.    Cardiovascular:     Rate and Rhythm: Normal rate and regular rhythm.  Pulmonary:     Effort: No respiratory distress.     Breath sounds: Normal breath sounds. No wheezing.  Abdominal:     General: Bowel sounds are normal.     Palpations: Abdomen is soft.     Tenderness: There is no abdominal tenderness.  Musculoskeletal:        General: No swelling or tenderness.     Cervical back: Neck supple. No tenderness.  Lymphadenopathy:     Cervical: No cervical adenopathy.  Skin:  Findings: No erythema or rash.  Neurological:     Mental Status: She is alert.  Psychiatric:        Mood and Affect: Mood normal.        Behavior: Behavior normal.     BP 126/78   Pulse 76   Temp 98.1 F (36.7 C)   Resp 16   Ht 5' 3"  (1.6 m)   Wt 128 lb (58.1 kg)   LMP 02/21/1966   SpO2 96%   BMI 22.67 kg/m  Wt Readings from Last 3 Encounters:  08/13/19 128 lb (58.1 kg)  07/19/19 128 lb 6.4 oz (58.2 kg)  06/28/19 130 lb (59 kg)     Lab Results  Component Value Date   WBC 6.9 06/29/2019   HGB 12.6 06/29/2019   HCT 38.8 06/29/2019   PLT 214 06/29/2019   GLUCOSE 98 08/13/2019   CHOL 149 08/09/2019   TRIG 56.0 08/09/2019   HDL 73.90 08/09/2019   LDLDIRECT 128.3 02/08/2013   LDLCALC 64 08/09/2019   ALT 9 08/09/2019   AST 16 08/09/2019   NA 141 08/13/2019   K 4.6 08/13/2019   CL 102 08/13/2019   CREATININE 1.11 08/13/2019   BUN 22 08/13/2019   CO2 33 (H) 08/13/2019   TSH 0.64 12/06/2018   INR 0.9 06/28/2019   HGBA1C 6.1 08/09/2019    CT ABDOMEN PELVIS W CONTRAST  Result Date: 06/28/2019 CLINICAL DATA:  Nausea abdominal pain distension EXAM: CT ABDOMEN AND PELVIS WITH CONTRAST TECHNIQUE: Multidetector CT imaging of the abdomen and pelvis was performed using the standard protocol following bolus administration of intravenous contrast. CONTRAST:  117m OMNIPAQUE IOHEXOL 300 MG/ML  SOLN COMPARISON:  May 03, 2016 FINDINGS: Lower chest: The visualized heart size within normal limits. No  pericardial fluid/thickening. There is a small paraesophageal hernia present. The visualized portions of the lungs are clear. Hepatobiliary: Small low-density lesions are again seen throughout the liver parenchyma the largest within the anterior left liver lobe measuring 1.5 cm.The main portal vein is patent. No evidence of calcified gallstones, gallbladder wall thickening or biliary dilatation. Pancreas: Unremarkable. No pancreatic ductal dilatation or surrounding inflammatory changes. Spleen: Normal in size without focal abnormality. Adrenals/Urinary Tract: Both adrenal glands appear normal. The kidneys and collecting system appear normal without evidence of urinary tract calculus or hydronephrosis. Bladder is unremarkable. Stomach/Bowel: There appears to be mildly dilated fluid-filled loops of small bowel within the anterior lower abdomen with adjacent mild mesenteric edema. However no clear transition point is seen. The distal ileal loops appear to be decompressed. The cecum appears to be mildly distended with a large amount of colonic stool present. No focal wall thickening is noted. Again noted is a surgical anastomosis in the D pelvis.The appendix is normal. Vascular/Lymphatic: There are no enlarged mesenteric, retroperitoneal, or pelvic lymph nodes. Scattered aortic atherosclerotic calcifications are seen without aneurysmal dilatation. Reproductive: The patient is status post hysterectomy. No adnexal masses or collections seen. Other: No evidence of abdominal wall mass or hernia. Musculoskeletal: No acute or significant osseous findings. IMPRESSION: Mildly dilated fluid-filled loops of small bowel within the lower abdomen with adjacent mesenteric edema. However no clear transition point is seen. This could be due to early partial small bowel obstruction. Moderate to large amount of fecal colonic stool. Aortic Atherosclerosis (ICD10-I70.0). Electronically Signed   By: BPrudencio PairM.D.   On: 06/28/2019 05:12     DG SMALL BOWEL W SINGLE CM (SOL OR THIN BA)  Result Date: 06/29/2019 CLINICAL DATA:  Assess for possible small bowel obstruction EXAM: SMALL BOWEL SERIES COMPARISON:  CT abdomen pelvis, 06/28/2019, abdominal radiographs, 06/29/2019 TECHNIQUE: Following ingestion of thin barium, serial small bowel images were obtained including spot views of the terminal ileum. FLUOROSCOPY TIME:  Fluoroscopy Time:  None Number of Acquired Spot Images: 5 FINDINGS: Oral enteric contrast was administered, with transit to the ascending and transverse colon in less than 1 hour following administration. Generally normal contour and caliber of small bowel throughout with normal plication. IMPRESSION: Oral enteric contrast was administered, with transit to the colon in less than 1 hour following administration. No evidence of bowel obstruction. Electronically Signed   By: Eddie Candle M.D.   On: 06/29/2019 11:54   DG Abd 2 Views  Result Date: 06/29/2019 CLINICAL DATA:  Recent small-bowel obstruction EXAM: ABDOMEN - 2 VIEW COMPARISON:  CT abdomen and pelvis June 28, 2019 FINDINGS: Supine and upright images were obtained. Moderate stool is noted in the colon. There is currently no appreciable bowel dilatation or air-fluid level to suggest bowel obstruction. No free air. There are surgical clips in the pelvic region. Lung bases are clear. IMPRESSION: No apparent bowel dilatation or air-fluid level. No findings suggesting bowel obstruction by radiography. No free air. Postoperative changes noted in the pelvis. Electronically Signed   By: Lowella Grip III M.D.   On: 06/29/2019 10:56   DG ABD ACUTE 2+V W 1V CHEST  Result Date: 06/28/2019 CLINICAL DATA:  Nausea and vomiting EXAM: DG ABDOMEN ACUTE W/ 1V CHEST COMPARISON:  CT abdomen and pelvis June 28, 2019; chest radiograph August 23, 2017 FINDINGS: PA chest: No edema or airspace opacity. Heart is upper normal in size with pulmonary vascularity normal. Aorta is tortuous, stable. No  adenopathy. Supine and upright abdomen: There is moderate stool in the colon. There is no bowel dilatation or air-fluid level to suggest bowel obstruction. No free air. There are clips in the pelvic region. Contrast is present in the urinary bladder, an apparent result of intravenous contrast administration for CT earlier in the day. IMPRESSION: Moderate stool in colon. No bowel obstruction or free air evident by radiography. Lungs clear.  Stable cardiac silhouette. Electronically Signed   By: Lowella Grip III M.D.   On: 06/28/2019 09:22       Assessment & Plan:   Problem List Items Addressed This Visit    Abnormal liver function tests    Liver function tests just checked and wnl.  Follow.       Asthma    Has been followed by pulmonary.  Breathing stable.        Chronic systolic heart failure (HCC) (Chronic)    Followed by cardiology.  Taking lasix, metoprolol, spironolactone and imdur.  No chest pain.  Breathing stable. No evidence of volume overload on exam.  Follow.        CKD (chronic kidney disease)    GFR 49.  Avoid antiinflammatories.  Follow metabolic panel.       Relevant Orders   Basic metabolic panel (Completed)   Basic metabolic panel   Essential hypertension    Blood pressure doing well.  Continue aldactone, metoprolol, entresto and lasix.  Follow metabolic panel.       Relevant Orders   TSH   History of colon cancer    CEA increasing.  Will have oncology evaluate with question of need for further w/up (CT,etc).        Relevant Orders   Ambulatory referral to Oncology   Hypercholesterolemia  On crestor.  Discussed qod dosing.  Follow lipid panel and liver function tests.        Relevant Orders   Lipid panel   Hepatic function panel   Hyperglycemia    Follow met b and a1c.       Relevant Orders   Hemoglobin A1c   Leukopenia    Follow cbc.       NSVT (nonsustained ventricular tachycardia) ([place])    Documented per cardiology.  Stable.  Doing  well on metoprolol.           Einar Pheasant, MD

## 2019-08-14 LAB — BASIC METABOLIC PANEL
BUN: 22 mg/dL (ref 6–23)
CO2: 33 mEq/L — ABNORMAL HIGH (ref 19–32)
Calcium: 9.8 mg/dL (ref 8.4–10.5)
Chloride: 102 mEq/L (ref 96–112)
Creatinine, Ser: 1.11 mg/dL (ref 0.40–1.20)
GFR: 55.95 mL/min — ABNORMAL LOW (ref >60.00)
Glucose, Bld: 98 mg/dL (ref 70–99)
Potassium: 4.6 mEq/L (ref 3.5–5.1)
Sodium: 141 mEq/L (ref 135–145)

## 2019-08-19 ENCOUNTER — Encounter: Payer: Self-pay | Admitting: Internal Medicine

## 2019-08-19 NOTE — Assessment & Plan Note (Signed)
Documented per cardiology.  Stable.  Doing well on metoprolol.

## 2019-08-19 NOTE — Assessment & Plan Note (Signed)
Blood pressure doing well.  Continue aldactone, metoprolol, entresto and lasix.  Follow metabolic panel.

## 2019-08-19 NOTE — Assessment & Plan Note (Signed)
GFR 49.  Avoid antiinflammatories.  Follow metabolic panel.

## 2019-08-19 NOTE — Assessment & Plan Note (Signed)
Follow met b and a1c.  

## 2019-08-19 NOTE — Assessment & Plan Note (Signed)
Followed by cardiology.  Taking lasix, metoprolol, spironolactone and imdur.  No chest pain.  Breathing stable. No evidence of volume overload on exam.  Follow.

## 2019-08-19 NOTE — Assessment & Plan Note (Signed)
Has been followed by pulmonary.  Breathing stable.  

## 2019-08-19 NOTE — Assessment & Plan Note (Signed)
On crestor.  Discussed qod dosing.  Follow lipid panel and liver function tests.

## 2019-08-19 NOTE — Assessment & Plan Note (Signed)
Follow cbc.  

## 2019-08-19 NOTE — Assessment & Plan Note (Signed)
CEA increasing.  Will have oncology evaluate with question of need for further w/up (CT,etc).

## 2019-08-19 NOTE — Assessment & Plan Note (Signed)
Liver function tests just checked and wnl.  Follow.   

## 2019-08-27 ENCOUNTER — Inpatient Hospital Stay: Payer: Medicare HMO | Attending: Internal Medicine | Admitting: Internal Medicine

## 2019-08-27 ENCOUNTER — Inpatient Hospital Stay: Payer: Medicare HMO

## 2019-08-27 ENCOUNTER — Other Ambulatory Visit: Payer: Self-pay

## 2019-08-27 ENCOUNTER — Encounter: Payer: Self-pay | Admitting: Internal Medicine

## 2019-08-27 VITALS — BP 124/76 | HR 73 | Temp 98.5°F | Resp 16 | Ht 63.0 in | Wt 129.0 lb

## 2019-08-27 DIAGNOSIS — N183 Chronic kidney disease, stage 3 unspecified: Secondary | ICD-10-CM | POA: Diagnosis not present

## 2019-08-27 DIAGNOSIS — Z85038 Personal history of other malignant neoplasm of large intestine: Secondary | ICD-10-CM | POA: Diagnosis not present

## 2019-08-27 DIAGNOSIS — Z803 Family history of malignant neoplasm of breast: Secondary | ICD-10-CM | POA: Insufficient documentation

## 2019-08-27 DIAGNOSIS — R978 Other abnormal tumor markers: Secondary | ICD-10-CM

## 2019-08-27 DIAGNOSIS — Z7982 Long term (current) use of aspirin: Secondary | ICD-10-CM | POA: Diagnosis not present

## 2019-08-27 DIAGNOSIS — Z800 Family history of malignant neoplasm of digestive organs: Secondary | ICD-10-CM | POA: Diagnosis not present

## 2019-08-27 DIAGNOSIS — J45909 Unspecified asthma, uncomplicated: Secondary | ICD-10-CM | POA: Diagnosis not present

## 2019-08-27 DIAGNOSIS — I110 Hypertensive heart disease with heart failure: Secondary | ICD-10-CM | POA: Insufficient documentation

## 2019-08-27 DIAGNOSIS — R970 Elevated carcinoembryonic antigen [CEA]: Secondary | ICD-10-CM | POA: Insufficient documentation

## 2019-08-27 DIAGNOSIS — E7800 Pure hypercholesterolemia, unspecified: Secondary | ICD-10-CM | POA: Diagnosis not present

## 2019-08-27 DIAGNOSIS — Z79899 Other long term (current) drug therapy: Secondary | ICD-10-CM | POA: Diagnosis not present

## 2019-08-27 DIAGNOSIS — I5022 Chronic systolic (congestive) heart failure: Secondary | ICD-10-CM | POA: Diagnosis not present

## 2019-08-27 NOTE — Progress Notes (Signed)
[facility] CONSULT NOTE  Patient Care Team: Einar Pheasant, MD as PCP - General (Internal Medicine) Bary Castilla, Forest Gleason, MD (General Surgery) Einar Pheasant, MD (Internal Medicine) Alisa Graff, FNP as Nurse Practitioner (Family Medicine) Erby Pian, MD as Referring Physician (Pulmonary Disease) Minna Merritts, MD as Consulting Physician (Cardiology)  CHIEF COMPLAINTS/PURPOSE OF CONSULTATION: c  # INTRAMUCOSAL ADENO CA [ 2005] S/P SIGMOID RESECTION; 13 LN=neg for cancer [ Dr.Witon Smith/Dr.Choksi]; colo 2009- sigmoid polyp; 2014- colonoscopy-Diverticulosis otherwise-NEG.   # CKD- Stage III/ CHF compensated [Dr.Gollan]  Oncology History   No history exists.    HISTORY OF PRESENTING ILLNESS:  Kathy Dominguez 86 y.o.  female with a history of CKD/CHF; and remote history of colon cancer stage I is here for initial consultation given elevated tumor marker.  Patient denies any abdominal pain.  Any nausea vomiting.  Denies any blood in stools or black or stools.   On routine surveillance patient noted to have slightly elevated CEA of the baseline.  She has not had any recent abdominal imaging.  Otherwise she is baseline health.   Review of Systems  Constitutional: Negative for chills, diaphoresis, fever, malaise/fatigue and weight loss.  HENT: Negative for nosebleeds and sore throat.   Eyes: Negative for double vision.  Respiratory: Negative for cough, hemoptysis, sputum production, shortness of breath and wheezing.   Cardiovascular: Negative for chest pain, palpitations, orthopnea and leg swelling.  Gastrointestinal: Negative for abdominal pain, blood in stool, constipation, diarrhea, heartburn, melena, nausea and vomiting.  Genitourinary: Negative for dysuria, frequency and urgency.  Musculoskeletal: Positive for back pain and joint pain.  Skin: Negative.  Negative for itching and rash.  Neurological: Negative for dizziness, tingling, focal  weakness, weakness and headaches.  Endo/Heme/Allergies: Does not bruise/bleed easily.  Psychiatric/Behavioral: Negative for depression. The patient is not nervous/anxious and does not have insomnia.      MEDICAL HISTORY:  Past Medical History:  Diagnosis Date  . Asthma   . Chronic systolic CHF (congestive heart failure) ([place])    a. 01/2015 Echo: EF 25-30%, sev diff HK, mild to mod MR, mildly dil LA, nl RV fxn, PASP 61 mmHg.  . Colon cancer ([place]) 2005  . Hypercholesterolemia   . Hypertension   . Hypertensive heart disease with CHF (congestive heart failure) ([place])   . NICM (nonischemic cardiomyopathy) ([place])    a. 01/2015 Echo: EF 25-30% sev diff HK;  b. 01/2015 Lexi MV: EF 19%, small defect of mild severity in apex - likely breast attenuation, no ischemia.  Marland Kitchen NSVT (nonsustained ventricular tachycardia) ([place])   . Osteoporosis     SURGICAL HISTORY: Past Surgical History:  Procedure Laterality Date  . ABDOMINAL HYSTERECTOMY    . BREAST BIOPSY Left 4/15`   BENIGN BREAST EPITHELIUM WITH NODULAR FIBROSIS.  Marland Kitchen INGUINAL HERNIA REPAIR Left 05/04/2016   Procedure: HERNIA REPAIR INGUINAL ADULT;  Surgeon: Robert Bellow, MD;  Location: [facility] ORS;  Service: General;  Laterality: Left;  . THYROIDECTOMY, PARTIAL  1956  . TUBAL LIGATION      SOCIAL HISTORY: Social History   Socioeconomic History  . Marital status: Widowed    Spouse name: Not on file  . Number of children: Not on file  . Years of education: Not on file  . Highest education level: Not on file  Occupational History  . Not on file  Tobacco Use  . Smoking status: Never Smoker  . Smokeless tobacco: Never Used  Vaping Use  . Vaping Use: Never  used  Substance and Sexual Activity  . Alcohol use: No    Alcohol/week: 0.0 standard drinks  . Drug use: No  . Sexual activity: Not on file  Other Topics Concern  . Not on file  Social History Narrative  . Not on file   Social Determinants of Health   Financial Resource Strain:    . Difficulty of Paying Living Expenses:   Food Insecurity:   . Worried About Charity fundraiser in the Last Year:   . Arboriculturist in the Last Year:   Transportation Needs:   . Film/video editor (Medical):   Marland Kitchen Lack of Transportation (Non-Medical):   Physical Activity:   . Days of Exercise per Week:   . Minutes of Exercise per Session:   Stress:   . Feeling of Stress :   Social Connections:   . Frequency of Communication with Friends and Family:   . Frequency of Social Gatherings with Friends and Family:   . Attends Religious Services:   . Active Member of Clubs or Organizations:   . Attends Archivist Meetings:   Marland Kitchen Marital Status:   Intimate Partner Violence:   . Fear of Current or Ex-Partner:   . Emotionally Abused:   Marland Kitchen Physically Abused:   . Sexually Abused:     FAMILY HISTORY: Family History  Problem Relation Age of Onset  . Stroke Mother   . Colon cancer Father   . Breast cancer Daughter 26  . Breast cancer Cousin     ALLERGIES:  has No Known Allergies.  MEDICATIONS:  Current Outpatient Medications  Medication Sig Dispense Refill  . acetaminophen (TYLENOL) 325 MG tablet Take 650 mg by mouth every 6 (six) hours as needed.    Marland Kitchen albuterol (VENTOLIN HFA) 108 (90 Base) MCG/ACT inhaler Inhale 2 puffs into the lungs every 6 (six) hours as needed for wheezing or shortness of breath. 18 g 1  . aspirin (ASPIRIN EC) 81 MG EC tablet Take 81 mg by mouth daily. Swallow whole.    . Cholecalciferol (VITAMIN D-3) 1000 UNITS CAPS Take 2,000 Units by mouth daily.    . furosemide (LASIX) 40 MG tablet Take 1/2 tablet-1 tablet as directed per weight gain. 30 tablet 3  . isosorbide mononitrate (IMDUR) 30 MG 24 hr tablet Take 1 tablet (30 mg total) by mouth daily. 30 tablet 9  . metoprolol succinate (TOPROL-XL) 25 MG 24 hr tablet Take 1 tablet (25 mg total) by mouth daily. 90 tablet 3  . rosuvastatin (CRESTOR) 5 MG tablet Take 1 tablet (5 mg total) by mouth daily. 90  tablet 3  . sacubitril-valsartan (ENTRESTO) 97-103 MG Take 1 tablet by mouth 2 (two) times daily. 60 tablet 11  . senna-docusate (SENOKOT-S) 8.6-50 MG tablet Take 2 tablets by mouth 2 (two) times daily as needed for mild constipation. 60 tablet 0  . spironolactone (ALDACTONE) 25 MG tablet Take 1 tablet (25 mg total) by mouth daily. 90 tablet 3   No current facility-administered medications for this visit.      Marland Kitchen  PHYSICAL EXAMINATION: ECOG PERFORMANCE STATUS: 0 - Asymptomatic  Vitals:   08/27/19 1358  BP: 124/76  Pulse: 73  Resp: 16  Temp: 98.5 F (36.9 C)  SpO2: 94%   Filed Weights   08/27/19 1358  Weight: 129 lb (58.5 kg)    Physical Exam Constitutional:      Comments: Accompanied by son.  Patient appears younger than her stated age.  HENT:  Head: Normocephalic and atraumatic.     Mouth/Throat:     Pharynx: No oropharyngeal exudate.  Eyes:     Pupils: Pupils are equal, round, and reactive to light.  Cardiovascular:     Rate and Rhythm: Normal rate and regular rhythm.  Pulmonary:     Effort: No respiratory distress.     Breath sounds: No wheezing.  Abdominal:     General: Bowel sounds are normal. There is no distension.     Palpations: Abdomen is soft. There is no mass.     Tenderness: There is no abdominal tenderness. There is no guarding or rebound.  Musculoskeletal:        General: No tenderness. Normal range of motion.     Cervical back: Normal range of motion and neck supple.  Skin:    General: Skin is warm.  Neurological:     Mental Status: She is alert and oriented to person, place, and time.  Psychiatric:        Mood and Affect: Affect normal.    Results for Kathy, Dominguez (MRN [id number]) as of 09/02/2019 20:12  Ref. Range 03/21/2017 08:23 08/30/2017 12:40 05/03/2019 08:19 06/07/2019 09:45 08/09/2019 08:06  CEA Latest Units: ng/mL 2.6 (H) 2.2 3.1 (H) 3.1 (H) 3.4 (H)     LABORATORY DATA:  I have reviewed the data as listed Lab Results   Component Value Date   WBC 6.9 06/29/2019   HGB 12.6 06/29/2019   HCT 38.8 06/29/2019   MCV 91.3 06/29/2019   PLT 214 06/29/2019   Recent Labs    12/06/18 0821 12/15/18 1124 05/03/19 0819 05/03/19 0819 06/28/19 0341 06/28/19 0341 06/29/19 0420 08/09/19 0806 08/13/19 1628  NA 141   < > 140   < > 140   < > 141 142 141  K 4.4   < > 4.2   < > 4.0   < > 4.3 4.8 4.6  CL 100   < > 102   < > 102   < > 104 103 102  CO2 32   < > 32   < > 27   < > 28 33* 33*  GLUCOSE 105*   < > 101*   < > 142*   < > 106* 115* 98  BUN 34*   < > 25*   < > 29*   < > 29* 24* 22  CREATININE 1.48*   < > 1.19   < > 1.13*   < > 0.99 1.24* 1.11  CALCIUM 9.4   < > 9.2   < > 9.5   < > 9.0 9.7 9.8  GFRNONAA  --   --   --   --  43*  --  51*  --   --   GFRAA  --   --   --   --  50*  --  59*  --   --   PROT 7.0  --  6.8  --  7.3  --   --  7.2  --   ALBUMIN 4.4  --  4.2  --  4.3  --   --  4.5  --   AST 16  --  17  --  21  --   --  16  --   ALT 9  --  10  --  13  --   --  9  --   ALKPHOS 61  --  55  --  49  --   --  52  --   BILITOT 0.5  --  0.5  --  0.7  --   --  0.4  --   BILIDIR 0.1  --  0.1  --   --   --   --  0.1  --    < > = values in this interval not displayed.    RADIOGRAPHIC STUDIES: I have personally reviewed the radiological images as listed and agreed with the findings in the report. No results found.  ASSESSMENT & PLAN:   History of colon cancer # Hx of colon cancer-stage I [2005]/ slightly elevated CEA-clinically no concerns for any progressive disease/recurrent disease.  Discussed options with the patient/family regarding-surveillance versus imaging with a PET scan [CT scan] vs. repeat colonoscopy. [Colonoscopy May 2014-diverticulosis; negative otherwise].  After lengthy discussion patient/family interested in proceeding with PET scan for further evaluation.  I think is reasonable.  # CHF/CKD- stage III  Thank you Dr.Scott for allowing me to participate in the care of your pleasant patient.  Please do not hesitate to contact me with questions or concerns in the interim.  I also spoke to patient's family at length/and also patient's family friend is a Engineer, drilling.  In agreement with the plan  # DISPOSITION: will call with results- [phone]/cell   # PET scan 1 week # Follow up TBD- Dr. B    All questions were answered. The patient knows to call the clinic with any problems, questions or concerns.    Cammie Sickle, MD 09/02/2019 8:13 PM

## 2019-08-27 NOTE — Assessment & Plan Note (Addendum)
#   Hx of colon cancer-stage I [2005]/ slightly elevated CEA-clinically no concerns for any progressive disease/recurrent disease.  Discussed options with the patient/family regarding-surveillance versus imaging with a PET scan [CT scan] vs. repeat colonoscopy. [Colonoscopy May 2014-diverticulosis; negative otherwise].  After lengthy discussion patient/family interested in proceeding with PET scan for further evaluation.  I think is reasonable.  # CHF/CKD- stage III  Thank you Dr.Scott for allowing me to participate in the care of your pleasant patient. Please do not hesitate to contact me with questions or concerns in the interim.  I also spoke to patient's family at length/and also patient's family friend is a Engineer, drilling.  In agreement with the plan  # DISPOSITION: will call with results- [phone]/cell   # PET scan 1 week # Follow up TBD- Dr. Jacinto Reap  Addendum: PET scan August 2021-negative for any concern for recurrence.  CEA likely false positive; not a true marker of recurrent malignancy.  Reviewed the incidental findings of atherosclerosis liver cysts; L4 sclerosis.  CEA unfortunately not a reliable marker.  Recommend follow-up with PCP.  Discussed with patient.

## 2019-09-05 ENCOUNTER — Ambulatory Visit: Payer: Medicare HMO

## 2019-09-05 ENCOUNTER — Telehealth: Payer: Self-pay | Admitting: *Deleted

## 2019-09-05 NOTE — Telephone Encounter (Signed)
Per msg from PET CT "We had Kathy Dominguez scheduled for a PET scan today and I just wanted to let you know that she did not show up for this appointment."

## 2019-09-05 NOTE — Telephone Encounter (Signed)
Left vm x 2 on patient's phone. Colette was able to contact pt's daughter re: no show. Daughter agreeable to r/s the pet scan.

## 2019-09-13 ENCOUNTER — Other Ambulatory Visit: Payer: Self-pay

## 2019-09-13 ENCOUNTER — Encounter
Admission: RE | Admit: 2019-09-13 | Discharge: 2019-09-13 | Disposition: A | Discharge status: Home / Self Care | Payer: Medicare HMO | Source: Ambulatory Visit | Attending: Internal Medicine | Admitting: Internal Medicine

## 2019-09-13 DIAGNOSIS — I2510 Atherosclerotic heart disease of native coronary artery without angina pectoris: Secondary | ICD-10-CM | POA: Diagnosis not present

## 2019-09-13 DIAGNOSIS — Z85038 Personal history of other malignant neoplasm of large intestine: Secondary | ICD-10-CM | POA: Insufficient documentation

## 2019-09-13 DIAGNOSIS — R978 Other abnormal tumor markers: Secondary | ICD-10-CM | POA: Insufficient documentation

## 2019-09-13 LAB — GLUCOSE, CAPILLARY: Glucose-Capillary: 104 mg/dL — ABNORMAL HIGH (ref 70–99)

## 2019-09-13 MED ORDER — FLUDEOXYGLUCOSE F - 18 (FDG) INJECTION
6.7000 | Freq: Once | INTRAVENOUS | Status: AC | PRN
  Administered 2019-09-13: 6.7 via INTRAVENOUS

## 2019-09-18 ENCOUNTER — Telehealth: Payer: Self-pay | Admitting: Internal Medicine

## 2019-09-18 NOTE — Telephone Encounter (Signed)
PET scan August 2021-negative for any concern for recurrence.  CEA likely false positive; not a true marker of recurrent malignancy.  Reviewed the incidental findings of atherosclerosis liver cysts; L4 sclerosis.  CEA unfortunately not a reliable marker.  Recommend follow-up with PCP; follow-up as needed with [place].  Discussed with the patient in agreement.  FYI-Dr.Scott.

## 2019-10-19 ENCOUNTER — Other Ambulatory Visit: Payer: Self-pay | Admitting: Internal Medicine

## 2019-10-19 DIAGNOSIS — Z1231 Encounter for screening mammogram for malignant neoplasm of breast: Secondary | ICD-10-CM

## 2019-11-23 ENCOUNTER — Encounter: Payer: Self-pay | Admitting: Emergency Medicine

## 2019-11-23 ENCOUNTER — Emergency Department
Admission: EM | Admit: 2019-11-23 | Discharge: 2019-11-23 | Disposition: A | Discharge status: Home / Self Care | Payer: Medicare HMO | Attending: Emergency Medicine | Admitting: Emergency Medicine

## 2019-11-23 ENCOUNTER — Other Ambulatory Visit: Payer: Self-pay

## 2019-11-23 ENCOUNTER — Emergency Department: Payer: Medicare HMO

## 2019-11-23 ENCOUNTER — Telehealth: Payer: Self-pay | Admitting: Internal Medicine

## 2019-11-23 ENCOUNTER — Ambulatory Visit
Admission: EM | Admit: 2019-11-23 | Discharge: 2019-11-23 | Disposition: A | Discharge status: Home / Self Care | Payer: Medicare HMO | Source: Home / Self Care

## 2019-11-23 DIAGNOSIS — J45909 Unspecified asthma, uncomplicated: Secondary | ICD-10-CM | POA: Insufficient documentation

## 2019-11-23 DIAGNOSIS — R519 Headache, unspecified: Secondary | ICD-10-CM | POA: Diagnosis not present

## 2019-11-23 DIAGNOSIS — Z20822 Contact with and (suspected) exposure to covid-19: Secondary | ICD-10-CM | POA: Insufficient documentation

## 2019-11-23 DIAGNOSIS — R29810 Facial weakness: Secondary | ICD-10-CM | POA: Insufficient documentation

## 2019-11-23 DIAGNOSIS — N189 Chronic kidney disease, unspecified: Secondary | ICD-10-CM | POA: Insufficient documentation

## 2019-11-23 DIAGNOSIS — Z7982 Long term (current) use of aspirin: Secondary | ICD-10-CM | POA: Diagnosis not present

## 2019-11-23 DIAGNOSIS — Z79899 Other long term (current) drug therapy: Secondary | ICD-10-CM | POA: Insufficient documentation

## 2019-11-23 DIAGNOSIS — R911 Solitary pulmonary nodule: Secondary | ICD-10-CM | POA: Insufficient documentation

## 2019-11-23 DIAGNOSIS — R0602 Shortness of breath: Secondary | ICD-10-CM | POA: Diagnosis not present

## 2019-11-23 DIAGNOSIS — R42 Dizziness and giddiness: Secondary | ICD-10-CM | POA: Insufficient documentation

## 2019-11-23 DIAGNOSIS — I130 Hypertensive heart and chronic kidney disease with heart failure and stage 1 through stage 4 chronic kidney disease, or unspecified chronic kidney disease: Secondary | ICD-10-CM | POA: Diagnosis not present

## 2019-11-23 DIAGNOSIS — R030 Elevated blood-pressure reading, without diagnosis of hypertension: Secondary | ICD-10-CM | POA: Insufficient documentation

## 2019-11-23 DIAGNOSIS — R531 Weakness: Secondary | ICD-10-CM | POA: Insufficient documentation

## 2019-11-23 DIAGNOSIS — I10 Essential (primary) hypertension: Secondary | ICD-10-CM

## 2019-11-23 DIAGNOSIS — I5022 Chronic systolic (congestive) heart failure: Secondary | ICD-10-CM | POA: Insufficient documentation

## 2019-11-23 LAB — FIBRIN DERIVATIVES D-DIMER (ARMC ONLY): Fibrin derivatives D-dimer (ARMC): 2462.09 ng/mL (FEU) — ABNORMAL HIGH (ref 0.00–499.00)

## 2019-11-23 LAB — CBC
HCT: 34.7 % — ABNORMAL LOW (ref 36.0–46.0)
Hemoglobin: 11.4 g/dL — ABNORMAL LOW (ref 12.0–15.0)
MCH: 30 pg (ref 26.0–34.0)
MCHC: 32.9 g/dL (ref 30.0–36.0)
MCV: 91.3 fL (ref 80.0–100.0)
Platelets: 180 10*3/uL (ref 150–400)
RBC: 3.8 MIL/uL — ABNORMAL LOW (ref 3.87–5.11)
RDW: 12.7 % (ref 11.5–15.5)
WBC: 3.9 10*3/uL — ABNORMAL LOW (ref 4.0–10.5)
nRBC: 0 % (ref 0.0–0.2)

## 2019-11-23 LAB — BASIC METABOLIC PANEL
Anion gap: 12 (ref 5–15)
BUN: 24 mg/dL — ABNORMAL HIGH (ref 8–23)
CO2: 26 mmol/L (ref 22–32)
Calcium: 9.2 mg/dL (ref 8.9–10.3)
Chloride: 100 mmol/L (ref 98–111)
Creatinine, Ser: 1.03 mg/dL — ABNORMAL HIGH (ref 0.44–1.00)
GFR, Estimated: 52 mL/min — ABNORMAL LOW (ref >60)
Glucose, Bld: 96 mg/dL (ref 70–99)
Potassium: 4.2 mmol/L (ref 3.5–5.1)
Sodium: 138 mmol/L (ref 135–145)

## 2019-11-23 LAB — TROPONIN I (HIGH SENSITIVITY)
Troponin I (High Sensitivity): <2 ng/L (ref <18)
Troponin I (High Sensitivity): <2 ng/L (ref <18)

## 2019-11-23 LAB — BRAIN NATRIURETIC PEPTIDE: B Natriuretic Peptide: 98.9 pg/mL (ref 0.0–100.0)

## 2019-11-23 LAB — RESPIRATORY PANEL BY RT PCR (FLU A&B, COVID)
Influenza A by PCR: NEGATIVE
Influenza B by PCR: NEGATIVE
SARS Coronavirus 2 by RT PCR: NEGATIVE

## 2019-11-23 LAB — GLUCOSE, CAPILLARY: Glucose-Capillary: 100 mg/dL — ABNORMAL HIGH (ref 70–99)

## 2019-11-23 MED ORDER — IOHEXOL 350 MG/ML SOLN
75.0000 mL | Freq: Once | INTRAVENOUS | Status: AC | PRN
  Administered 2019-11-23: 75 mL via INTRAVENOUS

## 2019-11-23 NOTE — Discharge Instructions (Addendum)
Your Brain MRI Showed: No acute intracranial process.  Remote right cerebellar insult. Mild cerebral atrophy. Advanced chronic microvascular ischemic changes. Remote right frontal and right cerebellar microhemorrhages. Your chest CT Showed: 1. No acute intrathoracic pathology. No CT evidence of pulmonary embolism. 2. A 5 mm right apical nodule, not significantly changed since 2008.

## 2019-11-23 NOTE — Telephone Encounter (Signed)
Patient called in and is complaining of being dizzy for the past 2 days. Pt sent to [facility] for triage.

## 2019-11-23 NOTE — Telephone Encounter (Signed)
Noted  

## 2019-11-23 NOTE — Discharge Instructions (Addendum)
Go directly to emergency room.  

## 2019-11-23 NOTE — Telephone Encounter (Signed)
Spoken to patient, she stated she is very unsteady on her feet and need assistance walking for the past two days. She is very dizzy/lightheaded, worse upon standing and moving. She has no SOB, Blurry Vision, Arm px, Chest px, Heart Palpitation, Headache, slurred speech, facial droop, and or Jaw pain. She does feel better today than yesterday, but still over all feeling of dizziness/lightheadedness. Due to patients past medical history I instructed patient to go to UC. She also has appointment with PCP two weeks after today, for follow up. Patients son stated he is going to take her to [facility] in [place].

## 2019-11-23 NOTE — ED Provider Notes (Signed)
[facility] Emergency Department Provider Note  ____________________________________________   First MD Initiated Contact with Patient 11/23/19 1554     (approximate)  I have reviewed the triage vital signs and the nursing notes.   HISTORY  Chief Complaint Dizziness and Headache   HPI Kathy Dominguez is a 86 y.o. female with a past medical history of advanced heart failure, HTN, osteoporosis, and asthma who presents for assessment after being referred to the ED by urgent care for further evaluation of dizziness.  Patient states the episode of dizziness began in the evening 2 days ago and persisted until today when it seemed to get better.  She states she went to urgent care because she was not sure what caused it.  No prior similar episodes.  No clear precipitating factors.  Patient also states she thinks she has been slightly more winded than usual the last couple days.  No significant weight gain, chest pain, cough, fevers, chills, headache, vision changes, abdominal pain, back pain, rash, urinary symptoms, diarrhea, extremity pain, recent falls or injuries.  Patient denies EtOH use, illicit drug use, tobacco abuse.         Past Medical History:  Diagnosis Date  . Asthma   . Chronic systolic CHF (congestive heart failure) ([place])    a. 01/2015 Echo: EF 25-30%, sev diff HK, mild to mod MR, mildly dil LA, nl RV fxn, PASP 61 mmHg.  . Colon cancer ([place]) 2005  . Hypercholesterolemia   . Hypertension   . Hypertensive heart disease with CHF (congestive heart failure) ([place])   . NICM (nonischemic cardiomyopathy) ([place])    a. 01/2015 Echo: EF 25-30% sev diff HK;  b. 01/2015 Lexi MV: EF 19%, small defect of mild severity in apex - likely breast attenuation, no ischemia.  Marland Kitchen NSVT (nonsustained ventricular tachycardia) ([place])   . Osteoporosis     Patient Active Problem List   Diagnosis Date Noted  . Partial small bowel obstruction ([place]) 06/28/2019  . CKD (chronic  kidney disease) 06/28/2019  . Hyperglycemia 11/24/2018  . NICM (nonischemic cardiomyopathy) ([place]) 12/19/2017  . Inguinal hernia of left side with obstruction   . Chronic systolic heart failure ([place]) 02/16/2016  . Hypotension 02/16/2016  . Hypertensive heart disease with CHF (congestive heart failure) ([place]) 01/30/2016  . NSVT (nonsustained ventricular tachycardia) ([place]) 01/30/2016  . Hypokalemia 01/30/2016  . Abnormal liver function tests 12/04/2015  . Right knee pain 06/25/2015  . Right leg pain 02/24/2015  . Health care maintenance 06/13/2014  . Leg skin lesion, right 06/24/2013  . Lump or mass in breast 05/21/2013  . Leukopenia 02/23/2012  . History of colon cancer 02/23/2012  . Asthma 02/23/2012  . Essential hypertension 02/22/2012  . Hypercholesterolemia 02/22/2012    Past Surgical History:  Procedure Laterality Date  . ABDOMINAL HYSTERECTOMY    . BREAST BIOPSY Left 4/15`   BENIGN BREAST EPITHELIUM WITH NODULAR FIBROSIS.  Marland Kitchen INGUINAL HERNIA REPAIR Left 05/04/2016   Procedure: HERNIA REPAIR INGUINAL ADULT;  Surgeon: Robert Bellow, MD;  Location: [facility] ORS;  Service: General;  Laterality: Left;  . THYROIDECTOMY, PARTIAL  1956  . TUBAL LIGATION      Prior to Admission medications   Medication Sig Start Date End Date Taking? Authorizing Provider  acetaminophen (TYLENOL) 325 MG tablet Take 650 mg by mouth every 6 (six) hours as needed.    [provider]  albuterol (VENTOLIN HFA) 108 (90 Base) MCG/ACT inhaler Inhale 2 puffs into the lungs every 6 (six)  hours as needed for wheezing or shortness of breath. 04/05/19   Einar Pheasant, MD  aspirin (ASPIRIN EC) 81 MG EC tablet Take 81 mg by mouth daily. Swallow whole.    [provider]  Cholecalciferol (VITAMIN D-3) 1000 UNITS CAPS Take 2,000 Units by mouth daily.    [provider]  furosemide (LASIX) 40 MG tablet Take 1/2 tablet-1 tablet as directed per weight gain. 06/26/19   Minna Merritts, MD    isosorbide mononitrate (IMDUR) 30 MG 24 hr tablet Take 1 tablet (30 mg total) by mouth daily. 05/17/19   Minna Merritts, MD  metoprolol succinate (TOPROL-XL) 25 MG 24 hr tablet Take 1 tablet (25 mg total) by mouth daily. 03/28/19   Minna Merritts, MD  rosuvastatin (CRESTOR) 5 MG tablet Take 1 tablet (5 mg total) by mouth daily. 03/28/19   Minna Merritts, MD  sacubitril-valsartan (ENTRESTO) 97-103 MG Take 1 tablet by mouth 2 (two) times daily. 03/28/19   Minna Merritts, MD  senna-docusate (SENOKOT-S) 8.6-50 MG tablet Take 2 tablets by mouth 2 (two) times daily as needed for mild constipation. 06/29/19   Sharen Hones, MD  spironolactone (ALDACTONE) 25 MG tablet Take 1 tablet (25 mg total) by mouth daily. 03/28/19   Minna Merritts, MD    Allergies Patient has no known allergies.  Family History  Problem Relation Age of Onset  . Stroke Mother   . Colon cancer Father   . Breast cancer Daughter 84  . Breast cancer Cousin     Social History Social History   Tobacco Use  . Smoking status: Never Smoker  . Smokeless tobacco: Never Used  Vaping Use  . Vaping Use: Never used  Substance Use Topics  . Alcohol use: No    Alcohol/week: 0.0 standard drinks  . Drug use: No    Review of Systems  Review of Systems  Constitutional: Negative for chills and fever.  HENT: Negative for sore throat.   Eyes: Negative for pain.  Respiratory: Positive for shortness of breath. Negative for cough and stridor.   Cardiovascular: Negative for chest pain.  Gastrointestinal: Negative for vomiting.  Genitourinary: Negative for dysuria.  Musculoskeletal: Negative for myalgias.  Skin: Negative for rash.  Neurological: Positive for dizziness. Negative for seizures, loss of consciousness and headaches.  Psychiatric/Behavioral: Negative for suicidal ideas.  All other systems reviewed and are negative.     ____________________________________________   PHYSICAL EXAM:  VITAL SIGNS: ED Triage  Vitals  Enc Vitals Group     BP 11/23/19 1433 (!) 154/80     Pulse Rate 11/23/19 1433 71     Resp 11/23/19 1433 20     Temp 11/23/19 1433 98.4 F (36.9 C)     Temp Source 11/23/19 1433 Oral     SpO2 11/23/19 1433 97 %     Weight --      Height --      Head Circumference --      Peak Flow --      Pain Score 11/23/19 1435 0     Pain Loc --      Pain Edu? --      Excl. in [place]? --    Vitals:   11/23/19 1433  BP: (!) 154/80  Pulse: 71  Resp: 20  Temp: 98.4 F (36.9 C)  SpO2: 97%   Physical Exam Vitals and nursing note reviewed.  Constitutional:      General: She is not in acute distress.  Appearance: She is well-developed.  HENT:     Head: Normocephalic and atraumatic.     Right Ear: External ear normal.     Left Ear: External ear normal.     Nose: Nose normal.     Mouth/Throat:     Mouth: Mucous membranes are moist.  Eyes:     Conjunctiva/sclera: Conjunctivae normal.  Cardiovascular:     Rate and Rhythm: Normal rate and regular rhythm.     Heart sounds: No murmur heard.   Pulmonary:     Effort: Pulmonary effort is normal. No respiratory distress.     Breath sounds: Normal breath sounds.  Abdominal:     Palpations: Abdomen is soft.     Tenderness: There is no abdominal tenderness.  Musculoskeletal:     Cervical back: Neck supple.     Right lower leg: No edema.     Left lower leg: No edema.  Skin:    General: Skin is warm and dry.     Capillary Refill: Capillary refill takes less than 2 seconds.  Neurological:     Mental Status: She is alert and oriented to person, place, and time.  Psychiatric:        Mood and Affect: Mood normal.     No pronator drift.  No finger dysmetria.  Cranial nerves II through XII grossly intact.  Patient has full and symmetric strength of all extremities.  She is able to ambulate without evidence of ataxia and no assistance. ____________________________________________   LABS (all labs ordered are listed, but only abnormal  results are displayed)  Labs Reviewed  BASIC METABOLIC PANEL - Abnormal; Notable for the following components:      Result Value   BUN 24 (*)    Creatinine, Ser 1.03 (*)    GFR, Estimated 52 (*)    All other components within normal limits  CBC - Abnormal; Notable for the following components:   WBC 3.9 (*)    RBC 3.80 (*)    Hemoglobin 11.4 (*)    HCT 34.7 (*)    All other components within normal limits  FIBRIN DERIVATIVES D-DIMER ([facility] ONLY) - Abnormal; Notable for the following components:   Fibrin derivatives D-dimer [facility]) 2,462.09 (*)    All other components within normal limits  RESPIRATORY PANEL BY RT PCR (FLU A&B, COVID)  BRAIN NATRIURETIC PEPTIDE  TROPONIN I (HIGH SENSITIVITY)  TROPONIN I (HIGH SENSITIVITY)   ____________________________________________  EKG  Sinus rhythm with a ventricular rate of 69, artifact in lead I, lead II, and V3 without clear evidence of acute ischemia.  Unremarkable intervals. ____________________________________________  RADIOLOGY  ED MD interpretation: No acute hemorrhage, intracranial mass-effect, mass, or evidence of subacute stroke.  Evidence of chronic microvascular ischemic changes.  Official radiology report(s): DG Chest 2 View  Result Date: 11/23/2019 CLINICAL DATA:  CHF, dizziness, headache, LEFT leg weakness EXAM: CHEST - 2 VIEW COMPARISON:  08/23/2017 FINDINGS: Normal heart size and pulmonary vascularity. Atherosclerotic calcification and more tortuosity of thoracic aorta. Lungs clear. No pulmonary infiltrate, pleural effusion, or pneumothorax. Osseous structures unremarkable. IMPRESSION: Markedly tortuous aorta. No acute abnormalities. Electronically Signed   By: Lavonia Dana M.D.   On: 11/23/2019 16:47   CT Head Wo Contrast  Result Date: 11/23/2019 CLINICAL DATA:  Dizziness EXAM: CT HEAD WITHOUT CONTRAST TECHNIQUE: Contiguous axial images were obtained from the base of the skull through the vertex without intravenous  contrast. COMPARISON:  None. FINDINGS: Brain: No evidence of acute infarction, hemorrhage, hydrocephalus, extra-axial collection or  mass lesion/mass effect. Encephalomalacia within the right cerebellar lobe suggesting remote ischemia. Mild-moderate low-density changes within the periventricular and subcortical white matter compatible with chronic microvascular ischemic change. Mild diffuse cerebral volume loss. Vascular: Atherosclerotic calcifications involving the large vessels of the skull base. No unexpected hyperdense vessel. Skull: Normal. Negative for fracture or focal lesion. Sinuses/Orbits: No acute finding. Other: None. IMPRESSION: 1. No acute intracranial findings. 2. Chronic microvascular ischemic change and cerebral volume loss. Electronically Signed   By: Davina Poke D.O.   On: 11/23/2019 15:17   CT Angio Chest PE W and/or Wo Contrast  Result Date: 11/23/2019 CLINICAL DATA:  86 year old female with concern for pulmonary embolism. EXAM: CT ANGIOGRAPHY CHEST WITH CONTRAST TECHNIQUE: Multidetector CT imaging of the chest was performed using the standard protocol during bolus administration of intravenous contrast. Multiplanar CT image reconstructions and MIPs were obtained to evaluate the vascular anatomy. CONTRAST:  64mL OMNIPAQUE IOHEXOL 350 MG/ML SOLN COMPARISON:  Chest CT dated 01/25/2007 and radiograph dated 11/23/2019. FINDINGS: Cardiovascular: There is no cardiomegaly or pericardial effusion. Coronary vascular calcifications noted. Mild atherosclerotic calcification of the thoracic aorta. No aneurysmal dilatation or dissection. The aorta is tortuous. The origins of the great vessels of the aortic arch appear patent as visualized. There is no CT evidence of pulmonary embolism. Mediastinum/Nodes: No hilar or mediastinal adenopathy. The esophagus is grossly unremarkable. No mediastinal fluid collection. Lungs/Pleura: There is a 5 mm right apical nodule without significant change compared to  the study of 2008. There are bibasilar linear atelectasis/scarring. No focal consolidation, pleural effusion, pneumothorax. The central airways are patent. Upper Abdomen: No acute abnormality. Musculoskeletal: Osteopenia with degenerative changes of the spine. No acute osseous pathology. There is mild diffuse subcutaneous edema. No fluid collection. Review of the MIP images confirms the above findings. IMPRESSION: 1. No acute intrathoracic pathology. No CT evidence of pulmonary embolism. 2. A 5 mm right apical nodule, not significantly changed since 2008. Electronically Signed   By: Anner [place] M.D.   On: 11/23/2019 19:41   MR BRAIN WO CONTRAST  Result Date: 11/23/2019 CLINICAL DATA:  Dizziness, non-specific EXAM: MRI HEAD WITHOUT CONTRAST TECHNIQUE: Multiplanar, multiecho pulse sequences of the brain and surrounding structures were obtained without intravenous contrast. COMPARISON:  11/23/2019 head CT. FINDINGS: Brain: No diffusion-weighted signal abnormality. No acute intracranial hemorrhage. No midline shift, ventriculomegaly or extra-axial fluid collection. No mass lesion. Sequela of remote right cerebellar insult. Remote right cerebellar and right frontal microhemorrhages. Mild cerebral atrophy with ex vacuo dilatation. Advanced chronic microvascular ischemic changes. Vascular: Normal flow voids. Skull and upper cervical spine: Normal marrow signal. Sinuses/Orbits: Sequela of bilateral lens replacement. Trace right mastoid effusion. Clear paranasal sinuses. Other: None. IMPRESSION: No acute intracranial process.  Remote right cerebellar insult. Mild cerebral atrophy. Advanced chronic microvascular ischemic changes. Remote right frontal and right cerebellar microhemorrhages. Electronically Signed   By: Primitivo Gauze M.D.   On: 11/23/2019 17:25    ____________________________________________   PROCEDURES  Procedure(s) performed (including Critical Care):  .1-3 Lead EKG  Interpretation Performed by: Lucrezia Starch, MD Authorized by: Lucrezia Starch, MD     Interpretation: normal     ECG rate assessment: normal     Rhythm: sinus rhythm     Ectopy: none     Conduction: normal       ____________________________________________   INITIAL IMPRESSION / ASSESSMENT AND PLAN / ED COURSE        Patient presents with [place] to history exam for assessment of 2 days of  dizziness that resolved prior to presentation emergency room as well as some shortness of breath of the last couple of days.  On arrival patient is afebrile with a BP of 154/80 otherwise stable vital signs on room air.  With regard to patient's dizziness differential includes but is not limited to peripheral vertigo, CVA, metabolic derangements, anemia, and dehydration.  While no evidence of infarct on CT given clinical history of vertigo and ataxia MRI was obtained.  This shows evidence of remote infarcts and remote parameters but no evidence of acute or subacute infarct.  Given patient is currently asymptomatic from dizziness perspective seems more likely given no acute findings on MRI that her symptoms are related to peripheral etiology.  She has no significant metabolic derangements on her BMP and she does not appear to be significantly anemic or dehydrated.    With regard to patient's dyspnea last several days differential includes but is not limited to decompensated heart failure, ACS, arrhythmia, anemia, metabolic derangements, pneumonia, symptomatic effusion, and PE.  Low suspicion for ACS as patient denies any chest pain and has had a reassuring EKG with 2 nonelevated troponin obtained over 2 hours.  Chest x-ray shows evidence of pneumonia, pneumothorax, large effusion, or significant volume overload.  In addition her BNP is not elevated.  CTA chest obtained elevated dimer shows no evidence of PE or other acute intrathoracic process.  Unclear etiology for patient's dyspnea although given  stable vital signs with reassuring exam work-up and wishes safe for discharge plan for continued outpatient evaluation and management.  Patient discharged stable condition per strict return precautions advised discussed.  ____________________________________________   FINAL CLINICAL IMPRESSION(S) / ED DIAGNOSES  Final diagnoses:  Hypertension, unspecified type  Dizziness  SOB (shortness of breath)  Lung nodule    Medications  iohexol (OMNIPAQUE) 350 MG/ML injection 75 mL (75 mLs Intravenous Contrast Given 11/23/19 1927)     ED Discharge Orders    None       Note:  This document was prepared using Dragon voice recognition software and may include unintentional dictation errors.   Lucrezia Starch, MD 11/23/19 1950

## 2019-11-23 NOTE — ED Provider Notes (Signed)
[facility] ____________________________________________  Time seen: Approximately 1:37 PM  I have reviewed the triage vital signs and the nursing notes.   HISTORY  Chief Complaint Dizziness   HPI Kathy Dominguez is a 86 y.o. female has medical history of CHF, hypertension, cardiomyopathy, SVT presenting with son at bedside for evaluation of dizziness.  Patient reports since Wednesday night she has been having dizziness.  States initial onset was at rest.  States it is caused her to feel off balance and having to hold onto items to walk.  States today it is slightly better than it was for the last 2 days without alleviating measures.  Denies changes with movement.  Denies acute vision changes.  Denies unilateral paresthesias, chest pain, shortness of breath.  States her right lower leg does feel slightly weak, but reports this is chronic due to right knee issues.  Some nasal congestion.  Denies fevers.  Urinary frequency in the last 2 days as well.  Has had some mild frontal headache.  Has continued to eat and drink well.  Past Medical History:  Diagnosis Date   Asthma    Chronic systolic CHF (congestive heart failure) ([place])    a. 01/2015 Echo: EF 25-30%, sev diff HK, mild to mod MR, mildly dil LA, nl RV fxn, PASP 61 mmHg.   Colon cancer ([place]) 2005   Hypercholesterolemia    Hypertension    Hypertensive heart disease with CHF (congestive heart failure) ([place])    NICM (nonischemic cardiomyopathy) ([place])    a. 01/2015 Echo: EF 25-30% sev diff HK;  b. 01/2015 Lexi MV: EF 19%, small defect of mild severity in apex - likely breast attenuation, no ischemia.   NSVT (nonsustained ventricular tachycardia) ([facility])    Osteoporosis     Patient Active Problem List   Diagnosis Date Noted   Partial small bowel obstruction ([place]) 06/28/2019   CKD (chronic kidney disease) 06/28/2019   Hyperglycemia 11/24/2018   NICM (nonischemic cardiomyopathy) ([place]) 12/19/2017   Inguinal  hernia of left side with obstruction    Chronic systolic heart failure ([place]) 02/16/2016   Hypotension 02/16/2016   Hypertensive heart disease with CHF (congestive heart failure) ([place]) 01/30/2016   NSVT (nonsustained ventricular tachycardia) ([place]) 01/30/2016   Hypokalemia 01/30/2016   Abnormal liver function tests 12/04/2015   Right knee pain 06/25/2015   Right leg pain 02/24/2015   Health care maintenance 06/13/2014   Leg skin lesion, right 06/24/2013   Lump or mass in breast 05/21/2013   Leukopenia 02/23/2012   History of colon cancer 02/23/2012   Asthma 02/23/2012   Essential hypertension 02/22/2012   Hypercholesterolemia 02/22/2012    Past Surgical History:  Procedure Laterality Date   ABDOMINAL HYSTERECTOMY     BREAST BIOPSY Left 4/15`   BENIGN BREAST EPITHELIUM WITH NODULAR FIBROSIS.   INGUINAL HERNIA REPAIR Left 05/04/2016   Procedure: HERNIA REPAIR INGUINAL ADULT;  Surgeon: Robert Bellow, MD;  Location: [facility] ORS;  Service: General;  Laterality: Left;   THYROIDECTOMY, PARTIAL  1956   TUBAL LIGATION       No current facility-administered medications for this encounter.  Current Outpatient Medications:    albuterol (VENTOLIN HFA) 108 (90 Base) MCG/ACT inhaler, Inhale 2 puffs into the lungs every 6 (six) hours as needed for wheezing or shortness of breath., Disp: 18 g, Rfl: 1   aspirin (ASPIRIN EC) 81 MG EC tablet, Take 81 mg by mouth daily. Swallow whole., Disp: , Rfl:    Cholecalciferol (VITAMIN D-3) 1000 UNITS CAPS,  Take 2,000 Units by mouth daily., Disp: , Rfl:    furosemide (LASIX) 40 MG tablet, Take 1/2 tablet-1 tablet as directed per weight gain., Disp: 30 tablet, Rfl: 3   isosorbide mononitrate (IMDUR) 30 MG 24 hr tablet, Take 1 tablet (30 mg total) by mouth daily., Disp: 30 tablet, Rfl: 9   metoprolol succinate (TOPROL-XL) 25 MG 24 hr tablet, Take 1 tablet (25 mg total) by mouth daily., Disp: 90 tablet, Rfl: 3   rosuvastatin (CRESTOR)  5 MG tablet, Take 1 tablet (5 mg total) by mouth daily., Disp: 90 tablet, Rfl: 3   sacubitril-valsartan (ENTRESTO) 97-103 MG, Take 1 tablet by mouth 2 (two) times daily., Disp: 60 tablet, Rfl: 11   spironolactone (ALDACTONE) 25 MG tablet, Take 1 tablet (25 mg total) by mouth daily., Disp: 90 tablet, Rfl: 3   acetaminophen (TYLENOL) 325 MG tablet, Take 650 mg by mouth every 6 (six) hours as needed., Disp: , Rfl:    senna-docusate (SENOKOT-S) 8.6-50 MG tablet, Take 2 tablets by mouth 2 (two) times daily as needed for mild constipation., Disp: 60 tablet, Rfl: 0  Allergies Patient has no known allergies.  Family History  Problem Relation Age of Onset   Stroke Mother    Colon cancer Father    Breast cancer Daughter 76   Breast cancer Cousin     Social History Social History   Tobacco Use   Smoking status: Never Smoker   Smokeless tobacco: Never Used  Scientific laboratory technician Use: Never used  Substance Use Topics   Alcohol use: No    Alcohol/week: 0.0 standard drinks   Drug use: No    Review of Systems Constitutional: No fever/chills Eyes: No visual changes. ENT: No sore throat. Cardiovascular: Denies chest pain. Respiratory: Denies shortness of breath. Gastrointestinal: No abdominal pain.   Genitourinary: Urinary frequency Skin: Negative for rash. Neurological: Positive headache.  Positive dizziness.  ____________________________________________   PHYSICAL EXAM:  VITAL SIGNS: ED Triage Vitals  Enc Vitals Group     BP 11/23/19 1211 (!) 179/89     Pulse Rate 11/23/19 1211 70     Resp 11/23/19 1211 14     Temp 11/23/19 1211 98.3 F (36.8 C)     Temp Source 11/23/19 1211 Oral     SpO2 11/23/19 1211 95 %     Weight 11/23/19 1209 130 lb (59 kg)     Height 11/23/19 1209 5\' 3"  (1.6 m)     Head Circumference --      Peak Flow --      Pain Score 11/23/19 1208 0     Pain Loc --      Pain Edu? --      Excl. in [place]? --     Constitutional: Alert and oriented.  Well appearing and in no acute distress. Eyes: Conjunctivae are normal. PERRL. EOMI. ENT      Head: Normocephalic and atraumatic.      Nose: No congestion/rhinnorhea.      Mouth/Throat: Mucous membranes are moist.Oropharynx non-erythematous. Neck: No stridor. Supple without meningismus.  Hematological/Lymphatic/Immunilogical: No cervical lymphadenopathy. Cardiovascular: Normal rate, regular rhythm. Grossly normal heart sounds.  Good peripheral circulation. Respiratory: Normal respiratory effort without tachypnea nor retractions. Breath sounds are clear and equal bilaterally. No wheezes, rales, rhonchi. Gastrointestinal: Soft and nontender. No distention. Normal Bowel sounds. No CVA tenderness. Musculoskeletal:  Nontender with normal range of motion in all extremities. No midline cervical, thoracic or lumbar tenderness to palpation. Bilateral pedal pulses equal and  easily palpated.      Right lower leg:  No tenderness or edema.      Left lower leg:  No tenderness or edema.  Neurologic:  Normal speech and language.  Speech normal.5/5 strength to the bilateral upper and lower extremities.  No ataxia noted.  Mild right facial droop, patient and son report this is chronic.  No paresthesias. Skin:  Skin is warm, dry and intact. No rash noted. Psychiatric: Mood and affect are normal. Speech and behavior are normal. Patient exhibits appropriate insight and judgment   ___________________________________________   LABS (all labs ordered are listed, but only abnormal results are displayed)  Labs Reviewed  GLUCOSE, CAPILLARY - Abnormal; Notable for the following components:      Result Value   Glucose-Capillary 100 (*)    All other components within normal limits   ____________________________________________  EKG  EKG Time: 1318 Normal sinus rhythm Left axis deviation Anterior infarct, age undetermined  Similar to previous EKG 06/2019  RADIOLOGY  No results  found. ____________________________________________   PROCEDURES Procedures     INITIAL IMPRESSION / ASSESSMENT AND PLAN / ED COURSE  Pertinent labs & imaging results that were available during my care of the patient were reviewed by me and considered in my medical decision making (see chart for details).  Patient presenting with son at bedside for atypical dizziness for her.  Patient does have mild right facial droop, but per patient this is chronic.  No accompanying paresthesias.  Dizziness is somewhat improved today and yesterday.  However patient with multiple comorbidities.  Discussed multiple differentials including CVA versus TIA, ACS and UTI.  Recommend further evaluation emergency room at this time.  Discussed EMS transfer, patient declined and states she will have her son take her and verbalized understanding of risk.  States he will go directly to [place] RN triage nurse called and given report.   ______________________________________   FINAL CLINICAL IMPRESSION(S) / ED DIAGNOSES  Final diagnoses:  Dizziness  Elevated blood pressure reading  Facial droop     ED Discharge Orders    None       Note: This dictation was prepared with Dragon dictation along with smaller phrase technology. Any transcriptional errors that result from this process are unintentional.         Marylene Land, NP 11/23/19 1343

## 2019-11-23 NOTE — ED Triage Notes (Signed)
Patient c/o dizziness that started Wed night.  Patient denies any pain.  Patient does reports runny nose.  Patient denies any other cold symptoms.  Patient denies fevers.

## 2019-11-23 NOTE — ED Notes (Signed)
Pt gave this RN verbal consent for DC.

## 2019-11-23 NOTE — ED Triage Notes (Signed)
Patient is being discharged from the Urgent Care and sent to the Emergency Department via private vehicle. Per Marylene Land, NP patient is in need of higher level of care due to needing to rule out stroke with CT. Patient is aware and verbalizes understanding of plan of care.  Vitals:   11/23/19 1211  BP: (!) 179/89  Pulse: 70  Resp: 14  Temp: 98.3 F (36.8 C)  SpO2: 95%

## 2019-11-23 NOTE — ED Triage Notes (Addendum)
Pt sent from  Urgent care in [place] to rule out stroke with CT.  Pt c/o dizziness since 10/27 and HA along with left leg weakness.  Denies Changes in vision. No facial droop noted by this RN.  Equal BI grip strength. Pt ambulatory. A/Ox4.   Denies Cp/SHOB at this time.  NAD noted.

## 2019-11-23 NOTE — Telephone Encounter (Signed)
Thank you.  Agree with evaluation

## 2019-11-26 ENCOUNTER — Ambulatory Visit (INDEPENDENT_AMBULATORY_CARE_PROVIDER_SITE_OTHER): Payer: Medicare HMO

## 2019-11-26 VITALS — Ht 63.0 in | Wt 130.0 lb

## 2019-11-26 DIAGNOSIS — Z0000 Encounter for general adult medical examination without abnormal findings: Secondary | ICD-10-CM

## 2019-11-26 NOTE — Patient Instructions (Addendum)
Kathy Dominguez , Thank you for taking time to come for your Medicare Wellness Visit. I appreciate your ongoing commitment to your health goals. Please review the following plan we discussed and let me know if I can assist you in the future.   These are the goals we discussed: Goals    . Follow up with Primary Care Provider     As needed       This is a list of the screening recommended for you and due dates:  Health Maintenance  Topic Date Due  . Mammogram  11/24/2019  . Flu Shot  12/14/2019*  . DEXA scan (bone density measurement)  11/25/2020*  . Tetanus Vaccine  06/12/2024*  . COVID-19 Vaccine  Completed  . Pneumonia vaccines  Completed  *Topic was postponed. The date shown is not the original due date.   Immunizations Immunization History  Administered Date(s) Administered  . Fluad Quad(high Dose 65+) 12/06/2018  . Influenza, High Dose Seasonal PF 12/04/2015, 11/23/2016, 11/21/2017  . Influenza,inj,Quad PF,6+ Mos 02/20/2015  . PFIZER SARS-COV-2 Vaccination 01/31/2019, 02/21/2019  . Pneumococcal Conjugate-13 12/19/2017  . Pneumococcal-Unspecified 01/26/2003  . Zoster Recombinat (Shingrix) 03/10/2018, 06/23/2018   Keep all routine maintenance appointments.   Next scheduled lab 12/12/19.  Follow up 12/14/19.  Advanced directives: not yet completed.  Conditions/risks identified: none new.  Follow up in one year for your annual wellness visit.   Preventive Care 30 Years and Older, Female Preventive care refers to lifestyle choices and visits with your health care provider that can promote health and wellness. What does preventive care include?  A yearly physical exam. This is also called an annual well check.  Dental exams once or twice a year.  Routine eye exams. Ask your health care provider how often you should have your eyes checked.  Personal lifestyle choices, including:  Daily care of your teeth and gums.  Regular physical activity.  Eating a healthy  diet.  Avoiding tobacco and drug use.  Limiting alcohol use.  Practicing safe sex.  Taking low-dose aspirin every day.  Taking vitamin and mineral supplements as recommended by your health care provider. What happens during an annual well check? The services and screenings done by your health care provider during your annual well check will depend on your age, overall health, lifestyle risk factors, and family history of disease. Counseling  Your health care provider may ask you questions about your:  Alcohol use.  Tobacco use.  Drug use.  Emotional well-being.  Home and relationship well-being.  Sexual activity.  Eating habits.  History of falls.  Memory and ability to understand (cognition).  Work and work Statistician.  Reproductive health. Screening  You may have the following tests or measurements:  Height, weight, and BMI.  Blood pressure.  Lipid and cholesterol levels. These may be checked every 5 years, or more frequently if you are over 20 years old.  Skin check.  Lung cancer screening. You may have this screening every year starting at age 79 if you have a 30-pack-year history of smoking and currently smoke or have quit within the past 15 years.  Fecal occult blood test (FOBT) of the stool. You may have this test every year starting at age 43.  Flexible sigmoidoscopy or colonoscopy. You may have a sigmoidoscopy every 5 years or a colonoscopy every 10 years starting at age 9.  Hepatitis C blood test.  Hepatitis B blood test.  Sexually transmitted disease (STD) testing.  Diabetes screening. This is done by  checking your blood sugar (glucose) after you have not eaten for a while (fasting). You may have this done every 1-3 years.  Bone density scan. This is done to screen for osteoporosis. You may have this done starting at age 33.  Mammogram. This may be done every 1-2 years. Talk to your health care provider about how often you should have  regular mammograms. Talk with your health care provider about your test results, treatment options, and if necessary, the need for more tests. Vaccines  Your health care provider may recommend certain vaccines, such as:  Influenza vaccine. This is recommended every year.  Tetanus, diphtheria, and acellular pertussis (Tdap, Td) vaccine. You may need a Td booster every 10 years.  Zoster vaccine. You may need this after age 49.  Pneumococcal 13-valent conjugate (PCV13) vaccine. One dose is recommended after age 69.  Pneumococcal polysaccharide (PPSV23) vaccine. One dose is recommended after age 72. Talk to your health care provider about which screenings and vaccines you need and how often you need them. This information is not intended to replace advice given to you by your health care provider. Make sure you discuss any questions you have with your health care provider. Document Released: 02/07/2015 Document Revised: 10/01/2015 Document Reviewed: 11/12/2014 Elsevier Interactive Patient Education  2017 [place] Prevention in the Home Falls can cause injuries. They can happen to people of all ages. There are many things you can do to make your home safe and to help prevent falls. What can I do on the outside of my home?  Regularly fix the edges of walkways and driveways and fix any cracks.  Remove anything that might make you trip as you walk through a door, such as a raised step or threshold.  Trim any bushes or trees on the path to your home.  Use bright outdoor lighting.  Clear any walking paths of anything that might make someone trip, such as rocks or tools.  Regularly check to see if handrails are loose or broken. Make sure that both sides of any steps have handrails.  Any raised decks and porches should have guardrails on the edges.  Have any leaves, snow, or ice cleared regularly.  Use sand or salt on walking paths during winter.  Clean up any spills in your  garage right away. This includes oil or grease spills. What can I do in the bathroom?  Use night lights.  Install grab bars by the toilet and in the tub and shower. Do not use towel bars as grab bars.  Use non-skid mats or decals in the tub or shower.  If you need to sit down in the shower, use a plastic, non-slip stool.  Keep the floor dry. Clean up any water that spills on the floor as soon as it happens.  Remove soap buildup in the tub or shower regularly.  Attach bath mats securely with double-sided non-slip rug tape.  Do not have throw rugs and other things on the floor that can make you trip. What can I do in the bedroom?  Use night lights.  Make sure that you have a light by your bed that is easy to reach.  Do not use any sheets or blankets that are too big for your bed. They should not hang down onto the floor.  Have a firm chair that has side arms. You can use this for support while you get dressed.  Do not have throw rugs and other things on the  floor that can make you trip. What can I do in the kitchen?  Clean up any spills right away.  Avoid walking on wet floors.  Keep items that you use a lot in easy-to-reach places.  If you need to reach something above you, use a strong step stool that has a grab bar.  Keep electrical cords out of the way.  Do not use floor polish or wax that makes floors slippery. If you must use wax, use non-skid floor wax.  Do not have throw rugs and other things on the floor that can make you trip. What can I do with my stairs?  Do not leave any items on the stairs.  Make sure that there are handrails on both sides of the stairs and use them. Fix handrails that are broken or loose. Make sure that handrails are as long as the stairways.  Check any carpeting to make sure that it is firmly attached to the stairs. Fix any carpet that is loose or worn.  Avoid having throw rugs at the top or bottom of the stairs. If you do have throw  rugs, attach them to the floor with carpet tape.  Make sure that you have a light switch at the top of the stairs and the bottom of the stairs. If you do not have them, ask someone to add them for you. What else can I do to help prevent falls?  Wear shoes that:  Do not have high heels.  Have rubber bottoms.  Are comfortable and fit you well.  Are closed at the toe. Do not wear sandals.  If you use a stepladder:  Make sure that it is fully opened. Do not climb a closed stepladder.  Make sure that both sides of the stepladder are locked into place.  Ask someone to hold it for you, if possible.  Clearly mark and make sure that you can see:  Any grab bars or handrails.  First and last steps.  Where the edge of each step is.  Use tools that help you move around (mobility aids) if they are needed. These include:  Canes.  Walkers.  Scooters.  Crutches.  Turn on the lights when you go into a dark area. Replace any light bulbs as soon as they burn out.  Set up your furniture so you have a clear path. Avoid moving your furniture around.  If any of your floors are uneven, fix them.  If there are any pets around you, be aware of where they are.  Review your medicines with your doctor. Some medicines can make you feel dizzy. This can increase your chance of falling. Ask your doctor what other things that you can do to help prevent falls. This information is not intended to replace advice given to you by your health care provider. Make sure you discuss any questions you have with your health care provider. Document Released: 11/07/2008 Document Revised: 06/19/2015 Document Reviewed: 02/15/2014 Elsevier Interactive Patient Education  2017 [employer].

## 2019-11-26 NOTE — Progress Notes (Addendum)
Subjective:   Kathy Dominguez is a 86 y.o. female who presents for Medicare Annual (Subsequent) preventive examination.  Review of Systems    No ROS.  Medicare Wellness Virtual Visit.   Cardiac Risk Factors include: advanced age (>13men, >79 women)     Objective:    Today's Vitals   11/26/19 0906  Weight: 130 lb (59 kg)  Height: 5\' 3"  (1.6 m)   Body mass index is 23.03 kg/m.  Advanced Directives 11/26/2019 11/23/2019 11/23/2019 06/28/2019 06/28/2019 11/23/2018 08/30/2017  Does Patient Have a Medical Advance Directive? No No No No No No No  Would patient like information on creating a medical advance directive? No - Patient declined - - No - Patient declined - No - Patient declined Yes (MAU/Ambulatory/Procedural Areas - Information given)    Current Medications (verified) Outpatient Encounter Medications as of 11/26/2019  Medication Sig   acetaminophen (TYLENOL) 325 MG tablet Take 650 mg by mouth every 6 (six) hours as needed.   albuterol (VENTOLIN HFA) 108 (90 Base) MCG/ACT inhaler Inhale 2 puffs into the lungs every 6 (six) hours as needed for wheezing or shortness of breath.   aspirin (ASPIRIN EC) 81 MG EC tablet Take 81 mg by mouth daily. Swallow whole.   Cholecalciferol (VITAMIN D-3) 1000 UNITS CAPS Take 2,000 Units by mouth daily.   furosemide (LASIX) 40 MG tablet Take 1/2 tablet-1 tablet as directed per weight gain.   isosorbide mononitrate (IMDUR) 30 MG 24 hr tablet Take 1 tablet (30 mg total) by mouth daily.   metoprolol succinate (TOPROL-XL) 25 MG 24 hr tablet Take 1 tablet (25 mg total) by mouth daily.   rosuvastatin (CRESTOR) 5 MG tablet Take 1 tablet (5 mg total) by mouth daily.   sacubitril-valsartan (ENTRESTO) 97-103 MG Take 1 tablet by mouth 2 (two) times daily.   senna-docusate (SENOKOT-S) 8.6-50 MG tablet Take 2 tablets by mouth 2 (two) times daily as needed for mild constipation.   spironolactone (ALDACTONE) 25 MG tablet Take 1 tablet (25 mg total) by mouth  daily.   No facility-administered encounter medications on file as of 11/26/2019.    Allergies (verified) Patient has no known allergies.   History: Past Medical History:  Diagnosis Date   Asthma    Chronic systolic CHF (congestive heart failure) ([place])    a. 01/2015 Echo: EF 25-30%, sev diff HK, mild to mod MR, mildly dil LA, nl RV fxn, PASP 61 mmHg.   Colon cancer ([place]) 2005   Hypercholesterolemia    Hypertension    Hypertensive heart disease with CHF (congestive heart failure) ([place])    NICM (nonischemic cardiomyopathy) ([place])    a. 01/2015 Echo: EF 25-30% sev diff HK;  b. 01/2015 Lexi MV: EF 19%, small defect of mild severity in apex - likely breast attenuation, no ischemia.   NSVT (nonsustained ventricular tachycardia) (HCC)    Osteoporosis    Past Surgical History:  Procedure Laterality Date   ABDOMINAL HYSTERECTOMY     BREAST BIOPSY Left 4/15`   BENIGN BREAST EPITHELIUM WITH NODULAR FIBROSIS.   INGUINAL HERNIA REPAIR Left 05/04/2016   Procedure: HERNIA REPAIR INGUINAL ADULT;  Surgeon: Robert Bellow, MD;  Location: [facility] ORS;  Service: General;  Laterality: Left;   THYROIDECTOMY, PARTIAL  1956   TUBAL LIGATION     Family History  Problem Relation Age of Onset   Stroke Mother    Colon cancer Father    Breast cancer Daughter 79   Breast cancer Cousin    Social  History   Socioeconomic History   Marital status: Widowed    Spouse name: Not on file   Number of children: Not on file   Years of education: Not on file   Highest education level: Not on file  Occupational History   Not on file  Tobacco Use   Smoking status: Never Smoker   Smokeless tobacco: Never Used  Vaping Use   Vaping Use: Never used  Substance and Sexual Activity   Alcohol use: No    Alcohol/week: 0.0 standard drinks   Drug use: No   Sexual activity: Not on file  Other Topics Concern   Not on file  Social History Narrative   Not on file   Social Determinants of Health   Financial Resource  Strain: Low Risk    Difficulty of Paying Living Expenses: Not hard at all  Food Insecurity: No Food Insecurity   Worried About Charity fundraiser in the Last Year: Never true   [place] in the Last Year: Never true  Transportation Needs: No Transportation Needs   Lack of Transportation (Medical): No   Lack of Transportation (Non-Medical): No  Physical Activity:    Days of Exercise per Week: Not on file   Minutes of Exercise per Session: Not on file  Stress: No Stress Concern Present   Feeling of Stress : Not at all  Social Connections: Unknown   Frequency of Communication with Friends and Family: More than three times a week   Frequency of Social Gatherings with Friends and Family: More than three times a week   Attends Religious Services: Not on file   Active Member of Clubs or Organizations: Not on file   Attends Archivist Meetings: Not on file   Marital Status: Widowed    Tobacco Counseling Counseling given: Not Answered   Clinical Intake:  Pre-visit preparation completed: Yes        Diabetes: No  How often do you need to have someone help you when you read instructions, pamphlets, or other written materials from your doctor or pharmacy?: 1 - Never  Interpreter Needed?: No      Activities of Daily Living In your present state of health, do you have any difficulty performing the following activities: 11/26/2019 06/28/2019  Hearing? N N  Vision? N N  Difficulty concentrating or making decisions? N N  Walking or climbing stairs? Y N  Comment Paces self. Chronic R knee pain. -  Dressing or bathing? N N  Doing errands, shopping? N N  Preparing Food and eating ? N -  Using the Toilet? N -  In the past six months, have you accidently leaked urine? N -  Do you have problems with loss of bowel control? N -  Managing your Medications? N -  Managing your Finances? N -  Housekeeping or managing your Housekeeping? N -  Some recent data might be hidden      Patient Care Team: Einar Pheasant, MD as PCP - General (Internal Medicine) Bary Castilla, Forest Gleason, MD (General Surgery) Einar Pheasant, MD (Internal Medicine) Alisa Graff, FNP as Nurse Practitioner (Family Medicine) Erby Pian, MD as Referring Physician (Pulmonary Disease) Minna Merritts, MD as Consulting Physician (Cardiology)  Indicate any recent Medical Services you may have received from other than [facility] providers in the past year (date may be approximate).     Assessment:   This is a routine wellness examination for Kathy Dominguez.  I connected with Belynda today  by telephone and verified that I am speaking with the correct person using two identifiers. Location patient: home Location provider: work Persons participating in the virtual visit: patient, Marine scientist.    I discussed the limitations, risks, security and privacy concerns of performing an evaluation and management service by telephone and the availability of in person appointments. The patient expressed understanding and verbally consented to this telephonic visit.    Interactive audio and video telecommunications were attempted between this provider and patient, however failed, due to patient having technical difficulties OR patient did not have access to video capability.  We continued and completed visit with audio only.  Some vital signs may be absent or patient reported.   Hearing/Vision screen  Hearing Screening   125Hz  250Hz  500Hz  1000Hz  2000Hz  3000Hz  4000Hz  6000Hz  8000Hz   Right ear:           Left ear:           Comments: Patient is able to hear conversational tones without difficulty. No issues reported.   Vision Screening Comments: Followed by [facility]  Wears corrective lenses Annual visits  Visual acuity not assessed per patient preference since they have regular follow up with the ophthalmologist.  Dietary issues and exercise activities discussed: Current Exercise Habits: Home exercise  routine, Intensity: Mild  Regular diet Good water intake  Goals      Follow up with Primary Care Provider     As needed       Depression Screen PHQ 2/9 Scores 11/26/2019 11/23/2018 08/30/2017 08/23/2017 05/17/2016 04/14/2016 02/16/2016  PHQ - 2 Score 0 0 0 0 0 0 0    Fall Risk Fall Risk  11/26/2019 11/23/2018 08/30/2017 08/23/2017 07/21/2017  Falls in the past year? 0 0 No No No  Number falls in past yr: 0 0 - - -  Follow up Falls evaluation completed - - - -   Handrails in use when climbing stairs? Yes Home free of loose throw rugs in walkways, pet beds, electrical cords, etc? Yes  Adequate lighting in your home to reduce risk of falls? Yes   ASSISTIVE DEVICES UTILIZED TO PREVENT FALLS: Life alert? No  Use of a cane, walker or w/c? No   TIMED UP AND GO: Was the test performed? No . Virtual visit.   Cognitive Function:  Patient is alert and oriented x3.  Denies difficulty focusing, making decisions, memory loss.  Enjoys sodoku and reading for brain health.    6CIT Screen 11/26/2019 11/23/2018 08/30/2017  What Year? 0 points 0 points 0 points  What month? 0 points 0 points 0 points  What time? - - 0 points  Count back from 20 0 points 0 points 0 points  Months in reverse 0 points 0 points 0 points  Repeat phrase - 4 points 0 points  Total Score - - 0   Immunizations Immunization History  Administered Date(s) Administered   Fluad Quad(high Dose 65+) 12/06/2018   Influenza, High Dose Seasonal PF 12/04/2015, 11/23/2016, 11/21/2017   Influenza,inj,Quad PF,6+ Mos 02/20/2015   PFIZER SARS-COV-2 Vaccination 01/31/2019, 02/21/2019   Pneumococcal Conjugate-13 12/19/2017   Pneumococcal-Unspecified 01/26/2003   Zoster Recombinat (Shingrix) 03/10/2018, 06/23/2018   Health Maintenance Health Maintenance  Topic Date Due   MAMMOGRAM  11/24/2019   INFLUENZA VACCINE  12/14/2019 (Originally 08/26/2019)   DEXA SCAN  11/25/2020 (Originally 03/12/1995)   TETANUS/TDAP  06/12/2024 (Originally  03/11/1949)   COVID-19 Vaccine  Completed   PNA vac Low Risk Adult  Completed   Mammogram-  scheduled 11/27/19.  Dexa Scan- declined per patient.   Lung Cancer Screening: (Low Dose CT Chest recommended if Age 16-80 years, 30 pack-year currently smoking OR have quit w/in 15years.) does not qualify.   Hepatitis C Screening: does not qualify.  Influenza vaccine- plans to receive   Vision Screening: Recommended annual ophthalmology exams for early detection of glaucoma and other disorders of the eye. Is the patient up to date with their annual eye exam?  Yes  Who is the provider or what is the name of the office in which the patient attends annual eye exams? Last visit by Dr. Graciella Belton. Plans to call [facility] to schedule next appointment.   Dental Screening: Recommended annual dental exams for proper oral hygiene. Dentures. Last visit 05/2019.   Community Resource Referral / Chronic Care Management: CRR required this visit?  No   CCM required this visit?  No      Plan:   Keep all routine maintenance appointments.   Next scheduled lab 12/12/19.  Follow up 12/14/19.  I have personally reviewed and noted the following in the patient's chart:   Medical and social history Use of alcohol, tobacco or illicit drugs  Current medications and supplements Functional ability and status Nutritional status Physical activity Advanced directives List of other physicians Hospitalizations, surgeries, and ER visits in previous 12 months Vitals Screenings to include cognitive, depression, and falls Referrals and appointments  In addition, I have reviewed and discussed with patient certain preventive protocols, quality metrics, and best practice recommendations. A written personalized care plan for preventive services as well as general preventive health recommendations were provided to patient via mail.     OBrien-Blaney, Jarrid Lienhard L, LPN   09/29/7094     I have reviewed the  above information and agree with above.   Deborra Medina, MD

## 2019-11-27 ENCOUNTER — Other Ambulatory Visit: Payer: Self-pay

## 2019-11-27 ENCOUNTER — Ambulatory Visit
Admission: RE | Admit: 2019-11-27 | Discharge: 2019-11-27 | Disposition: A | Discharge status: Home / Self Care | Payer: Medicare HMO | Source: Ambulatory Visit | Attending: Internal Medicine | Admitting: Internal Medicine

## 2019-11-27 DIAGNOSIS — Z1231 Encounter for screening mammogram for malignant neoplasm of breast: Secondary | ICD-10-CM | POA: Insufficient documentation

## 2019-12-12 ENCOUNTER — Other Ambulatory Visit: Payer: Self-pay

## 2019-12-12 ENCOUNTER — Other Ambulatory Visit (INDEPENDENT_AMBULATORY_CARE_PROVIDER_SITE_OTHER): Payer: Medicare HMO

## 2019-12-12 DIAGNOSIS — I10 Essential (primary) hypertension: Secondary | ICD-10-CM

## 2019-12-12 DIAGNOSIS — E7800 Pure hypercholesterolemia, unspecified: Secondary | ICD-10-CM | POA: Diagnosis not present

## 2019-12-12 DIAGNOSIS — N1831 Chronic kidney disease, stage 3a: Secondary | ICD-10-CM

## 2019-12-12 DIAGNOSIS — R739 Hyperglycemia, unspecified: Secondary | ICD-10-CM | POA: Diagnosis not present

## 2019-12-12 LAB — HEPATIC FUNCTION PANEL
ALT: 9 U/L (ref 0–35)
AST: 16 U/L (ref 0–37)
Albumin: 4.3 g/dL (ref 3.5–5.2)
Alkaline Phosphatase: 60 U/L (ref 39–117)
Bilirubin, Direct: 0.1 mg/dL (ref 0.0–0.3)
Total Bilirubin: 0.4 mg/dL (ref 0.2–1.2)
Total Protein: 7.1 g/dL (ref 6.0–8.3)

## 2019-12-12 LAB — BASIC METABOLIC PANEL
BUN: 23 mg/dL (ref 6–23)
CO2: 33 mEq/L — ABNORMAL HIGH (ref 19–32)
Calcium: 9.4 mg/dL (ref 8.4–10.5)
Chloride: 101 mEq/L (ref 96–112)
Creatinine, Ser: 1.28 mg/dL — ABNORMAL HIGH (ref 0.40–1.20)
GFR: 37.08 mL/min — ABNORMAL LOW (ref >60.00)
Glucose, Bld: 99 mg/dL (ref 70–99)
Potassium: 5 mEq/L (ref 3.5–5.1)
Sodium: 140 mEq/L (ref 135–145)

## 2019-12-12 LAB — LIPID PANEL
Cholesterol: 156 mg/dL (ref 0–200)
HDL: 80.2 mg/dL (ref >39.00)
LDL Cholesterol: 65 mg/dL (ref 0–99)
NonHDL: 75.9
Total CHOL/HDL Ratio: 2
Triglycerides: 54 mg/dL (ref 0.0–149.0)
VLDL: 10.8 mg/dL (ref 0.0–40.0)

## 2019-12-12 LAB — TSH: TSH: 0.98 u[IU]/mL (ref 0.35–4.50)

## 2019-12-12 LAB — HEMOGLOBIN A1C: Hgb A1c MFr Bld: 6.2 % (ref 4.6–6.5)

## 2019-12-14 ENCOUNTER — Other Ambulatory Visit: Payer: Self-pay

## 2019-12-14 ENCOUNTER — Ambulatory Visit: Payer: Medicare HMO | Admitting: Internal Medicine

## 2019-12-14 VITALS — BP 128/68 | HR 78 | Temp 97.7°F | Resp 16 | Ht 63.0 in | Wt 132.8 lb

## 2019-12-14 DIAGNOSIS — Z85038 Personal history of other malignant neoplasm of large intestine: Secondary | ICD-10-CM

## 2019-12-14 DIAGNOSIS — E7800 Pure hypercholesterolemia, unspecified: Secondary | ICD-10-CM

## 2019-12-14 DIAGNOSIS — R739 Hyperglycemia, unspecified: Secondary | ICD-10-CM

## 2019-12-14 DIAGNOSIS — I4729 Other ventricular tachycardia: Secondary | ICD-10-CM

## 2019-12-14 DIAGNOSIS — I472 Ventricular tachycardia: Secondary | ICD-10-CM

## 2019-12-14 DIAGNOSIS — I110 Hypertensive heart disease with heart failure: Secondary | ICD-10-CM

## 2019-12-14 DIAGNOSIS — R945 Abnormal results of liver function studies: Secondary | ICD-10-CM

## 2019-12-14 DIAGNOSIS — R7989 Other specified abnormal findings of blood chemistry: Secondary | ICD-10-CM

## 2019-12-14 DIAGNOSIS — I5022 Chronic systolic (congestive) heart failure: Secondary | ICD-10-CM

## 2019-12-14 DIAGNOSIS — I10 Essential (primary) hypertension: Secondary | ICD-10-CM

## 2019-12-14 DIAGNOSIS — J4520 Mild intermittent asthma, uncomplicated: Secondary | ICD-10-CM | POA: Diagnosis not present

## 2019-12-14 DIAGNOSIS — N1832 Chronic kidney disease, stage 3b: Secondary | ICD-10-CM

## 2019-12-14 NOTE — Progress Notes (Signed)
[name], female   DOB: 30-Apr-1928, 86 y.o.   MRN: [id number]   Subjective:    [name], female    DOB: 1928/09/08, 86 y.o.   MRN: [id number]  HPI This visit occurred during the SARS-CoV-2 public health emergency.  Safety protocols were in place, including screening questions prior to the visit, additional usage of staff PPE, and extensive cleaning of exam room while observing appropriate contact time as indicated for disinfecting solutions.  Patient here for a scheduled follow up. Was seen in ER 11/23/19 for dizziness and elevated blood pressure.  CT head - no acute abnormality.  CT chest - no PE.  MRI - no acute intracranial process.  Remote right cerebellar insult with mild cerebral atrophy.  Advanced chronic microvascular ischemic changes.  Remote right frontal and right cerebellar microhemorrhages.  She was discharged.  Has had no recurring symptoms.  Overall feels things are stable.  No chest pain.  Breathing overall stable.  Some knee pain prn.  Takes tylenol.  Helps. Doing PT exercises.  Desires no further intervention.  Sometimes experiences some low back fatigue if stands for a long period of time.  No radicular symptoms.  Taking lasix qod.  On entresto.  Feels cardiac symptoms stable.  No increased cough or congestion.    Past Medical History:  Diagnosis Date  . Asthma   . Chronic systolic CHF (congestive heart failure) ([place])    a. 01/2015 Echo: EF 25-30%, sev diff HK, mild to mod MR, mildly dil LA, nl RV fxn, PASP 61 mmHg.  . Colon cancer ([place]) 2005  . Hypercholesterolemia   . Hypertension   . Hypertensive heart disease with CHF (congestive heart failure) ([place])   . NICM (nonischemic cardiomyopathy) ([place])    a. 01/2015 Echo: EF 25-30% sev diff HK;  b. 01/2015 Lexi MV: EF 19%, small defect of mild severity in apex - likely breast attenuation, no ischemia.  Marland Kitchen NSVT (nonsustained ventricular tachycardia) ([place])   . Osteoporosis    Past Surgical  History:  Procedure Laterality Date  . ABDOMINAL HYSTERECTOMY    . BREAST BIOPSY Left 4/15`   BENIGN BREAST EPITHELIUM WITH NODULAR FIBROSIS.  Marland Kitchen INGUINAL HERNIA REPAIR Left 05/04/2016   Procedure: HERNIA REPAIR INGUINAL ADULT;  Surgeon: Robert Bellow, MD;  Location: [facility] ORS;  Service: General;  Laterality: Left;  . THYROIDECTOMY, PARTIAL  1956  . TUBAL LIGATION     Family History  Problem Relation Age of Onset  . Stroke Mother   . Colon cancer Father   . Breast cancer Daughter 32  . Breast cancer Cousin    Social History   Socioeconomic History  . Marital status: Widowed    Spouse name: Not on file  . Number of children: Not on file  . Years of education: Not on file  . Highest education level: Not on file  Occupational History  . Not on file  Tobacco Use  . Smoking status: Never Smoker  . Smokeless tobacco: Never Used  Vaping Use  . Vaping Use: Never used  Substance and Sexual Activity  . Alcohol use: No    Alcohol/week: 0.0 standard drinks  . Drug use: No  . Sexual activity: Not on file  Other Topics Concern  . Not on file  Social History Narrative  . Not on file   Social Determinants of Health   Financial Resource Strain: Low Risk   . Difficulty of Paying Living Expenses: Not hard at all  Food Insecurity: No Food Insecurity  . Worried About Charity fundraiser in the Last Year: Never true  . Ran Out of Food in the Last Year: Never true  Transportation Needs: No Transportation Needs  . Lack of Transportation (Medical): No  . Lack of Transportation (Non-Medical): No  Physical Activity:   . Days of Exercise per Week: Not on file  . Minutes of Exercise per Session: Not on file  Stress: No Stress Concern Present  . Feeling of Stress : Not at all  Social Connections: Unknown  . Frequency of Communication with Friends and Family: More than three times a week  . Frequency of Social Gatherings with Friends and Family: More than three times a week  . Attends  Religious Services: Not on file  . Active Member of Clubs or Organizations: Not on file  . Attends Archivist Meetings: Not on file  . Marital Status: Widowed    Outpatient Encounter Medications as of 12/14/2019  Medication Sig  . acetaminophen (TYLENOL) 325 MG tablet Take 650 mg by mouth every 6 (six) hours as needed.  Marland Kitchen albuterol (VENTOLIN HFA) 108 (90 Base) MCG/ACT inhaler Inhale 2 puffs into the lungs every 6 (six) hours as needed for wheezing or shortness of breath.  Marland Kitchen aspirin (ASPIRIN EC) 81 MG EC tablet Take 81 mg by mouth daily. Swallow whole.  . Cholecalciferol (VITAMIN D-3) 1000 UNITS CAPS Take 2,000 Units by mouth daily.  . furosemide (LASIX) 40 MG tablet Take 1/2 tablet-1 tablet as directed per weight gain.  . isosorbide mononitrate (IMDUR) 30 MG 24 hr tablet Take 1 tablet (30 mg total) by mouth daily.  . metoprolol succinate (TOPROL-XL) 25 MG 24 hr tablet Take 1 tablet (25 mg total) by mouth daily.  . rosuvastatin (CRESTOR) 5 MG tablet Take 1 tablet (5 mg total) by mouth daily.  . sacubitril-valsartan (ENTRESTO) 97-103 MG Take 1 tablet by mouth 2 (two) times daily.  Marland Kitchen senna-docusate (SENOKOT-S) 8.6-50 MG tablet Take 2 tablets by mouth 2 (two) times daily as needed for mild constipation.  Marland Kitchen spironolactone (ALDACTONE) 25 MG tablet Take 1 tablet (25 mg total) by mouth daily.   No facility-administered encounter medications on file as of 12/14/2019.    Review of Systems  Constitutional: Negative for appetite change and unexpected weight change.  HENT: Negative for congestion and sinus pressure.   Respiratory: Negative for cough and chest tightness.        Breathing stable.   Cardiovascular: Negative for chest pain, palpitations and leg swelling.  Gastrointestinal: Negative for abdominal pain, diarrhea, nausea and vomiting.  Genitourinary: Negative for difficulty urinating and dysuria.  Musculoskeletal: Negative for joint swelling and myalgias.       Intermittent  knee pain and back discomfort as outlined.    Skin: Negative for color change and rash.  Neurological: Negative for dizziness, light-headedness and headaches.  Psychiatric/Behavioral: Negative for agitation and dysphoric mood.       Objective:    Physical Exam Vitals reviewed.  Constitutional:      General: She is not in acute distress.    Appearance: Normal appearance.  HENT:     Head: Normocephalic and atraumatic.     Right Ear: External ear normal.     Left Ear: External ear normal.  Eyes:     General: No scleral icterus.       Right eye: No discharge.        Left eye: No discharge.     Conjunctiva/sclera:  Conjunctivae normal.  Neck:     Thyroid: No thyromegaly.  Cardiovascular:     Rate and Rhythm: Normal rate and regular rhythm.  Pulmonary:     Effort: No respiratory distress.     Breath sounds: Normal breath sounds. No wheezing.  Abdominal:     General: Bowel sounds are normal.     Palpations: Abdomen is soft.     Tenderness: There is no abdominal tenderness.  Musculoskeletal:        General: No swelling or tenderness.     Cervical back: Neck supple. No tenderness.  Lymphadenopathy:     Cervical: No cervical adenopathy.  Skin:    Findings: No erythema or rash.  Neurological:     Mental Status: She is alert.  Psychiatric:        Mood and Affect: Mood normal.        Behavior: Behavior normal.     BP 128/68   Pulse 78   Temp 97.7 F (36.5 C) (Oral)   Resp 16   Ht 5' 3"  (1.6 m)   Wt 132 lb 12.8 oz (60.2 kg)   LMP 02/21/1966   SpO2 97%   BMI 23.52 kg/m  Wt Readings from Last 3 Encounters:  12/14/19 132 lb 12.8 oz (60.2 kg)  11/26/19 130 lb (59 kg)  11/23/19 130 lb (59 kg)     Lab Results  Component Value Date   WBC 3.9 (L) 11/23/2019   HGB 11.4 (L) 11/23/2019   HCT 34.7 (L) 11/23/2019   PLT 180 11/23/2019   GLUCOSE 99 12/12/2019   CHOL 156 12/12/2019   TRIG 54.0 12/12/2019   HDL 80.20 12/12/2019   LDLDIRECT 128.3 02/08/2013   LDLCALC 65  12/12/2019   ALT 9 12/12/2019   AST 16 12/12/2019   NA 140 12/12/2019   K 5.0 12/12/2019   CL 101 12/12/2019   CREATININE 1.28 (H) 12/12/2019   BUN 23 12/12/2019   CO2 33 (H) 12/12/2019   TSH 0.98 12/12/2019   INR 0.9 06/28/2019   HGBA1C 6.2 12/12/2019    MM 3D SCREEN BREAST BILATERAL  Result Date: 11/28/2019 CLINICAL DATA:  Screening. EXAM: DIGITAL SCREENING BILATERAL MAMMOGRAM WITH TOMO AND CAD COMPARISON:  Previous exam(s). ACR Breast Density Category c: The breast tissue is heterogeneously dense, which may obscure small masses. FINDINGS: There are no findings suspicious for malignancy. Images were processed with CAD. IMPRESSION: No mammographic evidence of malignancy. A result letter of this screening mammogram will be mailed directly to the patient. RECOMMENDATION: Screening mammogram in one year. (Code:SM-B-01Y) BI-RADS CATEGORY  1: Negative. Electronically Signed   By: Kristopher Oppenheim M.D.   On: 11/28/2019 09:35       Assessment & Plan:   Problem List Items Addressed This Visit    NSVT (nonsustained ventricular tachycardia) ([place])    Documented per cardiology.  Stable.  On metoprolol.        Hypertensive heart disease with CHF (congestive heart failure) (HCC)    No evidence of volume overload.  Continue entresto, aldactone, lasix and metoprolol.        Hyperglycemia    Low carb diet and exercise.  Follow met b and a1c.       Hypercholesterolemia    On crestor.  Low cholesterol diet and exercise.  Tolerating qod crestor.  Follow lipid panel and liver function tests.        History of colon cancer    Have been following [facility].  Recently evaluated by oncology for  increase.  PET ok.  Follow.       Essential hypertension    Continue aldactone, entresto, metoprlol and lasix.  Follow pressures.  Blood pressure as outlined.  Follow metabolic panel.       CKD (chronic kidney disease), stage III (HCC) - Primary    GFR decreased - recent check.  Avoid antiinflammatories.   Continue current medication regimen.  Follow metabolic panel.       Relevant Orders   Basic metabolic panel   Chronic systolic heart failure (HCC) (Chronic)    Taking lasix, metoprolol, spironolactone and entresto and imdur.  No evidence of volume overload.  GFR decreased.  Continue current medication regimen.  Follow.       Asthma    Followed by pulmonary.  Breathing stable.       Abnormal liver function tests    Follow liver function tests.  Liver panel 12/12/19 - wnl.           Einar Pheasant, MD

## 2019-12-15 ENCOUNTER — Encounter: Payer: Self-pay | Admitting: Internal Medicine

## 2019-12-15 NOTE — Assessment & Plan Note (Signed)
Documented per cardiology.  Stable.  On metoprolol.

## 2019-12-15 NOTE — Assessment & Plan Note (Signed)
Taking lasix, metoprolol, spironolactone and entresto and imdur.  No evidence of volume overload.  GFR decreased.  Continue current medication regimen.  Follow.

## 2019-12-15 NOTE — Assessment & Plan Note (Signed)
Continue aldactone, entresto, metoprlol and lasix.  Follow pressures.  Blood pressure as outlined.  Follow metabolic panel.

## 2019-12-15 NOTE — Assessment & Plan Note (Signed)
Low carb diet and exercise.  Follow met b and a1c.  

## 2019-12-15 NOTE — Assessment & Plan Note (Signed)
GFR decreased - recent check.  Avoid antiinflammatories.  Continue current medication regimen.  Follow metabolic panel.

## 2019-12-15 NOTE — Assessment & Plan Note (Signed)
Have been following CEA.  Recently evaluated by oncology for increase.  PET ok.  Follow.

## 2019-12-15 NOTE — Assessment & Plan Note (Signed)
No evidence of volume overload.  Continue entresto, aldactone, lasix and metoprolol.

## 2019-12-15 NOTE — Assessment & Plan Note (Signed)
On crestor.  Low cholesterol diet and exercise.  Tolerating qod crestor.  Follow lipid panel and liver function tests.

## 2019-12-15 NOTE — Assessment & Plan Note (Signed)
Follow liver function tests.  Liver panel 12/12/19 - wnl.

## 2019-12-15 NOTE — Assessment & Plan Note (Signed)
Followed by pulmonary.  Breathing stable.   

## 2019-12-18 ENCOUNTER — Other Ambulatory Visit: Payer: Self-pay

## 2019-12-18 ENCOUNTER — Other Ambulatory Visit (INDEPENDENT_AMBULATORY_CARE_PROVIDER_SITE_OTHER): Payer: Medicare HMO

## 2019-12-18 DIAGNOSIS — N1832 Chronic kidney disease, stage 3b: Secondary | ICD-10-CM

## 2019-12-18 LAB — BASIC METABOLIC PANEL
BUN: 26 mg/dL — ABNORMAL HIGH (ref 6–23)
CO2: 33 mEq/L — ABNORMAL HIGH (ref 19–32)
Calcium: 9.4 mg/dL (ref 8.4–10.5)
Chloride: 98 mEq/L (ref 96–112)
Creatinine, Ser: 1.24 mg/dL — ABNORMAL HIGH (ref 0.40–1.20)
GFR: 38.51 mL/min — ABNORMAL LOW (ref >60.00)
Glucose, Bld: 130 mg/dL — ABNORMAL HIGH (ref 70–99)
Potassium: 4.9 mEq/L (ref 3.5–5.1)
Sodium: 136 mEq/L (ref 135–145)

## 2019-12-19 ENCOUNTER — Telehealth: Payer: Self-pay | Admitting: *Deleted

## 2019-12-19 ENCOUNTER — Other Ambulatory Visit: Payer: Self-pay | Admitting: Internal Medicine

## 2019-12-19 DIAGNOSIS — R944 Abnormal results of kidney function studies: Secondary | ICD-10-CM

## 2019-12-19 NOTE — Progress Notes (Signed)
Order placed for f/u met b 

## 2019-12-19 NOTE — Telephone Encounter (Signed)
Left message to return call to office.

## 2019-12-19 NOTE — Telephone Encounter (Signed)
-----  Message from Einar Pheasant, MD sent at 12/19/2019  5:45 AM EST ----- Notify pt that her kidney function is relatively stable.  We will need to continue to monitor given her current medication regimen.  Schedule for f/u met b in 4 weeks.

## 2020-01-08 ENCOUNTER — Telehealth: Payer: Self-pay | Admitting: Cardiovascular Disease

## 2020-01-08 NOTE — Telephone Encounter (Signed)
Patient dropped off [employer] assistance forms for Entresto to be completed  Placed in Advertising copywriter

## 2020-01-09 ENCOUNTER — Telehealth: Payer: Self-pay

## 2020-01-09 NOTE — Telephone Encounter (Signed)
Pt mailed in her [facility] assistance application, Dr. Rockey Situ has signed and this RN has filled-in the providers and prescription part of application. This RN has faxed completed forms to [employer]

## 2020-01-16 ENCOUNTER — Other Ambulatory Visit: Payer: Self-pay

## 2020-01-16 ENCOUNTER — Other Ambulatory Visit (INDEPENDENT_AMBULATORY_CARE_PROVIDER_SITE_OTHER): Payer: Medicare HMO

## 2020-01-16 DIAGNOSIS — R944 Abnormal results of kidney function studies: Secondary | ICD-10-CM

## 2020-01-16 LAB — BASIC METABOLIC PANEL
BUN: 33 mg/dL — ABNORMAL HIGH (ref 6–23)
CO2: 34 mEq/L — ABNORMAL HIGH (ref 19–32)
Calcium: 9.4 mg/dL (ref 8.4–10.5)
Chloride: 102 mEq/L (ref 96–112)
Creatinine, Ser: 1.54 mg/dL — ABNORMAL HIGH (ref 0.40–1.20)
GFR: 29.68 mL/min — ABNORMAL LOW (ref >60.00)
Glucose, Bld: 110 mg/dL — ABNORMAL HIGH (ref 70–99)
Potassium: 4.7 mEq/L (ref 3.5–5.1)
Sodium: 141 mEq/L (ref 135–145)

## 2020-01-17 ENCOUNTER — Telehealth: Payer: Self-pay | Admitting: Cardiovascular Disease

## 2020-01-17 NOTE — Telephone Encounter (Signed)
Per Jinny Blossom rn scheduled 2 week fu with labs   Spoke with patient and scheduled Kathy Dominguez 230 pm Jan 6

## 2020-01-17 NOTE — Telephone Encounter (Signed)
Dr. Nicki Reaper pcp calling to discuss medication and recent labs

## 2020-01-17 NOTE — Telephone Encounter (Signed)
Spoke with Dr. Nicki Reaper via phone and secure chat regarding pt's decline in kidney function. Recent GFR now 29. Hx of CHF, HTN, non-ischemic COM. Pt currently taking Entresto 97-103mg  and Spironolactone 25mg  daily and originally pt reported she was not taking Lasix. Pt later notified PCP that she has been taking Lasix every other day. Kathy Montana, NP in office and reviewed pt's chart, with new information that pt reported, agrees that pt may cut back the Lasix to taking only PRN for weight gain of 3lbs overnight and/or 5lbs in one week. And also recommends we schedule pt to be seen in our office in 2 weeks for follow up and to repeat Bmet. Dr. Nicki Reaper made aware of recc and pt has been scheduled for follow up 01/31/20 with Kathy Faith, PA and we will check Bmet at visit.

## 2020-01-23 ENCOUNTER — Telehealth: Payer: Self-pay | Admitting: Internal Medicine

## 2020-01-23 NOTE — Telephone Encounter (Signed)
Spoken to patient, she stated she now has a fever over 100, headache, coughing, fatigue, nasal congestion, and NO SOB. Due to sx and nio avaible appointments, I instructed patient to be evaluated at [facility]. She stated she would comply with directions.

## 2020-01-23 NOTE — Telephone Encounter (Signed)
Patient has the following symptoms; headache, coughing, no fever, fatigue, nose congestion.No available appointments at the time of phone call.

## 2020-01-28 ENCOUNTER — Telehealth: Payer: Self-pay | Admitting: Cardiovascular Disease

## 2020-01-28 NOTE — Progress Notes (Deleted)
Cardiology Office Note    Date:  01/28/2020   ID:  Kathy Dominguez, DOB Nov 07, 1928, MRN [id number]  PCP:  Einar Pheasant, MD  Cardiologist:  Ida Rogue, MD  Electrophysiologist:  None   Chief Complaint: Follow up  History of Present Illness:   Kathy Dominguez is a 87 y.o. female with history of ***  ***   Labs independently reviewed: 12/2019 - potassium 4.7, BUN 33, serum creatinine 1.54 11/2019 - BUN 26, serum creatinine 1.24, potassium 4.9, TSH normal, TC 136, TG 54, HDL 80, LDL 65, albumin 4.3, AST/ALT normal, A1c 6.2 10/2019 - Hgb 11.4, PLT 180  Past Medical History:  Diagnosis Date  . Asthma   . Chronic systolic CHF (congestive heart failure) ([place])    a. 01/2015 Echo: EF 25-30%, sev diff HK, mild to mod MR, mildly dil LA, nl RV fxn, PASP 61 mmHg.  . Colon cancer ([place]) 2005  . Hypercholesterolemia   . Hypertension   . Hypertensive heart disease with CHF (congestive heart failure) ([place])   . NICM (nonischemic cardiomyopathy) ([place])    a. 01/2015 Echo: EF 25-30% sev diff HK;  b. 01/2015 Lexi MV: EF 19%, small defect of mild severity in apex - likely breast attenuation, no ischemia.  Marland Kitchen NSVT (nonsustained ventricular tachycardia) ([place])   . Osteoporosis     Past Surgical History:  Procedure Laterality Date  . ABDOMINAL HYSTERECTOMY    . BREAST BIOPSY Left 4/15`   BENIGN BREAST EPITHELIUM WITH NODULAR FIBROSIS.  Marland Kitchen INGUINAL HERNIA REPAIR Left 05/04/2016   Procedure: HERNIA REPAIR INGUINAL ADULT;  Surgeon: Robert Bellow, MD;  Location: [facility] ORS;  Service: General;  Laterality: Left;  . THYROIDECTOMY, PARTIAL  1956  . TUBAL LIGATION      Current Medications: No outpatient medications have been marked as taking for the 01/31/20 encounter (Appointment) with Rise Mu, PA-C.    Allergies:   Patient has no known allergies.   Social History   Socioeconomic History  . Marital status: Widowed    Spouse name: Not on file  . Number of children: Not on file   . Years of education: Not on file  . Highest education level: Not on file  Occupational History  . Not on file  Tobacco Use  . Smoking status: Never Smoker  . Smokeless tobacco: Never Used  Vaping Use  . Vaping Use: Never used  Substance and Sexual Activity  . Alcohol use: No    Alcohol/week: 0.0 standard drinks  . Drug use: No  . Sexual activity: Not on file  Other Topics Concern  . Not on file  Social History Narrative  . Not on file   Social Determinants of Health   Financial Resource Strain: Low Risk   . Difficulty of Paying Living Expenses: Not hard at all  Food Insecurity: No Food Insecurity  . Worried About Charity fundraiser in the Last Year: Never true  . Ran Out of Food in the Last Year: Never true  Transportation Needs: No Transportation Needs  . Lack of Transportation (Medical): No  . Lack of Transportation (Non-Medical): No  Physical Activity: Not on file  Stress: No Stress Concern Present  . Feeling of Stress : Not at all  Social Connections: Unknown  . Frequency of Communication with Friends and Family: More than three times a week  . Frequency of Social Gatherings with Friends and Family: More than three times a week  . Attends Religious Services: Not on file  .  Active Member of Clubs or Organizations: Not on file  . Attends Banker Meetings: Not on file  . Marital Status: Widowed     Family History:  The patient's family history includes Breast cancer in her cousin; Breast cancer (age of onset: 97) in her daughter; Colon cancer in her father; Stroke in her mother.  ROS:   ROS   EKGs/Labs/Other Studies Reviewed:    Studies reviewed were summarized above. The additional studies were reviewed today:  2D echo 09/28/2016: - Left ventricle: The cavity size was normal. There was mild  concentric hypertrophy. Systolic function was normal. The  estimated ejection fraction was in the range of 50% to 55%. Wall  motion was normal; there  were no regional wall motion  abnormalities. Doppler parameters are consistent with abnormal  left ventricular relaxation (grade 1 diastolic dysfunction).  - Pulmonary arteries: Systolic pressure was within the normal  range.   Impressions:   - EF improved to low normal and pulmonary pressure normalized. __________  [name] MPI 02/02/2016:  There was no ST segment deviation noted during stress.  Defect 1: There is a small defect of mild severity present in the apex location. This is likely due to breast attenuation.  This is a high risk study due to low EF.  Nuclear stress EF: 19%.  No evidence of ischemia on this study. __________  2D echo 01/29/2016: - Left ventricle: The cavity size was mildly dilated. Systolic  function was severely reduced. The estimated ejection fraction  was in the range of 25% to 30%. Severe diffuse hypokinesis.  Regional wall motion abnormalities cannot be excluded. Left  ventricular diastolic function parameters were normal.  - Mitral valve: There was mild to moderate regurgitation.  - Left atrium: The atrium was mildly dilated.  - Right ventricle: Systolic function was normal.  - Pulmonary arteries: Systolic pressure was moderate to severely  elevated. PA peak pressure: 61 mm Hg (S).   EKG:  EKG is ordered today.  The EKG ordered today demonstrates ***  Recent Labs: 11/23/2019: B Natriuretic Peptide 98.9; Hemoglobin 11.4; Platelets 180 12/12/2019: ALT 9; TSH 0.98 01/16/2020: BUN 33; Creatinine, Ser 1.54; Potassium 4.7; Sodium 141  Recent Lipid Panel    Component Value Date/Time   CHOL 156 12/12/2019 0807   TRIG 54.0 12/12/2019 0807   HDL 80.20 12/12/2019 0807   CHOLHDL 2 12/12/2019 0807   VLDL 10.8 12/12/2019 0807   LDLCALC 65 12/12/2019 0807   LDLDIRECT 128.3 02/08/2013 0944    PHYSICAL EXAM:    VS:  LMP 02/21/1966   BMI: There is no height or weight on file to calculate BMI.  Physical Exam  Wt Readings from Last 3  Encounters:  12/14/19 132 lb 12.8 oz (60.2 kg)  11/26/19 130 lb (59 kg)  11/23/19 130 lb (59 kg)     ASSESSMENT & PLAN:   1. ***  Disposition: F/u with Dr. Mariah Milling or an APP in ***.   Medication Adjustments/Labs and Tests Ordered: Current medicines are reviewed at length with the patient today.  Concerns regarding medicines are outlined above. Medication changes, Labs and Tests ordered today are summarized above and listed in the Patient Instructions accessible in Encounters.   Signed, Eula Listen, PA-C 01/28/2020 1:34 PM     [facility] [place] [address] [place], [place] [address] [phone]

## 2020-01-28 NOTE — Telephone Encounter (Signed)
Patient 2 week fu appt rescheduled due to covid in household and positive home test

## 2020-01-31 ENCOUNTER — Ambulatory Visit: Payer: Medicare HMO | Admitting: Physician Assistant

## 2020-02-04 NOTE — Progress Notes (Deleted)
Cardiology Office Note    Date:  02/04/2020   ID:  Kathy Dominguez, DOB 07/28/1928, MRN [id number]  PCP:  Einar Pheasant, MD  Cardiologist:  Ida Rogue, MD  Electrophysiologist:  None   Chief Complaint: Follow up  History of Present Illness:   Kathy Dominguez is a 87 y.o. female with history of ***  ***   Labs independently reviewed: 12/2019 - potassium 4.7, BUN 33, serum creatinine 1.54 11/2019 - BUN 26, serum creatinine 1.24, potassium 4.9, TSH normal, TC 136, TG 54, HDL 80, LDL 65, albumin 4.3, AST/ALT normal, A1c 6.2 10/2019 - Hgb 11.4, PLT 180  Past Medical History:  Diagnosis Date  . Asthma   . Chronic systolic CHF (congestive heart failure) ([place])    a. 01/2015 Echo: EF 25-30%, sev diff HK, mild to mod MR, mildly dil LA, nl RV fxn, PASP 61 mmHg.  . Colon cancer ([place]) 2005  . Hypercholesterolemia   . Hypertension   . Hypertensive heart disease with CHF (congestive heart failure) ([place])   . NICM (nonischemic cardiomyopathy) ([place])    a. 01/2015 Echo: EF 25-30% sev diff HK;  b. 01/2015 Lexi MV: EF 19%, small defect of mild severity in apex - likely breast attenuation, no ischemia.  Marland Kitchen NSVT (nonsustained ventricular tachycardia) ([place])   . Osteoporosis     Past Surgical History:  Procedure Laterality Date  . ABDOMINAL HYSTERECTOMY    . BREAST BIOPSY Left 4/15`   BENIGN BREAST EPITHELIUM WITH NODULAR FIBROSIS.  Marland Kitchen INGUINAL HERNIA REPAIR Left 05/04/2016   Procedure: HERNIA REPAIR INGUINAL ADULT;  Surgeon: Robert Bellow, MD;  Location: [facility] ORS;  Service: General;  Laterality: Left;  . THYROIDECTOMY, PARTIAL  1956  . TUBAL LIGATION      Current Medications: No outpatient medications have been marked as taking for the 02/11/20 encounter (Appointment) with Rise Mu, PA-C.    Allergies:   Patient has no known allergies.   Social History   Socioeconomic History  . Marital status: Widowed    Spouse name: Not on file  . Number of children: Not on file   . Years of education: Not on file  . Highest education level: Not on file  Occupational History  . Not on file  Tobacco Use  . Smoking status: Never Smoker  . Smokeless tobacco: Never Used  Vaping Use  . Vaping Use: Never used  Substance and Sexual Activity  . Alcohol use: No    Alcohol/week: 0.0 standard drinks  . Drug use: No  . Sexual activity: Not on file  Other Topics Concern  . Not on file  Social History Narrative  . Not on file   Social Determinants of Health   Financial Resource Strain: Low Risk   . Difficulty of Paying Living Expenses: Not hard at all  Food Insecurity: No Food Insecurity  . Worried About Charity fundraiser in the Last Year: Never true  . Ran Out of Food in the Last Year: Never true  Transportation Needs: No Transportation Needs  . Lack of Transportation (Medical): No  . Lack of Transportation (Non-Medical): No  Physical Activity: Not on file  Stress: No Stress Concern Present  . Feeling of Stress : Not at all  Social Connections: Unknown  . Frequency of Communication with Friends and Family: More than three times a week  . Frequency of Social Gatherings with Friends and Family: More than three times a week  . Attends Religious Services: Not on file  .  Active Member of Clubs or Organizations: Not on file  . Attends Archivist Meetings: Not on file  . Marital Status: Widowed     Family History:  The patient's family history includes Breast cancer in her cousin; Breast cancer (age of onset: 50) in her daughter; Colon cancer in her father; Stroke in her mother.  ROS:   ROS   EKGs/Labs/Other Studies Reviewed:    Studies reviewed were summarized above. The additional studies were reviewed today:  2D echo 09/28/2016: - Left ventricle: The cavity size was normal. There was mild  concentric hypertrophy. Systolic function was normal. The  estimated ejection fraction was in the range of 50% to 55%. Wall  motion was normal; there  were no regional wall motion  abnormalities. Doppler parameters are consistent with abnormal  left ventricular relaxation (grade 1 diastolic dysfunction).  - Pulmonary arteries: Systolic pressure was within the normal  range.   Impressions:   - EF improved to low normal and pulmonary pressure normalized. __________  Carlton Adam MPI 02/02/2016:  There was no ST segment deviation noted during stress.  Defect 1: There is a small defect of mild severity present in the apex location. This is likely due to breast attenuation.  This is a high risk study due to low EF.  Nuclear stress EF: 19%.  No evidence of ischemia on this study. __________  2D echo 01/29/2016: - Left ventricle: The cavity size was mildly dilated. Systolic  function was severely reduced. The estimated ejection fraction  was in the range of 25% to 30%. Severe diffuse hypokinesis.  Regional wall motion abnormalities cannot be excluded. Left  ventricular diastolic function parameters were normal.  - Mitral valve: There was mild to moderate regurgitation.  - Left atrium: The atrium was mildly dilated.  - Right ventricle: Systolic function was normal.  - Pulmonary arteries: Systolic pressure was moderate to severely  elevated. PA peak pressure: 61 mm Hg (S).   EKG:  EKG is ordered today.  The EKG ordered today demonstrates ***  Recent Labs: 11/23/2019: B Natriuretic Peptide 98.9; Hemoglobin 11.4; Platelets 180 12/12/2019: ALT 9; TSH 0.98 01/16/2020: BUN 33; Creatinine, Ser 1.54; Potassium 4.7; Sodium 141  Recent Lipid Panel    Component Value Date/Time   CHOL 156 12/12/2019 0807   TRIG 54.0 12/12/2019 0807   HDL 80.20 12/12/2019 0807   CHOLHDL 2 12/12/2019 0807   VLDL 10.8 12/12/2019 0807   LDLCALC 65 12/12/2019 0807   LDLDIRECT 128.3 02/08/2013 0944    PHYSICAL EXAM:    VS:  LMP 02/21/1966   BMI: There is no height or weight on file to calculate BMI.  Physical Exam  Wt Readings from Last 3  Encounters:  12/14/19 132 lb 12.8 oz (60.2 kg)  11/26/19 130 lb (59 kg)  11/23/19 130 lb (59 kg)     ASSESSMENT & PLAN:   1. ***  Disposition: F/u with Dr. Rockey Situ or an APP in ***.   Medication Adjustments/Labs and Tests Ordered: Current medicines are reviewed at length with the patient today.  Concerns regarding medicines are outlined above. Medication changes, Labs and Tests ordered today are summarized above and listed in the Patient Instructions accessible in Encounters.   Signed, Christell Faith, PA-C 02/04/2020 10:40 AM     [facility] [place], [place] [address] [phone]

## 2020-02-07 ENCOUNTER — Telehealth: Payer: Self-pay

## 2020-02-07 NOTE — Telephone Encounter (Signed)
Pt scheduled with Dr Scott to discuss. 

## 2020-02-07 NOTE — Telephone Encounter (Signed)
Pt tested positive on 02/01/20. She got results yesterday. She wants to know when she should take another covid test? She said she has a dr appt that is coming up and she need to know when it will be ok for her to go to dr.

## 2020-02-08 ENCOUNTER — Telehealth (INDEPENDENT_AMBULATORY_CARE_PROVIDER_SITE_OTHER): Payer: Medicare HMO | Admitting: Internal Medicine

## 2020-02-08 ENCOUNTER — Other Ambulatory Visit: Payer: Self-pay

## 2020-02-08 DIAGNOSIS — U071 COVID-19: Secondary | ICD-10-CM | POA: Diagnosis not present

## 2020-02-08 DIAGNOSIS — I5022 Chronic systolic (congestive) heart failure: Secondary | ICD-10-CM

## 2020-02-08 NOTE — Progress Notes (Signed)
[name], female   DOB: [birth year], 87 y.o.   MRN: [id number]   Virtual Visit via telephone Note  This visit type was conducted due to national recommendations for restrictions regarding the COVID-19 pandemic (e.g. social distancing).  This format is felt to be most appropriate for this patient at this time.  All issues noted in this document were discussed and addressed.  No physical exam was performed (except for noted visual exam findings with Video Visits).   I connected with [name] today by telephone and verified that I am speaking with the correct person using two identifiers. Location patient: home Location provider: work  Persons participating in the telephone visit: patient, provider  The limitations, risks, security and privacy concerns of performing an evaluation and management service by telephone and the availability of in person appointments have been discussed.  It has also been discussed with the patient that there may be a patient responsible charge related to this service. The patient expressed understanding and agreed to proceed.   Reason for visit: work in appt  HPI: Work in - covid positive.  Son - exposure to person with covid.  He later tested positive.  She had runny nose.  12/27-12/28 - felt bad.  Subjective fever - first day.  No headache.  No sinus pressure.  Runny nose - continues.  No sore throat.  Some cough.  Started taking delsym.  Delsym helping.  No sob, chest pain or chest tightness.  No nausea, vomiting or diarrhea.  covid postive 01/27/20 and PCR 02/01/20. Weight stable.  Breathing overall stable.  States she feels back to her baseline.     ROS: See pertinent positives and negatives per HPI.  Past Medical History:  Diagnosis Date  . Asthma   . Chronic systolic CHF (congestive heart failure) ([place])    a. 01/2015 Echo: EF 25-30%, sev diff HK, mild to mod MR, mildly dil LA, nl RV fxn, PASP 61 mmHg.  . Colon cancer ([place]) 2005  .  Hypercholesterolemia   . Hypertension   . Hypertensive heart disease with CHF (congestive heart failure) ([place])   . NICM (nonischemic cardiomyopathy) ([place])    a. 01/2015 Echo: EF 25-30% sev diff HK;  b. 01/2015 Lexi MV: EF 19%, small defect of mild severity in apex - likely breast attenuation, no ischemia.  Marland Kitchen NSVT (nonsustained ventricular tachycardia) ([place])   . Osteoporosis     Past Surgical History:  Procedure Laterality Date  . ABDOMINAL HYSTERECTOMY    . BREAST BIOPSY Left 4/15`   BENIGN BREAST EPITHELIUM WITH NODULAR FIBROSIS.  Marland Kitchen INGUINAL HERNIA REPAIR Left 05/04/2016   Procedure: HERNIA REPAIR INGUINAL ADULT;  Surgeon: Robert Bellow, MD;  Location: [facility] ORS;  Service: General;  Laterality: Left;  . THYROIDECTOMY, PARTIAL  1956  . TUBAL LIGATION      Family History  Problem Relation Age of Onset  . Stroke Mother   . Colon cancer Father   . Breast cancer Daughter 20  . Breast cancer Cousin     SOCIAL HX: reviewed.    Current Outpatient Medications:  .  acetaminophen (TYLENOL) 325 MG tablet, Take 650 mg by mouth every 6 (six) hours as needed., Disp: , Rfl:  .  albuterol (VENTOLIN HFA) 108 (90 Base) MCG/ACT inhaler, Inhale 2 puffs into the lungs every 6 (six) hours as needed for wheezing or shortness of breath., Disp: 18 g, Rfl: 1 .  aspirin (ASPIRIN EC) 81 MG EC tablet, Take 81 mg  by mouth daily. Swallow whole., Disp: , Rfl:  .  Cholecalciferol (VITAMIN D-3) 1000 UNITS CAPS, Take 2,000 Units by mouth daily., Disp: , Rfl:  .  furosemide (LASIX) 40 MG tablet, Take 1/2 tablet-1 tablet as directed per weight gain., Disp: 30 tablet, Rfl: 3 .  isosorbide mononitrate (IMDUR) 30 MG 24 hr tablet, Take 1 tablet (30 mg total) by mouth daily., Disp: 30 tablet, Rfl: 9 .  metoprolol succinate (TOPROL-XL) 25 MG 24 hr tablet, Take 1 tablet (25 mg total) by mouth daily., Disp: 90 tablet, Rfl: 3 .  rosuvastatin (CRESTOR) 5 MG tablet, Take 1 tablet (5 mg total) by mouth daily., Disp: 90  tablet, Rfl: 3 .  sacubitril-valsartan (ENTRESTO) 97-103 MG, Take 1 tablet by mouth 2 (two) times daily., Disp: 60 tablet, Rfl: 11 .  senna-docusate (SENOKOT-S) 8.6-50 MG tablet, Take 2 tablets by mouth 2 (two) times daily as needed for mild constipation., Disp: 60 tablet, Rfl: 0 .  spironolactone (ALDACTONE) 25 MG tablet, Take 1 tablet (25 mg total) by mouth daily., Disp: 90 tablet, Rfl: 3  EXAM:  GENERAL: alert. Sounds to be in no acute distress.  Answering questions appropriately.   PSYCH/NEURO: pleasant and cooperative, no obvious depression or anxiety, speech and thought processing grossly intact  ASSESSMENT AND PLAN:  Discussed the following assessment and plan:  Problem List Items Addressed This Visit    Chronic systolic heart failure (HCC) (Chronic)    Taking metoprolol, spironolactone, entrestro and imdur.  Has lasix to take if needed.  Has not required.  Weight stable.  Follow.       COVID-19 virus infection    covid infection.  Feeling better.  Feels back to her baseline.  Breathing stable.  Continue current medication regimen as outlined.  Delsym helped - no significant cough now.  Follow.            I discussed the assessment and treatment plan with the patient. The patient was provided an opportunity to ask questions and all were answered. The patient agreed with the plan and demonstrated an understanding of the instructions.   The patient was advised to call back or seek an in-person evaluation if the symptoms worsen or if the condition fails to improve as anticipated.  I provided 23 minutes of non-face-to-face time during this encounter.   Einar Pheasant, MD

## 2020-02-09 ENCOUNTER — Encounter: Payer: Self-pay | Admitting: Internal Medicine

## 2020-02-09 DIAGNOSIS — U071 COVID-19: Secondary | ICD-10-CM | POA: Insufficient documentation

## 2020-02-09 NOTE — Assessment & Plan Note (Signed)
Taking metoprolol, spironolactone, entrestro and imdur.  Has lasix to take if needed.  Has not required.  Weight stable.  Follow.  ?

## 2020-02-09 NOTE — Assessment & Plan Note (Signed)
covid infection.  Feeling better.  Feels back to her baseline.  Breathing stable.  Continue current medication regimen as outlined.  Delsym helped - no significant cough now.  Follow.

## 2020-02-11 ENCOUNTER — Ambulatory Visit: Payer: Medicare HMO | Admitting: Physician Assistant

## 2020-02-13 ENCOUNTER — Telehealth: Payer: Self-pay

## 2020-02-13 NOTE — Telephone Encounter (Signed)
[employer] faxed form stating pt is approved for her assistance of Entresto for the upcoming 2022 year

## 2020-02-19 NOTE — Progress Notes (Signed)
Cardiology Office Note    Date:  02/21/2020   ID:  Kathy Dominguez, DOB March 17, 1928, MRN [id number]  PCP:  Einar Pheasant, MD  Cardiologist:  Ida Rogue, MD  Electrophysiologist:  None   Chief Complaint: Follow up  History of Present Illness:   Kathy Dominguez is a 87 y.o. female with history of HFrEF secondary to NICM, hypertensive heart disease, Covid illness diagnosed in 01/2020, PAD, aortic atherosclerosis, HLD, and osteoporosis who presents for follow-up of her cardiomyopathy.  She was admitted to the hospital in 01/2016 with progressive dyspnea and found to have CHF with new LV dysfunction.  Echo showed an EF of 25 to 30%, diffuse hypokinesis, normal LV diastolic function parameters, mild to moderate mitral regurgitation, mildly dilated left atrium, normal RV systolic function, and PASP 61 mmHg.  Cardiac enzymes were negative.  She was diuresed and underwent Lexiscan MPI which showed no significant ischemia.  She was placed on GDMT.  Follow-up echo in 09/2016 showed near normalization of LV systolic function with an EF of 50 to 55%, no regional wall motion abnormalities, mild concentric LVH, grade 1 diastolic dysfunction, and normal PASP.  CT images from 2018 showed PAD most notably involving the iliac vessels along with aortic atherosclerosis.  She was last seen in the office in 03/2019 and was doing well from a cardiac perspective.  She was taking Lasix every other day due to urinary frequency.  In late 12/2019, it was noted recent BMP obtained at outside office showed a reduction in her GFR.  Given this, it was recommended she take Lasix as needed rather than every other day an appointment be scheduled.  Initially, her follow-up appointment with our office was delayed given a Covid positive status in her son.  She subsequently tested positive on 02/01/2020 with PCR, though did not require hospitalization.  She comes in doing well from a cardiac perspective.  She denies any chest  pain, dyspnea, palpitations, dizziness, presyncope, syncope, falls, hematochezia, or melena.  Her weight remains stable at home.  Since her Lasix was transitioned from every other day dosing to as needed dosing she has not needed any.  She is tolerating Crestor 5 mg at every other day dosing.  She watches her salt and p.o. fluid intake.  No orthopnea, lower extremity swelling, abdominal distention, PND, or early satiety.  No lingering aftereffects from her Covid illness.  She does not have any issues or concerns at this time.   Labs independently reviewed: 12/2019 - potassium 4.7, BUN 33, SCr 1.54 11/2019 - TSH normal, TC 156, TG 54, HDL 80, LDL 65, albumin 4.3, AST/ALT normal, A1c 6.2  Past Medical History:  Diagnosis Date  . Asthma   . Chronic systolic CHF (congestive heart failure) ([place])    a. 01/2015 Echo: EF 25-30%, sev diff HK, mild to mod MR, mildly dil LA, nl RV fxn, PASP 61 mmHg.  . Colon cancer ([place]) 2005  . Hypercholesterolemia   . Hypertension   . Hypertensive heart disease with CHF (congestive heart failure) ([place])   . NICM (nonischemic cardiomyopathy) ([place])    a. 01/2015 Echo: EF 25-30% sev diff HK;  b. 01/2015 Lexi MV: EF 19%, small defect of mild severity in apex - likely breast attenuation, no ischemia.  Marland Kitchen NSVT (nonsustained ventricular tachycardia) ([place])   . Osteoporosis     Past Surgical History:  Procedure Laterality Date  . ABDOMINAL HYSTERECTOMY    . BREAST BIOPSY Left 4/15`   BENIGN BREAST EPITHELIUM WITH  NODULAR FIBROSIS.  Marland Kitchen INGUINAL HERNIA REPAIR Left 05/04/2016   Procedure: HERNIA REPAIR INGUINAL ADULT;  Surgeon: Robert Bellow, MD;  Location: [facility] ORS;  Service: General;  Laterality: Left;  . THYROIDECTOMY, PARTIAL  1956  . TUBAL LIGATION      Current Medications: Current Meds  Medication Sig  . acetaminophen (TYLENOL) 325 MG tablet Take 650 mg by mouth every 6 (six) hours as needed.  Marland Kitchen albuterol (VENTOLIN HFA) 108 (90 Base) MCG/ACT inhaler Inhale 2 puffs  into the lungs every 6 (six) hours as needed for wheezing or shortness of breath.  Marland Kitchen aspirin 81 MG EC tablet Take 81 mg by mouth daily. Swallow whole.  . Cholecalciferol (VITAMIN D-3) 1000 UNITS CAPS Take 2,000 Units by mouth daily.  . furosemide (LASIX) 40 MG tablet Take one tablet  (40MG ) if weight is greater than 130.  . isosorbide mononitrate (IMDUR) 30 MG 24 hr tablet Take 1 tablet (30 mg total) by mouth daily.  . metoprolol succinate (TOPROL-XL) 25 MG 24 hr tablet Take 1 tablet (25 mg total) by mouth daily.  . rosuvastatin (CRESTOR) 5 MG tablet Take 5 mg by mouth every other day.  . sacubitril-valsartan (ENTRESTO) 97-103 MG Take 1 tablet by mouth 2 (two) times daily.  Marland Kitchen senna-docusate (SENOKOT-S) 8.6-50 MG tablet Take 2 tablets by mouth 2 (two) times daily as needed for mild constipation.  Marland Kitchen spironolactone (ALDACTONE) 25 MG tablet Take 1 tablet (25 mg total) by mouth daily.    Allergies:   Patient has no known allergies.   Social History   Socioeconomic History  . Marital status: Widowed    Spouse name: Not on file  . Number of children: Not on file  . Years of education: Not on file  . Highest education level: Not on file  Occupational History  . Not on file  Tobacco Use  . Smoking status: Never Smoker  . Smokeless tobacco: Never Used  Vaping Use  . Vaping Use: Never used  Substance and Sexual Activity  . Alcohol use: No    Alcohol/week: 0.0 standard drinks  . Drug use: No  . Sexual activity: Not on file  Other Topics Concern  . Not on file  Social History Narrative  . Not on file   Social Determinants of Health   Financial Resource Strain: Low Risk   . Difficulty of Paying Living Expenses: Not hard at all  Food Insecurity: No Food Insecurity  . Worried About Charity fundraiser in the Last Year: Never true  . Ran Out of Food in the Last Year: Never true  Transportation Needs: No Transportation Needs  . Lack of Transportation (Medical): No  . Lack of  Transportation (Non-Medical): No  Physical Activity: Not on file  Stress: No Stress Concern Present  . Feeling of Stress : Not at all  Social Connections: Unknown  . Frequency of Communication with Friends and Family: More than three times a week  . Frequency of Social Gatherings with Friends and Family: More than three times a week  . Attends Religious Services: Not on file  . Active Member of Clubs or Organizations: Not on file  . Attends Archivist Meetings: Not on file  . Marital Status: Widowed     Family History:  The patient's family history includes Breast cancer in her cousin; Breast cancer (age of onset: 42) in her daughter; Colon cancer in her father; Stroke in her mother.  ROS:   Review of Systems  Constitutional:  Negative for chills, diaphoresis, fever, malaise/fatigue and weight loss.  HENT: Negative for congestion.   Eyes: Negative for discharge and redness.  Respiratory: Negative for cough, sputum production, shortness of breath and wheezing.   Cardiovascular: Negative for chest pain, palpitations, orthopnea, claudication, leg swelling and PND.  Gastrointestinal: Negative for abdominal pain, blood in stool, heartburn, melena, nausea and vomiting.  Musculoskeletal: Negative for falls and myalgias.  Skin: Negative for rash.  Neurological: Negative for dizziness, tingling, tremors, sensory change, speech change, focal weakness, loss of consciousness and weakness.  Endo/Heme/Allergies: Does not bruise/bleed easily.  Psychiatric/Behavioral: Negative for substance abuse. The patient is not nervous/anxious.   All other systems reviewed and are negative.    EKGs/Labs/Other Studies Reviewed:    Studies reviewed were summarized above. The additional studies were reviewed today:  2D echo 09/2016: - Left ventricle: The cavity size was normal. There was mild  concentric hypertrophy. Systolic function was normal. The  estimated ejection fraction was in the range  of 50% to 55%. Wall  motion was normal; there were no regional wall motion  abnormalities. Doppler parameters are consistent with abnormal  left ventricular relaxation (grade 1 diastolic dysfunction).  - Pulmonary arteries: Systolic pressure was within the normal  range.   Impressions:   - EF improved to low normal and pulmonary pressure normalized. __________  2D echo 01/2016: - Left ventricle: The cavity size was mildly dilated. Systolic  function was severely reduced. The estimated ejection fraction  was in the range of 25% to 30%. Severe diffuse hypokinesis.  Regional wall motion abnormalities cannot be excluded. Left  ventricular diastolic function parameters were normal.  - Mitral valve: There was mild to moderate regurgitation.  - Left atrium: The atrium was mildly dilated.  - Right ventricle: Systolic function was normal.  - Pulmonary arteries: Systolic pressure was moderate to severely  elevated. PA peak pressure: 61 mm Hg (S). __________  Carlton Adam MPI 01/2016:  There was no ST segment deviation noted during stress.  Defect 1: There is a small defect of mild severity present in the apex location. This is likely due to breast attenuation.  This is a high risk study due to low EF.  Nuclear stress EF: 19%.  No evidence of ischemia on this study.   EKG:  EKG is ordered today.  The EKG ordered today demonstrates NSR, 77 bpm, left axis deviation, no acute ST-T changes  Recent Labs: 11/23/2019: B Natriuretic Peptide 98.9; Hemoglobin 11.4; Platelets 180 12/12/2019: ALT 9; TSH 0.98 01/16/2020: BUN 33; Creatinine, Ser 1.54; Potassium 4.7; Sodium 141  Recent Lipid Panel    Component Value Date/Time   CHOL 156 12/12/2019 0807   TRIG 54.0 12/12/2019 0807   HDL 80.20 12/12/2019 0807   CHOLHDL 2 12/12/2019 0807   VLDL 10.8 12/12/2019 0807   LDLCALC 65 12/12/2019 0807   LDLDIRECT 128.3 02/08/2013 0944    PHYSICAL EXAM:    VS:  BP 122/80 (BP Location:  Left Arm, Patient Position: Sitting, Cuff Size: Normal)   Pulse 77   Ht 5\' 2"  (1.575 m)   Wt 130 lb (59 kg)   LMP 02/21/1966   SpO2 93%   BMI 23.78 kg/m   BMI: Body mass index is 23.78 kg/m.  Physical Exam Vitals reviewed.  Constitutional:      Appearance: She is well-developed and well-nourished.  HENT:     Head: Normocephalic and atraumatic.  Eyes:     General:        Right eye: No discharge.  Left eye: No discharge.  Neck:     Vascular: No JVD.  Cardiovascular:     Rate and Rhythm: Normal rate and regular rhythm.     Pulses: No midsystolic click and no opening snap.          Posterior tibial pulses are 2+ on the right side and 2+ on the left side.     Heart sounds: Normal heart sounds, S1 normal and S2 normal. Heart sounds not distant. No murmur heard. No friction rub.  Pulmonary:     Effort: Pulmonary effort is normal. No respiratory distress.     Breath sounds: Normal breath sounds. No decreased breath sounds, wheezing or rales.  Chest:     Chest wall: No tenderness.  Abdominal:     General: There is no distension.     Palpations: Abdomen is soft.     Tenderness: There is no abdominal tenderness.  Musculoskeletal:        General: No edema.     Cervical back: Normal range of motion.  Skin:    General: Skin is warm and dry.     Nails: There is no clubbing or cyanosis.  Neurological:     Mental Status: She is alert and oriented to person, place, and time.  Psychiatric:        Mood and Affect: Mood and affect normal.        Speech: Speech normal.        Behavior: Behavior normal.        Thought Content: Thought content normal.        Judgment: Judgment normal.     Wt Readings from Last 3 Encounters:  02/21/20 130 lb (59 kg)  12/14/19 132 lb 12.8 oz (60.2 kg)  11/26/19 130 lb (59 kg)     ASSESSMENT & PLAN:   1. HFrEF secondary to NICM: She is doing well, euvolemic, and well compensated.  She has NYHA class I-II symptoms with the majority of her  limitations likely being in the setting of deconditioning noted with aging process.  Continue current medical therapy including Toprol-XL, Entresto, and spironolactone.  CHF education.  2. HTN: Blood pressure is well controlled in the office today.  Continue current medications as outlined above.  3. HLD: LDL 65 from 11/2019 with normal LFT at that time.  Tolerating Crestor 5 mg every other day dosing.  4. PAD/aortic atherosclerosis: Noted on CT imaging in 2018.  No symptoms of lifestyle limiting claudication.  Crestor as outlined above.  5. AKI: Check BMP.    Disposition: F/u with Dr. Rockey Situ or an APP in 6 months.   Medication Adjustments/Labs and Tests Ordered: Current medicines are reviewed at length with the patient today.  Concerns regarding medicines are outlined above. Medication changes, Labs and Tests ordered today are summarized above and listed in the Patient Instructions accessible in Encounters.   Signed, Christell Faith, PA-C 02/21/2020 4:09 PM     [facility] [address] [place] [address] [place], [place] [address] [phone]

## 2020-02-21 ENCOUNTER — Ambulatory Visit: Payer: Medicare HMO | Admitting: Physician Assistant

## 2020-02-21 ENCOUNTER — Other Ambulatory Visit: Payer: Self-pay

## 2020-02-21 ENCOUNTER — Encounter: Payer: Self-pay | Admitting: Physician Assistant

## 2020-02-21 VITALS — BP 122/80 | HR 77 | Ht 62.0 in | Wt 130.0 lb

## 2020-02-21 DIAGNOSIS — N179 Acute kidney failure, unspecified: Secondary | ICD-10-CM

## 2020-02-21 DIAGNOSIS — I428 Other cardiomyopathies: Secondary | ICD-10-CM | POA: Diagnosis not present

## 2020-02-21 DIAGNOSIS — E785 Hyperlipidemia, unspecified: Secondary | ICD-10-CM | POA: Diagnosis not present

## 2020-02-21 DIAGNOSIS — I10 Essential (primary) hypertension: Secondary | ICD-10-CM | POA: Diagnosis not present

## 2020-02-21 DIAGNOSIS — I5022 Chronic systolic (congestive) heart failure: Secondary | ICD-10-CM | POA: Diagnosis not present

## 2020-02-21 NOTE — Patient Instructions (Signed)
Medication Instructions:  No changes  *If you need a refill on your cardiac medications before your next appointment, please call your pharmacy*   Lab Work: BMET  If you have labs (blood work) drawn today and your tests are completely normal, you will receive your results only by: Marland Kitchen MyChart Message (if you have MyChart) OR . A paper copy in the mail If you have any lab test that is abnormal or we need to change your treatment, we will call you to review the results.   Testing/Procedures: None   Follow-Up: At [facility], you and your health needs are our priority.  As part of our continuing mission to provide you with exceptional heart care, we have created designated Provider Care Teams.  These Care Teams include your primary Cardiologist (physician) and Advanced Practice Providers (APPs -  Physician Assistants and Nurse Practitioners) who all work together to provide you with the care you need, when you need it.  We recommend signing up for the patient portal called "MyChart".  Sign up information is provided on this After Visit Summary.  MyChart is used to connect with patients for Virtual Visits (Telemedicine).  Patients are able to view lab/test results, encounter notes, upcoming appointments, etc.  Non-urgent messages can be sent to your provider as well.   To learn more about what you can do with MyChart, go to [email or web address].    Your next appointment:   6 month(s)  The format for your next appointment:   In Person  Provider:   You may see Ida Rogue, MD or one of the following Advanced Practice Providers on your designated Care Team:    Murray Hodgkins, NP  Christell Faith, PA-C  Marrianne Mood, PA-C  Cadence Hamilton, [place]  Laurann Montana, NP

## 2020-02-22 LAB — BASIC METABOLIC PANEL
BUN/Creatinine Ratio: 13 (ref 12–28)
BUN: 16 mg/dL (ref 8–27)
CO2: 25 mmol/L (ref 20–29)
Calcium: 9.4 mg/dL (ref 8.7–10.3)
Chloride: 100 mmol/L (ref 96–106)
Creatinine, Ser: 1.22 mg/dL — ABNORMAL HIGH (ref 0.57–1.00)
GFR calc Af Amer: 45 mL/min/{1.73_m2} — ABNORMAL LOW (ref >59)
GFR calc non Af Amer: 39 mL/min/{1.73_m2} — ABNORMAL LOW (ref >59)
Glucose: 104 mg/dL — ABNORMAL HIGH (ref 65–99)
Potassium: 4.4 mmol/L (ref 3.5–5.2)
Sodium: 139 mmol/L (ref 134–144)

## 2020-03-24 ENCOUNTER — Other Ambulatory Visit: Payer: Self-pay | Admitting: Cardiovascular Disease

## 2020-04-15 ENCOUNTER — Other Ambulatory Visit: Payer: Self-pay | Admitting: Cardiovascular Disease

## 2020-04-16 ENCOUNTER — Ambulatory Visit (INDEPENDENT_AMBULATORY_CARE_PROVIDER_SITE_OTHER): Payer: Medicare HMO | Admitting: Internal Medicine

## 2020-04-16 ENCOUNTER — Encounter: Payer: Self-pay | Admitting: Internal Medicine

## 2020-04-16 ENCOUNTER — Other Ambulatory Visit: Payer: Self-pay

## 2020-04-16 VITALS — BP 134/78 | HR 88 | Temp 98.1°F | Ht 62.0 in | Wt 129.8 lb

## 2020-04-16 DIAGNOSIS — E7800 Pure hypercholesterolemia, unspecified: Secondary | ICD-10-CM | POA: Diagnosis not present

## 2020-04-16 DIAGNOSIS — I10 Essential (primary) hypertension: Secondary | ICD-10-CM

## 2020-04-16 DIAGNOSIS — I472 Ventricular tachycardia: Secondary | ICD-10-CM

## 2020-04-16 DIAGNOSIS — I110 Hypertensive heart disease with heart failure: Secondary | ICD-10-CM | POA: Diagnosis not present

## 2020-04-16 DIAGNOSIS — R739 Hyperglycemia, unspecified: Secondary | ICD-10-CM

## 2020-04-16 DIAGNOSIS — D649 Anemia, unspecified: Secondary | ICD-10-CM | POA: Diagnosis not present

## 2020-04-16 DIAGNOSIS — I4729 Other ventricular tachycardia: Secondary | ICD-10-CM

## 2020-04-16 DIAGNOSIS — N1832 Chronic kidney disease, stage 3b: Secondary | ICD-10-CM

## 2020-04-16 DIAGNOSIS — Z85038 Personal history of other malignant neoplasm of large intestine: Secondary | ICD-10-CM

## 2020-04-16 DIAGNOSIS — D72819 Decreased white blood cell count, unspecified: Secondary | ICD-10-CM

## 2020-04-16 DIAGNOSIS — Z0000 Encounter for general adult medical examination without abnormal findings: Secondary | ICD-10-CM | POA: Diagnosis not present

## 2020-04-16 DIAGNOSIS — I428 Other cardiomyopathies: Secondary | ICD-10-CM

## 2020-04-16 LAB — BASIC METABOLIC PANEL
BUN: 26 mg/dL — ABNORMAL HIGH (ref 6–23)
CO2: 31 mEq/L (ref 19–32)
Calcium: 9.6 mg/dL (ref 8.4–10.5)
Chloride: 101 mEq/L (ref 96–112)
Creatinine, Ser: 1.2 mg/dL (ref 0.40–1.20)
GFR: 39.97 mL/min — ABNORMAL LOW (ref >60.00)
Glucose, Bld: 125 mg/dL — ABNORMAL HIGH (ref 70–99)
Potassium: 4.6 mEq/L (ref 3.5–5.1)
Sodium: 140 mEq/L (ref 135–145)

## 2020-04-16 LAB — LIPID PANEL
Cholesterol: 223 mg/dL — ABNORMAL HIGH (ref 0–200)
HDL: 75.8 mg/dL (ref >39.00)
LDL Cholesterol: 134 mg/dL — ABNORMAL HIGH (ref 0–99)
NonHDL: 147.27
Total CHOL/HDL Ratio: 3
Triglycerides: 64 mg/dL (ref 0.0–149.0)
VLDL: 12.8 mg/dL (ref 0.0–40.0)

## 2020-04-16 LAB — VITAMIN B12: Vitamin B-12: 499 pg/mL (ref 211–911)

## 2020-04-16 LAB — CBC WITH DIFFERENTIAL/PLATELET
Basophils Absolute: 0 10*3/uL (ref 0.0–0.1)
Basophils Relative: 0.4 % (ref 0.0–3.0)
Eosinophils Absolute: 0 10*3/uL (ref 0.0–0.7)
Eosinophils Relative: 0.2 % (ref 0.0–5.0)
HCT: 35.8 % — ABNORMAL LOW (ref 36.0–46.0)
Hemoglobin: 12 g/dL (ref 12.0–15.0)
Lymphocytes Relative: 34.3 % (ref 12.0–46.0)
Lymphs Abs: 1.1 10*3/uL (ref 0.7–4.0)
MCHC: 33.4 g/dL (ref 30.0–36.0)
MCV: 90.6 fl (ref 78.0–100.0)
Monocytes Absolute: 0.4 10*3/uL (ref 0.1–1.0)
Monocytes Relative: 13.2 % — ABNORMAL HIGH (ref 3.0–12.0)
Neutro Abs: 1.7 10*3/uL (ref 1.4–7.7)
Neutrophils Relative %: 51.9 % (ref 43.0–77.0)
Platelets: 201 10*3/uL (ref 150.0–400.0)
RBC: 3.95 Mil/uL (ref 3.87–5.11)
RDW: 13.7 % (ref 11.5–15.5)
WBC: 3.2 10*3/uL — ABNORMAL LOW (ref 4.0–10.5)

## 2020-04-16 LAB — IBC + FERRITIN
Ferritin: 63.5 ng/mL (ref 10.0–291.0)
Iron: 93 ug/dL (ref 42–145)
Saturation Ratios: 30.8 % (ref 20.0–50.0)
Transferrin: 216 mg/dL (ref 212.0–360.0)

## 2020-04-16 LAB — HEPATIC FUNCTION PANEL
ALT: 7 U/L (ref 0–35)
AST: 14 U/L (ref 0–37)
Albumin: 4.5 g/dL (ref 3.5–5.2)
Alkaline Phosphatase: 62 U/L (ref 39–117)
Bilirubin, Direct: 0.1 mg/dL (ref 0.0–0.3)
Total Bilirubin: 0.4 mg/dL (ref 0.2–1.2)
Total Protein: 7 g/dL (ref 6.0–8.3)

## 2020-04-16 LAB — HEMOGLOBIN A1C: Hgb A1c MFr Bld: 6.1 % (ref 4.6–6.5)

## 2020-04-16 NOTE — Assessment & Plan Note (Signed)
Physical today 04/16/20.  S/p hysterectomy.  Mammogram 11/28/19 - Briads I.  Colonoscopy 05/2012.

## 2020-04-16 NOTE — Progress Notes (Signed)
[name], female   DOB: Aug 22, 1928, 87 y.o.   MRN: [id number]   Subjective:    [name], female    DOB: 1928-06-26, 87 y.o.   MRN: [id number]  HPI This visit occurred during the SARS-CoV-2 public health emergency.  Safety protocols were in place, including screening questions prior to the visit, additional usage of staff PPE, and extensive cleaning of exam room while observing appropriate contact time as indicated for disinfecting solutions.  Patient here for her physical exam.  She was recently placed on crestor.  Had intolerance to the medication.  Stopped taking sometime around the end of February.  Feeling better off crestor.  States it made her more "wobbly".  Not noticing a significant issue now.  No chest pain.  Breathing stable.  Weight remaining stable.  No acid reflux.  No abdominal pain.  Bowels moving.  Previous covid infection.  No residual problems.    Past Medical History:  Diagnosis Date  . Asthma   . Chronic systolic CHF (congestive heart failure) ([place])    a. 01/2015 Echo: EF 25-30%, sev diff HK, mild to mod MR, mildly dil LA, nl RV fxn, PASP 61 mmHg.  . Colon cancer ([place]) 2005  . Hypercholesterolemia   . Hypertension   . Hypertensive heart disease with CHF (congestive heart failure) ([place])   . NICM (nonischemic cardiomyopathy) ([place])    a. 01/2015 Echo: EF 25-30% sev diff HK;  b. 01/2015 Lexi MV: EF 19%, small defect of mild severity in apex - likely breast attenuation, no ischemia.  Marland Kitchen NSVT (nonsustained ventricular tachycardia) ([place])   . Osteoporosis    Past Surgical History:  Procedure Laterality Date  . ABDOMINAL HYSTERECTOMY    . BREAST BIOPSY Left 4/15`   BENIGN BREAST EPITHELIUM WITH NODULAR FIBROSIS.  Marland Kitchen INGUINAL HERNIA REPAIR Left 05/04/2016   Procedure: HERNIA REPAIR INGUINAL ADULT;  Surgeon: Robert Bellow, MD;  Location: [facility] ORS;  Service: General;  Laterality: Left;  . THYROIDECTOMY, PARTIAL  1956  . TUBAL LIGATION      Family History  Problem Relation Age of Onset  . Stroke Mother   . Colon cancer Father   . Breast cancer Daughter 74  . Breast cancer Cousin    Social History   Socioeconomic History  . Marital status: Widowed    Spouse name: Not on file  . Number of children: Not on file  . Years of education: Not on file  . Highest education level: Not on file  Occupational History  . Not on file  Tobacco Use  . Smoking status: Never Smoker  . Smokeless tobacco: Never Used  Vaping Use  . Vaping Use: Never used  Substance and Sexual Activity  . Alcohol use: No    Alcohol/week: 0.0 standard drinks  . Drug use: No  . Sexual activity: Not on file  Other Topics Concern  . Not on file  Social History Narrative  . Not on file   Social Determinants of Health   Financial Resource Strain: Low Risk   . Difficulty of Paying Living Expenses: Not hard at all  Food Insecurity: No Food Insecurity  . Worried About Charity fundraiser in the Last Year: Never true  . Ran Out of Food in the Last Year: Never true  Transportation Needs: No Transportation Needs  . Lack of Transportation (Medical): No  . Lack of Transportation (Non-Medical): No  Physical Activity: Not on file  Stress: No Stress Concern  Present  . Feeling of Stress : Not at all  Social Connections: Unknown  . Frequency of Communication with Friends and Family: More than three times a week  . Frequency of Social Gatherings with Friends and Family: More than three times a week  . Attends Religious Services: Not on file  . Active Member of Clubs or Organizations: Not on file  . Attends Archivist Meetings: Not on file  . Marital Status: Widowed    Outpatient Encounter Medications as of 04/16/2020  Medication Sig  . acetaminophen (TYLENOL) 325 MG tablet Take 650 mg by mouth every 6 (six) hours as needed.  Marland Kitchen albuterol (VENTOLIN HFA) 108 (90 Base) MCG/ACT inhaler Inhale 2 puffs into the lungs every 6 (six) hours as needed  for wheezing or shortness of breath.  Marland Kitchen aspirin 81 MG EC tablet Take 81 mg by mouth daily. Swallow whole.  . Cholecalciferol (VITAMIN D-3) 1000 UNITS CAPS Take 2,000 Units by mouth daily.  . furosemide (LASIX) 40 MG tablet Take one tablet  (40MG) if weight is greater than 130.  . isosorbide mononitrate (IMDUR) 30 MG 24 hr tablet Take 1 tablet (30 mg total) by mouth daily.  . metoprolol succinate (TOPROL-XL) 25 MG 24 hr tablet Take 1 tablet (25 mg total) by mouth daily.  . sacubitril-valsartan (ENTRESTO) 97-103 MG Take 1 tablet by mouth 2 (two) times daily.  Marland Kitchen senna-docusate (SENOKOT-S) 8.6-50 MG tablet Take 2 tablets by mouth 2 (two) times daily as needed for mild constipation.  Marland Kitchen spironolactone (ALDACTONE) 25 MG tablet Take 1 tablet (25 mg total) by mouth daily.  . rosuvastatin (CRESTOR) 5 MG tablet Take 5 mg by mouth every other day. (Patient not taking: Reported on 04/16/2020)   No facility-administered encounter medications on file as of 04/16/2020.    Review of Systems  Constitutional: Negative for appetite change and unexpected weight change.  HENT: Negative for congestion and sinus pressure.   Respiratory: Negative for cough and chest tightness.        Breathing stable.   Cardiovascular: Negative for chest pain, palpitations and leg swelling.  Gastrointestinal: Negative for abdominal pain, diarrhea, nausea and vomiting.  Genitourinary: Negative for difficulty urinating and dysuria.  Musculoskeletal: Negative for joint swelling and myalgias.  Skin: Negative for color change and rash.  Neurological: Negative for dizziness, light-headedness and headaches.  Psychiatric/Behavioral: Negative for agitation and dysphoric mood.       Objective:    Physical Exam Vitals reviewed.  Constitutional:      General: She is not in acute distress.    Appearance: Normal appearance.  HENT:     Head: Normocephalic and atraumatic.     Right Ear: External ear normal.     Left Ear: External ear  normal.  Eyes:     General: No scleral icterus.       Right eye: No discharge.        Left eye: No discharge.     Conjunctiva/sclera: Conjunctivae normal.  Neck:     Thyroid: No thyromegaly.  Cardiovascular:     Rate and Rhythm: Normal rate and regular rhythm.  Pulmonary:     Effort: No respiratory distress.     Breath sounds: Normal breath sounds. No wheezing.  Abdominal:     General: Bowel sounds are normal.     Palpations: Abdomen is soft.     Tenderness: There is no abdominal tenderness.  Musculoskeletal:        General: No swelling or tenderness.  Cervical back: Neck supple. No tenderness.  Lymphadenopathy:     Cervical: No cervical adenopathy.  Skin:    Findings: No erythema or rash.  Neurological:     Mental Status: She is alert.  Psychiatric:        Mood and Affect: Mood normal.        Behavior: Behavior normal.     BP 134/78   Pulse 88   Temp 98.1 F (36.7 C) (Oral)   Ht 5' 2"  (1.575 m)   Wt 129 lb 12.8 oz (58.9 kg)   LMP 02/21/1966   SpO2 91%   BMI 23.74 kg/m  Wt Readings from Last 3 Encounters:  04/16/20 129 lb 12.8 oz (58.9 kg)  02/21/20 130 lb (59 kg)  12/14/19 132 lb 12.8 oz (60.2 kg)     Lab Results  Component Value Date   WBC 3.2 (L) 04/16/2020   HGB 12.0 04/16/2020   HCT 35.8 (L) 04/16/2020   PLT 201.0 04/16/2020   GLUCOSE 125 (H) 04/16/2020   CHOL 223 (H) 04/16/2020   TRIG 64.0 04/16/2020   HDL 75.80 04/16/2020   LDLDIRECT 128.3 02/08/2013   LDLCALC 134 (H) 04/16/2020   ALT 7 04/16/2020   AST 14 04/16/2020   NA 140 04/16/2020   K 4.6 04/16/2020   CL 101 04/16/2020   CREATININE 1.20 04/16/2020   BUN 26 (H) 04/16/2020   CO2 31 04/16/2020   TSH 0.98 12/12/2019   INR 0.9 06/28/2019   HGBA1C 6.1 04/16/2020    MM 3D SCREEN BREAST BILATERAL  Result Date: 11/28/2019 CLINICAL DATA:  Screening. EXAM: DIGITAL SCREENING BILATERAL MAMMOGRAM WITH TOMO AND CAD COMPARISON:  Previous exam(s). ACR Breast Density Category c: The breast  tissue is heterogeneously dense, which may obscure small masses. FINDINGS: There are no findings suspicious for malignancy. Images were processed with CAD. IMPRESSION: No mammographic evidence of malignancy. A result letter of this screening mammogram will be mailed directly to the patient. RECOMMENDATION: Screening mammogram in one year. (Code:SM-B-01Y) BI-RADS CATEGORY  1: Negative. Electronically Signed   By: Kristopher Oppenheim M.D.   On: 11/28/2019 09:35       Assessment & Plan:   Problem List Items Addressed This Visit    Anemia   Relevant Orders   CBC with Differential/Platelet (Completed)   Vitamin B12 (Completed)   IBC + Ferritin (Completed)   CKD (chronic kidney disease), stage III (HCC)    Avoid antiinflammatories.  Follow metabolic panel.       Essential hypertension    Continue aldactone, entresto, metoprolol and lasix.  Follow pressures.  Follow metabolic panel.       Relevant Orders   Basic metabolic panel (Completed)   Health care maintenance    Physical today 04/16/20.  S/p hysterectomy.  Mammogram 11/28/19 - [facility].  Colonoscopy 05/2012.       History of colon cancer    Recently evaluated by oncology for increase CEA.  PET ok.  Follow.       Hypercholesterolemia    Was on crestor. Intolerance.  Off now.  Follow lipid panel.       Relevant Orders   Hepatic function panel (Completed)   Lipid panel (Completed)   Hyperglycemia    Follow met b and a1c.       Relevant Orders   Hemoglobin A1c (Completed)   Hypertensive heart disease with CHF (congestive heart failure) ([place])    Weighs herself daily.  No evidence of volume overload today.  Continue entresto,  aldactone, lasix and metoprolol.  Stable.       Leukopenia    Follow cbc.       NICM (nonischemic cardiomyopathy) ([place]) - Primary   NSVT (nonsustained ventricular tachycardia) ([place])    Documented per cardiology. On metoprolol. Stable.           Einar Pheasant, MD

## 2020-04-17 ENCOUNTER — Telehealth: Payer: Self-pay

## 2020-04-17 ENCOUNTER — Other Ambulatory Visit: Payer: Self-pay | Admitting: Internal Medicine

## 2020-04-17 DIAGNOSIS — D72819 Decreased white blood cell count, unspecified: Secondary | ICD-10-CM

## 2020-04-17 NOTE — Progress Notes (Signed)
Order placed for f/u cbc.   

## 2020-04-17 NOTE — Telephone Encounter (Signed)
Pt called back about lab results.  

## 2020-04-19 ENCOUNTER — Encounter: Payer: Self-pay | Admitting: Internal Medicine

## 2020-04-19 NOTE — Assessment & Plan Note (Signed)
Was on crestor. Intolerance.  Off now.  Follow lipid panel.  

## 2020-04-19 NOTE — Assessment & Plan Note (Signed)
Weighs herself daily.  No evidence of volume overload today.  Continue entresto, aldactone, lasix and metoprolol.  Stable.

## 2020-04-19 NOTE — Assessment & Plan Note (Signed)
Follow met b and a1c.  

## 2020-04-19 NOTE — Assessment & Plan Note (Signed)
Avoid antiinflammatories.  Follow metabolic panel.  

## 2020-04-19 NOTE — Assessment & Plan Note (Signed)
Documented per cardiology. On metoprolol. Stable.  

## 2020-04-19 NOTE — Assessment & Plan Note (Signed)
Follow cbc.  

## 2020-04-19 NOTE — Assessment & Plan Note (Signed)
Recently evaluated by oncology for increase CEA.  PET ok.  Follow.  

## 2020-04-19 NOTE — Assessment & Plan Note (Signed)
Continue aldactone, entresto, metoprolol and lasix.  Follow pressures. Follow metabolic panel.  

## 2020-04-21 ENCOUNTER — Telehealth: Payer: Self-pay | Admitting: Internal Medicine

## 2020-04-21 NOTE — Telephone Encounter (Signed)
Patient called in stated that she has not had a flu shot

## 2020-04-21 NOTE — Telephone Encounter (Signed)
Spoke with pt regarding this. Patient thought she had the flu shot but it was her booster shot. I have updated her health maintenance

## 2020-06-10 ENCOUNTER — Telehealth: Payer: Self-pay

## 2020-06-10 NOTE — Telephone Encounter (Signed)
Received a fax from [employer] stating pt "does not meet the eligibility criteria for enrollment due to the following reason: Prescription Drug Coverage Exists   However, pt was approved for PA for Entresto in January 2022 for the upcoming 2022 year.   Patient ID: [id number]  Called and spoke to a [employer] patient representative, she advised that this was an error, PA will not expire in 3 days, her coverage will last the remaining of the 2022 year. She will forward this error to the "processing team" to have corrected.

## 2020-06-17 ENCOUNTER — Other Ambulatory Visit: Payer: Medicare HMO

## 2020-06-18 ENCOUNTER — Other Ambulatory Visit: Payer: Self-pay | Admitting: Cardiovascular Disease

## 2020-06-26 ENCOUNTER — Other Ambulatory Visit (INDEPENDENT_AMBULATORY_CARE_PROVIDER_SITE_OTHER): Payer: Medicare HMO

## 2020-06-26 ENCOUNTER — Other Ambulatory Visit: Payer: Self-pay

## 2020-06-26 DIAGNOSIS — D72819 Decreased white blood cell count, unspecified: Secondary | ICD-10-CM

## 2020-06-26 LAB — CBC WITH DIFFERENTIAL/PLATELET
Basophils Absolute: 0 10*3/uL (ref 0.0–0.1)
Basophils Relative: 0.4 % (ref 0.0–3.0)
Eosinophils Absolute: 0 10*3/uL (ref 0.0–0.7)
Eosinophils Relative: 0.3 % (ref 0.0–5.0)
HCT: 37.7 % (ref 36.0–46.0)
Hemoglobin: 12.7 g/dL (ref 12.0–15.0)
Lymphocytes Relative: 37.1 % (ref 12.0–46.0)
Lymphs Abs: 1.4 10*3/uL (ref 0.7–4.0)
MCHC: 33.6 g/dL (ref 30.0–36.0)
MCV: 89.7 fl (ref 78.0–100.0)
Monocytes Absolute: 0.4 10*3/uL (ref 0.1–1.0)
Monocytes Relative: 11 % (ref 3.0–12.0)
Neutro Abs: 1.9 10*3/uL (ref 1.4–7.7)
Neutrophils Relative %: 51.2 % (ref 43.0–77.0)
Platelets: 190 10*3/uL (ref 150.0–400.0)
RBC: 4.21 Mil/uL (ref 3.87–5.11)
RDW: 13.3 % (ref 11.5–15.5)
WBC: 3.7 10*3/uL — ABNORMAL LOW (ref 4.0–10.5)

## 2020-07-14 ENCOUNTER — Other Ambulatory Visit: Payer: Self-pay | Admitting: Cardiovascular Disease

## 2020-07-23 ENCOUNTER — Other Ambulatory Visit: Payer: Self-pay | Admitting: Internal Medicine

## 2020-09-16 ENCOUNTER — Telehealth: Payer: Self-pay | Admitting: Internal Medicine

## 2020-09-16 NOTE — Telephone Encounter (Signed)
Pt would like to know if Dr. Nicki Reaper would be willing to take on her sister in law as a new patient.

## 2020-09-16 NOTE — Telephone Encounter (Signed)
PT called to advise they have a medical concern/question for Dr.Scott and would like a callback. Did not provide any further info.

## 2020-09-17 NOTE — Telephone Encounter (Signed)
Ok to take as new pt.

## 2020-10-14 ENCOUNTER — Other Ambulatory Visit: Payer: Self-pay | Admitting: Cardiovascular Disease

## 2020-10-14 NOTE — Telephone Encounter (Signed)
Please schedule overdue F/U appointment. Thank you! ?

## 2020-10-16 NOTE — Progress Notes (Signed)
Cardiology Office Note    Date:  10/17/2020   ID:  Kathy Dominguez, DOB 08/21/1928, MRN [id number]  PCP:  Einar Pheasant, MD  Cardiologist:  Ida Rogue, MD  Electrophysiologist:  None   Chief Complaint: Follow-up  History of Present Illness:   Kathy Dominguez is a 87 y.o. female with history of HFrEF secondary to NICM, hypertensive heart disease, Covid in 01/2020, PAD, aortic atherosclerosis, HLD, and osteoporosis who presents for follow-up of her cardiomyopathy.   She was admitted to the hospital in 01/2016 with progressive dyspnea and found to have CHF with new LV dysfunction.  Echo showed an EF of 25 to 30%, diffuse hypokinesis, normal LV diastolic function parameters, mild to moderate mitral regurgitation, mildly dilated left atrium, normal RV systolic function, and PASP 61 mmHg.  Cardiac enzymes were negative.  She was diuresed and underwent Lexiscan MPI which showed no significant ischemia.  She was placed on GDMT.  Follow-up echo in 09/2016 showed near normalization of LV systolic function with an EF of 50 to 55%, no regional wall motion abnormalities, mild concentric LVH, grade 1 diastolic dysfunction, and normal PASP.  CT images from 2018 showed PAD most notably involving the iliac vessels along with aortic atherosclerosis.  She was last seen in the office in 01/2020 and was doing well from a cardiac perspective.  She had not needed any as needed Lasix.  No changes were indicated.  She comes in doing well from a cardiac perspective.  No chest pain.  She does note some shortness of breath that improves with her albuterol inhaler.  Otherwise, she denies any dyspnea.  No palpitations, dizziness, presyncope, syncope, lower extremity swelling, abdominal distention, orthopnea, PND, or early satiety.  No falls, hematochezia, or melena.  She self discontinued rosuvastatin secondary to lower extremity myalgias.  With this, these myalgias improved.  She prefers to remain off cholesterol  medication at this time.  Otherwise, she does not have any issues or concerns.   Labs independently reviewed: 06/2020 - Hgb 12.7, PLT 190 03/2020 - potassium 4.6, BUN 26, serum creatinine 1.2, TC 223, TG 64, HDL 75, LDL 134, albumin 4.5, AST/ALT normal, A1c 6.1 11/2019 - TSH normal  Past Medical History:  Diagnosis Date   Asthma    Chronic systolic CHF (congestive heart failure) ([place])    a. 01/2015 Echo: EF 25-30%, sev diff HK, mild to mod MR, mildly dil LA, nl RV fxn, PASP 61 mmHg.   Colon cancer ([place]) 2005   Hypercholesterolemia    Hypertension    Hypertensive heart disease with CHF (congestive heart failure) ([place])    NICM (nonischemic cardiomyopathy) ([place])    a. 01/2015 Echo: EF 25-30% sev diff HK;  b. 01/2015 Lexi MV: EF 19%, small defect of mild severity in apex - likely breast attenuation, no ischemia.   NSVT (nonsustained ventricular tachycardia) ([facility])    Osteoporosis     Past Surgical History:  Procedure Laterality Date   ABDOMINAL HYSTERECTOMY     BREAST BIOPSY Left 4/15`   BENIGN BREAST EPITHELIUM WITH NODULAR FIBROSIS.   INGUINAL HERNIA REPAIR Left 05/04/2016   Procedure: HERNIA REPAIR INGUINAL ADULT;  Surgeon: Robert Bellow, MD;  Location: [facility] ORS;  Service: General;  Laterality: Left;   THYROIDECTOMY, PARTIAL  1956   TUBAL LIGATION      Current Medications: Current Meds  Medication Sig   acetaminophen (TYLENOL) 325 MG tablet Take 650 mg by mouth every 6 (six) hours as needed.   albuterol (VENTOLIN  HFA) 108 (90 Base) MCG/ACT inhaler Inhale 2 puffs into the lungs every 6 (six) hours as needed for wheezing or shortness of breath.   aspirin 81 MG EC tablet Take 81 mg by mouth daily. Swallow whole.   Cholecalciferol (VITAMIN D-3) 1000 UNITS CAPS Take 2,000 Units by mouth daily.   isosorbide mononitrate (IMDUR) 30 MG 24 hr tablet Take 1 tablet (30 mg total) by mouth daily.   metoprolol succinate (TOPROL-XL) 25 MG 24 hr tablet Take 1 tablet (25 mg total) by mouth daily.  Pt needs to keep upcoming appt in sept for further refills   sacubitril-valsartan (ENTRESTO) 97-103 MG Take 1 tablet by mouth 2 (two) times daily.   senna-docusate (SENOKOT-S) 8.6-50 MG tablet Take 2 tablets by mouth 2 (two) times daily as needed for mild constipation.   spironolactone (ALDACTONE) 25 MG tablet Take 1 tablet (25 mg total) by mouth daily.    Allergies:   Patient has no known allergies.   Social History   Socioeconomic History   Marital status: Widowed    Spouse name: Not on file   Number of children: Not on file   Years of education: Not on file   Highest education level: Not on file  Occupational History   Not on file  Tobacco Use   Smoking status: Never   Smokeless tobacco: Never  Vaping Use   Vaping Use: Never used  Substance and Sexual Activity   Alcohol use: No    Alcohol/week: 0.0 standard drinks   Drug use: No   Sexual activity: Not on file  Other Topics Concern   Not on file  Social History Narrative   Not on file   Social Determinants of Health   Financial Resource Strain: Low Risk    Difficulty of Paying Living Expenses: Not hard at all  Food Insecurity: No Food Insecurity   Worried About Charity fundraiser in the Last Year: Never true   [place] in the Last Year: Never true  Transportation Needs: No Transportation Needs   Lack of Transportation (Medical): No   Lack of Transportation (Non-Medical): No  Physical Activity: Not on file  Stress: No Stress Concern Present   Feeling of Stress : Not at all  Social Connections: Unknown   Frequency of Communication with Friends and Family: More than three times a week   Frequency of Social Gatherings with Friends and Family: More than three times a week   Attends Religious Services: Not on file   Active Member of Clubs or Organizations: Not on file   Attends Archivist Meetings: Not on file   Marital Status: Widowed     Family History:  The patient's family history includes  Breast cancer in her cousin; Breast cancer (age of onset: 76) in her daughter; Colon cancer in her father; Stroke in her mother.  ROS:   Review of Systems  Constitutional:  Positive for malaise/fatigue. Negative for chills, diaphoresis, fever and weight loss.  HENT:  Negative for congestion.   Eyes:  Negative for discharge and redness.  Respiratory:  Negative for cough, sputum production, shortness of breath and wheezing.   Cardiovascular:  Negative for chest pain, palpitations, orthopnea, claudication, leg swelling and PND.  Gastrointestinal:  Negative for abdominal pain, blood in stool, heartburn, melena, nausea and vomiting.  Musculoskeletal:  Positive for joint pain. Negative for falls and myalgias.  Skin:  Negative for rash.  Neurological:  Negative for dizziness, tingling, tremors, sensory change, speech change,  focal weakness, loss of consciousness and weakness.  Endo/Heme/Allergies:  Does not bruise/bleed easily.  Psychiatric/Behavioral:  Negative for substance abuse. The patient is not nervous/anxious.   All other systems reviewed and are negative.   EKGs/Labs/Other Studies Reviewed:    Studies reviewed were summarized above. The additional studies were reviewed today:  2D echo 09/2016: - Left ventricle: The cavity size was normal. There was mild    concentric hypertrophy. Systolic function was normal. The    estimated ejection fraction was in the range of 50% to 55%. Wall    motion was normal; there were no regional wall motion    abnormalities. Doppler parameters are consistent with abnormal    left ventricular relaxation (grade 1 diastolic dysfunction).  - Pulmonary arteries: Systolic pressure was within the normal    range.   Impressions:   - EF improved to low normal and pulmonary pressure normalized. __________   2D echo 01/2016: - Left ventricle: The cavity size was mildly dilated. Systolic    function was severely reduced. The estimated ejection fraction    was  in the range of 25% to 30%. Severe diffuse hypokinesis.    Regional wall motion abnormalities cannot be excluded. Left    ventricular diastolic function parameters were normal.  - Mitral valve: There was mild to moderate regurgitation.  - Left atrium: The atrium was mildly dilated.  - Right ventricle: Systolic function was normal.  - Pulmonary arteries: Systolic pressure was moderate to severely    elevated. PA peak pressure: 61 mm Hg (S). __________   Carlton Adam MPI 01/2016: There was no ST segment deviation noted during stress. Defect 1: There is a small defect of mild severity present in the apex location. This is likely due to breast attenuation. This is a high risk study due to low EF. Nuclear stress EF: 19%. No evidence of ischemia on this study.   EKG:  EKG is ordered today.  The EKG ordered today demonstrates NSR, 73 bpm, left anterior fascicular block, LVH, no acute ST-T changes  Recent Labs: 11/23/2019: B Natriuretic Peptide 98.9 12/12/2019: TSH 0.98 04/16/2020: ALT 7; BUN 26; Creatinine, Ser 1.20; Potassium 4.6; Sodium 140 06/26/2020: Hemoglobin 12.7; Platelets 190.0  Recent Lipid Panel    Component Value Date/Time   CHOL 223 (H) 04/16/2020 1102   TRIG 64.0 04/16/2020 1102   HDL 75.80 04/16/2020 1102   CHOLHDL 3 04/16/2020 1102   VLDL 12.8 04/16/2020 1102   LDLCALC 134 (H) 04/16/2020 1102   LDLDIRECT 128.3 02/08/2013 0944    PHYSICAL EXAM:    VS:  BP 110/80 (BP Location: Left Arm, Patient Position: Sitting, Cuff Size: Normal)   Pulse 73   Ht 5\' 2"  (1.575 m)   Wt 128 lb (58.1 kg)   LMP 02/21/1966   SpO2 91%   BMI 23.41 kg/m   BMI: Body mass index is 23.41 kg/m.  Physical Exam Vitals reviewed.  Constitutional:      Appearance: She is well-developed.  HENT:     Head: Normocephalic and atraumatic.  Eyes:     General:        Right eye: No discharge.        Left eye: No discharge.  Neck:     Vascular: No JVD.  Cardiovascular:     Rate and Rhythm: Normal  rate and regular rhythm.     Pulses:          Posterior tibial pulses are 2+ on the right side and 2+ on the left  side.     Heart sounds: Normal heart sounds, S1 normal and S2 normal. Heart sounds not distant. No midsystolic click and no opening snap. No murmur heard.   No friction rub.  Pulmonary:     Effort: Pulmonary effort is normal. No respiratory distress.     Breath sounds: Normal breath sounds. No decreased breath sounds, wheezing or rales.  Chest:     Chest wall: No tenderness.  Abdominal:     General: There is no distension.     Palpations: Abdomen is soft.     Tenderness: There is no abdominal tenderness.  Musculoskeletal:     Cervical back: Normal range of motion.     Right lower leg: No edema.     Left lower leg: No edema.  Skin:    General: Skin is warm and dry.     Nails: There is no clubbing.  Neurological:     Mental Status: She is alert and oriented to person, place, and time.  Psychiatric:        Speech: Speech normal.        Behavior: Behavior normal.        Thought Content: Thought content normal.        Judgment: Judgment normal.    Wt Readings from Last 3 Encounters:  10/17/20 128 lb (58.1 kg)  04/16/20 129 lb 12.8 oz (58.9 kg)  02/21/20 130 lb (59 kg)     ASSESSMENT & PLAN:   HFrEF secondary to NICM: She appears euvolemic and well compensated.  Most recent echo, in 2018, demonstrated improvement in her LV systolic function with a low normal EF at 50 to 55%.  Continue GDMT including Toprol-XL, Entresto, and spironolactone.  She has not needed any as needed furosemide since she was last seen.  Recent renal function stable as outlined above.  Given improvement in LV systolic function and advanced age, we will defer addition of SGLT2i at this time in an effort to minimize risk of dizziness and falls.  CHF education.  HTN: Blood pressure is well controlled in the office today.  Continue current medical therapy as outlined above.  HLD: LDL 134 in 03/2020.   Goal LDL less than 70.  She self discontinued rosuvastatin secondary to lower extremity myalgias with noted improvement in symptoms thereafter.  She prefers to avoid alternative cholesterol-lowering therapies such as ezetimibe.  Given her advanced age I think this is reasonable.  Recommend heart healthy diet.  PAD/aortic atherosclerosis: Noted on CT imaging in 2018.  No symptoms of claudication or critical limb ischemia.  She remains on aspirin.  No longer on statin as outlined above.  Disposition: F/u with Dr. Rockey Situ or an APP in 6 months.   Medication Adjustments/Labs and Tests Ordered: Current medicines are reviewed at length with the patient today.  Concerns regarding medicines are outlined above. Medication changes, Labs and Tests ordered today are summarized above and listed in the Patient Instructions accessible in Encounters.   Signed, Christell Faith, PA-C 10/17/2020 2:30 PM     [address], [place] [address] [phone]

## 2020-10-17 ENCOUNTER — Other Ambulatory Visit: Payer: Self-pay

## 2020-10-17 ENCOUNTER — Ambulatory Visit: Payer: Medicare HMO | Admitting: Physician Assistant

## 2020-10-17 ENCOUNTER — Encounter: Payer: Self-pay | Admitting: Physician Assistant

## 2020-10-17 VITALS — BP 110/80 | HR 73 | Ht 62.0 in | Wt 128.0 lb

## 2020-10-17 DIAGNOSIS — E785 Hyperlipidemia, unspecified: Secondary | ICD-10-CM | POA: Diagnosis not present

## 2020-10-17 DIAGNOSIS — I739 Peripheral vascular disease, unspecified: Secondary | ICD-10-CM

## 2020-10-17 DIAGNOSIS — I428 Other cardiomyopathies: Secondary | ICD-10-CM | POA: Diagnosis not present

## 2020-10-17 DIAGNOSIS — I700 Atherosclerosis of aorta: Secondary | ICD-10-CM

## 2020-10-17 DIAGNOSIS — I5022 Chronic systolic (congestive) heart failure: Secondary | ICD-10-CM

## 2020-10-17 DIAGNOSIS — I10 Essential (primary) hypertension: Secondary | ICD-10-CM

## 2020-10-17 NOTE — Patient Instructions (Signed)
Medication Instructions:  Your physician recommends that you continue on your current medications as directed. Please refer to the Current Medication list given to you today.  *If you need a refill on your cardiac medications before your next appointment, please call your pharmacy*   Lab Work: None ordered  If you have labs (blood work) drawn today and your tests are completely normal, you will receive your results only by: [place] (if you have MyChart) OR A paper copy in the mail If you have any lab test that is abnormal or we need to change your treatment, we will call you to review the results.   Testing/Procedures: None ordered   Follow-Up: At [facility], you and your health needs are our priority.  As part of our continuing mission to provide you with exceptional heart care, we have created designated Provider Care Teams.  These Care Teams include your primary Cardiologist (physician) and Advanced Practice Providers (APPs -  Physician Assistants and Nurse Practitioners) who all work together to provide you with the care you need, when you need it.  We recommend signing up for the patient portal called "MyChart".  Sign up information is provided on this After Visit Summary.  MyChart is used to connect with patients for Virtual Visits (Telemedicine).  Patients are able to view lab/test results, encounter notes, upcoming appointments, etc.  Non-urgent messages can be sent to your provider as well.   To learn more about what you can do with MyChart, go to [email or web address].    Your next appointment:   6 month(s)  The format for your next appointment:   In Person  Provider:   You may see Ida Rogue, MD or one of the following Advanced Practice Providers on your designated Care Team:   Murray Hodgkins, NP Christell Faith, PA-C Marrianne Mood, PA-C Cadence Kathlen Mody, [place]   Other Instructions

## 2020-10-22 ENCOUNTER — Other Ambulatory Visit: Payer: Self-pay | Admitting: Internal Medicine

## 2020-10-22 DIAGNOSIS — Z1231 Encounter for screening mammogram for malignant neoplasm of breast: Secondary | ICD-10-CM

## 2020-10-28 ENCOUNTER — Other Ambulatory Visit: Payer: Self-pay | Admitting: Internal Medicine

## 2020-11-15 ENCOUNTER — Other Ambulatory Visit: Payer: Self-pay | Admitting: Cardiovascular Disease

## 2020-11-26 ENCOUNTER — Ambulatory Visit (INDEPENDENT_AMBULATORY_CARE_PROVIDER_SITE_OTHER): Payer: Medicare HMO

## 2020-11-26 VITALS — Ht 62.0 in | Wt 128.0 lb

## 2020-11-26 DIAGNOSIS — Z0000 Encounter for general adult medical examination without abnormal findings: Secondary | ICD-10-CM | POA: Diagnosis not present

## 2020-11-26 NOTE — Progress Notes (Signed)
Subjective:   Kathy Dominguez is a 87 y.o. female who presents for Medicare Annual (Subsequent) preventive examination.  Review of Systems    No ROS.  Cardiac Risk Factors include: advanced age (>92men, >77 women);hypertension     Objective:    Today's Vitals   11/26/20 0904  Weight: 128 lb (58.1 kg)  Height: 5\' 2"  (1.575 m)   Body mass index is 23.41 kg/m.  Advanced Directives 11/26/2020 11/26/2019 11/23/2019 11/23/2019 06/28/2019 06/28/2019 11/23/2018  Does Patient Have a Medical Advance Directive? No No No No No No No  Would patient like information on creating a medical advance directive? No - Patient declined No - Patient declined - - No - Patient declined - No - Patient declined    Current Medications (verified) Outpatient Encounter Medications as of 11/26/2020  Medication Sig   acetaminophen (TYLENOL) 325 MG tablet Take 650 mg by mouth every 6 (six) hours as needed.   albuterol (VENTOLIN HFA) 108 (90 Base) MCG/ACT inhaler Inhale 2 puffs into the lungs every 6 (six) hours as needed for wheezing or shortness of breath.   aspirin 81 MG EC tablet Take 81 mg by mouth daily. Swallow whole.   Cholecalciferol (VITAMIN D-3) 1000 UNITS CAPS Take 2,000 Units by mouth daily.   isosorbide mononitrate (IMDUR) 30 MG 24 hr tablet Take 1 tablet (30 mg total) by mouth daily.   metoprolol succinate (TOPROL-XL) 25 MG 24 hr tablet Take 1 tablet (25 mg total) by mouth daily.   sacubitril-valsartan (ENTRESTO) 97-103 MG Take 1 tablet by mouth 2 (two) times daily.   senna-docusate (SENOKOT-S) 8.6-50 MG tablet Take 2 tablets by mouth 2 (two) times daily as needed for mild constipation.   spironolactone (ALDACTONE) 25 MG tablet Take 1 tablet (25 mg total) by mouth daily.   furosemide (LASIX) 40 MG tablet Take one tablet  (40MG ) if weight is greater than 130. (Patient not taking: Reported on 11/26/2020)   No facility-administered encounter medications on file as of 11/26/2020.    Allergies  (verified) Patient has no known allergies.   History: Past Medical History:  Diagnosis Date   Asthma    Chronic systolic CHF (congestive heart failure) ([place])    a. 01/2015 Echo: EF 25-30%, sev diff HK, mild to mod MR, mildly dil LA, nl RV fxn, PASP 61 mmHg.   Colon cancer ([place]) 2005   Hypercholesterolemia    Hypertension    Hypertensive heart disease with CHF (congestive heart failure) ([place])    NICM (nonischemic cardiomyopathy) ([place])    a. 01/2015 Echo: EF 25-30% sev diff HK;  b. 01/2015 Lexi MV: EF 19%, small defect of mild severity in apex - likely breast attenuation, no ischemia.   NSVT (nonsustained ventricular tachycardia)    Osteoporosis    Past Surgical History:  Procedure Laterality Date   ABDOMINAL HYSTERECTOMY     BREAST BIOPSY Left 4/15`   BENIGN BREAST EPITHELIUM WITH NODULAR FIBROSIS.   INGUINAL HERNIA REPAIR Left 05/04/2016   Procedure: HERNIA REPAIR INGUINAL ADULT;  Surgeon: Robert Bellow, MD;  Location: [facility] ORS;  Service: General;  Laterality: Left;   THYROIDECTOMY, PARTIAL  1956   TUBAL LIGATION     Family History  Problem Relation Age of Onset   Stroke Mother    Colon cancer Father    Breast cancer Daughter 77   Breast cancer Cousin    Social History   Socioeconomic History   Marital status: Widowed    Spouse name: Not on file  Number of children: Not on file   Years of education: Not on file   Highest education level: Not on file  Occupational History   Not on file  Tobacco Use   Smoking status: Never   Smokeless tobacco: Never  Vaping Use   Vaping Use: Never used  Substance and Sexual Activity   Alcohol use: No    Alcohol/week: 0.0 standard drinks   Drug use: No   Sexual activity: Not on file  Other Topics Concern   Not on file  Social History Narrative   Not on file   Social Determinants of Health   Financial Resource Strain: Low Risk    Difficulty of Paying Living Expenses: Not hard at all  Food Insecurity: No Food Insecurity    Worried About Charity fundraiser in the Last Year: Never true   [place] in the Last Year: Never true  Transportation Needs: No Transportation Needs   Lack of Transportation (Medical): No   Lack of Transportation (Non-Medical): No  Physical Activity: Insufficiently Active   Days of Exercise per Week: 1 day   Minutes of Exercise per Session: 10 min  Stress: No Stress Concern Present   Feeling of Stress : Not at all  Social Connections: Unknown   Frequency of Communication with Friends and Family: More than three times a week   Frequency of Social Gatherings with Friends and Family: Twice a week   Attends Religious Services: More than 4 times per year   Active Member of [employer] or Organizations: No   Attends Music therapist: Never   Marital Status: Not on file   Clinical Intake:  Pre-visit preparation completed: Yes    Diabetes: No  How often do you need to have someone help you when you read instructions, pamphlets, or other written materials from your doctor or pharmacy?: 1 - Never  Diabetic? No. Virtual Visit via Telephone Note  Location: Patient: Home  Provider: Office Persons participating in the virtual visit: patient/Nurse Health Advisor   I discussed the limitations, risks, security and privacy concerns of performing an evaluation and management service by telephone and the availability of in person appointments. The patient expressed understanding and agreed to proceed.  Interactive audio and video telecommunications were attempted between this nurse and patient, however failed, due to patient having technical difficulties OR patient did not have access to video capability.  We continued and completed visit with audio only.  Some vital signs may be absent or patient reported.   Criselda Peaches, LPN  Interpreter Needed?: No      Activities of Daily Living In your present state of health, do you have any difficulty performing the following  activities: 11/26/2020  Hearing? N  Vision? N  Comment Wears Glasses  Difficulty concentrating or making decisions? N  Walking or climbing stairs? N  Dressing or bathing? N  Doing errands, shopping? N  Preparing Food and eating ? N  Using the Toilet? N  In the past six months, have you accidently leaked urine? N  Comment Wears pads  on occasions  Do you have problems with loss of bowel control? N  Managing your Medications? N  Managing your Finances? N  Housekeeping or managing your Housekeeping? N  Some recent data might be hidden    Patient Care Team: Einar Pheasant, MD as PCP - General (Internal Medicine) Minna Merritts, MD as PCP - Cardiology (Cardiology) Byrnett, Forest Gleason, MD (General Surgery) Einar Pheasant, MD (  Internal Medicine) Alisa Graff, FNP as Nurse Practitioner (Family Medicine) Erby Pian, MD as Referring Physician (Pulmonary Disease) Minna Merritts, MD as Consulting Physician (Cardiology)  Indicate any recent Medical Services you may have received from other than [facility] providers in the past year (date may be approximate).     Assessment:   This is a routine wellness examination for [facility].  Virtual Visit via Telephone Note  I connected with  Kathy Dominguez on 11/26/20 at  9:00 AM EDT by telephone and verified that I am speaking with the correct person using two identifiers.  Location: Patient: Home Provider: Office Persons participating in the virtual visit: patient/Nurse Health Advisor   I discussed the limitations, risks, security and privacy concerns of performing an evaluation and management service by telephone and the availability of in person appointments. The patient expressed understanding and agreed to proceed.  Interactive audio and video telecommunications were attempted between this nurse and patient, however failed, due to patient having technical difficulties OR patient did not have access to video capability.  We  continued and completed visit with audio only.  Some vital signs may be absent or patient reported.   Criselda Peaches, LPN   Hearing/Vision screen Hearing Screening - Comments:: No difficulty hearing Vision Screening - Comments:: Wears Glasses. Followed by [place] issues and exercise activities discussed:  Diet-Regular.   Goals Addressed             This Visit's Progress    Maintain Healthy lifestyle        Patient to maintain healthy lifestyle.       Depression Screen PHQ 2/9 Scores 11/26/2020 04/16/2020 11/26/2019 11/23/2018 08/30/2017 08/23/2017 05/17/2016  PHQ - 2 Score 0 0 0 0 0 0 0    Fall Risk Fall Risk  11/26/2020 04/16/2020 11/26/2019 11/23/2018 08/30/2017  Falls in the past year? 0 1 0 0 No  Number falls in past yr: 0 0 0 0 -  Injury with Fall? 0 0 - - -  Follow up - Falls evaluation completed Falls evaluation completed - -    FALL RISK PREVENTION PERTAINING TO THE HOME:  Any stairs in or around the home? No  If so, are there any without handrails? No  Home free of loose throw rugs in walkways, pet beds, electrical cords, etc? No  Adequate lighting in your home to reduce risk of falls? Yes   ASSISTIVE DEVICES UTILIZED TO PREVENT FALLS:  Life alert? No  Use of a cane, walker or w/c? No  Grab bars in the bathroom? No  Shower chair or bench in shower? No  Elevated toilet seat or a handicapped toilet? No   TIMED UP AND GO:  Was the test performed? No . Audio Visit   Cognitive Function:     6CIT Screen 11/26/2020 11/26/2019 11/23/2018 08/30/2017  What Year? 0 points 0 points 0 points 0 points  What month? 0 points 0 points 0 points 0 points  What time? 0 points - - 0 points  Count back from 20 0 points 0 points 0 points 0 points  Months in reverse 0 points 0 points 0 points 0 points  Repeat phrase 0 points - 4 points 0 points  Total Score 0 - - 0    Immunizations Immunization History  Administered Date(s) Administered   Fluad  Quad(high Dose 65+) 12/06/2018   Influenza, High Dose Seasonal PF 12/04/2015, 11/23/2016, 11/21/2017, 10/21/2020   Influenza,inj,Quad PF,6+ Mos 02/20/2015  PFIZER(Purple Top)SARS-COV-2 Vaccination 01/31/2019, 02/21/2019, 10/21/2019, 11/04/2020   Pneumococcal Conjugate-13 12/19/2017   Pneumococcal-Unspecified 01/26/2003   Zoster Recombinat (Shingrix) 03/10/2018, 06/23/2018    TDAP status: Due, Education has been provided regarding the importance of this vaccine. Advised may receive this vaccine at local pharmacy or Health Dept. Aware to provide a copy of the vaccination record if obtained from local pharmacy or Health Dept. Verbalized acceptance and understanding.  Flu Vaccine status: Up to date  Pneumococcal vaccine status: Declined,  Education has been provided regarding the importance of this vaccine but patient still declined. Advised may receive this vaccine at local pharmacy or Health Dept. Aware to provide a copy of the vaccination record if obtained from local pharmacy or Health Dept. Verbalized acceptance and understanding.   Covid-19 vaccine status: Completed vaccines   Screening Tests Health Maintenance  Topic Date Due   MAMMOGRAM  11/26/2020   Pneumonia Vaccine 41+ Years old (2 - PPSV23 if available, else PCV20) 11/26/2021 (Originally 12/20/2018)   DEXA SCAN  11/26/2021 (Originally 03/12/1995)   TETANUS/TDAP  06/12/2024 (Originally 03/11/1947)   COVID-19 Vaccine (5 - Booster for [place] series) 12/30/2020   INFLUENZA VACCINE  Completed   Zoster Vaccines- Shingrix  Completed   HPV VACCINES  Aged Out    Health Maintenance  Health Maintenance Due  Topic Date Due   MAMMOGRAM  11/26/2020   Dexa Scan- Patient declined.  Lung Cancer Screening: (Low Dose CT Chest recommended if Age 35-80 years, 30 pack-year currently smoking OR have quit w/in 15years.) does not qualify.   Additional Screening:  Vision Screening: Recommended annual ophthalmology exams for early detection of  glaucoma and other disorders of the eye. Is the patient up to date with their annual eye exam?  Yes  Who is the provider or what is the name of the office in which the patient attends annual eye exams? Followed by [facility].   Dental Screening: Recommended annual dental exams for proper oral hygiene  Community Resource Referral / Chronic Care Management:  CRR required this visit?  No   CCM required this visit?  No     Plan:     I have personally reviewed and noted the following in the patient's chart:   Medical and social history Use of alcohol, tobacco or illicit drugs  Current medications and supplements including opioid prescriptions. Currently not taking opioids. Functional ability and status Nutritional status Physical activity Advanced directives List of other physicians Hospitalizations, surgeries, and ER visits in previous 12 months Vitals Screenings to include cognitive, depression, and falls Referrals and appointments  In addition, I have reviewed and discussed with patient certain preventive protocols, quality metrics, and best practice recommendations. A written personalized care plan for preventive services as well as general preventive health recommendations were provided to patient.     Criselda Peaches, LPN   [phone]

## 2020-11-26 NOTE — Patient Instructions (Addendum)
[name] , Thank you for taking time to come for your Medicare Wellness Visit. I appreciate your ongoing commitment to your health goals. Please review the following plan we discussed and let me know if I can assist you in the future.   These are the goals we discussed:  Goals      Follow up with Primary Care Provider     As needed     Maintain Healthy lifestyle      Patient to maintain healthy lifestyle.        This is a list of the screening recommended for you and due dates:  Health Maintenance  Topic Date Due   Mammogram  11/26/2020   Pneumonia Vaccine (2 - PPSV23 if available, else PCV20) 11/26/2021*   DEXA scan (bone density measurement)  11/26/2021*   Tetanus Vaccine  06/12/2024*   COVID-19 Vaccine (5 - Booster for Pfizer series) 12/30/2020   Flu Shot  Completed   Zoster (Shingles) Vaccine  Completed   HPV Vaccine  Aged Out  *Topic was postponed. The date shown is not the original due date.    Advanced directives: No Patient deferred  Conditions/risks identified:None Next appointment: Follow up in one year for your annual wellness visit Yes   Preventive Care 65 Years and Older, Female Preventive care refers to lifestyle choices and visits with your health care provider that can promote health and wellness. What does preventive care include? A yearly physical exam. This is also called an annual well check. Dental exams once or twice a year. Routine eye exams. Ask your health care provider how often you should have your eyes checked. Personal lifestyle choices, including: Daily care of your teeth and gums. Regular physical activity. Eating a healthy diet. Avoiding tobacco and drug use. Limiting alcohol use. Practicing safe sex. Taking low-dose aspirin every day. Taking vitamin and mineral supplements as recommended by your health care provider. What happens during an annual well check? The services and screenings done by your health care provider during  your annual well check will depend on your age, overall health, lifestyle risk factors, and family history of disease. Counseling  Your health care provider may ask you questions about your: Alcohol use. Tobacco use. Drug use. Emotional well-being. Home and relationship well-being. Sexual activity. Eating habits. History of falls. Memory and ability to understand (cognition). Work and work Statistician. Reproductive health. Screening  You may have the following tests or measurements: Height, weight, and BMI. Blood pressure. Lipid and cholesterol levels. These may be checked every 5 years, or more frequently if you are over 8 years old. Skin check. Lung cancer screening. You may have this screening every year starting at age 65 if you have a 30-pack-year history of smoking and currently smoke or have quit within the past 15 years. Fecal occult blood test (FOBT) of the stool. You may have this test every year starting at age 101. Flexible sigmoidoscopy or colonoscopy. You may have a sigmoidoscopy every 5 years or a colonoscopy every 10 years starting at age 51. Hepatitis C blood test. Hepatitis B blood test. Sexually transmitted disease (STD) testing. Diabetes screening. This is done by checking your blood sugar (glucose) after you have not eaten for a while (fasting). You may have this done every 1-3 years. Bone density scan. This is done to screen for osteoporosis. You may have this done starting at age 36. Mammogram. This may be done every 1-2 years. Talk to your health care provider about how often you  should have regular mammograms. Talk with your health care provider about your test results, treatment options, and if necessary, the need for more tests. Vaccines  Your health care provider may recommend certain vaccines, such as: Influenza vaccine. This is recommended every year. Tetanus, diphtheria, and acellular pertussis (Tdap, Td) vaccine. You may need a Td booster every 10  years. Zoster vaccine. You may need this after age 30. Pneumococcal 13-valent conjugate (PCV13) vaccine. One dose is recommended after age 83. Pneumococcal polysaccharide (PPSV23) vaccine. One dose is recommended after age 20. Talk to your health care provider about which screenings and vaccines you need and how often you need them. This information is not intended to replace advice given to you by your health care provider. Make sure you discuss any questions you have with your health care provider. Document Released: 02/07/2015 Document Revised: 10/01/2015 Document Reviewed: 11/12/2014 Elsevier Interactive Patient Education  2017 [place] Prevention in the Home Falls can cause injuries. They can happen to people of all ages. There are many things you can do to make your home safe and to help prevent falls. What can I do on the outside of my home? Regularly fix the edges of walkways and driveways and fix any cracks. Remove anything that might make you trip as you walk through a door, such as a raised step or threshold. Trim any bushes or trees on the path to your home. Use bright outdoor lighting. Clear any walking paths of anything that might make someone trip, such as rocks or tools. Regularly check to see if handrails are loose or broken. Make sure that both sides of any steps have handrails. Any raised decks and porches should have guardrails on the edges. Have any leaves, snow, or ice cleared regularly. Use sand or salt on walking paths during winter. Clean up any spills in your garage right away. This includes oil or grease spills. What can I do in the bathroom? Use night lights. Install grab bars by the toilet and in the tub and shower. Do not use towel bars as grab bars. Use non-skid mats or decals in the tub or shower. If you need to sit down in the shower, use a plastic, non-slip stool. Keep the floor dry. Clean up any water that spills on the floor as soon as it  happens. Remove soap buildup in the tub or shower regularly. Attach bath mats securely with double-sided non-slip rug tape. Do not have throw rugs and other things on the floor that can make you trip. What can I do in the bedroom? Use night lights. Make sure that you have a light by your bed that is easy to reach. Do not use any sheets or blankets that are too big for your bed. They should not hang down onto the floor. Have a firm chair that has side arms. You can use this for support while you get dressed. Do not have throw rugs and other things on the floor that can make you trip. What can I do in the kitchen? Clean up any spills right away. Avoid walking on wet floors. Keep items that you use a lot in easy-to-reach places. If you need to reach something above you, use a strong step stool that has a grab bar. Keep electrical cords out of the way. Do not use floor polish or wax that makes floors slippery. If you must use wax, use non-skid floor wax. Do not have throw rugs and other things on the  floor that can make you trip. What can I do with my stairs? Do not leave any items on the stairs. Make sure that there are handrails on both sides of the stairs and use them. Fix handrails that are broken or loose. Make sure that handrails are as long as the stairways. Check any carpeting to make sure that it is firmly attached to the stairs. Fix any carpet that is loose or worn. Avoid having throw rugs at the top or bottom of the stairs. If you do have throw rugs, attach them to the floor with carpet tape. Make sure that you have a light switch at the top of the stairs and the bottom of the stairs. If you do not have them, ask someone to add them for you. What else can I do to help prevent falls? Wear shoes that: Do not have high heels. Have rubber bottoms. Are comfortable and fit you well. Are closed at the toe. Do not wear sandals. If you use a stepladder: Make sure that it is fully opened.  Do not climb a closed stepladder. Make sure that both sides of the stepladder are locked into place. Ask someone to hold it for you, if possible. Clearly mark and make sure that you can see: Any grab bars or handrails. First and last steps. Where the edge of each step is. Use tools that help you move around (mobility aids) if they are needed. These include: Canes. Walkers. Scooters. Crutches. Turn on the lights when you go into a dark area. Replace any light bulbs as soon as they burn out. Set up your furniture so you have a clear path. Avoid moving your furniture around. If any of your floors are uneven, fix them. If there are any pets around you, be aware of where they are. Review your medicines with your doctor. Some medicines can make you feel dizzy. This can increase your chance of falling. Ask your doctor what other things that you can do to help prevent falls. This information is not intended to replace advice given to you by your health care provider. Make sure you discuss any questions you have with your health care provider. Document Released: 11/07/2008 Document Revised: 06/19/2015 Document Reviewed: 02/15/2014 Elsevier Interactive Patient Education  2017 [employer].

## 2020-11-27 ENCOUNTER — Ambulatory Visit
Admission: RE | Admit: 2020-11-27 | Discharge: 2020-11-27 | Disposition: A | Discharge status: Home / Self Care | Payer: Medicare HMO | Source: Ambulatory Visit | Attending: Internal Medicine | Admitting: Internal Medicine

## 2020-11-27 ENCOUNTER — Other Ambulatory Visit: Payer: Self-pay

## 2020-11-27 DIAGNOSIS — Z1231 Encounter for screening mammogram for malignant neoplasm of breast: Secondary | ICD-10-CM | POA: Insufficient documentation

## 2020-12-26 ENCOUNTER — Telehealth: Payer: Self-pay | Admitting: Cardiovascular Disease

## 2020-12-26 NOTE — Telephone Encounter (Signed)
Patient dropped PAF put in box

## 2020-12-29 MED ORDER — ENTRESTO 97-103 MG PO TABS: 1.0000 | ORAL_TABLET | Freq: Two times a day (BID) | ORAL | 3 refills | Status: DC

## 2020-12-29 NOTE — Telephone Encounter (Signed)
Received PA application Completed provider's portion, attached copy of insurance card and med list Dr. Rockey Situ has signed Form faxed and placed in file cabinet  Entresto [phone] mg

## 2020-12-30 ENCOUNTER — Telehealth: Payer: Self-pay | Admitting: Internal Medicine

## 2020-12-30 NOTE — Telephone Encounter (Signed)
Pt called in stating that she have a terrible cold. Pt was wondering if Dr. Nicki Reaper can recommend something for Pt to take. Pt requesting callback.

## 2020-12-31 NOTE — Telephone Encounter (Signed)
Symptoms started last week with at night would have sore throat , but during the day felt fine , now has developed into a cough with productive pale yellow mucus . Denies fever , chill,or body aches has been using delsym cough relief.

## 2021-01-01 ENCOUNTER — Encounter: Payer: Self-pay | Admitting: Internal Medicine

## 2021-01-01 ENCOUNTER — Telehealth (INDEPENDENT_AMBULATORY_CARE_PROVIDER_SITE_OTHER): Payer: Medicare HMO | Admitting: Internal Medicine

## 2021-01-01 ENCOUNTER — Other Ambulatory Visit: Payer: Self-pay

## 2021-01-01 DIAGNOSIS — R059 Cough, unspecified: Secondary | ICD-10-CM

## 2021-01-01 DIAGNOSIS — J4520 Mild intermittent asthma, uncomplicated: Secondary | ICD-10-CM

## 2021-01-01 DIAGNOSIS — I10 Essential (primary) hypertension: Secondary | ICD-10-CM | POA: Diagnosis not present

## 2021-01-01 NOTE — Progress Notes (Signed)
[name], female   DOB: 10/09/1928, 87 y.o.   MRN: [id number]   Virtual Visit via telephone Note  This visit type was conducted due to national recommendations for restrictions regarding the COVID-19 pandemic (e.g. social distancing).  This format is felt to be most appropriate for this patient at this time.  All issues noted in this document were discussed and addressed.  No physical exam was performed (except for noted visual exam findings with Video Visits).   I connected with [name] by telephone and verified that I am speaking with the correct person using two identifiers. Location patient: home Location provider: work  Persons participating in the telephone visit: patient, provider  The limitations, risks, security and privacy concerns of performing an evaluation and management service by telephone and the availability of in person appointments have been discussed.  It has also been discussed with the patient that there may be a patient responsible charge related to this service. The patient expressed understanding and agreed to proceed.   Reason for visit:  work in appt  HPI: Work in for persistent cough.  Symptoms started two weeks ago.  Had sweats at night - ? Fever.  Neck was hurting.  Previously sore throat.  Gargled with salt water.  Cough productive - yellow sputum.  Taking delsym and honey/lemon.  No body aches.  No chest congestion.  No sinus pressure.  No sore throat now.  After up and moving - coughing was worse.  No increased sob.  No increased drainage.  Using albuterol in am.  Is better now.  Overall feels better.     ROS: See pertinent positives and negatives per HPI.  Past Medical History:  Diagnosis Date   Asthma    Chronic systolic CHF (congestive heart failure) ([place])    a. 01/2015 Echo: EF 25-30%, sev diff HK, mild to mod MR, mildly dil LA, nl RV fxn, PASP 61 mmHg.   Colon cancer ([place]) 2005   Hypercholesterolemia    Hypertension     Hypertensive heart disease with CHF (congestive heart failure) ([place])    NICM (nonischemic cardiomyopathy) ([place])    a. 01/2015 Echo: EF 25-30% sev diff HK;  b. 01/2015 Lexi MV: EF 19%, small defect of mild severity in apex - likely breast attenuation, no ischemia.   NSVT (nonsustained ventricular tachycardia)    Osteoporosis     Past Surgical History:  Procedure Laterality Date   ABDOMINAL HYSTERECTOMY     BREAST BIOPSY Left 4/15`   BENIGN BREAST EPITHELIUM WITH NODULAR FIBROSIS.   INGUINAL HERNIA REPAIR Left 05/04/2016   Procedure: HERNIA REPAIR INGUINAL ADULT;  Surgeon: Robert Bellow, MD;  Location: [facility] ORS;  Service: General;  Laterality: Left;   THYROIDECTOMY, PARTIAL  1956   TUBAL LIGATION      Family History  Problem Relation Age of Onset   Stroke Mother    Colon cancer Father    Breast cancer Daughter 30   Breast cancer Cousin     SOCIAL HX: reviewed.    Current Outpatient Medications:    acetaminophen (TYLENOL) 325 MG tablet, Take 650 mg by mouth every 6 (six) hours as needed., Disp: , Rfl:    albuterol (VENTOLIN HFA) 108 (90 Base) MCG/ACT inhaler, Inhale 2 puffs into the lungs every 6 (six) hours as needed for wheezing or shortness of breath., Disp: 8.5 g, Rfl: 0   aspirin 81 MG EC tablet, Take 81 mg by mouth daily. Swallow whole., Disp: , Rfl:  Cholecalciferol (VITAMIN D-3) 1000 UNITS CAPS, Take 2,000 Units by mouth daily., Disp: , Rfl:    furosemide (LASIX) 40 MG tablet, Take one tablet  (40MG ) if weight is greater than 130., Disp: , Rfl:    isosorbide mononitrate (IMDUR) 30 MG 24 hr tablet, Take 1 tablet (30 mg total) by mouth daily., Disp: 30 tablet, Rfl: 2   metoprolol succinate (TOPROL-XL) 25 MG 24 hr tablet, Take 1 tablet (25 mg total) by mouth daily., Disp: 30 tablet, Rfl: 4   sacubitril-valsartan (ENTRESTO) 97-103 MG, Take 1 tablet by mouth 2 (two) times daily., Disp: 180 tablet, Rfl: 3   senna-docusate (SENOKOT-S) 8.6-50 MG tablet, Take 2 tablets by mouth 2  (two) times daily as needed for mild constipation., Disp: 60 tablet, Rfl: 0   spironolactone (ALDACTONE) 25 MG tablet, Take 1 tablet (25 mg total) by mouth daily., Disp: 90 tablet, Rfl: 2  EXAM:  GENERAL: alert. Sounds to be in no acute distress.  Answering questions appropriately.    PSYCH/NEURO: pleasant and cooperative, no obvious depression or anxiety, speech and thought processing grossly intact  ASSESSMENT AND PLAN:  Discussed the following assessment and plan:  Problem List Items Addressed This Visit     Asthma    Has been followed by pulmonary.  No increased sob.  Continue delsym.  Albuterol as directed.  Better.  Follow.       Cough    Previus cough and congestion as outlined. Improved.  No drainage or sore throat now.  Delsym as directed.  Albuterol as directed.  Follow.        Essential hypertension    Continue aldactone, entresto, metoprolol and lasix.  Follow pressures.        Return if symptoms worsen or fail to improve.   I discussed the assessment and treatment plan with the patient. The patient was provided an opportunity to ask questions and all were answered. The patient agreed with the plan and demonstrated an understanding of the instructions.   The patient was advised to call back or seek an in-person evaluation if the symptoms worsen or if the condition fails to improve as anticipated.  I provided 23 minutes of non-face-to-face time during this encounter.   Einar Pheasant, MD

## 2021-01-02 NOTE — Telephone Encounter (Signed)
Patient seen in video visit feeling better today.

## 2021-01-04 ENCOUNTER — Encounter: Payer: Self-pay | Admitting: Internal Medicine

## 2021-01-04 DIAGNOSIS — R053 Chronic cough: Secondary | ICD-10-CM | POA: Insufficient documentation

## 2021-01-04 DIAGNOSIS — R059 Cough, unspecified: Secondary | ICD-10-CM | POA: Insufficient documentation

## 2021-01-04 NOTE — Assessment & Plan Note (Signed)
Has been followed by pulmonary.  No increased sob.  Continue delsym.  Albuterol as directed.  Better.  Follow.

## 2021-01-04 NOTE — Assessment & Plan Note (Signed)
Continue aldactone, entresto, metoprolol and lasix.  Follow pressures.  

## 2021-01-04 NOTE — Assessment & Plan Note (Signed)
Previus cough and congestion as outlined. Improved.  No drainage or sore throat now.  Delsym as directed.  Albuterol as directed.  Follow.

## 2021-01-29 ENCOUNTER — Other Ambulatory Visit: Payer: Self-pay | Admitting: Cardiovascular Disease

## 2021-01-30 ENCOUNTER — Telehealth: Payer: Self-pay

## 2021-01-30 NOTE — Telephone Encounter (Signed)
Faxed received  Pt APPROVED for Entresto until 01/24/2022 at no cost to patient  Patient ID [id number]

## 2021-02-09 ENCOUNTER — Telehealth: Payer: Medicare HMO | Admitting: Internal Medicine

## 2021-03-30 ENCOUNTER — Other Ambulatory Visit: Payer: Self-pay | Admitting: Cardiovascular Disease

## 2021-04-02 ENCOUNTER — Other Ambulatory Visit: Payer: Self-pay | Admitting: Internal Medicine

## 2021-04-16 NOTE — Progress Notes (Signed)
? ?Cardiology Office Note   ? ?Date:  04/17/2021  ? ?ID:  Kathy Dominguez, DOB 02-15-1928, MRN [id number] ? ?PCP:  Einar Pheasant, MD  ?Cardiologist:  Ida Rogue, MD  ?Electrophysiologist:  None  ? ?Chief Complaint: Follow-up ? ?History of Present Illness:  ? ?Kathy Dominguez is a 88 y.o. female with history of HFrEF secondary to NICM, hypertensive heart disease, Covid in 01/2020, PAD, aortic atherosclerosis, HLD, and osteoporosis who presents for follow-up of her cardiomyopathy. ?  ?She was admitted to the hospital in 01/2016 with progressive dyspnea and found to have CHF with new LV dysfunction.  Echo showed an EF of 25 to 30%, diffuse hypokinesis, normal LV diastolic function parameters, mild to moderate mitral regurgitation, mildly dilated left atrium, normal RV systolic function, and PASP 61 mmHg.  Cardiac enzymes were negative.  She was diuresed and underwent Lexiscan MPI which showed no significant ischemia.  She was placed on GDMT.  Follow-up echo in 09/2016 showed near normalization of LV systolic function with an EF of 50 to 55%, no regional wall motion abnormalities, mild concentric LVH, grade 1 diastolic dysfunction, and normal PASP.  CT images from 2018 showed PAD, most notably involving the iliac vessels, along with aortic atherosclerosis.  She was last seen in the office in 09/2020 and was doing well from a cardiac perspective.  She did note some shortness of breath that improved with her albuterol inhaler.  She was without symptoms of angina or decompensation.  She was noted to have self discontinued rosuvastatin secondary to lower extremity myalgias with noted improvement in symptoms off medication.  She preferred to defer alternative lipid-lowering therapy at that time. ? ?She comes in accompanied by her son today, who is visiting from [place].  She continues to do well from a cardiac perspective and is without symptoms of angina or decompensation.  Her shortness of breath remains stable  and continues to improve with albuterol inhaler usage.  No palpitations, dizziness, presyncope, syncope, lower extremity swelling, abdominal distention, orthopnea, PND, early satiety.  No falls, hematochezia, or melena.  She is tolerating all cardiac medications without issues.  She does not have any active cardiac concerns at this time.  Her son is pleased with the patient's current status. ? ? ?Labs independently reviewed: ?06/2020 - Hgb 12.7, PLT 190 ?03/2020 - potassium 4.6, BUN 26, serum creatinine 1.2, TC 223, TG 64, HDL 75, LDL 134, albumin 4.5, AST/ALT normal, A1c 6.1 ?11/2019 - TSH normal ? ?Past Medical History:  ?Diagnosis Date  ? Asthma   ? Chronic systolic CHF (congestive heart failure) ([place])   ? a. 01/2015 Echo: EF 25-30%, sev diff HK, mild to mod MR, mildly dil LA, nl RV fxn, PASP 61 mmHg.  ? Colon cancer ([place]) 2005  ? Hypercholesterolemia   ? Hypertension   ? Hypertensive heart disease with CHF (congestive heart failure) ([place])   ? NICM (nonischemic cardiomyopathy) ([place])   ? a. 01/2015 Echo: EF 25-30% sev diff HK;  b. 01/2015 Lexi MV: EF 19%, small defect of mild severity in apex - likely breast attenuation, no ischemia.  ? NSVT (nonsustained ventricular tachycardia)   ? Osteoporosis   ? ? ?Past Surgical History:  ?Procedure Laterality Date  ? ABDOMINAL HYSTERECTOMY    ? BREAST BIOPSY Left 4/15`  ? BENIGN BREAST EPITHELIUM WITH NODULAR FIBROSIS.  ? INGUINAL HERNIA REPAIR Left 05/04/2016  ? Procedure: HERNIA REPAIR INGUINAL ADULT;  Surgeon: Robert Bellow, MD;  Location: [facility] ORS;  Service: General;  Laterality: Left;  ? THYROIDECTOMY, PARTIAL  1956  ? TUBAL LIGATION    ? ? ?Current Medications: ?Current Meds  ?Medication Sig  ? acetaminophen (TYLENOL) 325 MG tablet Take 650 mg by mouth every 6 (six) hours as needed.  ? albuterol (VENTOLIN HFA) 108 (90 Base) MCG/ACT inhaler Inhale 2 puffs into the lungs every 6 (six) hours as needed for wheezing or shortness of breath.  ? aspirin 81 MG EC tablet Take 81  mg by mouth daily. Swallow whole.  ? Cholecalciferol (VITAMIN D-3) 1000 UNITS CAPS Take 2,000 Units by mouth daily.  ? furosemide (LASIX) 40 MG tablet Take one tablet  ('40MG'$ ) if weight is greater than 130.  ? isosorbide mononitrate (IMDUR) 30 MG 24 hr tablet Take 1 tablet (30 mg total) by mouth daily.  ? metoprolol succinate (TOPROL-XL) 25 MG 24 hr tablet Take 1 tablet (25 mg total) by mouth daily.  ? sacubitril-valsartan (ENTRESTO) 97-103 MG Take 1 tablet by mouth 2 (two) times daily.  ? senna-docusate (SENOKOT-S) 8.6-50 MG tablet Take 2 tablets by mouth 2 (two) times daily as needed for mild constipation.  ? spironolactone (ALDACTONE) 25 MG tablet Take 1 tablet (25 mg total) by mouth daily.  ? ? ?Allergies:   Patient has no known allergies.  ? ?Social History  ? ?Socioeconomic History  ? Marital status: Widowed  ?  Spouse name: Not on file  ? Number of children: Not on file  ? Years of education: Not on file  ? Highest education level: Not on file  ?Occupational History  ? Not on file  ?Tobacco Use  ? Smoking status: Never  ? Smokeless tobacco: Never  ?Vaping Use  ? Vaping Use: Never used  ?Substance and Sexual Activity  ? Alcohol use: No  ?  Alcohol/week: 0.0 standard drinks  ? Drug use: No  ? Sexual activity: Not on file  ?Other Topics Concern  ? Not on file  ?Social History Narrative  ? Not on file  ? ?Social Determinants of Health  ? ?Financial Resource Strain: Low Risk   ? Difficulty of Paying Living Expenses: Not hard at all  ?Food Insecurity: No Food Insecurity  ? Worried About Charity fundraiser in the Last Year: Never true  ? Ran Out of Food in the Last Year: Never true  ?Transportation Needs: No Transportation Needs  ? Lack of Transportation (Medical): No  ? Lack of Transportation (Non-Medical): No  ?Physical Activity: Insufficiently Active  ? Days of Exercise per Week: 1 day  ? Minutes of Exercise per Session: 10 min  ?Stress: No Stress Concern Present  ? Feeling of Stress : Not at all  ?Social  Connections: Unknown  ? Frequency of Communication with Friends and Family: More than three times a week  ? Frequency of Social Gatherings with Friends and Family: Twice a week  ? Attends Religious Services: More than 4 times per year  ? Active Member of Clubs or Organizations: No  ? Attends Archivist Meetings: Never  ? Marital Status: Not on file  ?  ? ?Family History:  ?The patient's family history includes Breast cancer in her cousin; Breast cancer (age of onset: 36) in her daughter; Colon cancer in her father; Stroke in her mother. ? ?ROS:   ?Review of Systems  ?Constitutional:  Negative for chills, diaphoresis, fever, malaise/fatigue and weight loss.  ?HENT:  Negative for congestion.   ?Eyes:  Negative for discharge and redness.  ?Respiratory:  Positive for shortness of  breath. Negative for cough, sputum production and wheezing.   ?     Intermittent mild shortness of breath that improved with albuterol usage  ?Cardiovascular:  Negative for chest pain, palpitations, orthopnea, claudication, leg swelling and PND.  ?Gastrointestinal:  Negative for abdominal pain, heartburn, nausea and vomiting.  ?Musculoskeletal:  Negative for falls, joint pain and myalgias.  ?Skin:  Negative for rash.  ?Neurological:  Negative for dizziness, tingling, tremors, sensory change, speech change, focal weakness, loss of consciousness and weakness.  ?Endo/Heme/Allergies:  Does not bruise/bleed easily.  ?Psychiatric/Behavioral:  Negative for substance abuse. The patient is not nervous/anxious.   ?All other systems reviewed and are negative. ? ? ?EKGs/Labs/Other Studies Reviewed:   ? ?Studies reviewed were summarized above. The additional studies were reviewed today: ? ?2D echo 09/2016: ?- Left ventricle: The cavity size was normal. There was mild  ?  concentric hypertrophy. Systolic function was normal. The  ?  estimated ejection fraction was in the range of 50% to 55%. Wall  ?  motion was normal; there were no regional wall  motion  ?  abnormalities. Doppler parameters are consistent with abnormal  ?  left ventricular relaxation (grade 1 diastolic dysfunction).  ?- Pulmonary arteries: Systolic pressure was within the normal  ?  ran

## 2021-04-17 ENCOUNTER — Other Ambulatory Visit: Payer: Self-pay

## 2021-04-17 ENCOUNTER — Encounter: Payer: Self-pay | Admitting: Physician Assistant

## 2021-04-17 ENCOUNTER — Ambulatory Visit: Payer: Medicare HMO | Admitting: Physician Assistant

## 2021-04-17 VITALS — BP 148/80 | HR 72 | Ht 62.0 in | Wt 133.0 lb

## 2021-04-17 DIAGNOSIS — I5022 Chronic systolic (congestive) heart failure: Secondary | ICD-10-CM | POA: Diagnosis not present

## 2021-04-17 DIAGNOSIS — I739 Peripheral vascular disease, unspecified: Secondary | ICD-10-CM

## 2021-04-17 DIAGNOSIS — I428 Other cardiomyopathies: Secondary | ICD-10-CM | POA: Diagnosis not present

## 2021-04-17 DIAGNOSIS — I10 Essential (primary) hypertension: Secondary | ICD-10-CM | POA: Diagnosis not present

## 2021-04-17 DIAGNOSIS — E785 Hyperlipidemia, unspecified: Secondary | ICD-10-CM

## 2021-04-17 DIAGNOSIS — I700 Atherosclerosis of aorta: Secondary | ICD-10-CM

## 2021-04-17 NOTE — Patient Instructions (Signed)
Medication Instructions:  ?No changes at this time.  ? ?*If you need a refill on your cardiac medications before your next appointment, please call your pharmacy* ? ? ?Lab Work: ?BMET today ? ?If you have labs (blood work) drawn today and your tests are completely normal, you will receive your results only by: ?MyChart Message (if you have MyChart) OR ?A paper copy in the mail ?If you have any lab test that is abnormal or we need to change your treatment, we will call you to review the results. ? ? ?Testing/Procedures: ?None ? ? ?Follow-Up: ?At [facility], you and your health needs are our priority.  As part of our continuing mission to provide you with exceptional heart care, we have created designated Provider Care Teams.  These Care Teams include your primary Cardiologist (physician) and Advanced Practice Providers (APPs -  Physician Assistants and Nurse Practitioners) who all work together to provide you with the care you need, when you need it. ? ? ?Your next appointment:   ?6 month(s) ? ?The format for your next appointment:   ?In Person ? ?Provider:   ?Ida Rogue, MD or Christell Faith, PA-C ?

## 2021-04-18 LAB — BASIC METABOLIC PANEL
BUN/Creatinine Ratio: 23 (ref 12–28)
BUN: 27 mg/dL (ref 10–36)
CO2: 27 mmol/L (ref 20–29)
Calcium: 9.2 mg/dL (ref 8.7–10.3)
Chloride: 103 mmol/L (ref 96–106)
Creatinine, Ser: 1.18 mg/dL — ABNORMAL HIGH (ref 0.57–1.00)
Glucose: 147 mg/dL — ABNORMAL HIGH (ref 70–99)
Potassium: 4.6 mmol/L (ref 3.5–5.2)
Sodium: 144 mmol/L (ref 134–144)
eGFR: 44 mL/min/{1.73_m2} — ABNORMAL LOW (ref >59)

## 2021-04-20 ENCOUNTER — Ambulatory Visit (INDEPENDENT_AMBULATORY_CARE_PROVIDER_SITE_OTHER): Payer: Medicare HMO | Admitting: Internal Medicine

## 2021-04-20 ENCOUNTER — Ambulatory Visit (INDEPENDENT_AMBULATORY_CARE_PROVIDER_SITE_OTHER): Payer: Medicare HMO

## 2021-04-20 ENCOUNTER — Encounter: Payer: Self-pay | Admitting: Internal Medicine

## 2021-04-20 ENCOUNTER — Other Ambulatory Visit: Payer: Self-pay

## 2021-04-20 VITALS — BP 120/78 | HR 68 | Temp 98.5°F | Resp 16 | Ht 62.0 in | Wt 130.8 lb

## 2021-04-20 DIAGNOSIS — E7800 Pure hypercholesterolemia, unspecified: Secondary | ICD-10-CM

## 2021-04-20 DIAGNOSIS — R053 Chronic cough: Secondary | ICD-10-CM | POA: Diagnosis not present

## 2021-04-20 DIAGNOSIS — J4520 Mild intermittent asthma, uncomplicated: Secondary | ICD-10-CM

## 2021-04-20 DIAGNOSIS — R7989 Other specified abnormal findings of blood chemistry: Secondary | ICD-10-CM | POA: Diagnosis not present

## 2021-04-20 DIAGNOSIS — N1832 Chronic kidney disease, stage 3b: Secondary | ICD-10-CM

## 2021-04-20 DIAGNOSIS — I10 Essential (primary) hypertension: Secondary | ICD-10-CM

## 2021-04-20 DIAGNOSIS — M25561 Pain in right knee: Secondary | ICD-10-CM

## 2021-04-20 DIAGNOSIS — R739 Hyperglycemia, unspecified: Secondary | ICD-10-CM

## 2021-04-20 DIAGNOSIS — I110 Hypertensive heart disease with heart failure: Secondary | ICD-10-CM

## 2021-04-20 DIAGNOSIS — Z85038 Personal history of other malignant neoplasm of large intestine: Secondary | ICD-10-CM

## 2021-04-20 DIAGNOSIS — I4729 Other ventricular tachycardia: Secondary | ICD-10-CM

## 2021-04-20 DIAGNOSIS — D72819 Decreased white blood cell count, unspecified: Secondary | ICD-10-CM

## 2021-04-20 DIAGNOSIS — I5022 Chronic systolic (congestive) heart failure: Secondary | ICD-10-CM

## 2021-04-20 LAB — CBC WITH DIFFERENTIAL/PLATELET
Basophils Absolute: 0 10*3/uL (ref 0.0–0.1)
Basophils Relative: 0.5 % (ref 0.0–3.0)
Eosinophils Absolute: 0 10*3/uL (ref 0.0–0.7)
Eosinophils Relative: 0.2 % (ref 0.0–5.0)
HCT: 38.2 % (ref 36.0–46.0)
Hemoglobin: 12.6 g/dL (ref 12.0–15.0)
Lymphocytes Relative: 33.3 % (ref 12.0–46.0)
Lymphs Abs: 1 10*3/uL (ref 0.7–4.0)
MCHC: 33 g/dL (ref 30.0–36.0)
MCV: 92 fl (ref 78.0–100.0)
Monocytes Absolute: 0.3 10*3/uL (ref 0.1–1.0)
Monocytes Relative: 10.4 % (ref 3.0–12.0)
Neutro Abs: 1.7 10*3/uL (ref 1.4–7.7)
Neutrophils Relative %: 55.6 % (ref 43.0–77.0)
Platelets: 197 10*3/uL (ref 150.0–400.0)
RBC: 4.15 Mil/uL (ref 3.87–5.11)
RDW: 13.5 % (ref 11.5–15.5)
WBC: 3.1 10*3/uL — ABNORMAL LOW (ref 4.0–10.5)

## 2021-04-20 LAB — LIPID PANEL
Cholesterol: 201 mg/dL — ABNORMAL HIGH (ref 0–200)
HDL: 81.7 mg/dL (ref >39.00)
LDL Cholesterol: 110 mg/dL — ABNORMAL HIGH (ref 0–99)
NonHDL: 119.24
Total CHOL/HDL Ratio: 2
Triglycerides: 48 mg/dL (ref 0.0–149.0)
VLDL: 9.6 mg/dL (ref 0.0–40.0)

## 2021-04-20 LAB — HEPATIC FUNCTION PANEL
ALT: 8 U/L (ref 0–35)
AST: 16 U/L (ref 0–37)
Albumin: 4.3 g/dL (ref 3.5–5.2)
Alkaline Phosphatase: 61 U/L (ref 39–117)
Bilirubin, Direct: 0.1 mg/dL (ref 0.0–0.3)
Total Bilirubin: 0.4 mg/dL (ref 0.2–1.2)
Total Protein: 6.8 g/dL (ref 6.0–8.3)

## 2021-04-20 LAB — HEMOGLOBIN A1C: Hgb A1c MFr Bld: 6.2 % (ref 4.6–6.5)

## 2021-04-20 NOTE — Progress Notes (Signed)
[name], female   DOB: 04-10-1928, 88 y.o.   MRN: [id number] ? ? ?Subjective:  ? ? [name], female    DOB: [birth year], 88 y.o.   MRN: [id number] ? ?This visit occurred during the SARS-CoV-2 public health emergency.  Safety protocols were in place, including screening questions prior to the visit, additional usage of staff PPE, and extensive cleaning of exam room while observing appropriate contact time as indicated for disinfecting solutions.  ? ?Patient here for her physical exam.  ? ?Chief Complaint  ?Patient presents with  ? Annual Exam  ?  Pt c/o constant dry cough  that comes and goes, feeling good overall. Refill on Metoprolol   ? .  ? ?HPI ?Here for physical.  Appt changed to f/u appt. Saw cardiology 04/17/21 - SOB stable and improves with albuterol usage.  Continues on entresto, toprol and spironolactone. Off crestor - due to lower extremity myalgias.  Will remain off.  No chest pain.  Breathing overall stable.  She does report persistent intermittent cough.  No chest congestion.  No sinus pressure or drainage.  Some sob with increased exertion, but improves with albuterol and is stable.  No acid reflux.  No abdominal pain or bowel change.  Some pain - right knee.  Once up and moving - better.  Worse when gets up -after sitting for long periods.   ? ? ?Past Medical History:  ?Diagnosis Date  ? Asthma   ? Chronic systolic CHF (congestive heart failure) ([place])   ? a. 01/2015 Echo: EF 25-30%, sev diff HK, mild to mod MR, mildly dil LA, nl RV fxn, PASP 61 mmHg.  ? Colon cancer ([place]) 2005  ? Hypercholesterolemia   ? Hypertension   ? Hypertensive heart disease with CHF (congestive heart failure) ([place])   ? NICM (nonischemic cardiomyopathy) ([place])   ? a. 01/2015 Echo: EF 25-30% sev diff HK;  b. 01/2015 Lexi MV: EF 19%, small defect of mild severity in apex - likely breast attenuation, no ischemia.  ? NSVT (nonsustained ventricular tachycardia)   ? Osteoporosis   ? ?Past Surgical  History:  ?Procedure Laterality Date  ? ABDOMINAL HYSTERECTOMY    ? BREAST BIOPSY Left 4/15`  ? BENIGN BREAST EPITHELIUM WITH NODULAR FIBROSIS.  ? INGUINAL HERNIA REPAIR Left 05/04/2016  ? Procedure: HERNIA REPAIR INGUINAL ADULT;  Surgeon: Robert Bellow, MD;  Location: [facility] ORS;  Service: General;  Laterality: Left;  ? THYROIDECTOMY, PARTIAL  1956  ? TUBAL LIGATION    ? ?Family History  ?Problem Relation Age of Onset  ? Stroke Mother   ? Colon cancer Father   ? Breast cancer Daughter 75  ? Breast cancer Cousin   ? ?Social History  ? ?Socioeconomic History  ? Marital status: Widowed  ?  Spouse name: Not on file  ? Number of children: Not on file  ? Years of education: Not on file  ? Highest education level: Not on file  ?Occupational History  ? Not on file  ?Tobacco Use  ? Smoking status: Never  ? Smokeless tobacco: Never  ?Vaping Use  ? Vaping Use: Never used  ?Substance and Sexual Activity  ? Alcohol use: No  ?  Alcohol/week: 0.0 standard drinks  ? Drug use: No  ? Sexual activity: Not on file  ?Other Topics Concern  ? Not on file  ?Social History Narrative  ? Not on file  ? ?Social Determinants of Health  ? ?Financial Resource Strain: Low  Risk   ? Difficulty of Paying Living Expenses: Not hard at all  ?Food Insecurity: No Food Insecurity  ? Worried About Charity fundraiser in the Last Year: Never true  ? Ran Out of Food in the Last Year: Never true  ?Transportation Needs: No Transportation Needs  ? Lack of Transportation (Medical): No  ? Lack of Transportation (Non-Medical): No  ?Physical Activity: Insufficiently Active  ? Days of Exercise per Week: 1 day  ? Minutes of Exercise per Session: 10 min  ?Stress: No Stress Concern Present  ? Feeling of Stress : Not at all  ?Social Connections: Unknown  ? Frequency of Communication with Friends and Family: More than three times a week  ? Frequency of Social Gatherings with Friends and Family: Twice a week  ? Attends Religious Services: More than 4 times per year  ?  Active Member of Clubs or Organizations: No  ? Attends Archivist Meetings: Never  ? Marital Status: Not on file  ? ? ? ?Review of Systems  ?Constitutional:  Negative for appetite change and unexpected weight change.  ?HENT:  Negative for congestion and sinus pressure.   ?Respiratory:  Negative for cough and chest tightness.   ?     Some sob as outlined.  Overall she feels is stable.   ?Cardiovascular:  Negative for chest pain, palpitations and leg swelling.  ?Gastrointestinal:  Negative for abdominal pain, diarrhea, nausea and vomiting.  ?Genitourinary:  Negative for difficulty urinating and dysuria.  ?Musculoskeletal:  Negative for joint swelling and myalgias.  ?     Knee pain as outlined.   ?Skin:  Negative for color change and rash.  ?Neurological:  Negative for dizziness, light-headedness and headaches.  ?Psychiatric/Behavioral:  Negative for agitation and dysphoric mood.   ? ?   ?Objective:  ?  ? ?BP 120/78 (BP Location: Left Arm, Patient Position: Sitting, Cuff Size: Normal)   Pulse 68   Temp 98.5 ?F (36.9 ?C) (Oral)   Resp 16   Ht '5\' 2"'$  (1.575 m)   Wt 130 lb 12.8 oz (59.3 kg)   LMP 02/21/1966   SpO2 94%   BMI 23.92 kg/m?  ?Wt Readings from Last 3 Encounters:  ?04/20/21 130 lb 12.8 oz (59.3 kg)  ?04/17/21 133 lb (60.3 kg)  ?01/01/21 128 lb (58.1 kg)  ? ? ?Physical Exam ?Vitals reviewed.  ?Constitutional:   ?   General: She is not in acute distress. ?   Appearance: Normal appearance.  ?HENT:  ?   Head: Normocephalic and atraumatic.  ?   Right Ear: External ear normal.  ?   Left Ear: External ear normal.  ?Eyes:  ?   General: No scleral icterus.    ?   Right eye: No discharge.     ?   Left eye: No discharge.  ?   Conjunctiva/sclera: Conjunctivae normal.  ?Neck:  ?   Thyroid: No thyromegaly.  ?Cardiovascular:  ?   Rate and Rhythm: Normal rate and regular rhythm.  ?Pulmonary:  ?   Effort: No respiratory distress.  ?   Breath sounds: Normal breath sounds. No wheezing.  ?Abdominal:  ?   General:  Bowel sounds are normal.  ?   Palpations: Abdomen is soft.  ?   Tenderness: There is no abdominal tenderness.  ?Musculoskeletal:     ?   General: No swelling or tenderness.  ?   Cervical back: Neck supple. No tenderness.  ?Lymphadenopathy:  ?   Cervical: No cervical adenopathy.  ?  Skin: ?   Findings: No erythema or rash.  ?Neurological:  ?   Mental Status: She is alert.  ?Psychiatric:     ?   Mood and Affect: Mood normal.     ?   Behavior: Behavior normal.  ? ? ? ?Outpatient Encounter Medications as of 04/20/2021  ?Medication Sig  ? acetaminophen (TYLENOL) 325 MG tablet Take 650 mg by mouth every 6 (six) hours as needed.  ? albuterol (VENTOLIN HFA) 108 (90 Base) MCG/ACT inhaler Inhale 2 puffs into the lungs every 6 (six) hours as needed for wheezing or shortness of breath.  ? aspirin 81 MG EC tablet Take 81 mg by mouth daily. Swallow whole.  ? Cholecalciferol (VITAMIN D-3) 1000 UNITS CAPS Take 2,000 Units by mouth daily.  ? furosemide (LASIX) 40 MG tablet Take one tablet  ('40MG'$ ) if weight is greater than 130.  ? isosorbide mononitrate (IMDUR) 30 MG 24 hr tablet Take 1 tablet (30 mg total) by mouth daily.  ? metoprolol succinate (TOPROL-XL) 25 MG 24 hr tablet Take 1 tablet (25 mg total) by mouth daily.  ? sacubitril-valsartan (ENTRESTO) 97-103 MG Take 1 tablet by mouth 2 (two) times daily.  ? senna-docusate (SENOKOT-S) 8.6-50 MG tablet Take 2 tablets by mouth 2 (two) times daily as needed for mild constipation.  ? spironolactone (ALDACTONE) 25 MG tablet Take 1 tablet (25 mg total) by mouth daily.  ? ?No facility-administered encounter medications on file as of 04/20/2021.  ?  ? ?Lab Results  ?Component Value Date  ? WBC 3.7 (L) 06/26/2020  ? HGB 12.7 06/26/2020  ? HCT 37.7 06/26/2020  ? PLT 190.0 06/26/2020  ? GLUCOSE 147 (H) 04/17/2021  ? CHOL 223 (H) 04/16/2020  ? TRIG 64.0 04/16/2020  ? HDL 75.80 04/16/2020  ? LDLDIRECT 128.3 02/08/2013  ? LDLCALC 134 (H) 04/16/2020  ? ALT 7 04/16/2020  ? AST 14 04/16/2020  ? NA 144  04/17/2021  ? K 4.6 04/17/2021  ? CL 103 04/17/2021  ? CREATININE 1.18 (H) 04/17/2021  ? BUN 27 04/17/2021  ? CO2 27 04/17/2021  ? TSH 0.98 12/12/2019  ? INR 0.9 06/28/2019  ? HGBA1C 6.1 04/16/2020  ? ? ?MM 3D

## 2021-04-21 ENCOUNTER — Encounter: Payer: Self-pay | Admitting: Internal Medicine

## 2021-04-21 ENCOUNTER — Other Ambulatory Visit: Payer: Self-pay | Admitting: Cardiovascular Disease

## 2021-04-21 LAB — TSH: TSH: 0.78 u[IU]/mL (ref 0.35–5.50)

## 2021-04-21 NOTE — Assessment & Plan Note (Signed)
Follow met b and a1c.  ?

## 2021-04-21 NOTE — Assessment & Plan Note (Signed)
Avoid antiinflammatories.  Follow metabolic panel.  

## 2021-04-21 NOTE — Assessment & Plan Note (Signed)
Cough as outlined.  Some sob reported.  Breathing overall stable.  Has albuterol if needed.  Check cxr.  ?

## 2021-04-21 NOTE — Assessment & Plan Note (Signed)
Continue aldactone, entresto, metoprolol and lasix.  Follow pressures.  

## 2021-04-21 NOTE — Assessment & Plan Note (Signed)
Was on crestor. Intolerance.  Off now.  Follow lipid panel.  

## 2021-04-21 NOTE — Assessment & Plan Note (Signed)
Notices when stands after sitting for long period of time.  Better once moving.  Does not feel needs any further intervention.  Follow.  ?

## 2021-04-21 NOTE — Assessment & Plan Note (Signed)
No evidence of volume overload today.  Continue entresto, aldactone, lasix and metoprolol.  Stable.  ?

## 2021-04-21 NOTE — Assessment & Plan Note (Signed)
Recently evaluated by oncology for increase CEA.  PET ok.   ?

## 2021-04-21 NOTE — Assessment & Plan Note (Signed)
Previously evaluated by pulmonary.  Some sob as outlined.  Improved with albuterol.  Overall feels breathing is stable.  Some persistent intermittent cough.  Check cxr.   ?

## 2021-04-21 NOTE — Assessment & Plan Note (Signed)
Documented per cardiology. On metoprolol. Stable.  

## 2021-04-21 NOTE — Assessment & Plan Note (Signed)
Follow cbc.  

## 2021-04-21 NOTE — Assessment & Plan Note (Signed)
Taking metoprolol, spironolactone, entrestro and imdur.  Has lasix to take if needed.  Has not required.  Weight stable.  Follow.  ?

## 2021-04-22 ENCOUNTER — Encounter: Payer: Self-pay | Admitting: Internal Medicine

## 2021-04-22 DIAGNOSIS — I700 Atherosclerosis of aorta: Secondary | ICD-10-CM | POA: Insufficient documentation

## 2021-04-24 ENCOUNTER — Telehealth: Payer: Self-pay | Admitting: Internal Medicine

## 2021-04-24 ENCOUNTER — Other Ambulatory Visit: Payer: Self-pay

## 2021-04-24 DIAGNOSIS — E7800 Pure hypercholesterolemia, unspecified: Secondary | ICD-10-CM

## 2021-04-24 DIAGNOSIS — R053 Chronic cough: Secondary | ICD-10-CM

## 2021-04-24 DIAGNOSIS — I10 Essential (primary) hypertension: Secondary | ICD-10-CM

## 2021-04-24 NOTE — Telephone Encounter (Signed)
Pt returning phone call ° °

## 2021-05-04 ENCOUNTER — Other Ambulatory Visit: Payer: Self-pay | Admitting: Cardiovascular Disease

## 2021-06-24 ENCOUNTER — Ambulatory Visit (INDEPENDENT_AMBULATORY_CARE_PROVIDER_SITE_OTHER): Payer: Medicare HMO | Admitting: Internal Medicine

## 2021-06-24 ENCOUNTER — Encounter: Payer: Self-pay | Admitting: Internal Medicine

## 2021-06-24 DIAGNOSIS — I5022 Chronic systolic (congestive) heart failure: Secondary | ICD-10-CM | POA: Diagnosis not present

## 2021-06-24 DIAGNOSIS — I10 Essential (primary) hypertension: Secondary | ICD-10-CM

## 2021-06-24 DIAGNOSIS — I700 Atherosclerosis of aorta: Secondary | ICD-10-CM

## 2021-06-24 DIAGNOSIS — I428 Other cardiomyopathies: Secondary | ICD-10-CM | POA: Diagnosis not present

## 2021-06-24 DIAGNOSIS — J4520 Mild intermittent asthma, uncomplicated: Secondary | ICD-10-CM

## 2021-06-24 DIAGNOSIS — Z85038 Personal history of other malignant neoplasm of large intestine: Secondary | ICD-10-CM

## 2021-06-24 DIAGNOSIS — D72819 Decreased white blood cell count, unspecified: Secondary | ICD-10-CM

## 2021-06-24 DIAGNOSIS — R739 Hyperglycemia, unspecified: Secondary | ICD-10-CM

## 2021-06-24 DIAGNOSIS — E7800 Pure hypercholesterolemia, unspecified: Secondary | ICD-10-CM

## 2021-06-24 DIAGNOSIS — R7989 Other specified abnormal findings of blood chemistry: Secondary | ICD-10-CM

## 2021-06-24 DIAGNOSIS — S92909D Unspecified fracture of unspecified foot, subsequent encounter for fracture with routine healing: Secondary | ICD-10-CM

## 2021-06-24 DIAGNOSIS — N1832 Chronic kidney disease, stage 3b: Secondary | ICD-10-CM

## 2021-06-24 NOTE — Progress Notes (Signed)
[name], female   DOB: 1928/04/01, 88 y.o.   MRN: [id number]   Subjective:    [name], female    DOB: 24-Mar-1928, 88 y.o.   MRN: [id number]   Patient here for a scheduled follow up.     HPI Here to follow up regarding blood pressure and cholesterol.  Was evaluated 06/11/21 - acute care - foot injury. Found to have closed nondisplaced fracture of fifth metatarsal bone of right foot with routine healing, subsequent encounter and closed displaced fracture of body of right calcaneus.  In a boot.  Due to see podiatry tomorrow.  States otherwise doing well.  No chest pain.  Breathing stable.  No increased cough or congestion.  No abdominal pain.  Bowels moving.  Eating well.    Past Medical History:  Diagnosis Date   Asthma    Chronic systolic CHF (congestive heart failure) ([place])    a. 01/2015 Echo: EF 25-30%, sev diff HK, mild to mod MR, mildly dil LA, nl RV fxn, PASP 61 mmHg.   Colon cancer ([place]) 2005   Hypercholesterolemia    Hypertension    Hypertensive heart disease with CHF (congestive heart failure) ([place])    NICM (nonischemic cardiomyopathy) ([place])    a. 01/2015 Echo: EF 25-30% sev diff HK;  b. 01/2015 Lexi MV: EF 19%, small defect of mild severity in apex - likely breast attenuation, no ischemia.   NSVT (nonsustained ventricular tachycardia) (HCC)    Osteoporosis    Past Surgical History:  Procedure Laterality Date   ABDOMINAL HYSTERECTOMY     BREAST BIOPSY Left 4/15`   BENIGN BREAST EPITHELIUM WITH NODULAR FIBROSIS.   INGUINAL HERNIA REPAIR Left 05/04/2016   Procedure: HERNIA REPAIR INGUINAL ADULT;  Surgeon: Robert Bellow, MD;  Location: [facility] ORS;  Service: General;  Laterality: Left;   THYROIDECTOMY, PARTIAL  1956   TUBAL LIGATION     Family History  Problem Relation Age of Onset   Stroke Mother    Colon cancer Father    Breast cancer Daughter 82   Breast cancer Cousin    Social History   Socioeconomic History   Marital  status: Widowed    Spouse name: Not on file   Number of children: Not on file   Years of education: Not on file   Highest education level: Not on file  Occupational History   Not on file  Tobacco Use   Smoking status: Never   Smokeless tobacco: Never  Vaping Use   Vaping Use: Never used  Substance and Sexual Activity   Alcohol use: No    Alcohol/week: 0.0 standard drinks   Drug use: No   Sexual activity: Not on file  Other Topics Concern   Not on file  Social History Narrative   Not on file   Social Determinants of Health   Financial Resource Strain: Low Risk    Difficulty of Paying Living Expenses: Not hard at all  Food Insecurity: No Food Insecurity   Worried About Charity fundraiser in the Last Year: Never true   [place] in the Last Year: Never true  Transportation Needs: No Transportation Needs   Lack of Transportation (Medical): No   Lack of Transportation (Non-Medical): No  Physical Activity: Insufficiently Active   Days of Exercise per Week: 1 day   Minutes of Exercise per Session: 10 min  Stress: No Stress Concern Present   Feeling of Stress : Not at all  Social Connections: Unknown   Frequency of Communication with Friends and Family: More than three times a week   Frequency of Social Gatherings with Friends and Family: Twice a week   Attends Religious Services: More than 4 times per year   Active Member of [employer] or Organizations: No   Attends Archivist Meetings: Never   Marital Status: Not on file     Review of Systems  Constitutional:  Negative for appetite change and unexpected weight change.  HENT:  Negative for congestion and sinus pressure.   Respiratory:  Negative for cough and chest tightness.        Breathing stable.   Cardiovascular:  Negative for chest pain and palpitations.  Gastrointestinal:  Negative for abdominal pain, diarrhea, nausea and vomiting.  Genitourinary:  Negative for difficulty urinating and dysuria.   Musculoskeletal:  Negative for myalgias.       Foot injury as outlined.   Skin:  Negative for color change and rash.  Neurological:  Negative for dizziness, light-headedness and headaches.  Psychiatric/Behavioral:  Negative for agitation and dysphoric mood.       Objective:     BP 120/68 (BP Location: Left Arm, Patient Position: Sitting, Cuff Size: Small)   Pulse 68   Temp 98 F (36.7 C) (Temporal)   Resp 17   Ht 5' 2"  (1.575 m)   Wt 135 lb 9.6 oz (61.5 kg)   LMP 02/21/1966   SpO2 97%   BMI 24.80 kg/m  Wt Readings from Last 3 Encounters:  06/24/21 135 lb 9.6 oz (61.5 kg)  04/20/21 130 lb 12.8 oz (59.3 kg)  04/17/21 133 lb (60.3 kg)    Physical Exam Vitals reviewed.  Constitutional:      General: She is not in acute distress.    Appearance: Normal appearance.  HENT:     Head: Normocephalic and atraumatic.     Right Ear: External ear normal.     Left Ear: External ear normal.  Eyes:     General: No scleral icterus.       Right eye: No discharge.        Left eye: No discharge.     Conjunctiva/sclera: Conjunctivae normal.  Neck:     Thyroid: No thyromegaly.  Cardiovascular:     Rate and Rhythm: Normal rate and regular rhythm.  Pulmonary:     Effort: No respiratory distress.     Breath sounds: Normal breath sounds. No wheezing.  Abdominal:     General: Bowel sounds are normal.     Palpations: Abdomen is soft.     Tenderness: There is no abdominal tenderness.  Musculoskeletal:        General: No tenderness.     Cervical back: Neck supple. No tenderness.     Comments: Swelling foot/ankle - no increased erythema.   Lymphadenopathy:     Cervical: No cervical adenopathy.  Skin:    Findings: No erythema or rash.  Neurological:     Mental Status: She is alert.  Psychiatric:        Mood and Affect: Mood normal.        Behavior: Behavior normal.     Outpatient Encounter Medications as of 06/24/2021  Medication Sig   acetaminophen (TYLENOL) 325 MG tablet Take  650 mg by mouth every 6 (six) hours as needed.   albuterol (VENTOLIN HFA) 108 (90 Base) MCG/ACT inhaler Inhale 2 puffs into the lungs every 6 (six) hours as needed for wheezing or shortness of breath.  aspirin 81 MG EC tablet Take 81 mg by mouth daily. Swallow whole.   Cholecalciferol (VITAMIN D-3) 1000 UNITS CAPS Take 2,000 Units by mouth daily.   furosemide (LASIX) 40 MG tablet Take one tablet  (40MG) if weight is greater than 130.   isosorbide mononitrate (IMDUR) 30 MG 24 hr tablet Take 1 tablet (30 mg total) by mouth daily.   metoprolol succinate (TOPROL-XL) 25 MG 24 hr tablet Take 1 tablet (25 mg total) by mouth daily.   sacubitril-valsartan (ENTRESTO) 97-103 MG Take 1 tablet by mouth 2 (two) times daily.   senna-docusate (SENOKOT-S) 8.6-50 MG tablet Take 2 tablets by mouth 2 (two) times daily as needed for mild constipation.   spironolactone (ALDACTONE) 25 MG tablet Take 1 tablet (25 mg total) by mouth daily.   No facility-administered encounter medications on file as of 06/24/2021.     Lab Results  Component Value Date   WBC 3.1 (L) 04/20/2021   HGB 12.6 04/20/2021   HCT 38.2 04/20/2021   PLT 197.0 04/20/2021   GLUCOSE 147 (H) 04/17/2021   CHOL 201 (H) 04/20/2021   TRIG 48.0 04/20/2021   HDL 81.70 04/20/2021   LDLDIRECT 128.3 02/08/2013   LDLCALC 110 (H) 04/20/2021   ALT 8 04/20/2021   AST 16 04/20/2021   NA 144 04/17/2021   K 4.6 04/17/2021   CL 103 04/17/2021   CREATININE 1.18 (H) 04/17/2021   BUN 27 04/17/2021   CO2 27 04/17/2021   TSH 0.78 04/20/2021   INR 0.9 06/28/2019   HGBA1C 6.2 04/20/2021    MM 3D SCREEN BREAST BILATERAL  Result Date: 11/27/2020 CLINICAL DATA:  Screening. EXAM: DIGITAL SCREENING BILATERAL MAMMOGRAM WITH TOMOSYNTHESIS AND CAD TECHNIQUE: Bilateral screening digital craniocaudal and mediolateral oblique mammograms were obtained. Bilateral screening digital breast tomosynthesis was performed. The images were evaluated with computer-aided  detection. COMPARISON:  Previous exam(s). ACR Breast Density Category c: The breast tissue is heterogeneously dense, which may obscure small masses. FINDINGS: There are no findings suspicious for malignancy. IMPRESSION: No mammographic evidence of malignancy. A result letter of this screening mammogram will be mailed directly to the patient. RECOMMENDATION: Screening mammogram in one year. (Code:SM-B-01Y) BI-RADS CATEGORY  1: Negative. Electronically Signed   By: Ammie Ferrier M.D.   On: 11/27/2020 13:22      Assessment & Plan:   Problem List Items Addressed This Visit     Abnormal liver function tests    Follow liver function tests.        Relevant Orders   Hepatic function panel   Aortic atherosclerosis (HCC)    Intolerance to crestor.  Off now.  Continue blood pressure control.        Asthma    Previously evaluated by pulmonary. Overall feels breathing is stable.  Follow.        CKD (chronic kidney disease), stage III (HCC)    Avoid antiinflammatories.  Follow metabolic panel.        Essential hypertension    Continue aldactone, entresto, metoprolol and lasix.  Follow pressures.        Relevant Orders   Basic metabolic panel   Foot fracture    Recent foot injury as outlined.  Xray as outlined.  Has appt with podiatry tomorrow.  Swelling improved.        History of colon cancer    Recently evaluated by oncology for increase CEA.  PET ok.  Follow.        Hypercholesterolemia    Was on crestor.  Intolerance.  Off now.  Follow lipid panel.        Relevant Orders   Lipid panel   Hyperglycemia    Follow met b and a1c.        Relevant Orders   Hemoglobin A1c   Leukopenia    Follow cbc.        Relevant Orders   CBC with Differential/Platelet   NICM (nonischemic cardiomyopathy) (HCC)    Taking metoprolol, spironolactone, entrestro and imdur.  Has lasix to take if needed.  Weight has been stable.  Follow.       Chronic systolic heart failure (HCC)  (Chronic)    Taking metoprolol, spironolactone, entrestro and imdur.  Has lasix to take if needed.  Weight has been stable.  Follow.          Einar Pheasant, MD

## 2021-06-28 ENCOUNTER — Encounter: Payer: Self-pay | Admitting: Internal Medicine

## 2021-06-28 DIAGNOSIS — S92909A Unspecified fracture of unspecified foot, initial encounter for closed fracture: Secondary | ICD-10-CM | POA: Insufficient documentation

## 2021-06-28 NOTE — Assessment & Plan Note (Signed)
Avoid antiinflammatories.  Follow metabolic panel.  

## 2021-06-28 NOTE — Assessment & Plan Note (Signed)
Follow liver function tests.   

## 2021-06-28 NOTE — Assessment & Plan Note (Signed)
Was on crestor. Intolerance.  Off now.  Follow lipid panel.  

## 2021-06-28 NOTE — Assessment & Plan Note (Addendum)
Taking metoprolol, spironolactone, entrestro and imdur.  Has lasix to take if needed.  Weight has been stable.  Follow.  

## 2021-06-28 NOTE — Assessment & Plan Note (Addendum)
Previously evaluated by pulmonary. Overall feels breathing is stable.  Follow.

## 2021-06-28 NOTE — Assessment & Plan Note (Signed)
Recently evaluated by oncology for increase CEA.  PET ok.  Follow.  

## 2021-06-28 NOTE — Assessment & Plan Note (Signed)
Taking metoprolol, spironolactone, entrestro and imdur.  Has lasix to take if needed.  Weight has been stable.  Follow.  

## 2021-06-28 NOTE — Assessment & Plan Note (Signed)
Follow met b and a1c.  

## 2021-06-28 NOTE — Assessment & Plan Note (Signed)
Continue aldactone, entresto, metoprolol and lasix.  Follow pressures.  

## 2021-06-28 NOTE — Assessment & Plan Note (Signed)
Intolerance to crestor.  Off now.  Continue blood pressure control.

## 2021-06-28 NOTE — Assessment & Plan Note (Addendum)
Recent foot injury as outlined.  Xray as outlined.  Has appt with podiatry tomorrow.  Swelling improved.

## 2021-06-28 NOTE — Assessment & Plan Note (Signed)
Follow cbc.  

## 2021-06-29 ENCOUNTER — Other Ambulatory Visit: Payer: Self-pay

## 2021-06-29 ENCOUNTER — Other Ambulatory Visit: Payer: Self-pay | Admitting: Cardiovascular Disease

## 2021-07-16 ENCOUNTER — Encounter: Payer: Self-pay | Admitting: Pulmonary Disease

## 2021-07-16 ENCOUNTER — Ambulatory Visit: Payer: Medicare HMO | Admitting: Pulmonary Disease

## 2021-07-16 VITALS — BP 124/72 | HR 89 | Temp 97.9°F | Ht 62.0 in | Wt 131.0 lb

## 2021-07-16 DIAGNOSIS — J449 Chronic obstructive pulmonary disease, unspecified: Secondary | ICD-10-CM

## 2021-07-16 MED ORDER — FLUTICASONE PROPIONATE 50 MCG/ACT NA SUSP
1.0000 | Freq: Every day | NASAL | 2 refills | Status: DC
Start: 2021-07-16 — End: 2023-11-17

## 2021-07-16 MED ORDER — FLUTICASONE FUROATE-VILANTEROL 100-25 MCG/ACT IN AEPB: 1.0000 | INHALATION_SPRAY | Freq: Every day | RESPIRATORY_TRACT | 5 refills | Status: DC

## 2021-07-16 NOTE — Progress Notes (Signed)
[facility]  Chief Complaint  Patient presents with   pulmonary consult    Per Dr. Rosilyn Mings of covid-since she has had occ dry cough and SOB with exertion.     Past Surgical History:  She  has a past surgical history that includes Thyroidectomy, partial (1956); Abdominal hysterectomy; Tubal ligation; Inguinal hernia repair (Left, 05/04/2016); and Breast biopsy (Left, 4/15`).  Past Medical History:  Systolic CHF, Colon cancer 2005, HLD, HTN, NSVT, Osteoporosis  Constitutional:  BP 124/72 (BP Location: Left Arm, Cuff Size: Normal)   Pulse 89   Temp 97.9 F (36.6 C) (Temporal)   Ht '5\' 2"'$  (1.575 m)   Wt 131 lb (59.4 kg)   LMP 02/21/1966   SpO2 97%   BMI 23.96 kg/m   Brief Summary:  Kathy Dominguez is a 88 y.o. female with chronic cough.      Subjective:   She was seen by Dr. Raul Del with pulmonary in 2018.  PFT from then moderate to severe obstruction with bronchodilator response.    Chest xray from 04/21/21 was normal.  She had COVID in 2021 and 2023.  She feels her cough has gotten worse after these.  She has seasonal allergies and gets sinus congestion with runny nose.  She gets a tickle in her throat and has to clear her throat.  She gets winded sometimes with activity.  She doesn't usually cough up phlegm or having wheezing.  She uses albuterol intermittently and this helps.  No issues with her breathing while asleep.  She is from [place].  Did office work.  Had pneumonia years ago.  No history of TB.  No animal/bird exposures.  She never smoked, but her husband did.  Physical Exam:   Appearance - well kempt   ENMT - no sinus tenderness, no oral exudate, no LAN, Mallampati 2 airway, no stridor, clear nasal congestion  Respiratory - equal breath sounds bilaterally, no wheezing or rales  CV - s1s2 regular rate and rhythm, no murmurs  Ext - no clubbing, no edema  Skin - no rashes  Psych - normal mood and  affect   Pulmonary testing:  PFT 02/24/16 >> FEV1 0.84 (59%), FEV1% 59, TLC 88%, DLCO 48%, +BD  Chest Imaging:  CT chest 11/23/19 >> 5 mm nodule Rt apex stable since 2008  Cardiac Tests:  Echo 09/28/16 >> EF 50 to 55%, grade 1 DD  Social History:  She  reports that she has never smoked. She has never used smokeless tobacco. She reports that she does not drink alcohol and does not use drugs.  Family History:  Her family history includes Breast cancer in her cousin; Breast cancer (age of onset: 77) in her daughter; Colon cancer in her father; Stroke in her mother.     Assessment/Plan:   Chronic cough. - related to COPD with asthma and allergic rhinitis made worse after recurrent COVID infections - will arrange for pulmonary function test - will have her use breo 100 one puff daily and flonase 1 spray daily - prn albuterol  Time Spent Involved in Patient Care on Day of Examination:  50 minutes  Follow up:   Patient Instructions  Breo one puff daily, and rinse your mouth after each use.  Flonase 1 spray in each nostril daily.  Will schedule pulmonary function test.  Follow up in 6 weeks.  Medication List:   Allergies as of 07/16/2021   No Known Allergies  Medication List        Accurate as of July 16, 2021 10:33 AM. If you have any questions, ask your nurse or doctor.          acetaminophen 325 MG tablet Commonly known as: TYLENOL Take 650 mg by mouth every 6 (six) hours as needed.   albuterol 108 (90 Base) MCG/ACT inhaler Commonly known as: VENTOLIN HFA Inhale 2 puffs into the lungs every 6 (six) hours as needed for wheezing or shortness of breath.   aspirin EC 81 MG tablet Take 81 mg by mouth daily. Swallow whole.   Entresto 97-103 MG Generic drug: sacubitril-valsartan Take 1 tablet by mouth 2 (two) times daily.   fluticasone 50 MCG/ACT nasal spray Commonly known as: FLONASE Place 1 spray into both nostrils daily. Started by: Chesley Mires,  MD   fluticasone furoate-vilanterol 100-25 MCG/ACT Aepb Commonly known as: Breo Ellipta Inhale 1 puff into the lungs daily. Started by: Chesley Mires, MD   furosemide 40 MG tablet Commonly known as: LASIX Take one tablet  ('40MG'$ ) if weight is greater than 130.   isosorbide mononitrate 30 MG 24 hr tablet Commonly known as: IMDUR Take 1 tablet (30 mg total) by mouth daily.   metoprolol succinate 25 MG 24 hr tablet Commonly known as: TOPROL-XL Take 1 tablet (25 mg total) by mouth daily.   senna-docusate 8.6-50 MG tablet Commonly known as: Senokot-S Take 2 tablets by mouth 2 (two) times daily as needed for mild constipation.   spironolactone 25 MG tablet Commonly known as: ALDACTONE Take 1 tablet (25 mg total) by mouth daily.   Vitamin D-3 25 MCG (1000 UT) Caps Take 2,000 Units by mouth daily.        Signature:  Chesley Mires, MD [place] Pager - [phone] 07/16/2021, 10:34 AM

## 2021-07-16 NOTE — Patient Instructions (Signed)
Breo one puff daily, and rinse your mouth after each use.  Flonase 1 spray in each nostril daily.  Will schedule pulmonary function test.  Follow up in 6 weeks.

## 2021-08-12 ENCOUNTER — Other Ambulatory Visit: Payer: Self-pay | Admitting: Cardiovascular Disease

## 2021-09-15 ENCOUNTER — Ambulatory Visit: Payer: Medicare HMO | Attending: Pulmonary Disease

## 2021-09-15 DIAGNOSIS — J449 Chronic obstructive pulmonary disease, unspecified: Secondary | ICD-10-CM | POA: Insufficient documentation

## 2021-09-15 LAB — PULMONARY FUNCTION TEST ARMC ONLY
DL/VA: 2.66 ml/min/mmHg/L
DLCO unc: 3.74 ml/min/mmHg
FEF 25-75 Post: 0.26 L/sec
FEF 25-75 Pre: 0.31 L/sec
FEF2575-%Change-Post: -15 %
FEF2575-%Pred-Post: 36 %
FEF2575-%Pred-Pre: 43 %
FEV1-%Change-Post: -7 %
FEV1-%Pred-Post: 49 %
FEV1-%Pred-Pre: 53 %
FEV1-Post: 0.67 L
FEV1-Pre: 0.72 L
FEV1FVC-%Change-Post: 8 %
FEV1FVC-%Pred-Pre: 71 %
FEV6-%Change-Post: -13 %
FEV6-%Pred-Post: 70 %
FEV6-%Pred-Pre: 81 %
FEV6-Post: 1.21 L
FEV6-Pre: 1.4 L
FEV6FVC-%Change-Post: 1 %
FEV6FVC-%Pred-Post: 108 %
FEV6FVC-%Pred-Pre: 106 %
FVC-%Change-Post: -15 %
FVC-%Pred-Post: 65 %
FVC-%Pred-Pre: 76 %
FVC-Post: 1.21 L
FVC-Pre: 1.43 L
Post FEV1/FVC ratio: 55 %
Post FEV6/FVC ratio: 100 %
Pre FEV1/FVC ratio: 51 %
Pre FEV6/FVC Ratio: 98 %
RV % pred: 188 %
RV: 4.73 L
TLC % pred: 126 %
TLC: 6.03 L

## 2021-09-15 MED ORDER — ALBUTEROL SULFATE (2.5 MG/3ML) 0.083% IN NEBU
2.5000 mg | INHALATION_SOLUTION | Freq: Once | RESPIRATORY_TRACT | Status: AC
  Administered 2021-09-15: 2.5 mg via RESPIRATORY_TRACT
  Filled 2021-09-15: qty 3

## 2021-09-17 ENCOUNTER — Other Ambulatory Visit (INDEPENDENT_AMBULATORY_CARE_PROVIDER_SITE_OTHER): Payer: Medicare HMO

## 2021-09-17 DIAGNOSIS — E7800 Pure hypercholesterolemia, unspecified: Secondary | ICD-10-CM | POA: Diagnosis not present

## 2021-09-17 DIAGNOSIS — R7989 Other specified abnormal findings of blood chemistry: Secondary | ICD-10-CM | POA: Diagnosis not present

## 2021-09-17 DIAGNOSIS — D72819 Decreased white blood cell count, unspecified: Secondary | ICD-10-CM

## 2021-09-17 DIAGNOSIS — R739 Hyperglycemia, unspecified: Secondary | ICD-10-CM

## 2021-09-17 DIAGNOSIS — I10 Essential (primary) hypertension: Secondary | ICD-10-CM | POA: Diagnosis not present

## 2021-09-17 LAB — HEPATIC FUNCTION PANEL
ALT: 7 U/L (ref 0–35)
AST: 14 U/L (ref 0–37)
Albumin: 4.3 g/dL (ref 3.5–5.2)
Alkaline Phosphatase: 57 U/L (ref 39–117)
Bilirubin, Direct: 0.1 mg/dL (ref 0.0–0.3)
Total Bilirubin: 0.4 mg/dL (ref 0.2–1.2)
Total Protein: 7 g/dL (ref 6.0–8.3)

## 2021-09-17 LAB — CBC WITH DIFFERENTIAL/PLATELET
Basophils Absolute: 0 10*3/uL (ref 0.0–0.1)
Basophils Relative: 0.5 % (ref 0.0–3.0)
Eosinophils Absolute: 0 10*3/uL (ref 0.0–0.7)
Eosinophils Relative: 0.2 % (ref 0.0–5.0)
HCT: 37.6 % (ref 36.0–46.0)
Hemoglobin: 12.3 g/dL (ref 12.0–15.0)
Lymphocytes Relative: 38.6 % (ref 12.0–46.0)
Lymphs Abs: 1.4 10*3/uL (ref 0.7–4.0)
MCHC: 32.9 g/dL (ref 30.0–36.0)
MCV: 91.6 fl (ref 78.0–100.0)
Monocytes Absolute: 0.4 10*3/uL (ref 0.1–1.0)
Monocytes Relative: 11.1 % (ref 3.0–12.0)
Neutro Abs: 1.8 10*3/uL (ref 1.4–7.7)
Neutrophils Relative %: 49.6 % (ref 43.0–77.0)
Platelets: 176 10*3/uL (ref 150.0–400.0)
RBC: 4.1 Mil/uL (ref 3.87–5.11)
RDW: 13.5 % (ref 11.5–15.5)
WBC: 3.7 10*3/uL — ABNORMAL LOW (ref 4.0–10.5)

## 2021-09-17 LAB — BASIC METABOLIC PANEL
BUN: 28 mg/dL — ABNORMAL HIGH (ref 6–23)
CO2: 31 mEq/L (ref 19–32)
Calcium: 9.5 mg/dL (ref 8.4–10.5)
Chloride: 101 mEq/L (ref 96–112)
Creatinine, Ser: 1.48 mg/dL — ABNORMAL HIGH (ref 0.40–1.20)
GFR: 30.77 mL/min — ABNORMAL LOW (ref >60.00)
Glucose, Bld: 94 mg/dL (ref 70–99)
Potassium: 4.8 mEq/L (ref 3.5–5.1)
Sodium: 140 mEq/L (ref 135–145)

## 2021-09-17 LAB — HEMOGLOBIN A1C: Hgb A1c MFr Bld: 6.3 % (ref 4.6–6.5)

## 2021-09-17 LAB — LIPID PANEL
Cholesterol: 229 mg/dL — ABNORMAL HIGH (ref 0–200)
HDL: 80.8 mg/dL (ref >39.00)
LDL Cholesterol: 136 mg/dL — ABNORMAL HIGH (ref 0–99)
NonHDL: 147.76
Total CHOL/HDL Ratio: 3
Triglycerides: 58 mg/dL (ref 0.0–149.0)
VLDL: 11.6 mg/dL (ref 0.0–40.0)

## 2021-09-21 ENCOUNTER — Encounter: Payer: Self-pay | Admitting: Internal Medicine

## 2021-09-21 ENCOUNTER — Ambulatory Visit (INDEPENDENT_AMBULATORY_CARE_PROVIDER_SITE_OTHER): Payer: Medicare HMO | Admitting: Internal Medicine

## 2021-09-21 VITALS — BP 121/64 | HR 68 | Temp 97.9°F | Ht 62.0 in | Wt 136.6 lb

## 2021-09-21 DIAGNOSIS — I10 Essential (primary) hypertension: Secondary | ICD-10-CM

## 2021-09-21 DIAGNOSIS — I700 Atherosclerosis of aorta: Secondary | ICD-10-CM

## 2021-09-21 DIAGNOSIS — Z0000 Encounter for general adult medical examination without abnormal findings: Secondary | ICD-10-CM

## 2021-09-21 DIAGNOSIS — R739 Hyperglycemia, unspecified: Secondary | ICD-10-CM

## 2021-09-21 DIAGNOSIS — I110 Hypertensive heart disease with heart failure: Secondary | ICD-10-CM

## 2021-09-21 DIAGNOSIS — M25561 Pain in right knee: Secondary | ICD-10-CM

## 2021-09-21 DIAGNOSIS — I428 Other cardiomyopathies: Secondary | ICD-10-CM | POA: Diagnosis not present

## 2021-09-21 DIAGNOSIS — J4520 Mild intermittent asthma, uncomplicated: Secondary | ICD-10-CM

## 2021-09-21 DIAGNOSIS — R32 Unspecified urinary incontinence: Secondary | ICD-10-CM

## 2021-09-21 DIAGNOSIS — N1832 Chronic kidney disease, stage 3b: Secondary | ICD-10-CM

## 2021-09-21 DIAGNOSIS — R7989 Other specified abnormal findings of blood chemistry: Secondary | ICD-10-CM

## 2021-09-21 DIAGNOSIS — I5022 Chronic systolic (congestive) heart failure: Secondary | ICD-10-CM

## 2021-09-21 DIAGNOSIS — E7800 Pure hypercholesterolemia, unspecified: Secondary | ICD-10-CM

## 2021-09-21 DIAGNOSIS — Z8639 Personal history of other endocrine, nutritional and metabolic disease: Secondary | ICD-10-CM

## 2021-09-21 DIAGNOSIS — Z85038 Personal history of other malignant neoplasm of large intestine: Secondary | ICD-10-CM

## 2021-09-21 NOTE — Progress Notes (Signed)
[name], female   DOB: Jun 05, 1928, 88 y.o.   MRN: [id number]   Subjective:    [name], female    DOB: [birth year], 88 y.o.   MRN: [id number]   Patient here for her physical exam.   Chief Complaint  Patient presents with   Annual Exam   .   HPI Here to follow up regarding history of CHF, hypertension and hypercholesterolemia.  Saw pulmonary 06/2021 - chronic cough - felt related to COPD with asthma and allergic rhinitis.  Recommended PFTs and Breo and flonase.  Albuterol prn.  Overall feels breathing is stable.  No chest pain.  No nausea or vomiting.  Bowels moving.  Reports increased urinary frequency and inability to hold her urine.  Discussed further evaluation.  Request referral to urology.  No dysuria.  Some right knee pain.  Therapy previously helped.  Previously saw Dr Sabra Heck.  Request referral back.  Discussed bone density.     Past Medical History:  Diagnosis Date   Asthma    Chronic systolic CHF (congestive heart failure) ([place])    a. 01/2015 Echo: EF 25-30%, sev diff HK, mild to mod MR, mildly dil LA, nl RV fxn, PASP 61 mmHg.   Colon cancer ([place]) 2005   Hypercholesterolemia    Hypertension    Hypertensive heart disease with CHF (congestive heart failure) ([place])    NICM (nonischemic cardiomyopathy) ([place])    a. 01/2015 Echo: EF 25-30% sev diff HK;  b. 01/2015 [facility] MV: EF 19%, small defect of mild severity in apex - likely breast attenuation, no ischemia.   NSVT (nonsustained ventricular tachycardia) (HCC)    Osteoporosis    Past Surgical History:  Procedure Laterality Date   ABDOMINAL HYSTERECTOMY     BREAST BIOPSY Left 4/15`   BENIGN BREAST EPITHELIUM WITH NODULAR FIBROSIS.   INGUINAL HERNIA REPAIR Left 05/04/2016   Procedure: HERNIA REPAIR INGUINAL ADULT;  Surgeon: Robert Bellow, MD;  Location: [facility] ORS;  Service: General;  Laterality: Left;   THYROIDECTOMY, PARTIAL  1956   TUBAL LIGATION     Family History  Problem  Relation Age of Onset   Stroke Mother    Colon cancer Father    Breast cancer Daughter 37   Breast cancer Cousin    Social History   Socioeconomic History   Marital status: Widowed    Spouse name: Not on file   Number of children: Not on file   Years of education: Not on file   Highest education level: Not on file  Occupational History   Not on file  Tobacco Use   Smoking status: Never   Smokeless tobacco: Never  Vaping Use   Vaping Use: Never used  Substance and Sexual Activity   Alcohol use: No    Alcohol/week: 0.0 standard drinks of alcohol   Drug use: No   Sexual activity: Not on file  Other Topics Concern   Not on file  Social History Narrative   Not on file   Social Determinants of Health   Financial Resource Strain: Low Risk  (11/26/2020)   Overall Financial Resource Strain (CARDIA)    Difficulty of Paying Living Expenses: Not hard at all  Food Insecurity: No Food Insecurity (11/26/2020)   Hunger Vital Sign    Worried About Running Out of Food in the Last Year: Never true    [place] in the Last Year: Never true  Transportation Needs: No Transportation Needs (11/26/2020)  PRAPARE - Hydrologist (Medical): No    Lack of Transportation (Non-Medical): No  Physical Activity: Insufficiently Active (11/26/2020)   Exercise Vital Sign    Days of Exercise per Week: 1 day    Minutes of Exercise per Session: 10 min  Stress: No Stress Concern Present (11/26/2020)   Theresa    Feeling of Stress : Not at all  Social Connections: Unknown (11/26/2020)   Social Connection and Isolation Panel [NHANES]    Frequency of Communication with Friends and Family: More than three times a week    Frequency of Social Gatherings with Friends and Family: Twice a week    Attends Religious Services: More than 4 times per year    Active Member of [employer] or Organizations: No    Attends English as a second language teacher Meetings: Never    Marital Status: Not on file     Review of Systems  Constitutional:  Negative for fever and unexpected weight change.  HENT:  Negative for congestion, sinus pressure and sore throat.   Eyes:  Negative for pain and visual disturbance.  Respiratory:  Negative for cough and chest tightness.        Breathing overall stable.   Cardiovascular:  Negative for chest pain and palpitations.  Gastrointestinal:  Negative for abdominal pain, diarrhea, nausea and vomiting.  Genitourinary:  Negative for difficulty urinating and dysuria.  Musculoskeletal:  Negative for joint swelling and myalgias.       Knee pain as outlined.   Skin:  Negative for color change and rash.  Neurological:  Negative for dizziness, light-headedness and headaches.  Hematological:  Negative for adenopathy. Does not bruise/bleed easily.  Psychiatric/Behavioral:  Negative for agitation and dysphoric mood.        Objective:     BP 121/64 (BP Location: Left Arm, Patient Position: Sitting, Cuff Size: Normal)   Pulse 68   Temp 97.9 F (36.6 C) (Oral)   Ht $R'5\' 2"'NO$  (1.575 m)   Wt 136 lb 9.6 oz (62 kg)   LMP 02/21/1966   SpO2 95%   BMI 24.98 kg/m  Wt Readings from Last 3 Encounters:  09/24/21 134 lb 9.6 oz (61.1 kg)  09/21/21 136 lb 9.6 oz (62 kg)  07/16/21 131 lb (59.4 kg)    Physical Exam Vitals reviewed.  Constitutional:      General: She is not in acute distress.    Appearance: Normal appearance. She is well-developed.  HENT:     Head: Normocephalic and atraumatic.     Right Ear: External ear normal.     Left Ear: External ear normal.  Eyes:     General: No scleral icterus.       Right eye: No discharge.        Left eye: No discharge.     Conjunctiva/sclera: Conjunctivae normal.  Neck:     Thyroid: No thyromegaly.  Cardiovascular:     Rate and Rhythm: Normal rate and regular rhythm.  Pulmonary:     [place]: No tachypnea, accessory muscle usage or respiratory distress.      Breath sounds: Normal breath sounds. No decreased breath sounds or wheezing.  Chest:  Breasts:    Right: No inverted nipple, mass, nipple discharge or tenderness (no axillary adenopathy).     Left: No inverted nipple, mass, nipple discharge or tenderness (no axilarry adenopathy).  Abdominal:     General: Bowel sounds are normal.     Palpations:  Abdomen is soft.     Tenderness: There is no abdominal tenderness.  Musculoskeletal:        General: No swelling or tenderness.     Cervical back: Neck supple.  Lymphadenopathy:     Cervical: No cervical adenopathy.  Skin:    Findings: No erythema or rash.  Neurological:     Mental Status: She is alert and oriented to person, place, and time.  Psychiatric:        Mood and Affect: Mood normal.        Behavior: Behavior normal.      Outpatient Encounter Medications as of 09/21/2021  Medication Sig   acetaminophen (TYLENOL) 325 MG tablet Take 650 mg by mouth every 6 (six) hours as needed.   albuterol (VENTOLIN HFA) 108 (90 Base) MCG/ACT inhaler Inhale 2 puffs into the lungs every 6 (six) hours as needed for wheezing or shortness of breath.   aspirin 81 MG EC tablet Take 81 mg by mouth daily. Swallow whole.   Cholecalciferol (VITAMIN D-3) 1000 UNITS CAPS Take 2,000 Units by mouth daily.   fluticasone (FLONASE) 50 MCG/ACT nasal spray Place 1 spray into both nostrils daily.   fluticasone furoate-vilanterol (BREO ELLIPTA) 100-25 MCG/ACT AEPB Inhale 1 puff into the lungs daily.   furosemide (LASIX) 40 MG tablet Take 1/2 tablet-1 tablet as directed per weight gain.   isosorbide mononitrate (IMDUR) 30 MG 24 hr tablet Take 1 tablet (30 mg total) by mouth daily.   metoprolol succinate (TOPROL-XL) 25 MG 24 hr tablet Take 1 tablet (25 mg total) by mouth daily.   sacubitril-valsartan (ENTRESTO) 97-103 MG Take 1 tablet by mouth 2 (two) times daily.   senna-docusate (SENOKOT-S) 8.6-50 MG tablet Take 2 tablets by mouth 2 (two) times daily as needed for mild  constipation.   spironolactone (ALDACTONE) 25 MG tablet Take 1 tablet (25 mg total) by mouth daily.   No facility-administered encounter medications on file as of 09/21/2021.     Lab Results  Component Value Date   WBC 3.7 (L) 09/17/2021   HGB 12.3 09/17/2021   HCT 37.6 09/17/2021   PLT 176.0 09/17/2021   GLUCOSE 94 09/17/2021   CHOL 229 (H) 09/17/2021   TRIG 58.0 09/17/2021   HDL 80.80 09/17/2021   LDLDIRECT 128.3 02/08/2013   LDLCALC 136 (H) 09/17/2021   ALT 7 09/17/2021   AST 14 09/17/2021   NA 140 09/17/2021   K 4.8 09/17/2021   CL 101 09/17/2021   CREATININE 1.48 (H) 09/17/2021   BUN 28 (H) 09/17/2021   CO2 31 09/17/2021   TSH 0.78 04/20/2021   INR 0.9 06/28/2019   HGBA1C 6.3 09/17/2021    MM 3D SCREEN BREAST BILATERAL  Result Date: 11/27/2020 CLINICAL DATA:  Screening. EXAM: DIGITAL SCREENING BILATERAL MAMMOGRAM WITH TOMOSYNTHESIS AND CAD TECHNIQUE: Bilateral screening digital craniocaudal and mediolateral oblique mammograms were obtained. Bilateral screening digital breast tomosynthesis was performed. The images were evaluated with computer-aided detection. COMPARISON:  Previous exam(s). ACR Breast Density Category c: The breast tissue is heterogeneously dense, which may obscure small masses. FINDINGS: There are no findings suspicious for malignancy. IMPRESSION: No mammographic evidence of malignancy. A result letter of this screening mammogram will be mailed directly to the patient. RECOMMENDATION: Screening mammogram in one year. (Code:SM-B-01Y) BI-RADS CATEGORY  1: Negative. Electronically Signed   By: Ammie Ferrier M.D.   On: 11/27/2020 13:22      Assessment & Plan:   Problem List Items Addressed This Visit     Abnormal  liver function tests    Recent liver panel 09/17/21 - wnl.       Aortic atherosclerosis ([place])    Was on crestor. Intolerance.  Off now.  Follow lipid panel.       Asthma    Breathing stable.  Follow.       CKD (chronic kidney disease),  stage III (HCC) - Primary    Avoid antiinflammatories.  Follow metabolic panel.       Relevant Orders   Basic metabolic panel   Urinalysis, Routine w reflex microscopic   Essential hypertension    Continue aldactone, entresto, metoprolol and lasix.  Follow pressures.       Health care maintenance    Physical today 09/21/21.  S/p hysterectomy.  Mammogram 11/27/20 - Birads I.  Colonoscopy 2014.  Saw oncology - PET - ok.       History of colon cancer    Recently evaluated by oncology for increase CEA.  PET ok.  Follow.       Hypercholesterolemia    Was on crestor. Intolerance.  Off now.  Follow lipid panel.       Hyperglycemia    Follow met b and a1c.       Hypertensive heart disease with CHF (congestive heart failure) (HCC)    No evidence of volume overload on exam.  Continue entrestro, aldactone, lasix and metoprolol.  Follow.       NICM (nonischemic cardiomyopathy) (HCC)    Taking metoprolol, spironolactone, entrestro and imdur.  Has lasix to take if needed.  Weight has been stable.  Follow.       Right knee pain    Previously saw Dr Sabra Heck.  Therapy helped.  Some increased pain.  Refer back to Dr Sabra Heck.       Relevant Orders   Ambulatory referral to Orthopedic Surgery   Urinary incontinence    With urinary frequency and incontinence.  Request referral to urology.       Relevant Orders   Ambulatory referral to Urology   Chronic systolic heart failure (HCC) (Chronic)    Taking metoprolol, spironolactone, entrestro and imdur.  Has lasix to take if needed.  Weight has been stable.  Follow.       Other Visit Diagnoses     H/O estrogen deficiency       Relevant Orders   DG Bone Density        Einar Pheasant, MD

## 2021-09-24 ENCOUNTER — Telehealth: Payer: Self-pay | Admitting: Internal Medicine

## 2021-09-24 ENCOUNTER — Encounter: Payer: Self-pay | Admitting: Pulmonary Disease

## 2021-09-24 ENCOUNTER — Ambulatory Visit: Payer: Medicare HMO | Admitting: Pulmonary Disease

## 2021-09-24 VITALS — BP 100/86 | HR 67 | Temp 97.9°F | Ht 62.0 in | Wt 134.6 lb

## 2021-09-24 DIAGNOSIS — J449 Chronic obstructive pulmonary disease, unspecified: Secondary | ICD-10-CM | POA: Diagnosis not present

## 2021-09-24 NOTE — Progress Notes (Signed)
[facility]  Chief Complaint  Patient presents with   Follow-up    Past Surgical History:  She  has a past surgical history that includes Thyroidectomy, partial (1956); Abdominal hysterectomy; Tubal ligation; Inguinal hernia repair (Left, 05/04/2016); and Breast biopsy (Left, 4/15`).  Past Medical History:  Systolic CHF, Colon cancer 2005, HLD, HTN, NSVT, Osteoporosis  Constitutional:  BP 100/86 (BP Location: Left Arm, Patient Position: Sitting, Cuff Size: Normal)   Pulse 67   Temp 97.9 F (36.6 C) (Oral)   Ht '5\' 2"'$  (1.575 m)   Wt 134 lb 9.6 oz (61.1 kg)   LMP 02/21/1966   SpO2 93%   BMI 24.62 kg/m   Brief Summary:  Kathy Dominguez is a 88 y.o. female with chronic cough.      Subjective:   Breathing and cough are better.  Not having sinus congestion.  Breo helps.  Not needing to use albuterol much.  Keeps up with activity without feeling short of breath.  Sleeping okay.  Physical Exam:   Appearance - well kempt   ENMT - no sinus tenderness, no oral exudate, no LAN, Mallampati 3 airway, no stridor  Respiratory - equal breath sounds bilaterally, no wheezing or rales  CV - s1s2 regular rate and rhythm, no murmurs  Ext - no clubbing, no edema  Skin - no rashes  Psych - normal mood and affect    Pulmonary testing:  PFT 02/24/16 >> FEV1 0.84 (59%), FEV1% 59, TLC 88%, DLCO 48%, +BD PFT 09/15/21 >> FEV1 0.72 (53%), FEV1% 51, TLC 6.03 (126%)  Chest Imaging:  CT chest 11/23/19 >> 5 mm nodule Rt apex stable since 2008  Cardiac Tests:  Echo 09/28/16 >> EF 50 to 55%, grade 1 DD  Social History:  She  reports that she has never smoked. She has never used smokeless tobacco. She reports that she does not drink alcohol and does not use drugs.  Family History:  Her family history includes Breast cancer in her cousin; Breast cancer (age of onset: 81) in her daughter; Colon cancer in her father; Stroke in her mother.      Assessment/Plan:   COPD with asthma. - continue breo 100 one puff daily - prn albuterol - discussed vaccine schedule for RSV, influenza, and COVID booster over next couple of months  Allergic rhinitis. - continue flonase  Time Spent Involved in Patient Care on Day of Examination:  26 minutes  Follow up:   Patient Instructions  Follow up in 6 months  Medication List:   Allergies as of 09/24/2021   No Known Allergies      Medication List        Accurate as of September 24, 2021 10:48 AM. If you have any questions, ask your nurse or doctor.          acetaminophen 325 MG tablet Commonly known as: TYLENOL Take 650 mg by mouth every 6 (six) hours as needed.   albuterol 108 (90 Base) MCG/ACT inhaler Commonly known as: VENTOLIN HFA Inhale 2 puffs into the lungs every 6 (six) hours as needed for wheezing or shortness of breath.   aspirin EC 81 MG tablet Take 81 mg by mouth daily. Swallow whole.   Entresto 97-103 MG Generic drug: sacubitril-valsartan Take 1 tablet by mouth 2 (two) times daily.   fluticasone 50 MCG/ACT nasal spray Commonly known as: FLONASE Place 1 spray into both nostrils daily.   fluticasone furoate-vilanterol 100-25 MCG/ACT Aepb Commonly known as:  Breo Ellipta Inhale 1 puff into the lungs daily.   furosemide 40 MG tablet Commonly known as: LASIX Take 1/2 tablet-1 tablet as directed per weight gain.   isosorbide mononitrate 30 MG 24 hr tablet Commonly known as: IMDUR Take 1 tablet (30 mg total) by mouth daily.   metoprolol succinate 25 MG 24 hr tablet Commonly known as: TOPROL-XL Take 1 tablet (25 mg total) by mouth daily.   senna-docusate 8.6-50 MG tablet Commonly known as: Senokot-S Take 2 tablets by mouth 2 (two) times daily as needed for mild constipation.   spironolactone 25 MG tablet Commonly known as: ALDACTONE Take 1 tablet (25 mg total) by mouth daily.   Vitamin D-3 25 MCG (1000 UT) Caps Take 2,000 Units by mouth  daily.        Signature:  Chesley Mires, MD [place] Pager - [phone] 09/24/2021, 10:48 AM

## 2021-09-24 NOTE — Telephone Encounter (Signed)
Patient has a lab appt 09/30/2021, there are no orders in.

## 2021-09-24 NOTE — Patient Instructions (Signed)
Follow up in 6 months 

## 2021-09-25 NOTE — Telephone Encounter (Signed)
Orders placed for labs

## 2021-09-28 ENCOUNTER — Encounter: Payer: Self-pay | Admitting: Internal Medicine

## 2021-09-28 DIAGNOSIS — R32 Unspecified urinary incontinence: Secondary | ICD-10-CM | POA: Insufficient documentation

## 2021-09-28 NOTE — Assessment & Plan Note (Signed)
Was on crestor. Intolerance.  Off now.  Follow lipid panel.  

## 2021-09-28 NOTE — Assessment & Plan Note (Signed)
Avoid antiinflammatories.  Follow metabolic panel.  

## 2021-09-28 NOTE — Assessment & Plan Note (Signed)
No evidence of volume overload on exam.  Continue entrestro, aldactone, lasix and metoprolol.  Follow.  

## 2021-09-28 NOTE — Assessment & Plan Note (Signed)
With urinary frequency and incontinence.  Request referral to urology.

## 2021-09-28 NOTE — Assessment & Plan Note (Signed)
Taking metoprolol, spironolactone, entrestro and imdur.  Has lasix to take if needed.  Weight has been stable.  Follow.  

## 2021-09-28 NOTE — Assessment & Plan Note (Signed)
Breathing stable.  Follow.    

## 2021-09-28 NOTE — Assessment & Plan Note (Signed)
Follow met b and a1c.  

## 2021-09-28 NOTE — Assessment & Plan Note (Signed)
Continue aldactone, entresto, metoprolol and lasix.  Follow pressures.  

## 2021-09-28 NOTE — Assessment & Plan Note (Signed)
Recently evaluated by oncology for increase CEA.  PET ok.  Follow.  

## 2021-09-28 NOTE — Assessment & Plan Note (Signed)
Recent liver panel 09/17/21 - wnl.

## 2021-09-28 NOTE — Assessment & Plan Note (Addendum)
Physical today 09/21/21.  S/p hysterectomy.  Mammogram 11/27/20 - Birads I.  Colonoscopy 2014.  Saw oncology - PET - ok.

## 2021-09-28 NOTE — Assessment & Plan Note (Signed)
Previously saw Dr Sabra Heck.  Therapy helped.  Some increased pain.  Refer back to Dr Sabra Heck.

## 2021-09-29 ENCOUNTER — Other Ambulatory Visit: Payer: Self-pay | Admitting: Cardiovascular Disease

## 2021-10-01 ENCOUNTER — Other Ambulatory Visit (INDEPENDENT_AMBULATORY_CARE_PROVIDER_SITE_OTHER): Payer: Medicare HMO

## 2021-10-01 DIAGNOSIS — N1832 Chronic kidney disease, stage 3b: Secondary | ICD-10-CM | POA: Diagnosis not present

## 2021-10-02 LAB — BASIC METABOLIC PANEL
BUN: 33 mg/dL — ABNORMAL HIGH (ref 6–23)
CO2: 26 mEq/L (ref 19–32)
Calcium: 9.4 mg/dL (ref 8.4–10.5)
Chloride: 104 mEq/L (ref 96–112)
Creatinine, Ser: 1.36 mg/dL — ABNORMAL HIGH (ref 0.40–1.20)
GFR: 34.04 mL/min — ABNORMAL LOW (ref >60.00)
Glucose, Bld: 93 mg/dL (ref 70–99)
Potassium: 4.2 mEq/L (ref 3.5–5.1)
Sodium: 141 mEq/L (ref 135–145)

## 2021-10-19 ENCOUNTER — Encounter: Payer: Self-pay | Admitting: Urology

## 2021-10-19 ENCOUNTER — Ambulatory Visit: Payer: Medicare HMO | Admitting: Urology

## 2021-10-19 VITALS — BP 115/77 | HR 79 | Ht 62.0 in | Wt 133.6 lb

## 2021-10-19 DIAGNOSIS — N3941 Urge incontinence: Secondary | ICD-10-CM

## 2021-10-19 DIAGNOSIS — N3946 Mixed incontinence: Secondary | ICD-10-CM

## 2021-10-19 MED ORDER — MIRABEGRON ER 50 MG PO TB24: 50.0000 mg | ORAL_TABLET | Freq: Every day | ORAL | 0 refills | Status: DC

## 2021-10-19 NOTE — Patient Instructions (Signed)
Cystoscopy Cystoscopy is a procedure that is used to help diagnose and sometimes treat conditions that affect the lower urinary tract. The lower urinary tract includes the bladder and the urethra. The urethra is the tube that drains urine from the bladder. Cystoscopy is done using a thin, tube-shaped instrument with a light and camera at the end (cystoscope). The cystoscope may be hard or flexible, depending on the goal of the procedure. The cystoscope is inserted through the urethra, into the bladder. Cystoscopy may be recommended if you have: Urinary tract infections that keep coming back. Blood in the urine (hematuria). An inability to control when you urinate (urinary incontinence) or an overactive bladder. Unusual cells found in a urine sample. A blockage in the urethra, such as a urinary stone. Painful urination. An abnormality in the bladder found during an intravenous pyelogram (IVP) or CT scan. Cystoscopy may also be done to remove a sample of tissue to be examined under a microscope (biopsy). Tell a health care provider about: Any allergies you have. All medicines you are taking, including vitamins, herbs, eye drops, creams, and over-the-counter medicines. Any problems you or family members have had with anesthetic medicines. Any blood disorders you have. Any surgeries you have had. Any medical conditions you have. Whether you are pregnant or may be pregnant. What are the risks? Generally, this is a safe procedure. However, problems may occur, including: Infection. Bleeding. Allergic reactions to medicines. Damage to other structures or organs. What happens before the procedure? Medicines Ask your health care provider about: Changing or stopping your regular medicines. This is especially important if you are taking diabetes medicines or blood thinners. Taking medicines such as aspirin and ibuprofen. These medicines can thin your blood. Do not take these medicines unless your  health care provider tells you to take them. Taking over-the-counter medicines, vitamins, herbs, and supplements. Tests You may have an exam or testing, such as: X-rays of the bladder, urethra, or kidneys. CT scan of the abdomen or pelvis. Urine tests to check for signs of infection. General instructions Follow instructions from your health care provider about eating or drinking restrictions. Ask your health care provider what steps will be taken to help prevent infection. These steps may include: Washing skin with a germ-killing soap. Taking antibiotic medicine. Plan to have a responsible adult take you home from the hospital or clinic. What happens during the procedure?  You will be given one or more of the following: A medicine to help you relax (sedative). A medicine to numb the area (local anesthetic). The area around the opening of your urethra will be cleaned. The cystoscope will be passed through your urethra into your bladder. Germ-free (sterile) fluid will flow through the cystoscope to fill your bladder. The fluid will stretch your bladder so that your health care provider can clearly examine your bladder walls. Your doctor will look at the urethra and bladder. Your doctor may take a biopsy or remove stones. The cystoscope will be removed, and your bladder will be emptied. The procedure may vary among health care providers and hospitals. What can I expect after the procedure? After the procedure, it is common to have: Some soreness or pain in your abdomen and urethra. Urinary symptoms. These include: Mild pain or burning when you urinate. Pain should stop within a few minutes after you urinate. This may last for up to 1 week. A small amount of blood in your urine for several days. Feeling like you need to urinate but producing only   a small amount of urine. Follow these instructions at home: Medicines Take over-the-counter and prescription medicines only as told by your  health care provider. If you were prescribed an antibiotic medicine, take it as told by your health care provider. Do not stop taking the antibiotic even if you start to feel better. General instructions Return to your normal activities as told by your health care provider. Ask your health care provider what activities are safe for you. If you were given a sedative during the procedure, it can affect you for several hours. Do not drive or operate machinery until your health care provider says that it is safe. Watch for any blood in your urine. If the amount of blood in your urine increases, call your health care provider. Follow instructions from your health care provider about eating or drinking restrictions. If a tissue sample was removed for testing (biopsy) during your procedure, it is up to you to get your test results. Ask your health care provider, or the department that is doing the test, when your results will be ready. Drink enough fluid to keep your urine pale yellow. Keep all follow-up visits. This is important. Contact a health care provider if: You have pain that gets worse or does not get better with medicine, especially pain when you urinate. You have trouble urinating. You have more blood in your urine. Get help right away if: You have blood clots in your urine. You have abdominal pain. You have a fever or chills. You are unable to urinate. Summary Cystoscopy is a procedure that is used to help diagnose and sometimes treat conditions that affect the lower urinary tract. Cystoscopy is done using a thin, tube-shaped instrument with a light and camera at the end. After the procedure, it is common to have some soreness or pain in your abdomen and urethra. Watch for any blood in your urine. If the amount of blood in your urine increases, call your health care provider. If you were prescribed an antibiotic medicine, take it as told by your health care provider. Do not stop taking  the antibiotic even if you start to feel better. This information is not intended to replace advice given to you by your health care provider. Make sure you discuss any questions you have with your health care provider. Document Revised: 09/24/2020 Document Reviewed: 08/24/2019 Elsevier Patient Education  2023 [employer]  

## 2021-10-19 NOTE — Progress Notes (Signed)
10/19/2021 2:51 PM   Onalee Hua Lemont Fillers [birth year] [id number]  Referring provider: Einar Pheasant, [address] [place],  [place] [address]  Chief Complaint  Patient presents with   New Patient (Initial Visit)    Urinary incontinence     HPI: I was consulted to assist the patient's urgency incontinence.  She can have foot on the floor syndrome.  I think she wears 1 or 2 pads a day but it was difficult to quantitate.  No stress incontinence or bedwetting  She voids approximately every hour and every 2 hours at night  She has had a TIA and a hysterectomy  Denies a history of kidney stones bladder surgery bladder infections.  No treatment   PMH: Past Medical History:  Diagnosis Date   Asthma    Chronic systolic CHF (congestive heart failure) ([place])    a. 01/2015 Echo: EF 25-30%, sev diff HK, mild to mod MR, mildly dil LA, nl RV fxn, PASP 61 mmHg.   Colon cancer ([place]) 2005   Hypercholesterolemia    Hypertension    Hypertensive heart disease with CHF (congestive heart failure) ([place])    NICM (nonischemic cardiomyopathy) ([place])    a. 01/2015 Echo: EF 25-30% sev diff HK;  b. 01/2015 Lexi MV: EF 19%, small defect of mild severity in apex - likely breast attenuation, no ischemia.   NSVT (nonsustained ventricular tachycardia) (HCC)    Osteoporosis     Surgical History: Past Surgical History:  Procedure Laterality Date   ABDOMINAL HYSTERECTOMY     BREAST BIOPSY Left 4/15`   BENIGN BREAST EPITHELIUM WITH NODULAR FIBROSIS.   INGUINAL HERNIA REPAIR Left 05/04/2016   Procedure: HERNIA REPAIR INGUINAL ADULT;  Surgeon: Robert Bellow, MD;  Location: [facility] ORS;  Service: General;  Laterality: Left;   THYROIDECTOMY, PARTIAL  1956   TUBAL LIGATION      Home Medications:  Allergies as of 10/19/2021   No Known Allergies      Medication List        Accurate as of October 19, 2021  2:51 PM. If you have any questions, ask your nurse or doctor.           acetaminophen 325 MG tablet Commonly known as: TYLENOL Take 650 mg by mouth every 6 (six) hours as needed.   albuterol 108 (90 Base) MCG/ACT inhaler Commonly known as: VENTOLIN HFA Inhale 2 puffs into the lungs every 6 (six) hours as needed for wheezing or shortness of breath.   aspirin EC 81 MG tablet Take 81 mg by mouth daily. Swallow whole.   Entresto 97-103 MG Generic drug: sacubitril-valsartan Take 1 tablet by mouth 2 (two) times daily.   fluticasone 50 MCG/ACT nasal spray Commonly known as: FLONASE Place 1 spray into both nostrils daily.   fluticasone furoate-vilanterol 100-25 MCG/ACT Aepb Commonly known as: Breo Ellipta Inhale 1 puff into the lungs daily.   furosemide 40 MG tablet Commonly known as: LASIX Take 1/2 tablet-1 tablet as directed per weight gain.   isosorbide mononitrate 30 MG 24 hr tablet Commonly known as: IMDUR Take 1 tablet (30 mg total) by mouth daily.   metoprolol succinate 25 MG 24 hr tablet Commonly known as: TOPROL-XL Take 1 tablet (25 mg total) by mouth daily.   senna-docusate 8.6-50 MG tablet Commonly known as: Senokot-S Take 2 tablets by mouth 2 (two) times daily as needed for mild constipation.   spironolactone 25 MG tablet Commonly known as: ALDACTONE Take 1 tablet (25 mg total)  by mouth daily.   Vitamin D-3 25 MCG (1000 UT) Caps Take 2,000 Units by mouth daily.        Allergies: No Known Allergies  Family History: Family History  Problem Relation Age of Onset   Stroke Mother    Colon cancer Father    Breast cancer Daughter 63   Breast cancer Cousin     Social History:  reports that she has never smoked. She has never used smokeless tobacco. She reports that she does not drink alcohol and does not use drugs.  ROS:                                        Physical Exam: Ht '5\' 2"'$  (1.575 m)   LMP 02/21/1966   BMI 24.62 kg/m   Constitutional:  Alert and oriented, No acute  distress.  Laboratory Data: Lab Results  Component Value Date   WBC 3.7 (L) 09/17/2021   HGB 12.3 09/17/2021   HCT 37.6 09/17/2021   MCV 91.6 09/17/2021   PLT 176.0 09/17/2021    Lab Results  Component Value Date   CREATININE 1.36 (H) 10/01/2021    No results found for: "PSA"  No results found for: "TESTOSTERONE"  Lab Results  Component Value Date   HGBA1C 6.3 09/17/2021    Urinalysis    Component Value Date/Time   COLORURINE YELLOW (A) 06/28/2019 0813   APPEARANCEUR CLOUDY (A) 06/28/2019 0813   LABSPEC 1.041 (H) 06/28/2019 0813   PHURINE 6.0 06/28/2019 0813   GLUCOSEU NEGATIVE 06/28/2019 0813   GLUCOSEU NEGATIVE 02/20/2015 0945   HGBUR NEGATIVE 06/28/2019 0813   BILIRUBINUR NEGATIVE 06/28/2019 0813   KETONESUR 5 (A) 06/28/2019 0813   PROTEINUR NEGATIVE 06/28/2019 0813   UROBILINOGEN 0.2 02/20/2015 0945   NITRITE NEGATIVE 06/28/2019 0813   LEUKOCYTESUR LARGE (A) 06/28/2019 0813    Pertinent Imaging: Culture negative in 2021  Assessment & Plan: Reassess in 6 weeks on Myrbetriq 50 mg samples and prescription for pelvic examination cystoscopy and proceed accordingly.  She has frequency and nocturia.  She could not leave a sample today but if she does we will send for culture  1. Urge incontinence of urine  - Urinalysis, Complete   No follow-ups on file.  Reece Packer, MD  [facility] [address], [place], [place] [address] [phone]

## 2021-10-19 NOTE — Progress Notes (Unsigned)
Cardiology Office Note  Date:  10/20/2021   ID:  Kathy Dominguez, DOB [birth year], MRN [id number]  PCP:  Einar Pheasant, MD   Chief Complaint  Patient presents with   6 month follow up     "Doing well." Medications reviewed by the patient verbally.     HPI:  88 y/o ? with a h/o  HTN,   [facility] 01/28/2016 for shortness of breath,  diagnosed with acute systolic CHF , nonischemic 01/2016 ejection fraction 25-35%,  myoview showing no ischemia,  echo EF 50 to 55% 09/2016 who presents for follow-up  of her chronic systolic CHF   Last seen in clinic by myself March 2021 Seen by one of our providers March 2023  In follow-up today she reports that she is doing well Takes lasix QOD Sometimes gets some leg cramps over night "goes to the bathroom all the time" Followed by urology Occasionally with accidents Started on Myrbetriq 50 mg daily, has not started yet  Weight stable, denies significant leg swelling abdominal distention, no PND orthopnea  Tried statin per primary care, had side effects, had to stop the medication  CT scan 2108: mild to moderate PAD, aortic athero, particularly in iliac vessels  Labs reviewed HBA1C 6.3 Total chol 201, LDL 110  EKG personally reviewed by myself on todays visit Shows normal sinus rhythm with rate 67 bpm left axis deviation, no significant ST or T wave changes   PMH:   has a past medical history of Asthma, Chronic systolic CHF (congestive heart failure) ([place]), Colon cancer ([place]) (2005), Hypercholesterolemia, Hypertension, Hypertensive heart disease with CHF (congestive heart failure) ([place]), NICM (nonischemic cardiomyopathy) ([place]), NSVT (nonsustained ventricular tachycardia) ([place]), and Osteoporosis.  PSH:    Past Surgical History:  Procedure Laterality Date   ABDOMINAL HYSTERECTOMY     BREAST BIOPSY Left 4/15`   BENIGN BREAST EPITHELIUM WITH NODULAR FIBROSIS.   INGUINAL HERNIA REPAIR Left 05/04/2016   Procedure: HERNIA REPAIR INGUINAL  ADULT;  Surgeon: Robert Bellow, MD;  Location: [facility] ORS;  Service: General;  Laterality: Left;   THYROIDECTOMY, PARTIAL  1956   TUBAL LIGATION      Current Outpatient Medications  Medication Sig Dispense Refill   acetaminophen (TYLENOL) 325 MG tablet Take 650 mg by mouth every 6 (six) hours as needed.     albuterol (VENTOLIN HFA) 108 (90 Base) MCG/ACT inhaler Inhale 2 puffs into the lungs every 6 (six) hours as needed for wheezing or shortness of breath. 8.5 g 0   aspirin 81 MG EC tablet Take 81 mg by mouth daily. Swallow whole.     Cholecalciferol (VITAMIN D-3) 1000 UNITS CAPS Take 2,000 Units by mouth daily.     fluticasone (FLONASE) 50 MCG/ACT nasal spray Place 1 spray into both nostrils daily. 16 g 2   fluticasone furoate-vilanterol (BREO ELLIPTA) 100-25 MCG/ACT AEPB Inhale 1 puff into the lungs daily. 30 each 5   furosemide (LASIX) 40 MG tablet Take 1/2 tablet-1 tablet as directed per weight gain. 90 tablet 2   isosorbide mononitrate (IMDUR) 30 MG 24 hr tablet Take 1 tablet (30 mg total) by mouth daily. 30 tablet 5   metoprolol succinate (TOPROL-XL) 25 MG 24 hr tablet Take 1 tablet (25 mg total) by mouth daily. 30 tablet 3   mirabegron ER (MYRBETRIQ) 50 MG TB24 tablet Take 1 tablet (50 mg total) by mouth daily. 30 tablet 0   sacubitril-valsartan (ENTRESTO) 97-103 MG Take 1 tablet by mouth 2 (two) times daily. 180 tablet 3  senna-docusate (SENOKOT-S) 8.6-50 MG tablet Take 2 tablets by mouth 2 (two) times daily as needed for mild constipation. 60 tablet 0   spironolactone (ALDACTONE) 25 MG tablet Take 1 tablet (25 mg total) by mouth daily. 90 tablet 0   mirabegron ER (MYRBETRIQ) 50 MG TB24 tablet Take 1 tablet (50 mg total) by mouth daily. (Patient not taking: Reported on 10/20/2021) 30 tablet 0   No current facility-administered medications for this visit.    Allergies:   Patient has no known allergies.   Social History:  The patient  reports that she has never smoked. She has  never used smokeless tobacco. She reports that she does not drink alcohol and does not use drugs.   Family History:   family history includes Breast cancer in her cousin; Breast cancer (age of onset: 17) in her daughter; Colon cancer in her father; Stroke in her mother.   Review of Systems: Review of Systems  Constitutional: Negative.   Respiratory: Negative.    Cardiovascular: Negative.   Gastrointestinal: Negative.   Musculoskeletal:  Positive for joint pain.  Neurological: Negative.   Psychiatric/Behavioral: Negative.    All other systems reviewed and are negative.   PHYSICAL EXAM: VS:  BP 120/78 (BP Location: Left Arm, Patient Position: Sitting, Cuff Size: Normal)   Pulse 67   Ht '5\' 2"'$  (1.575 m)   Wt 134 lb 2 oz (60.8 kg)   LMP 02/21/1966   SpO2 95%   BMI 24.53 kg/m  , BMI Body mass index is 24.53 kg/m. Constitutional:  oriented to person, place, and time. No distress.  HENT:  Head: Grossly normal Eyes:  no discharge. No scleral icterus.  Neck: No JVD, no carotid bruits  Cardiovascular: Regular rate and rhythm, no murmurs appreciated Pulmonary/Chest: Clear to auscultation bilaterally, no wheezes or rails Abdominal: Soft.  no distension.  no tenderness.  Musculoskeletal: Normal range of motion Neurological:  normal muscle tone. Coordination normal. No atrophy Skin: Skin warm and dry Psychiatric: normal affect, pleasant  Recent Labs: 04/20/2021: TSH 0.78 09/17/2021: ALT 7; Hemoglobin 12.3; Platelets 176.0 10/01/2021: BUN 33; Creatinine, Ser 1.36; Potassium 4.2; Sodium 141   Lipid Panel Lab Results  Component Value Date   CHOL 229 (H) 09/17/2021   HDL 80.80 09/17/2021   LDLCALC 136 (H) 09/17/2021   TRIG 58.0 09/17/2021      Wt Readings from Last 3 Encounters:  10/20/21 134 lb 2 oz (60.8 kg)  10/19/21 133 lb 9.6 oz (60.6 kg)  09/24/21 134 lb 9.6 oz (61.1 kg)     ASSESSMENT AND PLAN:  Chronic systolic heart failure (HCC) Appears euvolemic She will continue  Lasix every other day She may need to increase her fluid intake slightly given mild worsening of renal function, leg cramps at night.  Was discussed with her . she preferred to increase fluid intake versus cut back on her Lasix Getting Entresto through the company  Essential hypertension Blood pressure is well controlled on today's visit. No changes made to the medications.  Hypercholesterolemia Not on a statin, was having statin myalgias on Crestor  PAD/aortic atherosclerosis Mild to moderate in nature seen on CT scan 2018,   most notably in the distal aorta, iliac vessels Statin intolerance  Joint pain  stable   Total encounter time more than 30 minutes  Greater than 50% was spent in counseling and coordination of care with the patient    Orders Placed This Encounter  Procedures   EKG 12-Lead     Signed, Tim  Rockey Situ, M.D., Ph.D. 10/20/2021  [place], [place]

## 2021-10-19 NOTE — Addendum Note (Signed)
Addended by: Tyrone Apple on: 10/19/2021 03:49 PM   Modules accepted: Orders

## 2021-10-20 ENCOUNTER — Other Ambulatory Visit: Payer: Self-pay | Admitting: Cardiovascular Disease

## 2021-10-20 ENCOUNTER — Encounter: Payer: Self-pay | Admitting: Cardiovascular Disease

## 2021-10-20 ENCOUNTER — Ambulatory Visit: Payer: Medicare HMO | Attending: Cardiovascular Disease | Admitting: Cardiovascular Disease

## 2021-10-20 ENCOUNTER — Other Ambulatory Visit: Payer: Medicare HMO

## 2021-10-20 VITALS — BP 120/78 | HR 67 | Ht 62.0 in | Wt 134.1 lb

## 2021-10-20 DIAGNOSIS — I428 Other cardiomyopathies: Secondary | ICD-10-CM

## 2021-10-20 DIAGNOSIS — M791 Myalgia, unspecified site: Secondary | ICD-10-CM

## 2021-10-20 DIAGNOSIS — I5022 Chronic systolic (congestive) heart failure: Secondary | ICD-10-CM

## 2021-10-20 DIAGNOSIS — I110 Hypertensive heart disease with heart failure: Secondary | ICD-10-CM

## 2021-10-20 DIAGNOSIS — I700 Atherosclerosis of aorta: Secondary | ICD-10-CM

## 2021-10-20 DIAGNOSIS — T466X5A Adverse effect of antihyperlipidemic and antiarteriosclerotic drugs, initial encounter: Secondary | ICD-10-CM

## 2021-10-20 DIAGNOSIS — E785 Hyperlipidemia, unspecified: Secondary | ICD-10-CM | POA: Diagnosis not present

## 2021-10-20 DIAGNOSIS — I10 Essential (primary) hypertension: Secondary | ICD-10-CM | POA: Diagnosis not present

## 2021-10-20 DIAGNOSIS — I5042 Chronic combined systolic (congestive) and diastolic (congestive) heart failure: Secondary | ICD-10-CM

## 2021-10-20 DIAGNOSIS — I739 Peripheral vascular disease, unspecified: Secondary | ICD-10-CM

## 2021-10-20 DIAGNOSIS — N3941 Urge incontinence: Secondary | ICD-10-CM

## 2021-10-20 DIAGNOSIS — N3946 Mixed incontinence: Secondary | ICD-10-CM

## 2021-10-20 LAB — MICROSCOPIC EXAMINATION

## 2021-10-20 LAB — URINALYSIS, COMPLETE
Bilirubin, UA: NEGATIVE
Glucose, UA: NEGATIVE
Ketones, UA: NEGATIVE
Nitrite, UA: NEGATIVE
Protein,UA: NEGATIVE
RBC, UA: NEGATIVE
Specific Gravity, UA: 1.015 (ref 1.005–1.030)
Urobilinogen, Ur: 0.2 mg/dL (ref 0.2–1.0)
pH, UA: 5.5 (ref 5.0–7.5)

## 2021-10-20 MED ORDER — SPIRONOLACTONE 25 MG PO TABS: 25.0000 mg | ORAL_TABLET | Freq: Every day | ORAL | 1 refills | Status: DC

## 2021-10-20 MED ORDER — FUROSEMIDE 40 MG PO TABS: ORAL_TABLET | ORAL | 1 refills | Status: DC

## 2021-10-20 MED ORDER — ISOSORBIDE MONONITRATE ER 30 MG PO TB24: 30.0000 mg | ORAL_TABLET | Freq: Every day | ORAL | 1 refills | Status: DC

## 2021-10-20 MED ORDER — ENTRESTO 97-103 MG PO TABS: 1.0000 | ORAL_TABLET | Freq: Two times a day (BID) | ORAL | 1 refills | Status: DC

## 2021-10-20 MED ORDER — METOPROLOL SUCCINATE ER 25 MG PO TB24: 25.0000 mg | ORAL_TABLET | Freq: Every day | ORAL | 1 refills | Status: DC

## 2021-10-20 NOTE — Patient Instructions (Addendum)
Drink a little bit more fluid  Medication Instructions:  No changes  If you need a refill on your cardiac medications before your next appointment, please call your pharmacy.   Lab work: No new labs needed  Testing/Procedures: No new testing needed  Follow-Up: At [facility], you and your health needs are our priority.  As part of our continuing mission to provide you with exceptional heart care, we have created designated Provider Care Teams.  These Care Teams include your primary Cardiologist (physician) and Advanced Practice Providers (APPs -  Physician Assistants and Nurse Practitioners) who all work together to provide you with the care you need, when you need it.  You will need a follow up appointment in 6 months  Providers on your designated Care Team:   Murray Hodgkins, NP Christell Faith, PA-C Cadence Kathlen Mody, [place]  COVID-19 Vaccine Information can be found at: [email or web address] For questions related to vaccine distribution or appointments, please email [email or web address] or call [phone].

## 2021-10-23 LAB — CULTURE, URINE COMPREHENSIVE

## 2021-10-28 ENCOUNTER — Other Ambulatory Visit: Payer: Self-pay | Admitting: Internal Medicine

## 2021-10-28 DIAGNOSIS — Z1231 Encounter for screening mammogram for malignant neoplasm of breast: Secondary | ICD-10-CM

## 2021-11-05 ENCOUNTER — Other Ambulatory Visit: Payer: Self-pay | Admitting: Cardiovascular Disease

## 2021-11-09 ENCOUNTER — Telehealth: Payer: Self-pay | Admitting: Internal Medicine

## 2021-11-09 NOTE — Telephone Encounter (Signed)
Pt called wanting to know was it ok to take the RSV vaccine

## 2021-11-09 NOTE — Telephone Encounter (Signed)
Called. Pt got RSV vaccine today.  Questions answered.

## 2021-11-24 ENCOUNTER — Encounter: Payer: Self-pay | Admitting: Internal Medicine

## 2021-11-24 ENCOUNTER — Ambulatory Visit: Payer: Medicare HMO | Admitting: Internal Medicine

## 2021-11-24 VITALS — BP 140/62 | HR 74 | Temp 98.6°F | Resp 16 | Ht 62.0 in | Wt 139.0 lb

## 2021-11-24 DIAGNOSIS — I10 Essential (primary) hypertension: Secondary | ICD-10-CM | POA: Diagnosis not present

## 2021-11-24 DIAGNOSIS — J4520 Mild intermittent asthma, uncomplicated: Secondary | ICD-10-CM

## 2021-11-24 DIAGNOSIS — I700 Atherosclerosis of aorta: Secondary | ICD-10-CM

## 2021-11-24 DIAGNOSIS — D72819 Decreased white blood cell count, unspecified: Secondary | ICD-10-CM

## 2021-11-24 DIAGNOSIS — I5022 Chronic systolic (congestive) heart failure: Secondary | ICD-10-CM

## 2021-11-24 DIAGNOSIS — I5042 Chronic combined systolic (congestive) and diastolic (congestive) heart failure: Secondary | ICD-10-CM

## 2021-11-24 DIAGNOSIS — E7800 Pure hypercholesterolemia, unspecified: Secondary | ICD-10-CM

## 2021-11-24 DIAGNOSIS — I4729 Other ventricular tachycardia: Secondary | ICD-10-CM

## 2021-11-24 DIAGNOSIS — N1832 Chronic kidney disease, stage 3b: Secondary | ICD-10-CM

## 2021-11-24 DIAGNOSIS — R739 Hyperglycemia, unspecified: Secondary | ICD-10-CM | POA: Diagnosis not present

## 2021-11-24 DIAGNOSIS — I428 Other cardiomyopathies: Secondary | ICD-10-CM

## 2021-11-24 DIAGNOSIS — I110 Hypertensive heart disease with heart failure: Secondary | ICD-10-CM

## 2021-11-24 DIAGNOSIS — Z85038 Personal history of other malignant neoplasm of large intestine: Secondary | ICD-10-CM

## 2021-11-24 LAB — CBC WITH DIFFERENTIAL/PLATELET
Basophils Absolute: 0 10*3/uL (ref 0.0–0.1)
Basophils Relative: 0.4 % (ref 0.0–3.0)
Eosinophils Absolute: 0 10*3/uL (ref 0.0–0.7)
Eosinophils Relative: 0.1 % (ref 0.0–5.0)
HCT: 37.5 % (ref 36.0–46.0)
Hemoglobin: 12.3 g/dL (ref 12.0–15.0)
Lymphocytes Relative: 31.6 % (ref 12.0–46.0)
Lymphs Abs: 1.4 10*3/uL (ref 0.7–4.0)
MCHC: 32.8 g/dL (ref 30.0–36.0)
MCV: 91.6 fl (ref 78.0–100.0)
Monocytes Absolute: 0.4 10*3/uL (ref 0.1–1.0)
Monocytes Relative: 7.9 % (ref 3.0–12.0)
Neutro Abs: 2.7 10*3/uL (ref 1.4–7.7)
Neutrophils Relative %: 60 % (ref 43.0–77.0)
Platelets: 221 10*3/uL (ref 150.0–400.0)
RBC: 4.1 Mil/uL (ref 3.87–5.11)
RDW: 13.4 % (ref 11.5–15.5)
WBC: 4.5 10*3/uL (ref 4.0–10.5)

## 2021-11-24 LAB — BASIC METABOLIC PANEL
BUN: 27 mg/dL — ABNORMAL HIGH (ref 6–23)
CO2: 32 mEq/L (ref 19–32)
Calcium: 9.6 mg/dL (ref 8.4–10.5)
Chloride: 102 mEq/L (ref 96–112)
Creatinine, Ser: 1.19 mg/dL (ref 0.40–1.20)
GFR: 39.92 mL/min — ABNORMAL LOW (ref >60.00)
Glucose, Bld: 93 mg/dL (ref 70–99)
Potassium: 4.3 mEq/L (ref 3.5–5.1)
Sodium: 141 mEq/L (ref 135–145)

## 2021-11-24 NOTE — Progress Notes (Signed)
[name], female   DOB: 1928-04-13, 88 y.o.   MRN: [id number]   Subjective:    [name], female    DOB: 07-05-1928, 88 y.o.   MRN: [id number]   Patient here for  Chief Complaint  Patient presents with   Follow-up    88mo   .   HPI Here to follow up regarding CHF, hypertension and hypercholesterolemia.  Saw Dr SHalford Chessman- 09/24/21 - continue breo and prn albuterol.  [place] - knee pain. Stable. Saw Dr MMatilde Sprang- urge incontinence - started on myrbetriq.  Saw Dr GRockey Situ9/26/23 - lasix qod and entrestro.  Feels breathing is stable.  No chest pain.  Eating.  No nausea or vomiting.  No bowel change reported.     Past Medical History:  Diagnosis Date   Asthma    Chronic systolic CHF (congestive heart failure) ([place]    a. 01/2015 Echo: EF 25-30%, sev diff HK, mild to mod MR, mildly dil LA, nl RV fxn, PASP 61 mmHg.   Colon cancer ([place] 2005   Hypercholesterolemia    Hypertension    Hypertensive heart disease with CHF (congestive heart failure) ([place]    NICM (nonischemic cardiomyopathy) ([place]    a. 01/2015 Echo: EF 25-30% sev diff HK;  b. 01/2015 Lexi MV: EF 19%, small defect of mild severity in apex - likely breast attenuation, no ischemia.   NSVT (nonsustained ventricular tachycardia) (HCC)    Osteoporosis    Past Surgical History:  Procedure Laterality Date   ABDOMINAL HYSTERECTOMY     BREAST BIOPSY Left 4/15`   BENIGN BREAST EPITHELIUM WITH NODULAR FIBROSIS.   INGUINAL HERNIA REPAIR Left 05/04/2016   Procedure: HERNIA REPAIR INGUINAL ADULT;  Surgeon: JRobert Bellow MD;  Location: [facility] ORS;  Service: General;  Laterality: Left;   THYROIDECTOMY, PARTIAL  1956   TUBAL LIGATION     Family History  Problem Relation Age of Onset   Stroke Mother    Colon cancer Father    Breast cancer Daughter 382  Breast cancer Cousin    Social History   Socioeconomic History   Marital status: Widowed    Spouse name: Not on file   Number of children:  Not on file   Years of education: Not on file   Highest education level: Not on file  Occupational History   Not on file  Tobacco Use   Smoking status: Never   Smokeless tobacco: Never  Vaping Use   Vaping Use: Never used  Substance and Sexual Activity   Alcohol use: No    Alcohol/week: 0.0 standard drinks of alcohol   Drug use: No   Sexual activity: Not on file  Other Topics Concern   Not on file  Social History Narrative   Not on file   Social Determinants of Health   Financial Resource Strain: Low Risk  (11/27/2021)   Overall Financial Resource Strain (CARDIA)    Difficulty of Paying Living Expenses: Not hard at all  Food Insecurity: No Food Insecurity (11/27/2021)   Hunger Vital Sign    Worried About Running Out of Food in the Last Year: Never true    [place] the Last [place]: Never true  Transportation Needs: No Transportation Needs (11/27/2021)   PRAPARE - THydrologist(Medical): No    Lack of Transportation (Non-Medical): No  Physical Activity: Insufficiently Active (11/27/2021)   Exercise Vital Sign  Days of Exercise per Week: 7 days    Minutes of Exercise per Session: 20 min  Stress: No Stress Concern Present (11/27/2021)   [place]    Feeling of Stress : Not at all  Social Connections: Unknown (11/27/2021)   Social Connection and Isolation Panel [NHANES]    Frequency of Communication with Friends and Family: More than three times a week    Frequency of Social Gatherings with Friends and Family: Once a week    Attends Religious Services: Not on Diplomatic Services operational officer of Clubs or Organizations: No    Attends Archivist Meetings: Not on file    Marital Status: Widowed     Review of Systems  Constitutional:  Negative for appetite change and unexpected weight change.  HENT:  Negative for congestion and sinus pressure.   Respiratory:  Negative for  cough and chest tightness.        Breathing stable.   Cardiovascular:  Negative for chest pain and palpitations.       No increased swelling.   Gastrointestinal:  Negative for abdominal pain, diarrhea, nausea and vomiting.  Genitourinary:  Negative for difficulty urinating and dysuria.  Musculoskeletal:  Negative for joint swelling and myalgias.  Skin:  Negative for color change and rash.  Neurological:  Negative for dizziness and headaches.  Psychiatric/Behavioral:  Negative for agitation and dysphoric mood.        Objective:     BP (!) 140/62 (BP Location: Left Arm, Patient Position: Sitting, Cuff Size: Small)   Pulse 74   Temp 98.6 F (37 C) (Temporal)   Resp 16   Ht _0  (1.575 m)   Wt 139 lb (63 kg)   LMP 02/21/1966   SpO2 96%   BMI 25.42 kg/m  Wt Readings from Last 3 Encounters:  11/27/21 139 lb (63 kg)  11/24/21 139 lb (63 kg)  10/20/21 134 lb 2 oz (60.8 kg)    Physical Exam Vitals reviewed.  Constitutional:      General: She is not in acute distress.    Appearance: Normal appearance.  HENT:     Head: Normocephalic and atraumatic.     Right Ear: External ear normal.     Left Ear: External ear normal.  Eyes:     General: No scleral icterus.       Right eye: No discharge.        Left eye: No discharge.     Conjunctiva/sclera: Conjunctivae normal.  Neck:     Thyroid: No thyromegaly.  Cardiovascular:     Rate and Rhythm: Normal rate and regular rhythm.  Pulmonary:     Effort: No respiratory distress.     Breath sounds: Normal breath sounds. No wheezing.  Abdominal:     General: Bowel sounds are normal.     Palpations: Abdomen is soft.     Tenderness: There is no abdominal tenderness.  Musculoskeletal:        General: No swelling or tenderness.     Cervical back: Neck supple. No tenderness.  Lymphadenopathy:     Cervical: No cervical adenopathy.  Skin:    Findings: No erythema or rash.  Neurological:     Mental Status: She is alert.  Psychiatric:         Mood and Affect: Mood normal.        Behavior: Behavior normal.      Outpatient Encounter Medications as of 11/24/2021  Medication  Sig   acetaminophen (TYLENOL) 325 MG tablet Take 650 mg by mouth every 6 (six) hours as needed.   albuterol (VENTOLIN HFA) 108 (90 Base) MCG/ACT inhaler Inhale 2 puffs into the lungs every 6 (six) hours as needed for wheezing or shortness of breath.   aspirin 81 MG EC tablet Take 81 mg by mouth daily. Swallow whole.   Cholecalciferol (VITAMIN D-3) 1000 UNITS CAPS Take 2,000 Units by mouth daily.   fluticasone (FLONASE) 50 MCG/ACT nasal spray Place 1 spray into both nostrils daily.   fluticasone furoate-vilanterol (BREO ELLIPTA) 100-25 MCG/ACT AEPB Inhale 1 puff into the lungs daily.   furosemide (LASIX) 40 MG tablet Take 1/2-1 tablet as directed per weight gain   isosorbide mononitrate (IMDUR) 30 MG 24 hr tablet Take 1 tablet (30 mg total) by mouth daily.   metoprolol succinate (TOPROL-XL) 25 MG 24 hr tablet Take 1 tablet (25 mg total) by mouth daily.   sacubitril-valsartan (ENTRESTO) 97-103 MG Take 1 tablet by mouth 2 (two) times daily.   senna-docusate (SENOKOT-S) 8.6-50 MG tablet Take 2 tablets by mouth 2 (two) times daily as needed for mild constipation.   spironolactone (ALDACTONE) 25 MG tablet Take 1 tablet (25 mg total) by mouth daily.   mirabegron ER (MYRBETRIQ) 50 MG TB24 tablet Take 1 tablet (50 mg total) by mouth daily.   [DISCONTINUED] mirabegron ER (MYRBETRIQ) 50 MG TB24 tablet Take 1 tablet (50 mg total) by mouth daily. (Patient not taking: Reported on 10/20/2021)   No facility-administered encounter medications on file as of 11/24/2021.     Lab Results  Component Value Date   WBC 4.5 11/24/2021   HGB 12.3 11/24/2021   HCT 37.5 11/24/2021   PLT 221.0 11/24/2021   GLUCOSE 93 11/24/2021   CHOL 229 (H) 09/17/2021   TRIG 58.0 09/17/2021   HDL 80.80 09/17/2021   LDLDIRECT 128.3 02/08/2013   LDLCALC 136 (H) 09/17/2021   ALT 7 09/17/2021    AST 14 09/17/2021   NA 141 11/24/2021   K 4.3 11/24/2021   CL 102 11/24/2021   CREATININE 1.19 11/24/2021   BUN 27 (H) 11/24/2021   CO2 32 11/24/2021   TSH 0.78 04/20/2021   INR 0.9 06/28/2019   HGBA1C 6.3 09/17/2021    MM 3D SCREEN BREAST BILATERAL  Result Date: 11/27/2020 CLINICAL DATA:  Screening. EXAM: DIGITAL SCREENING BILATERAL MAMMOGRAM WITH TOMOSYNTHESIS AND CAD TECHNIQUE: Bilateral screening digital craniocaudal and mediolateral oblique mammograms were obtained. Bilateral screening digital breast tomosynthesis was performed. The images were evaluated with computer-aided detection. COMPARISON:  Previous exam(s). ACR Breast Density Category c: The breast tissue is heterogeneously dense, which may obscure small masses. FINDINGS: There are no findings suspicious for malignancy. IMPRESSION: No mammographic evidence of malignancy. A result letter of this screening mammogram will be mailed directly to the patient. RECOMMENDATION: Screening mammogram in one year. (Code:SM-B-01Y) BI-RADS CATEGORY  1: Negative. Electronically Signed   By: Ammie Ferrier M.D.   On: 11/27/2020 13:22      Assessment & Plan:   Problem List Items Addressed This Visit     Aortic atherosclerosis ([place])    Was on crestor. Intolerance.  Off now.  Follow lipid panel.       Asthma    Breathing stable.  Follow.       Chronic systolic heart failure (HCC) (Chronic)    Taking metoprolol, spironolactone, entrestro and imdur.  Has lasix to take if needed.  Weight has been stable.  Follow.  CKD (chronic kidney disease), stage III (HCC)    Avoid antiinflammatories.  Follow metabolic panel.       Essential hypertension - Primary    Continue aldactone, entresto, metoprolol and lasix.  Follow pressures.       Relevant Orders   Basic Metabolic Panel (BMET)   Basic metabolic panel (Completed)   History of colon cancer    Recently evaluated by oncology for increase CEA.  PET ok.  Follow.        Hypercholesterolemia    Was on crestor. Intolerance.  Off now.  Follow lipid panel.       Relevant Orders   Hepatic function panel   Lipid Profile   Hyperglycemia    Follow met b and a1c.       Relevant Orders   HgB A1c   Hypertensive heart disease with CHF (congestive heart failure) (HCC)    No evidence of volume overload on exam.  Continue entrestro, aldactone, lasix and metoprolol.  Follow.       Leukopenia    Follow cbc.       Relevant Orders   CBC with Differential/Platelet (Completed)   NICM (nonischemic cardiomyopathy) (HCC)    Taking metoprolol, spironolactone, entrestro and imdur.  Has lasix to take if needed.  Weight has been stable.  Follow.       NSVT (nonsustained ventricular tachycardia) ([place])    Documented per cardiology. On metoprolol. Stable.         Einar Pheasant, MD

## 2021-11-27 ENCOUNTER — Ambulatory Visit (INDEPENDENT_AMBULATORY_CARE_PROVIDER_SITE_OTHER): Payer: Medicare HMO

## 2021-11-27 VITALS — Ht 62.0 in | Wt 139.0 lb

## 2021-11-27 DIAGNOSIS — Z0000 Encounter for general adult medical examination without abnormal findings: Secondary | ICD-10-CM | POA: Diagnosis not present

## 2021-11-27 NOTE — Patient Instructions (Addendum)
[name] , Thank you for taking time to come for your Medicare Wellness Visit. I appreciate your ongoing commitment to your health goals. Please review the following plan we discussed and let me know if I can assist you in the future.   These are the goals we discussed:  Goals      Follow up with Primary Care Provider     As needed     Maintain Healthy lifestyle     Stay active Healthy diet        This is a list of the screening recommended for you and due dates:  Health Maintenance  Topic Date Due   DEXA scan (bone density measurement)  Never done   Mammogram  11/27/2021   COVID-19 Vaccine (5 - Pfizer series) 12/10/2021*   Pneumonia Vaccine (2 - PPSV23 or PCV20) 01/26/2022*   Tetanus Vaccine  06/12/2024*   Medicare Annual Wellness Visit  11/28/2022   Flu Shot  Completed   Zoster (Shingles) Vaccine  Completed   HPV Vaccine  Aged Out  *Topic was postponed. The date shown is not the original due date.    Advanced directives: End of life planning; Advance aging; Advanced directives discussed.  Declined at this time.   Conditions/risks identified: none new  Next appointment: Follow up in one year for your annual wellness visit    Preventive Care 65 Years and Older, Female Preventive care refers to lifestyle choices and visits with your health care provider that can promote health and wellness. What does preventive care include? A yearly physical exam. This is also called an annual well check. Dental exams once or twice a year. Routine eye exams. Ask your health care provider how often you should have your eyes checked. Personal lifestyle choices, including: Daily care of your teeth and gums. Regular physical activity. Eating a healthy diet. Avoiding tobacco and drug use. Limiting alcohol use. Practicing safe sex. Taking low-dose aspirin every day. Taking vitamin and mineral supplements as recommended by your health care provider. What happens during an annual well  check? The services and screenings done by your health care provider during your annual well check will depend on your age, overall health, lifestyle risk factors, and family history of disease. Counseling  Your health care provider may ask you questions about your: Alcohol use. Tobacco use. Drug use. Emotional well-being. Home and relationship well-being. Sexual activity. Eating habits. History of falls. Memory and ability to understand (cognition). Work and work Statistician. Reproductive health. Screening  You may have the following tests or measurements: Height, weight, and BMI. Blood pressure. Lipid and cholesterol levels. These may be checked every 5 years, or more frequently if you are over 9 years old. Skin check. Lung cancer screening. You may have this screening every year starting at age 64 if you have a 30-pack-year history of smoking and currently smoke or have quit within the past 15 years. Fecal occult blood test (FOBT) of the stool. You may have this test every year starting at age 34. Flexible sigmoidoscopy or colonoscopy. You may have a sigmoidoscopy every 5 years or a colonoscopy every 10 years starting at age 7. Hepatitis C blood test. Hepatitis B blood test. Sexually transmitted disease (STD) testing. Diabetes screening. This is done by checking your blood sugar (glucose) after you have not eaten for a while (fasting). You may have this done every 1-3 years. Bone density scan. This is done to screen for osteoporosis. You may have this done starting at age 75.  Mammogram. This may be done every 1-2 years. Talk to your health care provider about how often you should have regular mammograms. Talk with your health care provider about your test results, treatment options, and if necessary, the need for more tests. Vaccines  Your health care provider may recommend certain vaccines, such as: Influenza vaccine. This is recommended every year. Tetanus, diphtheria, and  acellular pertussis (Tdap, Td) vaccine. You may need a Td booster every 10 years. Zoster vaccine. You may need this after age 54. Pneumococcal 13-valent conjugate (PCV13) vaccine. One dose is recommended after age 45. Pneumococcal polysaccharide (PPSV23) vaccine. One dose is recommended after age 23. Talk to your health care provider about which screenings and vaccines you need and how often you need them. This information is not intended to replace advice given to you by your health care provider. Make sure you discuss any questions you have with your health care provider. Document Released: 02/07/2015 Document Revised: 10/01/2015 Document Reviewed: 11/12/2014 Elsevier Interactive Patient Education  2017 [place] Prevention in the Home Falls can cause injuries. They can happen to people of all ages. There are many things you can do to make your home safe and to help prevent falls. What can I do on the outside of my home? Regularly fix the edges of walkways and driveways and fix any cracks. Remove anything that might make you trip as you walk through a door, such as a raised step or threshold. Trim any bushes or trees on the path to your home. Use bright outdoor lighting. Clear any walking paths of anything that might make someone trip, such as rocks or tools. Regularly check to see if handrails are loose or broken. Make sure that both sides of any steps have handrails. Any raised decks and porches should have guardrails on the edges. Have any leaves, snow, or ice cleared regularly. Use sand or salt on walking paths during winter. Clean up any spills in your garage right away. This includes oil or grease spills. What can I do in the bathroom? Use night lights. Install grab bars by the toilet and in the tub and shower. Do not use towel bars as grab bars. Use non-skid mats or decals in the tub or shower. If you need to sit down in the shower, use a plastic, non-slip stool. Keep  the floor dry. Clean up any water that spills on the floor as soon as it happens. Remove soap buildup in the tub or shower regularly. Attach bath mats securely with double-sided non-slip rug tape. Do not have throw rugs and other things on the floor that can make you trip. What can I do in the bedroom? Use night lights. Make sure that you have a light by your bed that is easy to reach. Do not use any sheets or blankets that are too big for your bed. They should not hang down onto the floor. Have a firm chair that has side arms. You can use this for support while you get dressed. Do not have throw rugs and other things on the floor that can make you trip. What can I do in the kitchen? Clean up any spills right away. Avoid walking on wet floors. Keep items that you use a lot in easy-to-reach places. If you need to reach something above you, use a strong step stool that has a grab bar. Keep electrical cords out of the way. Do not use floor polish or wax that makes floors slippery. If  you must use wax, use non-skid floor wax. Do not have throw rugs and other things on the floor that can make you trip. What can I do with my stairs? Do not leave any items on the stairs. Make sure that there are handrails on both sides of the stairs and use them. Fix handrails that are broken or loose. Make sure that handrails are as long as the stairways. Check any carpeting to make sure that it is firmly attached to the stairs. Fix any carpet that is loose or worn. Avoid having throw rugs at the top or bottom of the stairs. If you do have throw rugs, attach them to the floor with carpet tape. Make sure that you have a light switch at the top of the stairs and the bottom of the stairs. If you do not have them, ask someone to add them for you. What else can I do to help prevent falls? Wear shoes that: Do not have high heels. Have rubber bottoms. Are comfortable and fit you well. Are closed at the toe. Do not  wear sandals. If you use a stepladder: Make sure that it is fully opened. Do not climb a closed stepladder. Make sure that both sides of the stepladder are locked into place. Ask someone to hold it for you, if possible. Clearly mark and make sure that you can see: Any grab bars or handrails. First and last steps. Where the edge of each step is. Use tools that help you move around (mobility aids) if they are needed. These include: Canes. Walkers. Scooters. Crutches. Turn on the lights when you go into a dark area. Replace any light bulbs as soon as they burn out. Set up your furniture so you have a clear path. Avoid moving your furniture around. If any of your floors are uneven, fix them. If there are any pets around you, be aware of where they are. Review your medicines with your doctor. Some medicines can make you feel dizzy. This can increase your chance of falling. Ask your doctor what other things that you can do to help prevent falls. This information is not intended to replace advice given to you by your health care provider. Make sure you discuss any questions you have with your health care provider. Document Released: 11/07/2008 Document Revised: 06/19/2015 Document Reviewed: 02/15/2014 Elsevier Interactive Patient Education  2017 [employer].

## 2021-11-27 NOTE — Progress Notes (Signed)
Subjective:   Kathy Dominguez is a 88 y.o. female who presents for Medicare Annual (Subsequent) preventive examination.  Review of Systems    No ROS.  Medicare Wellness Virtual Visit.  Visual/audio telehealth visit, UTA vital signs.   See social history for additional risk factors.   Cardiac Risk Factors include: advanced age (>64mn, >>66women);hypertension     Objective:    Today's Vitals   11/27/21 0906  Weight: 139 lb (63 kg)  Height: '5\' 2"'$  (1.575 m)   Body mass index is 25.42 kg/m.     11/27/2021    9:18 AM 11/26/2020    9:35 AM 11/26/2019    9:23 AM 11/23/2019    2:36 PM 11/23/2019   12:11 PM 06/28/2019    4:58 PM 06/28/2019    3:19 AM  Advanced Directives  Does Patient Have a Medical Advance Directive? No No No No No No No  Would patient like information on creating a medical advance directive? No - Patient declined No - Patient declined No - Patient declined   No - Patient declined     Current Medications (verified) Outpatient Encounter Medications as of 11/27/2021  Medication Sig   acetaminophen (TYLENOL) 325 MG tablet Take 650 mg by mouth every 6 (six) hours as needed.   albuterol (VENTOLIN HFA) 108 (90 Base) MCG/ACT inhaler Inhale 2 puffs into the lungs every 6 (six) hours as needed for wheezing or shortness of breath.   aspirin 81 MG EC tablet Take 81 mg by mouth daily. Swallow whole.   Cholecalciferol (VITAMIN D-3) 1000 UNITS CAPS Take 2,000 Units by mouth daily.   fluticasone (FLONASE) 50 MCG/ACT nasal spray Place 1 spray into both nostrils daily.   fluticasone furoate-vilanterol (BREO ELLIPTA) 100-25 MCG/ACT AEPB Inhale 1 puff into the lungs daily.   furosemide (LASIX) 40 MG tablet Take 1/2-1 tablet as directed per weight gain   isosorbide mononitrate (IMDUR) 30 MG 24 hr tablet Take 1 tablet (30 mg total) by mouth daily.   metoprolol succinate (TOPROL-XL) 25 MG 24 hr tablet Take 1 tablet (25 mg total) by mouth daily.   mirabegron ER (MYRBETRIQ) 50 MG TB24  tablet Take 1 tablet (50 mg total) by mouth daily.   sacubitril-valsartan (ENTRESTO) 97-103 MG Take 1 tablet by mouth 2 (two) times daily.   senna-docusate (SENOKOT-S) 8.6-50 MG tablet Take 2 tablets by mouth 2 (two) times daily as needed for mild constipation.   spironolactone (ALDACTONE) 25 MG tablet Take 1 tablet (25 mg total) by mouth daily.   No facility-administered encounter medications on file as of 11/27/2021.    Allergies (verified) Patient has no known allergies.   History: Past Medical History:  Diagnosis Date   Asthma    Chronic systolic CHF (congestive heart failure) ([facility]    a. 01/2015 Echo: EF 25-30%, sev diff HK, mild to mod MR, mildly dil LA, nl RV fxn, PASP 61 mmHg.   Colon cancer ([facility] 2005   Hypercholesterolemia    Hypertension    Hypertensive heart disease with CHF (congestive heart failure) ([facility]    NICM (nonischemic cardiomyopathy) ([facility]    a. 01/2015 Echo: EF 25-30% sev diff HK;  b. 01/2015 Lexi MV: EF 19%, small defect of mild severity in apex - likely breast attenuation, no ischemia.   NSVT (nonsustained ventricular tachycardia) ([facility])    Osteoporosis    Past Surgical History:  Procedure Laterality Date   ABDOMINAL HYSTERECTOMY     BREAST BIOPSY Left 4/15`   BENIGN BREAST  EPITHELIUM WITH NODULAR FIBROSIS.   INGUINAL HERNIA REPAIR Left 05/04/2016   Procedure: HERNIA REPAIR INGUINAL ADULT;  Surgeon: Robert Bellow, MD;  Location: [facility] ORS;  Service: General;  Laterality: Left;   THYROIDECTOMY, PARTIAL  1956   TUBAL LIGATION     Family History  Problem Relation Age of Onset   Stroke Mother    Colon cancer Father    Breast cancer Daughter 19   Breast cancer Cousin    Social History   Socioeconomic History   Marital status: Widowed    Spouse name: Not on file   Number of children: Not on file   Years of education: Not on file   Highest education level: Not on file  Occupational History   Not on file  Tobacco Use   Smoking status: Never    Smokeless tobacco: Never  Vaping Use   Vaping Use: Never used  Substance and Sexual Activity   Alcohol use: No    Alcohol/week: 0.0 standard drinks of alcohol   Drug use: No   Sexual activity: Not on file  Other Topics Concern   Not on file  Social History Narrative   Not on file   Social Determinants of Health   Financial Resource Strain: Low Risk  (11/27/2021)   Overall Financial Resource Strain (CARDIA)    Difficulty of Paying Living Expenses: Not hard at all  Food Insecurity: No Food Insecurity (11/27/2021)   Hunger Vital Sign    Worried About Running Out of Food in the Last Year: Never true    [place] in the Last Year: Never true  Transportation Needs: No Transportation Needs (11/27/2021)   PRAPARE - Hydrologist (Medical): No    Lack of Transportation (Non-Medical): No  Physical Activity: Insufficiently Active (11/27/2021)   Exercise Vital Sign    Days of Exercise per Week: 7 days    Minutes of Exercise per Session: 20 min  Stress: No Stress Concern Present (11/27/2021)   [place]    Feeling of Stress : Not at all  Social Connections: Unknown (11/27/2021)   Social Connection and Isolation Panel [NHANES]    Frequency of Communication with Friends and Family: More than three times a week    Frequency of Social Gatherings with Friends and Family: Once a week    Attends Religious Services: Not on Diplomatic Services operational officer of Clubs or Organizations: No    Attends Archivist Meetings: Not on file    Marital Status: Widowed    Tobacco Counseling Counseling given: Not Answered   Clinical Intake:  Pre-visit preparation completed: Yes        Diabetes: No  How often do you need to have someone help you when you read instructions, pamphlets, or other written materials from your doctor or pharmacy?: 1 - Never    Interpreter Needed?: No      Activities of  Daily Living    11/27/2021    9:20 AM 11/26/2021   12:04 PM  In your present state of health, do you have any difficulty performing the following activities:  Hearing? 0 0  Vision? 0 0  Difficulty concentrating or making decisions? 0 0  Walking or climbing stairs? 1 1  Comment Paces self with walking/climbing stairs   Dressing or bathing? 0 0  Doing errands, shopping? 0 0  Preparing Food and eating ? N N  Using the Toilet?  N N  In the past six months, have you accidently leaked urine? N N  Comment Followed by Urology. Managed with medication. Wears Depend brief.   Do you have problems with loss of bowel control? N N  Managing your Medications? N N  Managing your Finances? N N  Housekeeping or managing your Housekeeping? N N    Patient Care Team: Einar Pheasant, MD as PCP - General (Internal Medicine) Minna Merritts, MD as PCP - Cardiology (Cardiology) Byrnett, Forest Gleason, MD (General Surgery) Einar Pheasant, MD (Internal Medicine) Alisa Graff, FNP as Nurse Practitioner (Family Medicine) Erby Pian, MD as Referring Physician (Pulmonary Disease) Minna Merritts, MD as Consulting Physician (Cardiology)  Indicate any recent Medical Services you may have received from other than [facility] providers in the past year (date may be approximate).     Assessment:   This is a routine wellness examination for [facility].  I connected with  Kathy Dominguez on 11/27/21 by a audio enabled telemedicine application and verified that I am speaking with the correct person using two identifiers.  Patient Location: Home  Provider Location: Office/Clinic  I discussed the limitations of evaluation and management by telemedicine. The patient expressed understanding and agreed to proceed.   Hearing/Vision screen Hearing Screening - Comments:: Hearing aids  Vision Screening - Comments:: Wears Glasses. Followed by [facility]  Dietary issues and exercise activities  discussed: Current Exercise Habits: Home exercise routine, Type of exercise: stretching;walking, Time (Minutes): 20, Frequency (Times/Week): 7, Weekly Exercise (Minutes/Week): 140, Intensity: Mild   Goals Addressed             This Visit's Progress    Maintain Healthy lifestyle       Stay active Healthy diet       Depression Screen    11/27/2021    9:03 AM 11/24/2021    9:01 AM 06/24/2021   11:31 AM 04/20/2021    9:05 AM 11/26/2020    9:13 AM 04/16/2020   10:29 AM 11/26/2019    9:15 AM  PHQ 2/9 Scores  PHQ - 2 Score 0 0 0 0 0 0 0    Fall Risk    11/27/2021    9:14 AM 11/26/2021   12:04 PM 11/24/2021    9:00 AM 06/24/2021   11:31 AM 04/20/2021    9:05 AM  Fall Risk   Falls in the past year? '1 1 1 1 1  '$ Number falls in past yr:  0 0 0 1  Injury with Fall?  '1 1 1 '$ 0  Risk for fall due to :   History of fall(s);Impaired mobility History of fall(s);Impaired balance/gait History of fall(s)  Follow up Falls evaluation completed;Falls prevention discussed  Falls evaluation completed Falls evaluation completed Falls evaluation completed    FALL RISK PREVENTION PERTAINING TO THE HOME: Home free of loose throw rugs in walkways, pet beds, electrical cords, etc? Yes  Adequate lighting in your home to reduce risk of falls? Yes   ASSISTIVE DEVICES UTILIZED TO PREVENT FALLS: Life alert? No  Use of a cane, walker or w/c? No  Grab bars in the bathroom? No  Shower chair or bench in shower? No  Elevated toilet seat or a handicapped toilet? No   TIMED UP AND GO: Was the test performed? No .   Cognitive Function:        11/27/2021    9:23 AM 11/26/2020    9:31 AM 11/26/2019    9:29 AM 11/23/2018  9:23 AM 08/30/2017   12:27 PM  6CIT Screen  What Year? 0 points 0 points 0 points 0 points 0 points  What month? 0 points 0 points 0 points 0 points 0 points  What time? 0 points 0 points   0 points  Count back from 20 0 points 0 points 0 points 0 points 0 points  Months in reverse 0  points 0 points 0 points 0 points 0 points  Repeat phrase 0 points 0 points  4 points 0 points  Total Score 0 points 0 points   0 points    Immunizations Immunization History  Administered Date(s) Administered   Fluad Quad(high Dose 65+) 12/06/2018   Influenza, High Dose Seasonal PF 12/04/2015, 11/23/2016, 11/21/2017, 10/21/2020   Influenza,inj,Quad PF,6+ Mos 02/20/2015   Influenza-Unspecified 10/20/2021   PFIZER(Purple Top)SARS-COV-2 Vaccination 01/31/2019, 02/21/2019, 10/21/2019, 11/04/2020   Pneumococcal Conjugate-13 12/19/2017   Pneumococcal-Unspecified 01/26/2003   Rsv, Bivalent, Protein Subunit Rsvpref,pf Evans Lance) 11/09/2021   Zoster Recombinat (Shingrix) 03/10/2018, 06/23/2018   Pneumococcal vaccine status: Due, Education has been provided regarding the importance of this vaccine. Advised may receive this vaccine at local pharmacy or Health Dept. Aware to provide a copy of the vaccination record if obtained from local pharmacy or Health Dept. Verbalized acceptance and understanding.  Screening Tests Health Maintenance  Topic Date Due   DEXA SCAN  Never done   MAMMOGRAM  11/27/2021   COVID-19 Vaccine (5 - Pfizer series) 12/10/2021 (Originally 12/30/2020)   Pneumonia Vaccine 59+ Years old (2 - PPSV23 or PCV20) 01/26/2022 (Originally 12/20/2018)   TETANUS/TDAP  06/12/2024 (Originally 03/11/1947)   Medicare Annual Wellness (AWV)  11/28/2022   INFLUENZA VACCINE  Completed   Zoster Vaccines- Shingrix  Completed   HPV VACCINES  Aged Out    Health Maintenance Health Maintenance Due  Topic Date Due   DEXA SCAN  Never done   MAMMOGRAM  11/27/2021   Lung Cancer Screening: (Low Dose CT Chest recommended if Age 43-80 years, 30 pack-year currently smoking OR have quit w/in 15years.) does not qualify.   Hepatitis C Screening: does not qualify.  Vision Screening: Recommended annual ophthalmology exams for early detection of glaucoma and other disorders of the eye.  Dental  Screening: Recommended annual dental exams for proper oral hygiene.  Community Resource Referral / Chronic Care Management: CRR required this visit?  No   CCM required this visit?  No      Plan:     I have personally reviewed and noted the following in the patient's chart:   Medical and social history Use of alcohol, tobacco or illicit drugs  Current medications and supplements including opioid prescriptions. Patient is not currently taking opioid prescriptions. Functional ability and status Nutritional status Physical activity Advanced directives List of other physicians Hospitalizations, surgeries, and ER visits in previous 12 months Vitals Screenings to include cognitive, depression, and falls Referrals and appointments  In addition, I have reviewed and discussed with patient certain preventive protocols, quality metrics, and best practice recommendations. A written personalized care plan for preventive services as well as general preventive health recommendations were provided to patient.     Leta Jungling, LPN   35/07/175

## 2021-11-29 ENCOUNTER — Encounter: Payer: Self-pay | Admitting: Internal Medicine

## 2021-11-29 NOTE — Assessment & Plan Note (Signed)
Follow cbc.  

## 2021-11-29 NOTE — Assessment & Plan Note (Signed)
No evidence of volume overload on exam.  Continue entrestro, aldactone, lasix and metoprolol.  Follow.

## 2021-11-29 NOTE — Assessment & Plan Note (Signed)
Recently evaluated by oncology for increase CEA.  PET ok.  Follow.

## 2021-11-29 NOTE — Assessment & Plan Note (Signed)
Documented per cardiology. On metoprolol. Stable.

## 2021-11-29 NOTE — Assessment & Plan Note (Signed)
Taking metoprolol, spironolactone, entrestro and imdur.  Has lasix to take if needed.  Weight has been stable.  Follow.

## 2021-11-29 NOTE — Assessment & Plan Note (Signed)
Was on crestor. Intolerance.  Off now.  Follow lipid panel.

## 2021-11-29 NOTE — Assessment & Plan Note (Signed)
Avoid antiinflammatories.  Follow metabolic panel.  

## 2021-11-29 NOTE — Assessment & Plan Note (Signed)
Continue aldactone, entresto, metoprolol and lasix.  Follow pressures.

## 2021-11-29 NOTE — Assessment & Plan Note (Signed)
Breathing stable.  Follow.    

## 2021-11-29 NOTE — Assessment & Plan Note (Signed)
Follow met b and a1c.  

## 2021-11-30 ENCOUNTER — Ambulatory Visit: Payer: Medicare HMO | Admitting: Urology

## 2021-11-30 ENCOUNTER — Encounter: Payer: Self-pay | Admitting: Urology

## 2021-11-30 VITALS — BP 166/81 | HR 84 | Ht 62.0 in | Wt 139.0 lb

## 2021-11-30 DIAGNOSIS — N3941 Urge incontinence: Secondary | ICD-10-CM | POA: Diagnosis not present

## 2021-11-30 MED ORDER — GEMTESA 75 MG PO TABS: 75.0000 mg | ORAL_TABLET | Freq: Every day | ORAL | 3 refills | Status: DC

## 2021-11-30 NOTE — Progress Notes (Signed)
11/30/2021 10:52 AM   Kathy Dominguez Fillers August 24, 1928 [id number]  Referring provider: Einar Pheasant, [address] [place],  [place] [address]  Chief Complaint  Patient presents with   Cysto    HPI: I was consulted to assist the patient's urgency incontinence.  She can have foot on the floor syndrome.  I think she wears 1 or 2 pads a day but it was difficult to quantitate.  No stress incontinence or bedwetting   She voids approximately every hour and every 2 hours at night   She has had a TIA and a hysterectomy    Reassess in 6 weeks on Myrbetriq 50 mg samples and prescription for pelvic examination cystoscopy and proceed accordingly.  She has frequency and nocturia.  She could not leave a sample today but if she does we will send for culture   Today Modest improvement with Myrbetriq but still having urgency and some leaking.  Clinically not infected.  Frequency stable.  Very narrow introitus with no prolapse or stress incontinence  Cystoscopy: Patient underwent flexible cystoscopy.  Bladder mucosa and trigone were normal.  No cystitis    PMH: Past Medical History:  Diagnosis Date   Asthma    Chronic systolic CHF (congestive heart failure) ([facility])    a. 01/2015 Echo: EF 25-30%, sev diff HK, mild to mod MR, mildly dil LA, nl RV fxn, PASP 61 mmHg.   Colon cancer ([facility]) 2005   Hypercholesterolemia    Hypertension    Hypertensive heart disease with CHF (congestive heart failure) ([facility])    NICM (nonischemic cardiomyopathy) ([facility])    a. 01/2015 Echo: EF 25-30% sev diff HK;  b. 01/2015 Lexi MV: EF 19%, small defect of mild severity in apex - likely breast attenuation, no ischemia.   NSVT (nonsustained ventricular tachycardia) ([facility])    Osteoporosis     Surgical History: Past Surgical History:  Procedure Laterality Date   ABDOMINAL HYSTERECTOMY     BREAST BIOPSY Left 4/15`   BENIGN BREAST EPITHELIUM WITH NODULAR FIBROSIS.   INGUINAL HERNIA REPAIR Left  05/04/2016   Procedure: HERNIA REPAIR INGUINAL ADULT;  Surgeon: Robert Bellow, MD;  Location: [facility] ORS;  Service: General;  Laterality: Left;   THYROIDECTOMY, PARTIAL  1956   TUBAL LIGATION      Home Medications:  Allergies as of 11/30/2021   No Known Allergies      Medication List        Accurate as of November 30, 2021 10:52 AM. If you have any questions, ask your nurse or doctor.          acetaminophen 325 MG tablet Commonly known as: TYLENOL Take 650 mg by mouth every 6 (six) hours as needed.   albuterol 108 (90 Base) MCG/ACT inhaler Commonly known as: VENTOLIN HFA Inhale 2 puffs into the lungs every 6 (six) hours as needed for wheezing or shortness of breath.   aspirin EC 81 MG tablet Take 81 mg by mouth daily. Swallow whole.   Entresto 97-103 MG Generic drug: sacubitril-valsartan Take 1 tablet by mouth 2 (two) times daily.   fluticasone 50 MCG/ACT nasal spray Commonly known as: FLONASE Place 1 spray into both nostrils daily.   fluticasone furoate-vilanterol 100-25 MCG/ACT Aepb Commonly known as: Breo Ellipta Inhale 1 puff into the lungs daily.   furosemide 40 MG tablet Commonly known as: LASIX Take 1/2-1 tablet as directed per weight gain   isosorbide mononitrate 30 MG 24 hr tablet Commonly known as: IMDUR Take 1  tablet (30 mg total) by mouth daily.   metoprolol succinate 25 MG 24 hr tablet Commonly known as: TOPROL-XL Take 1 tablet (25 mg total) by mouth daily.   mirabegron ER 50 MG Tb24 tablet Commonly known as: MYRBETRIQ Take 1 tablet (50 mg total) by mouth daily.   senna-docusate 8.6-50 MG tablet Commonly known as: Senokot-S Take 2 tablets by mouth 2 (two) times daily as needed for mild constipation.   spironolactone 25 MG tablet Commonly known as: ALDACTONE Take 1 tablet (25 mg total) by mouth daily.   Vitamin D-3 25 MCG (1000 UT) Caps Take 2,000 Units by mouth daily.        Allergies: No Known Allergies  Family  History: Family History  Problem Relation Age of Onset   Stroke Mother    Colon cancer Father    Breast cancer Daughter 36   Breast cancer Cousin     Social History:  reports that she has never smoked. She has never used smokeless tobacco. She reports that she does not drink alcohol and does not use drugs.  ROS:                                        Physical Exam: BP (!) 166/81   Pulse 84   Ht '5\' 2"'$  (1.575 m)   Wt 63 kg   LMP 02/21/1966   BMI 25.42 kg/m   Constitutional:  Alert and oriented, No acute distress.  Laboratory Data: Lab Results  Component Value Date   WBC 4.5 11/24/2021   HGB 12.3 11/24/2021   HCT 37.5 11/24/2021   MCV 91.6 11/24/2021   PLT 221.0 11/24/2021    Lab Results  Component Value Date   CREATININE 1.19 11/24/2021    No results found for: "PSA"  No results found for: "TESTOSTERONE"  Lab Results  Component Value Date   HGBA1C 6.3 09/17/2021    Urinalysis    Component Value Date/Time   COLORURINE YELLOW (A) 06/28/2019 0813   APPEARANCEUR Hazy (A) 10/20/2021 1118   LABSPEC 1.041 (H) 06/28/2019 0813   PHURINE 6.0 06/28/2019 0813   GLUCOSEU Negative 10/20/2021 1118   GLUCOSEU NEGATIVE 02/20/2015 0945   HGBUR NEGATIVE 06/28/2019 0813   BILIRUBINUR Negative 10/20/2021 1118   KETONESUR 5 (A) 06/28/2019 0813   PROTEINUR Negative 10/20/2021 1118   PROTEINUR NEGATIVE 06/28/2019 0813   UROBILINOGEN 0.2 02/20/2015 0945   NITRITE Negative 10/20/2021 1118   NITRITE NEGATIVE 06/28/2019 0813   LEUKOCYTESUR 1+ (A) 10/20/2021 1118   LEUKOCYTESUR LARGE (A) 06/28/2019 0813    Pertinent Imaging:   Assessment & Plan: Patient was a partial responder to Myrbetriq.  Reassess in 6 weeks on Gemtesa samples and prescription and proceed accordingly.  Percutaneous tibial nerve stimulation likely a very good option and we can always go back on the Myrbetriq  1. Urge incontinence of urine    No follow-ups on file.  Reece Packer, MD  [facility] [address], [place], [place] [address] [phone]

## 2021-12-02 ENCOUNTER — Ambulatory Visit
Admission: RE | Admit: 2021-12-02 | Discharge: 2021-12-02 | Disposition: A | Discharge status: Home / Self Care | Payer: Medicare HMO | Source: Ambulatory Visit | Attending: Internal Medicine | Admitting: Internal Medicine

## 2021-12-02 DIAGNOSIS — Z1231 Encounter for screening mammogram for malignant neoplasm of breast: Secondary | ICD-10-CM

## 2021-12-02 DIAGNOSIS — J45909 Unspecified asthma, uncomplicated: Secondary | ICD-10-CM | POA: Diagnosis not present

## 2021-12-02 DIAGNOSIS — Z8639 Personal history of other endocrine, nutritional and metabolic disease: Secondary | ICD-10-CM | POA: Insufficient documentation

## 2021-12-02 DIAGNOSIS — M8589 Other specified disorders of bone density and structure, multiple sites: Secondary | ICD-10-CM | POA: Diagnosis not present

## 2021-12-02 DIAGNOSIS — E559 Vitamin D deficiency, unspecified: Secondary | ICD-10-CM | POA: Diagnosis not present

## 2021-12-02 DIAGNOSIS — Z13820 Encounter for screening for osteoporosis: Secondary | ICD-10-CM | POA: Insufficient documentation

## 2021-12-02 DIAGNOSIS — Z780 Asymptomatic menopausal state: Secondary | ICD-10-CM | POA: Insufficient documentation

## 2021-12-03 ENCOUNTER — Telehealth: Payer: Self-pay

## 2021-12-03 NOTE — Telephone Encounter (Signed)
LMTCB for bone density results.

## 2021-12-03 NOTE — Telephone Encounter (Signed)
Pt returning call

## 2021-12-08 ENCOUNTER — Other Ambulatory Visit: Payer: Self-pay | Admitting: Cardiovascular Disease

## 2021-12-16 ENCOUNTER — Other Ambulatory Visit: Payer: Self-pay | Admitting: Family

## 2021-12-21 ENCOUNTER — Telehealth: Payer: Self-pay | Admitting: Cardiovascular Disease

## 2021-12-21 ENCOUNTER — Other Ambulatory Visit: Payer: Self-pay | Admitting: Internal Medicine

## 2021-12-21 NOTE — Telephone Encounter (Signed)
Patient dropped off PAF form placed in box.

## 2021-12-22 NOTE — Telephone Encounter (Signed)
Completed and signed application for [employer]rest[employer] patient assistance faxed to [employer] at [phone].  Fax confirmation received.   Completed forms placed in the file cabinet for assistance.

## 2021-12-22 NOTE — Telephone Encounter (Signed)
[employer] [name]) patient assistance forms pulled from Dr. Donivan Scull box.  Holding for MD to sign.

## 2022-01-11 ENCOUNTER — Ambulatory Visit: Payer: Medicare HMO | Admitting: Urology

## 2022-01-11 ENCOUNTER — Encounter: Payer: Self-pay | Admitting: Urology

## 2022-01-11 VITALS — BP 153/83 | HR 66 | Ht 62.0 in | Wt 141.1 lb

## 2022-01-11 DIAGNOSIS — N3946 Mixed incontinence: Secondary | ICD-10-CM

## 2022-01-11 DIAGNOSIS — N3941 Urge incontinence: Secondary | ICD-10-CM

## 2022-01-11 LAB — URINALYSIS, COMPLETE
Bilirubin, UA: NEGATIVE
Glucose, UA: NEGATIVE
Ketones, UA: NEGATIVE
Leukocytes,UA: NEGATIVE
Nitrite, UA: NEGATIVE
Protein,UA: NEGATIVE
RBC, UA: NEGATIVE
Specific Gravity, UA: <1.005 — ABNORMAL LOW (ref 1.005–1.030)
Urobilinogen, Ur: 0.2 mg/dL (ref 0.2–1.0)
pH, UA: 5 (ref 5.0–7.5)

## 2022-01-11 LAB — MICROSCOPIC EXAMINATION

## 2022-01-11 NOTE — Progress Notes (Signed)
01/11/2022 2:17 PM   Kathy Dominguez December 01, 1928 [id number]  Referring provider: Einar Pheasant, [place] [address] [place],   [address]  Chief Complaint  Patient presents with   Follow-up    6 week follow up    HPI: I was consulted to assist the patient's urgency incontinence.  She can have foot on the floor syndrome.  I think she wears 1 or 2 pads a day but it was difficult to quantitate.  No stress incontinence or bedwetting   She voids approximately every hour and every 2 hours at night   She has had a TIA and a hysterectomy     Reassess in 6 weeks on Myrbetriq 50 mg samples and prescription for pelvic examination cystoscopy and proceed accordingly.  She has frequency and nocturia.  She could not leave a sample today but if she does we will send for culture    Today Modest improvement with Myrbetriq but still having urgency and some leaking.  Clinically not infected.  Frequency stable.   Very narrow introitus with no prolapse or stress incontinence   Cystoscopy: Patient underwent flexible cystoscopy.  Bladder mucosa and trigone were normal.  No cystitis    Patient was a partial responder to Myrbetriq. Reassess in 6 weeks on Gemtesa samples and prescription and proceed accordingly. Percutaneous tibial nerve stimulation likely a very good option and we can always go back on the Myrbetriq   Today Frequency stable.  Urge incontinence and less urgency.  Clinically not infected   PMH: Past Medical History:  Diagnosis Date   Asthma    Chronic systolic CHF (congestive heart failure) ([facility])    a. 01/2015 Echo: EF 25-30%, sev diff HK, mild to mod MR, mildly dil LA, nl RV fxn, PASP 61 mmHg.   Colon cancer ([facility]) 2005   Hypercholesterolemia    Hypertension    Hypertensive heart disease with CHF (congestive heart failure) ([facility])    NICM (nonischemic cardiomyopathy) ([facility])    a. 01/2015 Echo: EF 25-30% sev diff HK;  b. 01/2015 Lexi MV: EF 19%, small  defect of mild severity in apex - likely breast attenuation, no ischemia.   NSVT (nonsustained ventricular tachycardia) ([facility])    Osteoporosis     Surgical History: Past Surgical History:  Procedure Laterality Date   ABDOMINAL HYSTERECTOMY     BREAST BIOPSY Left 4/15`   BENIGN BREAST EPITHELIUM WITH NODULAR FIBROSIS.   INGUINAL HERNIA REPAIR Left 05/04/2016   Procedure: HERNIA REPAIR INGUINAL ADULT;  Surgeon: Robert Bellow, MD;  Location: [facility] ORS;  Service: General;  Laterality: Left;   THYROIDECTOMY, PARTIAL  1956   TUBAL LIGATION      Home Medications:  Allergies as of 01/11/2022   No Known Allergies      Medication List        Accurate as of January 11, 2022  2:17 PM. If you have any questions, ask your nurse or doctor.          acetaminophen 325 MG tablet Commonly known as: TYLENOL Take 650 mg by mouth every 6 (six) hours as needed.   albuterol 108 (90 Base) MCG/ACT inhaler Commonly known as: VENTOLIN HFA Inhale 2 puffs into the lungs every 6 (six) hours as needed for wheezing or shortness of breath.   aspirin EC 81 MG tablet Take 81 mg by mouth daily. Swallow whole.   Entresto 97-103 MG Generic drug: sacubitril-valsartan Take 1 tablet by mouth 2 (two) times daily.   fluticasone 50  MCG/ACT nasal spray Commonly known as: FLONASE Place 1 spray into both nostrils daily.   fluticasone furoate-vilanterol 100-25 MCG/ACT Aepb Commonly known as: Breo Ellipta Inhale 1 puff into the lungs daily.   furosemide 40 MG tablet Commonly known as: LASIX Take 1/2-1 tablet as directed per weight gain   Gemtesa 75 MG Tabs Generic drug: Vibegron Take 75 mg by mouth daily.   isosorbide mononitrate 30 MG 24 hr tablet Commonly known as: IMDUR Take 1 tablet (30 mg total) by mouth daily.   metoprolol succinate 25 MG 24 hr tablet Commonly known as: TOPROL-XL Take 1 tablet (25 mg total) by mouth daily.   senna-docusate 8.6-50 MG tablet Commonly known as:  Senokot-S Take 2 tablets by mouth 2 (two) times daily as needed for mild constipation.   spironolactone 25 MG tablet Commonly known as: ALDACTONE Take 1 tablet (25 mg total) by mouth daily.   Vitamin D-3 25 MCG (1000 UT) Caps Take 2,000 Units by mouth daily.        Allergies: No Known Allergies  Family History: Family History  Problem Relation Age of Onset   Stroke Mother    Colon cancer Father    Breast cancer Daughter 48   Breast cancer Cousin     Social History:  reports that she has never smoked. She has never used smokeless tobacco. She reports that she does not drink alcohol and does not use drugs.  ROS:                                        Physical Exam: Ht '5\' 2"'$  (1.575 m)   Wt 63 kg   LMP 02/21/1966   BMI 25.42 kg/m   Constitutional:  Alert and oriented, No acute distress. HEENT: [place] AT, moist mucus membranes.  Trachea midline, no masses.   Laboratory Data: Lab Results  Component Value Date   WBC 4.5 11/24/2021   HGB 12.3 11/24/2021   HCT 37.5 11/24/2021   MCV 91.6 11/24/2021   PLT 221.0 11/24/2021    Lab Results  Component Value Date   CREATININE 1.19 11/24/2021    No results found for: "PSA"  No results found for: "TESTOSTERONE"  Lab Results  Component Value Date   HGBA1C 6.3 09/17/2021    Urinalysis    Component Value Date/Time   COLORURINE YELLOW (A) 06/28/2019 0813   APPEARANCEUR Hazy (A) 10/20/2021 1118   LABSPEC 1.041 (H) 06/28/2019 0813   PHURINE 6.0 06/28/2019 0813   GLUCOSEU Negative 10/20/2021 1118   GLUCOSEU NEGATIVE 02/20/2015 0945   HGBUR NEGATIVE 06/28/2019 0813   BILIRUBINUR Negative 10/20/2021 1118   KETONESUR 5 (A) 06/28/2019 0813   PROTEINUR Negative 10/20/2021 1118   PROTEINUR NEGATIVE 06/28/2019 0813   UROBILINOGEN 0.2 02/20/2015 0945   NITRITE Negative 10/20/2021 1118   NITRITE NEGATIVE 06/28/2019 0813   LEUKOCYTESUR 1+ (A) 10/20/2021 1118   LEUKOCYTESUR LARGE (A) 06/28/2019 0813     Pertinent Imaging  Assessment & Plan: Reassess in 4 months on Gemtesa.  He is about age and side effects etc. think that is a good choice for her.  We can always talk about percutaneous tibial nerve stimulation in the future and/or antimuscarinics  1. Urge incontinence of urine  - Urinalysis, Complete  2. Mixed incontinence  - Urinalysis, Complete   No follow-ups on file.  Reece Packer, MD  [facility] [address], [address]  [place], [place] [address] [phone]

## 2022-02-24 ENCOUNTER — Ambulatory Visit: Payer: Medicare HMO | Admitting: Internal Medicine

## 2022-02-24 ENCOUNTER — Encounter: Payer: Self-pay | Admitting: Internal Medicine

## 2022-02-24 VITALS — BP 128/78 | HR 75 | Temp 97.9°F | Resp 16 | Ht 62.0 in | Wt 140.8 lb

## 2022-02-24 DIAGNOSIS — N1832 Chronic kidney disease, stage 3b: Secondary | ICD-10-CM

## 2022-02-24 DIAGNOSIS — I10 Essential (primary) hypertension: Secondary | ICD-10-CM | POA: Diagnosis not present

## 2022-02-24 DIAGNOSIS — R739 Hyperglycemia, unspecified: Secondary | ICD-10-CM | POA: Diagnosis not present

## 2022-02-24 DIAGNOSIS — I110 Hypertensive heart disease with heart failure: Secondary | ICD-10-CM

## 2022-02-24 DIAGNOSIS — J4520 Mild intermittent asthma, uncomplicated: Secondary | ICD-10-CM

## 2022-02-24 DIAGNOSIS — I700 Atherosclerosis of aorta: Secondary | ICD-10-CM

## 2022-02-24 DIAGNOSIS — Z85038 Personal history of other malignant neoplasm of large intestine: Secondary | ICD-10-CM

## 2022-02-24 DIAGNOSIS — I5022 Chronic systolic (congestive) heart failure: Secondary | ICD-10-CM

## 2022-02-24 DIAGNOSIS — M79675 Pain in left toe(s): Secondary | ICD-10-CM

## 2022-02-24 DIAGNOSIS — I428 Other cardiomyopathies: Secondary | ICD-10-CM

## 2022-02-24 DIAGNOSIS — I5042 Chronic combined systolic (congestive) and diastolic (congestive) heart failure: Secondary | ICD-10-CM

## 2022-02-24 DIAGNOSIS — E7800 Pure hypercholesterolemia, unspecified: Secondary | ICD-10-CM

## 2022-02-24 DIAGNOSIS — M25561 Pain in right knee: Secondary | ICD-10-CM

## 2022-02-24 DIAGNOSIS — I4729 Other ventricular tachycardia: Secondary | ICD-10-CM

## 2022-02-24 LAB — BASIC METABOLIC PANEL
BUN: 28 mg/dL — ABNORMAL HIGH (ref 6–23)
CO2: 31 mEq/L (ref 19–32)
Calcium: 9.4 mg/dL (ref 8.4–10.5)
Chloride: 103 mEq/L (ref 96–112)
Creatinine, Ser: 1.26 mg/dL — ABNORMAL HIGH (ref 0.40–1.20)
GFR: 37.2 mL/min — ABNORMAL LOW (ref >60.00)
Glucose, Bld: 85 mg/dL (ref 70–99)
Potassium: 4.8 mEq/L (ref 3.5–5.1)
Sodium: 141 mEq/L (ref 135–145)

## 2022-02-24 LAB — HEPATIC FUNCTION PANEL
ALT: 10 U/L (ref 0–35)
AST: 18 U/L (ref 0–37)
Albumin: 4.3 g/dL (ref 3.5–5.2)
Alkaline Phosphatase: 63 U/L (ref 39–117)
Bilirubin, Direct: 0 mg/dL (ref 0.0–0.3)
Total Bilirubin: 0.3 mg/dL (ref 0.2–1.2)
Total Protein: 6.8 g/dL (ref 6.0–8.3)

## 2022-02-24 LAB — HEMOGLOBIN A1C: Hgb A1c MFr Bld: 6.3 % (ref 4.6–6.5)

## 2022-02-24 NOTE — Progress Notes (Signed)
Subjective:    [name], female    DOB: Jan 12, 1929, 89 y.o.   MRN: [id number]  Patient here for  Chief Complaint  Patient presents with   Medical Management of Chronic Issues    HPI Here to follow up regarding CHF, hypertension and hypercholesterolemia.  Saw Dr Halford Chessman - 09/24/21 - continue breo and prn albuterol.  [place] - knee pain. Stable. taking tylenol. Saw Dr Matilde Sprang - urge incontinence - has tried myrbetriq. Last seen - 01/11/22 - trial of gemtesa. Saw Dr Rockey Situ 10/20/21 - lasix qod and entrestro. Feels breathing is stable.  No chest pain.  Eating.  No nausea or vomiting.  No bowel change reported. Having issues - left second toe.  Request referral to podiatry.     Past Medical History:  Diagnosis Date   Asthma    Chronic systolic CHF (congestive heart failure) ([place])    a. 01/2015 Echo: EF 25-30%, sev diff HK, mild to mod MR, mildly dil LA, nl RV fxn, PASP 61 mmHg.   Colon cancer ([place]) 2005   Hypercholesterolemia    Hypertension    Hypertensive heart disease with CHF (congestive heart failure) ([place])    NICM (nonischemic cardiomyopathy) ([place])    a. 01/2015 Echo: EF 25-30% sev diff HK;  b. 01/2015 Lexi MV: EF 19%, small defect of mild severity in apex - likely breast attenuation, no ischemia.   NSVT (nonsustained ventricular tachycardia) (HCC)    Osteoporosis    Past Surgical History:  Procedure Laterality Date   ABDOMINAL HYSTERECTOMY     BREAST BIOPSY Left 4/15`   BENIGN BREAST EPITHELIUM WITH NODULAR FIBROSIS.   INGUINAL HERNIA REPAIR Left 05/04/2016   Procedure: HERNIA REPAIR INGUINAL ADULT;  Surgeon: Robert Bellow, MD;  Location: [facility] ORS;  Service: General;  Laterality: Left;   THYROIDECTOMY, PARTIAL  1956   TUBAL LIGATION     Family History  Problem Relation Age of Onset   Stroke Mother    Colon cancer Father    Breast cancer Daughter 32   Breast cancer Cousin    Social History   Socioeconomic History   Marital status: Widowed     Spouse name: Not on file   Number of children: Not on file   Years of education: Not on file   Highest education level: Not on file  Occupational History   Not on file  Tobacco Use   Smoking status: Never   Smokeless tobacco: Never  Vaping Use   Vaping Use: Never used  Substance and Sexual Activity   Alcohol use: No    Alcohol/week: 0.0 standard drinks of alcohol   Drug use: No   Sexual activity: Not on file  Other Topics Concern   Not on file  Social History Narrative   Not on file   Social Determinants of Health   Financial Resource Strain: Low Risk  (11/27/2021)   Overall Financial Resource Strain (CARDIA)    Difficulty of Paying Living Expenses: Not hard at all  Food Insecurity: No Food Insecurity (11/27/2021)   Hunger Vital Sign    Worried About Running Out of Food in the Last Year: Never true    [place] in the Last Year: Never true  Transportation Needs: No Transportation Needs (11/27/2021)   PRAPARE - Hydrologist (Medical): No    Lack of Transportation (Non-Medical): No  Physical Activity: Insufficiently Active (11/27/2021)   Exercise Vital Sign    Days of  Exercise per Week: 7 days    Minutes of Exercise per Session: 20 min  Stress: No Stress Concern Present (11/27/2021)   [place]    Feeling of Stress : Not at all  Social Connections: Unknown (11/27/2021)   Social Connection and Isolation Panel [NHANES]    Frequency of Communication with Friends and Family: More than three times a week    Frequency of Social Gatherings with Friends and Family: Once a week    Attends Religious Services: Not on Diplomatic Services operational officer of Clubs or Organizations: No    Attends Archivist Meetings: Not on file    Marital Status: Widowed     Review of Systems  Constitutional:  Negative for appetite change and unexpected weight change.  HENT:  Negative for congestion and  sinus pressure.   Respiratory:  Negative for cough, chest tightness and shortness of breath.   Cardiovascular:  Negative for chest pain, palpitations and leg swelling.  Gastrointestinal:  Negative for abdominal pain, diarrhea, nausea and vomiting.  Genitourinary:  Negative for difficulty urinating and dysuria.  Musculoskeletal:  Negative for joint swelling and myalgias.  Skin:  Negative for color change and rash.  Neurological:  Negative for dizziness and headaches.  Psychiatric/Behavioral:  Negative for agitation and dysphoric mood.        Objective:     BP 128/78   Pulse 75   Temp 97.9 F (36.6 C)   Resp 16   Ht '5\' 2"'$  (1.575 m)   Wt 140 lb 12.8 oz (63.9 kg)   LMP 02/21/1966   SpO2 98%   BMI 25.75 kg/m  Wt Readings from Last 3 Encounters:  02/24/22 140 lb 12.8 oz (63.9 kg)  01/11/22 141 lb 2 oz (64 kg)  11/30/21 139 lb (63 kg)    Physical Exam Vitals reviewed.  Constitutional:      General: She is not in acute distress.    Appearance: Normal appearance.  HENT:     Head: Normocephalic and atraumatic.     Right Ear: External ear normal.     Left Ear: External ear normal.  Eyes:     General: No scleral icterus.       Right eye: No discharge.        Left eye: No discharge.     Conjunctiva/sclera: Conjunctivae normal.  Neck:     Thyroid: No thyromegaly.  Cardiovascular:     Rate and Rhythm: Normal rate and regular rhythm.  Pulmonary:     Effort: No respiratory distress.     Breath sounds: Normal breath sounds. No wheezing.  Abdominal:     General: Bowel sounds are normal.     Palpations: Abdomen is soft.     Tenderness: There is no abdominal tenderness.  Musculoskeletal:        General: No swelling or tenderness.     Cervical back: Neck supple. No tenderness.  Lymphadenopathy:     Cervical: No cervical adenopathy.  Skin:    Findings: No erythema or rash.  Neurological:     Mental Status: She is alert.  Psychiatric:        Mood and Affect: Mood normal.         Behavior: Behavior normal.      Outpatient Encounter Medications as of 02/24/2022  Medication Sig   acetaminophen (TYLENOL) 325 MG tablet Take 650 mg by mouth every 6 (six) hours as needed.   albuterol (VENTOLIN HFA)  108 (90 Base) MCG/ACT inhaler Inhale 2 puffs into the lungs every 6 (six) hours as needed for wheezing or shortness of breath.   aspirin 81 MG EC tablet Take 81 mg by mouth daily. Swallow whole.   Cholecalciferol (VITAMIN D-3) 1000 UNITS CAPS Take 2,000 Units by mouth daily.   fluticasone (FLONASE) 50 MCG/ACT nasal spray Place 1 spray into both nostrils daily.   fluticasone furoate-vilanterol (BREO ELLIPTA) 100-25 MCG/ACT AEPB Inhale 1 puff into the lungs daily.   furosemide (LASIX) 40 MG tablet Take 1/2-1 tablet as directed per weight gain   isosorbide mononitrate (IMDUR) 30 MG 24 hr tablet Take 1 tablet (30 mg total) by mouth daily.   metoprolol succinate (TOPROL-XL) 25 MG 24 hr tablet Take 1 tablet (25 mg total) by mouth daily.   sacubitril-valsartan (ENTRESTO) 97-103 MG Take 1 tablet by mouth 2 (two) times daily.   senna-docusate (SENOKOT-S) 8.6-50 MG tablet Take 2 tablets by mouth 2 (two) times daily as needed for mild constipation.   spironolactone (ALDACTONE) 25 MG tablet Take 1 tablet (25 mg total) by mouth daily.   Vibegron (GEMTESA) 75 MG TABS Take 75 mg by mouth daily.   No facility-administered encounter medications on file as of 02/24/2022.     Lab Results  Component Value Date   WBC 4.5 11/24/2021   HGB 12.3 11/24/2021   HCT 37.5 11/24/2021   PLT 221.0 11/24/2021   GLUCOSE 85 02/24/2022   CHOL 229 (H) 09/17/2021   TRIG 58.0 09/17/2021   HDL 80.80 09/17/2021   LDLDIRECT 128.3 02/08/2013   LDLCALC 136 (H) 09/17/2021   ALT 10 02/24/2022   AST 18 02/24/2022   NA 141 02/24/2022   K 4.8 02/24/2022   CL 103 02/24/2022   CREATININE 1.26 (H) 02/24/2022   BUN 28 (H) 02/24/2022   CO2 31 02/24/2022   TSH 0.78 04/20/2021   INR 0.9 06/28/2019   HGBA1C 6.3  02/24/2022    MM 3D SCREEN BREAST BILATERAL  Result Date: 12/03/2021 CLINICAL DATA:  Screening. EXAM: DIGITAL SCREENING BILATERAL MAMMOGRAM WITH TOMOSYNTHESIS AND CAD TECHNIQUE: Bilateral screening digital craniocaudal and mediolateral oblique mammograms were obtained. Bilateral screening digital breast tomosynthesis was performed. The images were evaluated with computer-aided detection. COMPARISON:  Previous exam(s). ACR Breast Density Category c: The breast tissue is heterogeneously dense, which may obscure small masses. FINDINGS: There are no findings suspicious for malignancy. IMPRESSION: No mammographic evidence of malignancy. A result letter of this screening mammogram will be mailed directly to the patient. RECOMMENDATION: Screening mammogram in one year. (Code:SM-B-01Y) BI-RADS CATEGORY  1: Negative. Electronically Signed   By: Franki Cabot M.D.   On: 12/03/2021 13:55   DG Bone Density  Result Date: 12/02/2021 EXAM: DUAL X-RAY ABSORPTIOMETRY (DXA) FOR BONE MINERAL DENSITY IMPRESSION: Your patient Ronalee Scheunemann completed a BMD test on 12/02/2021 using the [place] (software version: 14.10) manufactured by [employer]. The following summarizes the results of our evaluation. Technologist: SCE PATIENT BIOGRAPHICAL: Name: Monea, Pesantez Patient ID: [id number] Birth Date: 04/13/1928 Height: 62.0 in. Gender: Female Exam Date: 12/02/2021 Weight: 139.3 lbs. Indications: Advanced Age, Asthma, Height Loss, Hysterectomy, Osteoarthritis, Postmenopausal, Vitamin D Deficiency, History of Fracture (Adult) Fractures: Foot Right Treatments: Vitamin D DENSITOMETRY RESULTS: Site      Region      Measured Date Measured Age WHO Classification Young Adult T-score BMD         %Change vs. Previous Significant Change (*) AP Spine L1-L2 12/02/2023 91.7 Osteopenia -1.1  1.040 g/cm2 - - DualFemur Total Right 12/02/2021 91.7 Osteopenia -1.5 0.824 g/cm2 - - ASSESSMENT: The BMD measured at Femur Total  Right is 0.824 g/cm2 with a T-score of -1.5. This patient is considered osteopenic according to [facility]) criteria. L3 and L4 was excluded due to degenerative changes. Patient is not a candidate for FRAX due to age. The scan quality is good. World Pharmacologist [facility]) criteria for post-menopausal, Caucasian Women: Normal:                   T-score at or above -1 SD Osteopenia/low bone mass: T-score between -1 and -2.5 SD Osteoporosis:             T-score at or below -2.5 SD RECOMMENDATIONS: 1. All patients should optimize calcium and vitamin D intake. 2. Consider FDA-approved medical therapies in postmenopausal women and men aged 33 years and older, based on the following: a. A hip or vertebral(clinical or morphometric) fracture b. T-score < -2.5 at the femoral neck or spine after appropriate evaluation to exclude secondary causes c. Low bone mass (T-score between -1.0 and -2.5 at the femoral neck or spine) and a 10-year probability of a hip fracture > 3% or a 10-year probability of a major osteoporosis-related fracture > 20% based on the [place]-adapted WHO algorithm 3. Clinician judgment and/or patient preferences may indicate treatment for people with 10-year fracture probabilities above or below these levels FOLLOW-UP: People with diagnosed cases of osteoporosis or at high risk for fracture should have regular bone mineral density tests. For patients eligible for Medicare, routine testing is allowed once every 2 years. The testing frequency can be increased to one year for patients who have rapidly progressing disease, those who are receiving or discontinuing medical therapy to restore bone mass, or have additional risk factors. I have reviewed this report, and agree with the above findings. [facility] Electronically Signed   By: Ammie Ferrier M.D.   On: 12/02/2021 12:17       Assessment & Plan:  Essential hypertension Assessment & Plan: Continue aldactone,  entresto, metoprolol and lasix.  Follow pressures. Follow metabolic panel.   Orders: -     Basic metabolic panel  Hypercholesterolemia Assessment & Plan: Was on crestor. Intolerance.  Off now.  Follow lipid panel.   Orders: -     Hepatic function panel  Hyperglycemia Assessment & Plan: Follow met b and a1c.   Orders: -     Hemoglobin A1c  Aortic atherosclerosis ([place]) Assessment & Plan: Was on crestor. Intolerance.  Off now.  Follow lipid panel.    Mild intermittent asthma without complication Assessment & Plan: Breathing stable.  Follow.    Chronic systolic heart failure (HCC) Assessment & Plan: Taking metoprolol, spironolactone, entrestro and imdur.  Has lasix to take if needed.  Weight has been stable.  Follow.    Stage 3b chronic kidney disease ([place]) Assessment & Plan: Avoid antiinflammatories.  Follow metabolic panel.    History of colon cancer Assessment & Plan: Recently evaluated by oncology for increase CEA.  PET ok.  Follow.    Hypertensive heart disease with chronic combined systolic and diastolic congestive heart failure (HCC) Assessment & Plan: No evidence of volume overload on exam.  Continue entrestro, aldactone, lasix and metoprolol.  Follow.    NICM (nonischemic cardiomyopathy) ([place]) Assessment & Plan: Taking metoprolol, spironolactone, entrestro and imdur.  Has lasix to take if needed.  Weight has been stable.  Follow.    NSVT (  nonsustained ventricular tachycardia) ([place]) Assessment & Plan: Documented per cardiology. On metoprolol. Stable.    Right knee pain, unspecified chronicity Assessment & Plan: Previously saw Dr Sabra Heck. Some persistent pain.  Takes tylenol prn.  Desires no further intervention right now.  Follow.    Pain of toe of left foot Assessment & Plan: Left second toe - request referral to podiatry.   Orders: -     Ambulatory referral to Podiatry     Einar Pheasant, MD

## 2022-02-25 ENCOUNTER — Telehealth: Payer: Self-pay

## 2022-02-25 NOTE — Telephone Encounter (Signed)
  LM FOR PT TO CB RE :  Einar Pheasant, MD  [facility] Please notify - kidney function - overall relatively stable.  We will follow.  Continue to avoid antiinflammatories.   Overall sugar control stable.  A1c 6.3.  sodium, potassium and liver function tests are wnl.

## 2022-02-26 NOTE — Telephone Encounter (Signed)
Patient returned office phone call , note from Dr Nicki Reaper was read to patient.

## 2022-02-27 ENCOUNTER — Encounter: Payer: Self-pay | Admitting: Internal Medicine

## 2022-02-27 DIAGNOSIS — M79676 Pain in unspecified toe(s): Secondary | ICD-10-CM | POA: Insufficient documentation

## 2022-02-27 NOTE — Assessment & Plan Note (Addendum)
Continue aldactone, entresto, metoprolol and lasix.  Follow pressures. Follow metabolic panel.

## 2022-02-27 NOTE — Assessment & Plan Note (Signed)
Breathing stable.  Follow.    

## 2022-02-27 NOTE — Assessment & Plan Note (Signed)
Left second toe - request referral to podiatry.

## 2022-02-27 NOTE — Assessment & Plan Note (Signed)
Taking metoprolol, spironolactone, entrestro and imdur.  Has lasix to take if needed.  Weight has been stable.  Follow.

## 2022-02-27 NOTE — Assessment & Plan Note (Signed)
Previously saw Dr Sabra Heck. Some persistent pain.  Takes tylenol prn.  Desires no further intervention right now.  Follow.

## 2022-02-27 NOTE — Assessment & Plan Note (Signed)
Avoid antiinflammatories.  Follow metabolic panel.  

## 2022-02-27 NOTE — Assessment & Plan Note (Addendum)
No evidence of volume overload on exam.  Continue entrestro, aldactone, lasix and metoprolol.  Follow.

## 2022-02-27 NOTE — Assessment & Plan Note (Signed)
Follow met b and a1c.  

## 2022-02-27 NOTE — Assessment & Plan Note (Signed)
Recently evaluated by oncology for increase CEA.  PET ok.  Follow.

## 2022-02-27 NOTE — Assessment & Plan Note (Signed)
Documented per cardiology. On metoprolol. Stable.

## 2022-02-27 NOTE — Assessment & Plan Note (Signed)
Was on crestor. Intolerance.  Off now.  Follow lipid panel.

## 2022-03-17 ENCOUNTER — Ambulatory Visit: Payer: Medicare HMO | Admitting: Podiatry

## 2022-03-17 ENCOUNTER — Encounter: Payer: Self-pay | Admitting: Internal Medicine

## 2022-03-17 ENCOUNTER — Encounter: Payer: Self-pay | Admitting: Podiatry

## 2022-03-17 DIAGNOSIS — M2042 Other hammer toe(s) (acquired), left foot: Secondary | ICD-10-CM | POA: Diagnosis not present

## 2022-03-17 DIAGNOSIS — D2372 Other benign neoplasm of skin of left lower limb, including hip: Secondary | ICD-10-CM

## 2022-03-17 DIAGNOSIS — M2040 Other hammer toe(s) (acquired), unspecified foot: Secondary | ICD-10-CM | POA: Insufficient documentation

## 2022-03-17 NOTE — Progress Notes (Signed)
Subjective:  [name], female    DOB: 1928/08/19,  MRN: [id number] HPI Chief Complaint  Patient presents with   Toe Pain    2nd toe left - hammertoe x years, developing a corn, hurts with shoes, feels like she has to keep something covering it to cushion   New Patient (Initial Visit)    89 y.o. female presents with the above complaint.   ROS: Denies fever chills nausea vomit muscle aches pains calf pain back pain chest pain shortness of breath.  Past Medical History:  Diagnosis Date   Asthma    Chronic systolic CHF (congestive heart failure) ([place])    a. 01/2015 Echo: EF 25-30%, sev diff HK, mild to mod MR, mildly dil LA, nl RV fxn, PASP 61 mmHg.   Colon cancer ([place]) 2005   Hypercholesterolemia    Hypertension    Hypertensive heart disease with CHF (congestive heart failure) ([place])    NICM (nonischemic cardiomyopathy) ([place])    a. 01/2015 Echo: EF 25-30% sev diff HK;  b. 01/2015 Lexi MV: EF 19%, small defect of mild severity in apex - likely breast attenuation, no ischemia.   NSVT (nonsustained ventricular tachycardia) (HCC)    Osteoporosis    Past Surgical History:  Procedure Laterality Date   ABDOMINAL HYSTERECTOMY     BREAST BIOPSY Left 4/15`   BENIGN BREAST EPITHELIUM WITH NODULAR FIBROSIS.   INGUINAL HERNIA REPAIR Left 05/04/2016   Procedure: HERNIA REPAIR INGUINAL ADULT;  Surgeon: Robert Bellow, MD;  Location: [facility] ORS;  Service: General;  Laterality: Left;   THYROIDECTOMY, PARTIAL  1956   TUBAL LIGATION      Current Outpatient Medications:    acetaminophen (TYLENOL) 325 MG tablet, Take 650 mg by mouth every 6 (six) hours as needed., Disp: , Rfl:    albuterol (VENTOLIN HFA) 108 (90 Base) MCG/ACT inhaler, Inhale 2 puffs into the lungs every 6 (six) hours as needed for wheezing or shortness of breath., Disp: 8.5 g, Rfl: 0   aspirin 81 MG EC tablet, Take 81 mg by mouth daily. Swallow whole., Disp: , Rfl:    Cholecalciferol (VITAMIN D-3) 1000 UNITS  CAPS, Take 2,000 Units by mouth daily., Disp: , Rfl:    fluticasone (FLONASE) 50 MCG/ACT nasal spray, Place 1 spray into both nostrils daily., Disp: 16 g, Rfl: 2   fluticasone furoate-vilanterol (BREO ELLIPTA) 100-25 MCG/ACT AEPB, Inhale 1 puff into the lungs daily., Disp: 30 each, Rfl: 5   furosemide (LASIX) 40 MG tablet, Take 1/2-1 tablet as directed per weight gain, Disp: 90 tablet, Rfl: 1   isosorbide mononitrate (IMDUR) 30 MG 24 hr tablet, Take 1 tablet (30 mg total) by mouth daily., Disp: 30 tablet, Rfl: 3   metoprolol succinate (TOPROL-XL) 25 MG 24 hr tablet, Take 1 tablet (25 mg total) by mouth daily., Disp: 90 tablet, Rfl: 1   sacubitril-valsartan (ENTRESTO) 97-103 MG, Take 1 tablet by mouth 2 (two) times daily., Disp: 180 tablet, Rfl: 1   senna-docusate (SENOKOT-S) 8.6-50 MG tablet, Take 2 tablets by mouth 2 (two) times daily as needed for mild constipation., Disp: 60 tablet, Rfl: 0   spironolactone (ALDACTONE) 25 MG tablet, Take 1 tablet (25 mg total) by mouth daily., Disp: 90 tablet, Rfl: 1   Vibegron (GEMTESA) 75 MG TABS, Take 75 mg by mouth daily., Disp: 90 tablet, Rfl: 3  No Known Allergies Review of Systems Objective:  There were no vitals filed for this visit.  General: Well developed, nourished, in no acute  distress, alert and oriented x3   Dermatological: Skin is warm, dry and supple bilateral. Nails x 10 are well maintained; remaining integument appears unremarkable at this time. There are no open sores, no preulcerative lesions, no rash or signs of infection present.  Small corn to the dorsal aspect PIPJ second and fourth.  Vascular: Dorsalis Pedis artery and Posterior Tibial artery pedal pulses are 2/4 bilateral with immedate capillary fill time. Pedal hair growth present. No varicosities and no lower extremity edema present bilateral.   Neruologic: Grossly intact via light touch bilateral. Vibratory intact via tuning fork bilateral. Protective threshold with Semmes  Wienstein monofilament intact to all pedal sites bilateral. Patellar and Achilles deep tendon reflexes 2+ bilateral. No Babinski or clonus noted bilateral.   Musculoskeletal: No gross boney pedal deformities bilateral. No pain, crepitus, or limitation noted with foot and ankle range of motion bilateral. Muscular strength 5/5 in all groups tested bilateral.  Flexible hammertoe deformities bilateral.  Gait: Unassisted, Nonantalgic.    Radiographs:  None taken  Assessment & Plan:   Assessment: Flexible hammertoe deformities resulting in clavus to the dorsal aspect of the PIPJ second toe and fourth toe left.  Plan: Debrided these benign skin lesions today for her.  Placed her in a corrected hammertoe device and provided her with silicone digital sleeves.     Dejanay Wamboldt T. [place], [place]

## 2022-03-18 ENCOUNTER — Other Ambulatory Visit: Payer: Self-pay | Admitting: Pulmonary Disease

## 2022-04-13 ENCOUNTER — Other Ambulatory Visit: Payer: Self-pay | Admitting: Cardiovascular Disease

## 2022-04-19 NOTE — Progress Notes (Unsigned)
Cardiology Office Note  Date:  04/20/2022   ID:  Kathy Dominguez, DOB 03/20/1928, MRN [id number]  PCP:  Einar Pheasant, MD   Chief Complaint  Patient presents with   Follow-up    6 month f/u.  Currently taking 1 tab of Lasix due to increasing wt.  Medications reviewed verbally.    HPI:  89 y/o ? with a h/o  HTN,   [facility] 01/28/2016 for shortness of breath,  diagnosed with acute systolic CHF , nonischemic 01/2016 ejection fraction 25-35%,  myoview showing no ischemia,  echo EF 50 to 55% 09/2016 who presents for follow-up  of her chronic systolic CHF   Last seen in clinic by myself 9/23 Seen by one of our providers March 2023  Sedentary, no exercise Does little PT for knee Denies any recent falls, overall feels she is doing well  Reports she has been taking Lasix daily for "weight gain" Previously taking Lasix every other day Weight up 7 pounds in 6 months Denies significant ankle swelling abdominal distention PND orthopnea Blood pressure well-controlled  Lab work reviewed A1C 6.3 Climbing BUN/creatinine on lab work January 2024 Total cholesterol 200  Tried statin per primary care, had side effects, had to stop the medication  CT scan 2108: mild to moderate PAD, aortic athero, particularly in iliac vessels  EKG personally reviewed by myself on todays visit Shows normal sinus rhythm with rate 69 bpm left axis deviation, no significant ST or T wave changes   PMH:   has a past medical history of Asthma, Chronic systolic CHF (congestive heart failure) ([place]), Colon cancer ([place]) (2005), Hypercholesterolemia, Hypertension, Hypertensive heart disease with CHF (congestive heart failure) ([place]), NICM (nonischemic cardiomyopathy) ([place]), NSVT (nonsustained ventricular tachycardia) ([place]), and Osteoporosis.  PSH:    Past Surgical History:  Procedure Laterality Date   ABDOMINAL HYSTERECTOMY     BREAST BIOPSY Left 4/15`   BENIGN BREAST EPITHELIUM WITH NODULAR FIBROSIS.   INGUINAL  HERNIA REPAIR Left 05/04/2016   Procedure: HERNIA REPAIR INGUINAL ADULT;  Surgeon: Robert Bellow, MD;  Location: [facility] ORS;  Service: General;  Laterality: Left;   THYROIDECTOMY, PARTIAL  1956   TUBAL LIGATION      Current Outpatient Medications  Medication Sig Dispense Refill   acetaminophen (TYLENOL) 325 MG tablet Take 650 mg by mouth every 6 (six) hours as needed.     albuterol (VENTOLIN HFA) 108 (90 Base) MCG/ACT inhaler Inhale 2 puffs into the lungs every 6 (six) hours as needed for wheezing or shortness of breath. 8.5 g 0   aspirin 81 MG EC tablet Take 81 mg by mouth daily. Swallow whole.     BREO ELLIPTA 100-25 MCG/ACT AEPB Inhale 1 puff into the lungs daily. 60 each 0   Cholecalciferol (VITAMIN D-3) 1000 UNITS CAPS Take 2,000 Units by mouth daily.     fluticasone (FLONASE) 50 MCG/ACT nasal spray Place 1 spray into both nostrils daily. 16 g 2   furosemide (LASIX) 40 MG tablet Take 1/2-1 tablet as directed per weight gain 90 tablet 1   isosorbide mononitrate (IMDUR) 30 MG 24 hr tablet Take 1 tablet (30 mg total) by mouth daily. 30 tablet 0   metoprolol succinate (TOPROL-XL) 25 MG 24 hr tablet Take 1 tablet (25 mg total) by mouth daily. 90 tablet 1   sacubitril-valsartan (ENTRESTO) 97-103 MG Take 1 tablet by mouth 2 (two) times daily. 180 tablet 1   senna-docusate (SENOKOT-S) 8.6-50 MG tablet Take 2 tablets by mouth 2 (two) times daily as  needed for mild constipation. 60 tablet 0   spironolactone (ALDACTONE) 25 MG tablet Take 1 tablet (25 mg total) by mouth daily. 90 tablet 1   Vibegron (GEMTESA) 75 MG TABS Take 75 mg by mouth daily. 90 tablet 3   No current facility-administered medications for this visit.    Allergies:   Patient has no known allergies.   Social History:  The patient  reports that she has never smoked. She has never used smokeless tobacco. She reports that she does not drink alcohol and does not use drugs.   Family History:   family history includes Breast cancer  in her cousin; Breast cancer (age of onset: 59) in her daughter; Colon cancer in her father; Stroke in her mother.   Review of Systems: Review of Systems  Constitutional: Negative.   Respiratory: Negative.    Cardiovascular: Negative.   Gastrointestinal: Negative.   Musculoskeletal:  Positive for joint pain.  Neurological: Negative.   Psychiatric/Behavioral: Negative.    All other systems reviewed and are negative.   PHYSICAL EXAM: VS:  BP 120/60 (BP Location: Left Arm, Patient Position: Sitting)   Pulse 69   Ht 5\' 2"  (1.575 m)   Wt 141 lb 6.4 oz (64.1 kg)   LMP 02/21/1966   BMI 25.86 kg/m  , BMI Body mass index is 25.86 kg/m. Constitutional:  oriented to person, place, and time. No distress.  HENT:  Head: Grossly normal Eyes:  no discharge. No scleral icterus.  Neck: No JVD, no carotid bruits  Cardiovascular: Regular rate and rhythm, no murmurs appreciated Pulmonary/Chest: Clear to auscultation bilaterally, no wheezes or rails Abdominal: Soft.  no distension.  no tenderness.  Musculoskeletal: Normal range of motion Neurological:  normal muscle tone. Coordination normal. No atrophy Skin: Skin warm and dry Psychiatric: normal affect, pleasant  Recent Labs: 11/24/2021: Hemoglobin 12.3; Platelets 221.0 02/24/2022: ALT 10; BUN 28; Creatinine, Ser 1.26; Potassium 4.8; Sodium 141   Lipid Panel Lab Results  Component Value Date   CHOL 229 (H) 09/17/2021   HDL 80.80 09/17/2021   LDLCALC 136 (H) 09/17/2021   TRIG 58.0 09/17/2021      Wt Readings from Last 3 Encounters:  04/20/22 141 lb 6.4 oz (64.1 kg)  02/24/22 140 lb 12.8 oz (63.9 kg)  01/11/22 141 lb 2 oz (64 kg)     ASSESSMENT AND PLAN:  Chronic systolic heart failure (HCC) Normalized ejection fraction 2019 Previously on Lasix every other day though she increase Lasix to daily for 7 pound weight gain in the past several months Feels her weight gain is from food not fluid Recommend she go back to Lasix every  other day given no leg swelling no abdominal distention No other medication changes made  Essential hypertension Blood pressure is well controlled on today's visit. No changes made to the medications.  Hypercholesterolemia statin myalgias on Crestor Consider Zetia  PAD/aortic atherosclerosis Mild to moderate in nature seen on CT scan 2018,   most notably in the distal aorta, iliac vessels Statin intolerance Could consider Zetia  Joint pain  stable   Total encounter time more than 30 minutes  Greater than 50% was spent in counseling and coordination of care with the patient    Orders Placed This Encounter  Procedures   EKG 12-Lead     Signed, Esmond Plants, M.D., Ph.D. 04/20/2022  [place], [place]

## 2022-04-20 ENCOUNTER — Encounter: Payer: Self-pay | Admitting: Cardiovascular Disease

## 2022-04-20 ENCOUNTER — Ambulatory Visit: Payer: Medicare HMO | Attending: Cardiovascular Disease | Admitting: Cardiovascular Disease

## 2022-04-20 VITALS — BP 120/60 | HR 69 | Ht 62.0 in | Wt 141.4 lb

## 2022-04-20 DIAGNOSIS — I110 Hypertensive heart disease with heart failure: Secondary | ICD-10-CM

## 2022-04-20 DIAGNOSIS — I700 Atherosclerosis of aorta: Secondary | ICD-10-CM | POA: Diagnosis not present

## 2022-04-20 DIAGNOSIS — E785 Hyperlipidemia, unspecified: Secondary | ICD-10-CM

## 2022-04-20 DIAGNOSIS — M791 Myalgia, unspecified site: Secondary | ICD-10-CM

## 2022-04-20 DIAGNOSIS — I5022 Chronic systolic (congestive) heart failure: Secondary | ICD-10-CM | POA: Diagnosis not present

## 2022-04-20 DIAGNOSIS — T466X5A Adverse effect of antihyperlipidemic and antiarteriosclerotic drugs, initial encounter: Secondary | ICD-10-CM

## 2022-04-20 DIAGNOSIS — I10 Essential (primary) hypertension: Secondary | ICD-10-CM

## 2022-04-20 DIAGNOSIS — I428 Other cardiomyopathies: Secondary | ICD-10-CM

## 2022-04-20 DIAGNOSIS — T466X5D Adverse effect of antihyperlipidemic and antiarteriosclerotic drugs, subsequent encounter: Secondary | ICD-10-CM

## 2022-04-20 DIAGNOSIS — I739 Peripheral vascular disease, unspecified: Secondary | ICD-10-CM

## 2022-04-20 MED ORDER — SPIRONOLACTONE 25 MG PO TABS: 25.0000 mg | ORAL_TABLET | Freq: Every day | ORAL | 1 refills | Status: DC

## 2022-04-20 MED ORDER — FUROSEMIDE 40 MG PO TABS: ORAL_TABLET | ORAL | 1 refills | Status: AC

## 2022-04-20 MED ORDER — ISOSORBIDE MONONITRATE ER 30 MG PO TB24: 30.0000 mg | ORAL_TABLET | Freq: Every day | ORAL | 0 refills | Status: DC

## 2022-04-20 MED ORDER — ENTRESTO 97-103 MG PO TABS: 1.0000 | ORAL_TABLET | Freq: Two times a day (BID) | ORAL | 1 refills | Status: DC

## 2022-04-20 NOTE — Patient Instructions (Signed)
Medication Instructions:  Lasix  every other day, with extra as needed for ankle swelling, ABD bloating   If you need a refill on your cardiac medications before your next appointment, please call your pharmacy.   Lab work: No new labs needed  Testing/Procedures: No new testing needed  Follow-Up: At [facility], you and your health needs are our priority.  As part of our continuing mission to provide you with exceptional heart care, we have created designated Provider Care Teams.  These Care Teams include your primary Cardiologist (physician) and Advanced Practice Providers (APPs -  Physician Assistants and Nurse Practitioners) who all work together to provide you with the care you need, when you need it.  You will need a follow up appointment in 12 months  Providers on your designated Care Team:   Murray Hodgkins, NP Christell Faith, PA-C Cadence Kathlen Mody, [place]  COVID-19 Vaccine Information can be found at: [email or web address] For questions related to vaccine distribution or appointments, please email [email or web address] or call [phone].

## 2022-05-17 ENCOUNTER — Ambulatory Visit: Payer: Medicare HMO | Admitting: Urology

## 2022-05-17 VITALS — BP 121/76 | HR 76 | Ht 62.0 in | Wt 141.0 lb

## 2022-05-17 DIAGNOSIS — N3941 Urge incontinence: Secondary | ICD-10-CM | POA: Diagnosis not present

## 2022-05-17 MED ORDER — GEMTESA 75 MG PO TABS: 75.0000 mg | ORAL_TABLET | Freq: Every day | ORAL | 11 refills | Status: DC

## 2022-05-17 NOTE — Addendum Note (Signed)
Addended byRanda Lynn on: 05/17/2022 04:13 PM   Modules accepted: Orders

## 2022-05-17 NOTE — Progress Notes (Signed)
05/17/2022 2:07 PM   Kathy Dominguez Sande Rives 1929-01-24 [id number]  Referring provider: Dale [place], MD [address] [place],  [place] [address]  Chief Complaint  Patient presents with   Follow-up    HPI: I was consulted to assist the patient's urgency incontinence.  She can have foot on the floor syndrome.  I think she wears 1 or 2 pads a day but it was difficult to quantitate.  No stress incontinence or bedwetting   She voids approximately every hour and every 2 hours at night   She has had a TIA and a hysterectomy     Modest improvement with Myrbetriq but still having urgency and some leaking.     Very narrow introitus with no prolapse or stress incontinence   Cystoscopy: Normal   Patient was a partial responder to Myrbetriq. Reassess in 6 weeks on Gemtesa samples and prescription and proceed accordingly. Percutaneous tibial nerve stimulation likely a very good option and we can always go back on the Myrbetriq    Urge incontinence and less urgency.  Clinically not infected    Reassess in 4 months on Gemtesa.  He is about age and side effects etc. think that is a good choice for her.  We can always talk about percutaneous tibial nerve stimulation in the future and/or antimuscarinics   Today Urgency incontinence dramatically better.  No infections.  Frequency stable.  She wants to add on Gemtesa   PMH: Past Medical History:  Diagnosis Date   Asthma    Chronic systolic CHF (congestive heart failure) (HCC)    a. 01/2015 Echo: EF 25-30%, sev diff HK, mild to mod MR, mildly dil LA, nl RV fxn, PASP 61 mmHg.   Colon cancer (HCC) 2005   Hypercholesterolemia    Hypertension    Hypertensive heart disease with CHF (congestive heart failure) (HCC)    NICM (nonischemic cardiomyopathy) (HCC)    a. 01/2015 Echo: EF 25-30% sev diff HK;  b. 01/2015 Lexi MV: EF 19%, small defect of mild severity in apex - likely breast attenuation, no ischemia.   NSVT  (nonsustained ventricular tachycardia) (HCC)    Osteoporosis     Surgical History: Past Surgical History:  Procedure Laterality Date   ABDOMINAL HYSTERECTOMY     BREAST BIOPSY Left 4/15`   BENIGN BREAST EPITHELIUM WITH NODULAR FIBROSIS.   INGUINAL HERNIA REPAIR Left 05/04/2016   Procedure: HERNIA REPAIR INGUINAL ADULT;  Surgeon: Earline Mayotte, MD;  Location: [facility] ORS;  Service: General;  Laterality: Left;   THYROIDECTOMY, PARTIAL  1956   TUBAL LIGATION      Home Medications:  Allergies as of 05/17/2022   No Known Allergies      Medication List        Accurate as of May 17, 2022  2:07 PM. If you have any questions, ask your nurse or doctor.          acetaminophen 325 MG tablet Commonly known as: TYLENOL Take 650 mg by mouth every 6 (six) hours as needed.   albuterol 108 (90 Base) MCG/ACT inhaler Commonly known as: VENTOLIN HFA Inhale 2 puffs into the lungs every 6 (six) hours as needed for wheezing or shortness of breath.   aspirin EC 81 MG tablet Take 81 mg by mouth daily. Swallow whole.   Breo Ellipta 100-25 MCG/ACT Aepb Generic drug: fluticasone furoate-vilanterol Inhale 1 puff into the lungs daily.   Entresto 97-103 MG Generic drug: sacubitril-valsartan Take 1 tablet by mouth 2 (two) times  daily.   fluticasone 50 MCG/ACT nasal spray Commonly known as: FLONASE Place 1 spray into both nostrils daily.   furosemide 40 MG tablet Commonly known as: LASIX Take 1/2-1 tablet as directed per weight gain   Gemtesa 75 MG Tabs Generic drug: Vibegron Take 75 mg by mouth daily.   isosorbide mononitrate 30 MG 24 hr tablet Commonly known as: IMDUR Take 1 tablet (30 mg total) by mouth daily.   metoprolol succinate 25 MG 24 hr tablet Commonly known as: TOPROL-XL Take 1 tablet (25 mg total) by mouth daily.   senna-docusate 8.6-50 MG tablet Commonly known as: Senokot-S Take 2 tablets by mouth 2 (two) times daily as needed for mild constipation.    spironolactone 25 MG tablet Commonly known as: ALDACTONE Take 1 tablet (25 mg total) by mouth daily.   Vitamin D-3 25 MCG (1000 UT) Caps Take 2,000 Units by mouth daily.        Allergies: No Known Allergies  Family History: Family History  Problem Relation Age of Onset   Stroke Mother    Colon cancer Father    Breast cancer Daughter 71   Breast cancer Cousin     Social History:  reports that she has never smoked. She has never used smokeless tobacco. She reports that she does not drink alcohol and does not use drugs.  ROS:                                        Physical Exam: LMP 02/21/1966   Constitutional:  Alert and oriented, No acute distress. HEENT: [place] AT, moist mucus membranes.  Trachea midline, no masses.   Laboratory Data: Lab Results  Component Value Date   WBC 4.5 11/24/2021   HGB 12.3 11/24/2021   HCT 37.5 11/24/2021   MCV 91.6 11/24/2021   PLT 221.0 11/24/2021    Lab Results  Component Value Date   CREATININE 1.26 (H) 02/24/2022    No results found for: "PSA"  No results found for: "TESTOSTERONE"  Lab Results  Component Value Date   HGBA1C 6.3 02/24/2022    Urinalysis    Component Value Date/Time   COLORURINE YELLOW (A) 06/28/2019 0813   APPEARANCEUR Clear 01/11/2022 1416   LABSPEC 1.041 (H) 06/28/2019 0813   PHURINE 6.0 06/28/2019 0813   GLUCOSEU Negative 01/11/2022 1416   GLUCOSEU NEGATIVE 02/20/2015 0945   HGBUR NEGATIVE 06/28/2019 0813   BILIRUBINUR Negative 01/11/2022 1416   KETONESUR 5 (A) 06/28/2019 0813   PROTEINUR Negative 01/11/2022 1416   PROTEINUR NEGATIVE 06/28/2019 0813   UROBILINOGEN 0.2 02/20/2015 0945   NITRITE Negative 01/11/2022 1416   NITRITE NEGATIVE 06/28/2019 0813   LEUKOCYTESUR Negative 01/11/2022 1416   LEUKOCYTESUR LARGE (A) 06/28/2019 0813    Pertinent Imaging:   Assessment & Plan: 30 x 11 sent to pharmacy and I will see in 1 year  1. Urge incontinence of urine  -  Urinalysis, Complete   No follow-ups on file.  Martina Sinner, MD  [facility] [address] [place], [place] [address] [phone]

## 2022-06-22 ENCOUNTER — Other Ambulatory Visit: Payer: Self-pay | Admitting: Pulmonary Disease

## 2022-06-22 ENCOUNTER — Other Ambulatory Visit: Payer: Self-pay | Admitting: Cardiovascular Disease

## 2022-06-28 ENCOUNTER — Encounter: Payer: Self-pay | Admitting: Internal Medicine

## 2022-06-28 ENCOUNTER — Ambulatory Visit: Payer: Medicare HMO | Admitting: Internal Medicine

## 2022-06-28 VITALS — BP 126/74 | HR 71 | Temp 97.9°F | Resp 16 | Ht 63.0 in | Wt 133.8 lb

## 2022-06-28 DIAGNOSIS — I10 Essential (primary) hypertension: Secondary | ICD-10-CM | POA: Diagnosis not present

## 2022-06-28 DIAGNOSIS — I700 Atherosclerosis of aorta: Secondary | ICD-10-CM | POA: Diagnosis not present

## 2022-06-28 DIAGNOSIS — I428 Other cardiomyopathies: Secondary | ICD-10-CM

## 2022-06-28 DIAGNOSIS — I4729 Other ventricular tachycardia: Secondary | ICD-10-CM

## 2022-06-28 DIAGNOSIS — E7800 Pure hypercholesterolemia, unspecified: Secondary | ICD-10-CM | POA: Diagnosis not present

## 2022-06-28 DIAGNOSIS — Z85038 Personal history of other malignant neoplasm of large intestine: Secondary | ICD-10-CM

## 2022-06-28 DIAGNOSIS — I5022 Chronic systolic (congestive) heart failure: Secondary | ICD-10-CM

## 2022-06-28 DIAGNOSIS — R739 Hyperglycemia, unspecified: Secondary | ICD-10-CM

## 2022-06-28 DIAGNOSIS — M25561 Pain in right knee: Secondary | ICD-10-CM

## 2022-06-28 DIAGNOSIS — I5042 Chronic combined systolic (congestive) and diastolic (congestive) heart failure: Secondary | ICD-10-CM

## 2022-06-28 DIAGNOSIS — I110 Hypertensive heart disease with heart failure: Secondary | ICD-10-CM

## 2022-06-28 DIAGNOSIS — J4520 Mild intermittent asthma, uncomplicated: Secondary | ICD-10-CM

## 2022-06-28 DIAGNOSIS — N1832 Chronic kidney disease, stage 3b: Secondary | ICD-10-CM

## 2022-06-28 LAB — BASIC METABOLIC PANEL
BUN: 30 mg/dL — ABNORMAL HIGH (ref 6–23)
CO2: 32 mEq/L (ref 19–32)
Calcium: 9.6 mg/dL (ref 8.4–10.5)
Chloride: 99 mEq/L (ref 96–112)
Creatinine, Ser: 1.46 mg/dL — ABNORMAL HIGH (ref 0.40–1.20)
GFR: 31.1 mL/min — ABNORMAL LOW (ref >60.00)
Glucose, Bld: 95 mg/dL (ref 70–99)
Potassium: 4.9 mEq/L (ref 3.5–5.1)
Sodium: 140 mEq/L (ref 135–145)

## 2022-06-28 LAB — LIPID PANEL
Cholesterol: 196 mg/dL (ref 0–200)
HDL: 73.8 mg/dL (ref >39.00)
LDL Cholesterol: 112 mg/dL — ABNORMAL HIGH (ref 0–99)
NonHDL: 122.57
Total CHOL/HDL Ratio: 3
Triglycerides: 55 mg/dL (ref 0.0–149.0)
VLDL: 11 mg/dL (ref 0.0–40.0)

## 2022-06-28 LAB — HEPATIC FUNCTION PANEL
ALT: 9 U/L (ref 0–35)
AST: 18 U/L (ref 0–37)
Albumin: 4.3 g/dL (ref 3.5–5.2)
Alkaline Phosphatase: 65 U/L (ref 39–117)
Bilirubin, Direct: 0.1 mg/dL (ref 0.0–0.3)
Total Bilirubin: 0.4 mg/dL (ref 0.2–1.2)
Total Protein: 7.4 g/dL (ref 6.0–8.3)

## 2022-06-28 LAB — HEMOGLOBIN A1C: Hgb A1c MFr Bld: 6.2 % (ref 4.6–6.5)

## 2022-06-28 LAB — TSH: TSH: 0.61 u[IU]/mL (ref 0.35–5.50)

## 2022-06-28 NOTE — Progress Notes (Signed)
Subjective:    Patient ID: Tally Due, female    DOB: 05/20/1928, 89 y.o.   MRN: [id number]  Patient here for  Chief Complaint  Patient presents with   Medical Management of Chronic Issues    HPI Here to follow up regarding CHF, hypertension and hypercholesterolemia.  Saw Dr Craige Cotta - 09/24/21 - continue breo and prn albuterol. Breathing stable. Saw Dr Sherron Monday - urge incontinence - has tried myrbetriq. Last seen - 05/17/22 - after trial of gemtesa. Per note, symptoms improved. Saw Dr Mariah Milling 04/20/22 - lasix qod and entrestro.  Statin myopathy.  Consider zetia. Discussed with her today.  No chest pain.  No increased cough or congestion.  No abdominal pain or bowel change reported.  Some pain - right knee.  Does not limit her activity.  States has been told - arthritis.   Desires no further intervention at this time.    Past Medical History:  Diagnosis Date   Asthma    Chronic systolic CHF (congestive heart failure) (HCC)    a. 01/2015 Echo: EF 25-30%, sev diff HK, mild to mod MR, mildly dil LA, nl RV fxn, PASP 61 mmHg.   Colon cancer (HCC) 2005   Hypercholesterolemia    Hypertension    Hypertensive heart disease with CHF (congestive heart failure) (HCC)    NICM (nonischemic cardiomyopathy) (HCC)    a. 01/2015 Echo: EF 25-30% sev diff HK;  b. 01/2015 Lexi MV: EF 19%, small defect of mild severity in apex - likely breast attenuation, no ischemia.   NSVT (nonsustained ventricular tachycardia) (HCC)    Osteoporosis    Past Surgical History:  Procedure Laterality Date   ABDOMINAL HYSTERECTOMY     BREAST BIOPSY Left 4/15`   BENIGN BREAST EPITHELIUM WITH NODULAR FIBROSIS.   INGUINAL HERNIA REPAIR Left 05/04/2016   Procedure: HERNIA REPAIR INGUINAL ADULT;  Surgeon: Earline Mayotte, MD;  Location: [facility] ORS;  Service: General;  Laterality: Left;   THYROIDECTOMY, PARTIAL  1956   TUBAL LIGATION     Family History  Problem Relation Age of Onset   Stroke Mother    Colon cancer Father     Breast cancer Daughter 4   Breast cancer Cousin    Social History   Socioeconomic History   Marital status: Widowed    Spouse name: Not on file   Number of children: Not on file   Years of education: Not on file   Highest education level: Bachelor's degree (e.g., BA, AB, BS)  Occupational History   Not on file  Tobacco Use   Smoking status: Never   Smokeless tobacco: Never  Vaping Use   Vaping Use: Never used  Substance and Sexual Activity   Alcohol use: No    Alcohol/week: 0.0 standard drinks of alcohol   Drug use: No   Sexual activity: Not on file  Other Topics Concern   Not on file  Social History Narrative   Not on file   Social Determinants of Health   Financial Resource Strain: Low Risk  (06/27/2022)   Overall Financial Resource Strain (CARDIA)    Difficulty of Paying Living Expenses: Not very hard  Food Insecurity: No Food Insecurity (06/27/2022)   Hunger Vital Sign    Worried About Running Out of Food in the Last Year: Never true    Ran Out of Food in the Last Year: Never true  Transportation Needs: No Transportation Needs (06/27/2022)   PRAPARE - Administrator, Civil Service (Medical):  No    Lack of Transportation (Non-Medical): No  Physical Activity: Inactive (06/27/2022)   Exercise Vital Sign    Days of Exercise per Week: 0 days    Minutes of Exercise per Session: 20 min  Stress: No Stress Concern Present (06/27/2022)   [employer] of Occupational Health - Occupational Stress Questionnaire    Feeling of Stress : Not at all  Social Connections: Moderately Isolated (06/27/2022)   Social Connection and Isolation Panel [NHANES]    Frequency of Communication with Friends and Family: More than three times a week    Frequency of Social Gatherings with Friends and Family: Once a week    Attends Religious Services: More than 4 times per year    Active Member of [employer] or Organizations: No    Attends Banker Meetings: Not on file     Marital Status: Widowed     Review of Systems  Constitutional:  Negative for appetite change and unexpected weight change.  HENT:  Negative for congestion and sinus pressure.   Respiratory:  Negative for chest tightness.        Breathing stable.  No increased cough or congestion.   Cardiovascular:  Negative for chest pain, palpitations and leg swelling.  Gastrointestinal:  Negative for abdominal pain, diarrhea, nausea and vomiting.  Genitourinary:  Negative for difficulty urinating and dysuria.  Musculoskeletal:  Negative for myalgias.       Knee pain as outlined.   Skin:  Negative for color change and rash.  Neurological:  Negative for dizziness, light-headedness and headaches.  Psychiatric/Behavioral:  Negative for agitation and dysphoric mood.        Objective:     BP 126/74   Pulse 71   Temp 97.9 F (36.6 C)   Resp 16   Ht 5\' 3"  (1.6 m)   Wt 133 lb 12.8 oz (60.7 kg)   LMP 02/21/1966   SpO2 97%   BMI 23.70 kg/m  Wt Readings from Last 3 Encounters:  06/28/22 133 lb 12.8 oz (60.7 kg)  05/17/22 141 lb (64 kg)  04/20/22 141 lb 6.4 oz (64.1 kg)    Physical Exam Vitals reviewed.  Constitutional:      General: She is not in acute distress.    Appearance: Normal appearance.  HENT:     Head: Normocephalic and atraumatic.     Right Ear: External ear normal.     Left Ear: External ear normal.  Eyes:     General: No scleral icterus.       Right eye: No discharge.        Left eye: No discharge.     Conjunctiva/sclera: Conjunctivae normal.  Neck:     Thyroid: No thyromegaly.  Cardiovascular:     Rate and Rhythm: Normal rate and regular rhythm.  Pulmonary:     Effort: No respiratory distress.     Breath sounds: Normal breath sounds. No wheezing.  Abdominal:     General: Bowel sounds are normal.     Palpations: Abdomen is soft.     Tenderness: There is no abdominal tenderness.  Musculoskeletal:        General: No swelling or tenderness.     Cervical back: Neck  supple. No tenderness.  Lymphadenopathy:     Cervical: No cervical adenopathy.  Skin:    Findings: No erythema or rash.  Neurological:     Mental Status: She is alert.  Psychiatric:        Mood and Affect: Mood normal.  Behavior: Behavior normal.      Outpatient Encounter Medications as of 06/28/2022  Medication Sig   acetaminophen (TYLENOL) 325 MG tablet Take 650 mg by mouth every 6 (six) hours as needed.   albuterol (VENTOLIN HFA) 108 (90 Base) MCG/ACT inhaler Inhale 2 puffs into the lungs every 6 (six) hours as needed for wheezing or shortness of breath.   aspirin 81 MG EC tablet Take 81 mg by mouth daily. Swallow whole.   Cholecalciferol (VITAMIN D-3) 1000 UNITS CAPS Take 2,000 Units by mouth daily.   fluticasone (FLONASE) 50 MCG/ACT nasal spray Place 1 spray into both nostrils daily.   fluticasone furoate-vilanterol (BREO ELLIPTA) 100-25 MCG/ACT AEPB Inhale 1 puff into the lungs daily.   furosemide (LASIX) 40 MG tablet Take 1/2-1 tablet as directed per weight gain   isosorbide mononitrate (IMDUR) 30 MG 24 hr tablet Take 1 tablet (30 mg total) by mouth daily.   metoprolol succinate (TOPROL-XL) 25 MG 24 hr tablet Take 1 tablet (25 mg total) by mouth daily.   sacubitril-valsartan (ENTRESTO) 97-103 MG Take 1 tablet by mouth 2 (two) times daily.   senna-docusate (SENOKOT-S) 8.6-50 MG tablet Take 2 tablets by mouth 2 (two) times daily as needed for mild constipation.   spironolactone (ALDACTONE) 25 MG tablet Take 1 tablet (25 mg total) by mouth daily.   Vibegron (GEMTESA) 75 MG TABS Take 1 tablet (75 mg total) by mouth daily.   No facility-administered encounter medications on file as of 06/28/2022.     Lab Results  Component Value Date   WBC 4.5 11/24/2021   HGB 12.3 11/24/2021   HCT 37.5 11/24/2021   PLT 221.0 11/24/2021   GLUCOSE 95 06/28/2022   CHOL 196 06/28/2022   TRIG 55.0 06/28/2022   HDL 73.80 06/28/2022   LDLDIRECT 128.3 02/08/2013   LDLCALC 112 (H) 06/28/2022    ALT 9 06/28/2022   AST 18 06/28/2022   NA 140 06/28/2022   K 4.9 06/28/2022   CL 99 06/28/2022   CREATININE 1.46 (H) 06/28/2022   BUN 30 (H) 06/28/2022   CO2 32 06/28/2022   TSH 0.61 06/28/2022   INR 0.9 06/28/2019   HGBA1C 6.2 06/28/2022    MM 3D SCREEN BREAST BILATERAL  Result Date: 12/03/2021 CLINICAL DATA:  Screening. EXAM: DIGITAL SCREENING BILATERAL MAMMOGRAM WITH TOMOSYNTHESIS AND CAD TECHNIQUE: Bilateral screening digital craniocaudal and mediolateral oblique mammograms were obtained. Bilateral screening digital breast tomosynthesis was performed. The images were evaluated with computer-aided detection. COMPARISON:  Previous exam(s). ACR Breast Density Category c: The breast tissue is heterogeneously dense, which may obscure small masses. FINDINGS: There are no findings suspicious for malignancy. IMPRESSION: No mammographic evidence of malignancy. A result letter of this screening mammogram will be mailed directly to the patient. RECOMMENDATION: Screening mammogram in one year. (Code:SM-B-01Y) BI-RADS CATEGORY  1: Negative. Electronically Signed   By: Bary Richard M.D.   On: 12/03/2021 13:55   DG Bone Density  Result Date: 12/02/2021 EXAM: DUAL X-RAY ABSORPTIOMETRY (DXA) FOR BONE MINERAL DENSITY IMPRESSION: Your patient Azani Brogdon completed a BMD test on 12/02/2021 using the [employer] DXA System (software version: 14.10) manufactured by [employer]. The following summarizes the results of our evaluation. Technologist: SCE PATIENT BIOGRAPHICAL: Name: Keitra, Carusone Patient ID: [id number] Birth Date: Nov 29, 1928 Height: 62.0 in. Gender: Female Exam Date: 12/02/2021 Weight: 139.3 lbs. Indications: Advanced Age, Asthma, Height Loss, Hysterectomy, Osteoarthritis, Postmenopausal, Vitamin D Deficiency, History of Fracture (Adult) Fractures: Foot Right Treatments: Vitamin D DENSITOMETRY RESULTS: Site  Region      Measured Date Measured Age WHO Classification Young Adult  T-score BMD         %Change vs. Previous Significant Change (*) AP Spine L1-L2 12/02/2023 91.7 Osteopenia -1.1 1.040 g/cm2 - - DualFemur Total Right 12/02/2021 91.7 Osteopenia -1.5 0.824 g/cm2 - - ASSESSMENT: The BMD measured at Femur Total Right is 0.824 g/cm2 with a T-score of -1.5. This patient is considered osteopenic according to World Health Organization [facility]) criteria. L3 and L4 was excluded due to degenerative changes. Patient is not a candidate for FRAX due to age. The scan quality is good. World Science writer [facility]) criteria for post-menopausal, Caucasian Women: Normal:                   T-score at or above -1 SD Osteopenia/low bone mass: T-score between -1 and -2.5 SD Osteoporosis:             T-score at or below -2.5 SD RECOMMENDATIONS: 1. All patients should optimize calcium and vitamin D intake. 2. Consider FDA-approved medical therapies in postmenopausal women and men aged 20 years and older, based on the following: a. A hip or vertebral(clinical or morphometric) fracture b. T-score < -2.5 at the femoral neck or spine after appropriate evaluation to exclude secondary causes c. Low bone mass (T-score between -1.0 and -2.5 at the femoral neck or spine) and a 10-year probability of a hip fracture > 3% or a 10-year probability of a major osteoporosis-related fracture > 20% based on the [place]-adapted WHO algorithm 3. Clinician judgment and/or patient preferences may indicate treatment for people with 10-year fracture probabilities above or below these levels FOLLOW-UP: People with diagnosed cases of osteoporosis or at high risk for fracture should have regular bone mineral density tests. For patients eligible for Medicare, routine testing is allowed once every 2 years. The testing frequency can be increased to one year for patients who have rapidly progressing disease, those who are receiving or discontinuing medical therapy to restore bone mass, or have additional risk factors. I have reviewed this  report, and agree with the above findings. [facility] Electronically Signed   By: Frederico Hamman M.D.   On: 12/02/2021 12:17       Assessment & Plan:  Hypercholesterolemia Assessment & Plan: Was on crestor. Intolerance.  Off now.  Feels better off. Discussed zetia. Follow lipid panel. Check today.   Orders: -     Hepatic function panel -     Lipid panel -     TSH  Essential hypertension Assessment & Plan: Continue aldactone, entresto, metoprolol and lasix.  Follow pressures. Follow metabolic panel.   Orders: -     Basic metabolic panel  Chronic systolic heart failure (HCC) Assessment & Plan: Taking metoprolol, spironolactone, entrestro and imdur.  Has lasix to take if needed.  Weight has been stable.  Follow. No evidence of volume overload on exam today.     Hyperglycemia Assessment & Plan: Follow met b and a1c.   Orders: -     Hemoglobin A1c  Aortic atherosclerosis (HCC) Assessment & Plan: Off cholesterol medication.  Statin intolerance.  Discussed zetia.     Mild intermittent asthma without complication Assessment & Plan: Breathing stable.  Follow.    Stage 3b chronic kidney disease (HCC) Assessment & Plan: Avoid antiinflammatories.  Follow metabolic panel.    History of colon cancer Assessment & Plan: Recently evaluated by oncology for increase CEA.  PET ok.  Follow.    Hypertensive  heart disease with chronic combined systolic and diastolic congestive heart failure (HCC) Assessment & Plan: No evidence of volume overload on exam.  Continue entrestro, aldactone and metoprolol.  Has lasix if needed. Follow.    NICM (nonischemic cardiomyopathy) (HCC) Assessment & Plan: Taking metoprolol, spironolactone, entrestro and imdur.  Has lasix to take if needed.  Weight has been stable.  Follow.    NSVT (nonsustained ventricular tachycardia) (HCC) Assessment & Plan: Documented per cardiology. On metoprolol. Stable.    Right knee pain,  unspecified chronicity Assessment & Plan: Evaluated by ortho. S/p injection.  Did not feel helped.  Desires no further intervention.  Follow.       Dale Pinecrest, MD

## 2022-06-29 ENCOUNTER — Telehealth: Payer: Self-pay

## 2022-06-29 DIAGNOSIS — R944 Abnormal results of kidney function studies: Secondary | ICD-10-CM

## 2022-06-29 MED ORDER — EZETIMIBE 10 MG PO TABS: 10.0000 mg | ORAL_TABLET | Freq: Every day | ORAL | 1 refills | Status: DC

## 2022-06-29 NOTE — Telephone Encounter (Signed)
Zetia sent to [facility]. BMP ordered.

## 2022-06-29 NOTE — Telephone Encounter (Signed)
-----   Message from Dale Venango, MD sent at 06/29/2022  4:56 AM EDT ----- Notify Kathy Dominguez that her overall sugar control is stable. Thyroid test is wnl. Her cholesterol has improved.  Is elevated above goal,  I had talked with her about starting a cholesterol medication called zetia (given previous problems with statin medication). If agreeable, start zetia 10mg  q day.  Kidney function decreased some from previous check, but has varied.  Continue to avoid antiinflammatory medication.  We will follow.  Recheck met b in 2-3 weeks.  Liver panel wnl.

## 2022-06-29 NOTE — Telephone Encounter (Signed)
Patient states she is returning a call from Rita Ohara, LPN.  I read Dr. Westley Hummer Scott's message to patient.  Patient states she is willing to take zetia.  Patient states her preferred pharmacy is [facility] in [place].

## 2022-06-29 NOTE — Addendum Note (Signed)
Addended by: Rita Ohara D on: 06/29/2022 12:17 PM   Modules accepted: Orders

## 2022-07-04 ENCOUNTER — Encounter: Payer: Self-pay | Admitting: Internal Medicine

## 2022-07-04 NOTE — Assessment & Plan Note (Addendum)
Taking metoprolol, spironolactone, entrestro and imdur.  Has lasix to take if needed.  Weight has been stable.  Follow. No evidence of volume overload on exam today.

## 2022-07-04 NOTE — Assessment & Plan Note (Signed)
Follow met b and a1c.  

## 2022-07-04 NOTE — Assessment & Plan Note (Signed)
Avoid antiinflammatories.  Follow metabolic panel.  

## 2022-07-04 NOTE — Assessment & Plan Note (Signed)
Was on crestor. Intolerance.  Off now.  Feels better off. Discussed zetia. Follow lipid panel. Check today.

## 2022-07-04 NOTE — Assessment & Plan Note (Addendum)
No evidence of volume overload on exam.  Continue entrestro, aldactone and metoprolol.  Has lasix if needed. Follow.

## 2022-07-04 NOTE — Assessment & Plan Note (Signed)
Evaluated by ortho. S/p injection.  Did not feel helped.  Desires no further intervention.  Follow.

## 2022-07-04 NOTE — Assessment & Plan Note (Signed)
Taking metoprolol, spironolactone, entrestro and imdur.  Has lasix to take if needed.  Weight has been stable.  Follow.  

## 2022-07-04 NOTE — Assessment & Plan Note (Signed)
Documented per cardiology. On metoprolol. Stable.  

## 2022-07-04 NOTE — Assessment & Plan Note (Signed)
Breathing stable.  Follow.    

## 2022-07-04 NOTE — Assessment & Plan Note (Signed)
Off cholesterol medication.  Statin intolerance.  Discussed zetia.

## 2022-07-04 NOTE — Assessment & Plan Note (Signed)
Continue aldactone, entresto, metoprolol and lasix.  Follow pressures.  Follow metabolic panel.  

## 2022-07-04 NOTE — Assessment & Plan Note (Signed)
Recently evaluated by oncology for increase CEA.  PET ok.  Follow.  

## 2022-07-20 ENCOUNTER — Other Ambulatory Visit (INDEPENDENT_AMBULATORY_CARE_PROVIDER_SITE_OTHER): Payer: Medicare HMO

## 2022-07-20 DIAGNOSIS — R944 Abnormal results of kidney function studies: Secondary | ICD-10-CM

## 2022-07-20 LAB — BASIC METABOLIC PANEL
BUN: 27 mg/dL — ABNORMAL HIGH (ref 6–23)
CO2: 32 mEq/L (ref 19–32)
Calcium: 9.7 mg/dL (ref 8.4–10.5)
Chloride: 103 mEq/L (ref 96–112)
Creatinine, Ser: 1.38 mg/dL — ABNORMAL HIGH (ref 0.40–1.20)
GFR: 33.26 mL/min — ABNORMAL LOW (ref >60.00)
Glucose, Bld: 110 mg/dL — ABNORMAL HIGH (ref 70–99)
Potassium: 4.4 mEq/L (ref 3.5–5.1)
Sodium: 141 mEq/L (ref 135–145)

## 2022-08-04 ENCOUNTER — Encounter: Payer: Self-pay | Admitting: Nurse Practitioner

## 2022-08-04 ENCOUNTER — Ambulatory Visit: Payer: Medicare HMO | Admitting: Nurse Practitioner

## 2022-08-04 VITALS — BP 124/74 | HR 80 | Temp 97.8°F | Ht 63.0 in | Wt 133.4 lb

## 2022-08-04 DIAGNOSIS — J4489 Other specified chronic obstructive pulmonary disease: Secondary | ICD-10-CM

## 2022-08-04 MED ORDER — FLUTICASONE FUROATE-VILANTEROL 100-25 MCG/ACT IN AEPB: 1.0000 | INHALATION_SPRAY | Freq: Every day | RESPIRATORY_TRACT | 5 refills | Status: DC

## 2022-08-04 MED ORDER — ALBUTEROL SULFATE HFA 108 (90 BASE) MCG/ACT IN AERS
2.0000 | INHALATION_SPRAY | Freq: Four times a day (QID) | RESPIRATORY_TRACT | 2 refills | Status: DC | PRN
Start: 2022-08-04 — End: 2023-08-30

## 2022-08-04 NOTE — Patient Instructions (Addendum)
Continue Albuterol inhaler 2 puffs every 6 hours as needed for shortness of breath or wheezing. Notify if symptoms persist despite rescue inhaler/neb use.  Continue flonase nasal spray 2 sprays each nostril daily Continue Breo 1 puff daily. Brush tongue and rinse mouth afterwards  Follow up in 6 months with Dr. Jayme Cloud or Katie Rheanna Sergent,NP. If symptoms worsen, please contact office for sooner follow up or seek emergency care.

## 2022-08-04 NOTE — Progress Notes (Signed)
@Patient  ID: Kathy Dominguez, female    DOB: 25-Oct-1928, 89 y.o.   MRN: [id number]  Chief Complaint  Patient presents with   Follow-up    SOB with exertion.     Referring provider: Dale Carmichaels, MD  HPI: 89 year old female, former smoker followed for COPD and asthma. She is a patient of Dr. Evlyn Courier and last seen in office 09/25/2022. Past medical history significant for HTN, CHF, NSVT, nonischemic cardiomyopathy, CKD, HLD.   TEST/EVENTS:  11/23/2019 CT chest: 5 mm nodule right apex stable since 2008 09/15/2021 PFT: FVC 76, FEV1 73, ratio 55, TLC 126. Moderately severe obstructive disease without reversibility   09/24/2021: OV with Dr. Craige Cotta. Breathing and cough are better. Not having sinus congestion. Virgel Bouquet helps her. Not needing to use albuterol much. Keeps up with activity.   08/04/2022: Today - follow up Patient presents today for follow up. She has been doing well since she was here last. She has not had any flare ups requiring abx/steroids or hospitalizations. She does well from an activity standpoint. Lives by herself and is independent. Her breathing does not limit her activity. She feels like her Virgel Bouquet helps. She denies any wheezing, cough, chest congestion. No concerns or complaints today. Uses albuterol a few times a month.   No Known Allergies  Immunization History  Administered Date(s) Administered   Fluad Quad(high Dose 65+) 12/06/2018   Influenza, High Dose Seasonal PF 12/04/2015, 11/23/2016, 11/21/2017, 10/21/2020   Influenza,inj,Quad PF,6+ Mos 02/20/2015   Influenza-Unspecified 10/20/2021   PFIZER(Purple Top)SARS-COV-2 Vaccination 01/31/2019, 02/21/2019, 10/21/2019, 11/04/2020   Pneumococcal Conjugate-13 12/19/2017   Pneumococcal-Unspecified 01/26/2003   Rsv, Bivalent, Protein Subunit Rsvpref,pf Verdis Frederickson) 11/09/2021   Zoster Recombinant(Shingrix) 03/10/2018, 06/23/2018    Past Medical History:  Diagnosis Date   Asthma    Chronic systolic CHF (congestive heart  failure) (HCC)    a. 01/2015 Echo: EF 25-30%, sev diff HK, mild to mod MR, mildly dil LA, nl RV fxn, PASP 61 mmHg.   Colon cancer (HCC) 2005   Hypercholesterolemia    Hypertension    Hypertensive heart disease with CHF (congestive heart failure) (HCC)    NICM (nonischemic cardiomyopathy) (HCC)    a. 01/2015 Echo: EF 25-30% sev diff HK;  b. 01/2015 Lexi MV: EF 19%, small defect of mild severity in apex - likely breast attenuation, no ischemia.   NSVT (nonsustained ventricular tachycardia) (HCC)    Osteoporosis     Tobacco History: Social History   Tobacco Use  Smoking Status Never  Smokeless Tobacco Never   Counseling given: Not Answered   Outpatient Medications Prior to Visit  Medication Sig Dispense Refill   acetaminophen (TYLENOL) 325 MG tablet Take 650 mg by mouth every 6 (six) hours as needed.     aspirin 81 MG EC tablet Take 81 mg by mouth daily. Swallow whole.     Cholecalciferol (VITAMIN D-3) 1000 UNITS CAPS Take 2,000 Units by mouth daily.     fluticasone (FLONASE) 50 MCG/ACT nasal spray Place 1 spray into both nostrils daily. 16 g 2   furosemide (LASIX) 40 MG tablet Take 1/2-1 tablet as directed per weight gain 90 tablet 1   isosorbide mononitrate (IMDUR) 30 MG 24 hr tablet Take 1 tablet (30 mg total) by mouth daily. 30 tablet 8   metoprolol succinate (TOPROL-XL) 25 MG 24 hr tablet Take 1 tablet (25 mg total) by mouth daily. 90 tablet 1   sacubitril-valsartan (ENTRESTO) 97-103 MG Take 1 tablet by mouth 2 (two) times daily.  180 tablet 1   senna-docusate (SENOKOT-S) 8.6-50 MG tablet Take 2 tablets by mouth 2 (two) times daily as needed for mild constipation. 60 tablet 0   spironolactone (ALDACTONE) 25 MG tablet Take 1 tablet (25 mg total) by mouth daily. 90 tablet 1   Vibegron (GEMTESA) 75 MG TABS Take 1 tablet (75 mg total) by mouth daily. 30 tablet 11   albuterol (VENTOLIN HFA) 108 (90 Base) MCG/ACT inhaler Inhale 2 puffs into the lungs every 6 (six) hours as needed for  wheezing or shortness of breath. 8.5 g 0   fluticasone furoate-vilanterol (BREO ELLIPTA) 100-25 MCG/ACT AEPB Inhale 1 puff into the lungs daily. 60 each 2   ezetimibe (ZETIA) 10 MG tablet Take 1 tablet (10 mg total) by mouth daily. (Patient not taking: Reported on 08/04/2022) 90 tablet 1   No facility-administered medications prior to visit.     Review of Systems:   Constitutional: No weight loss or gain, night sweats, fevers, chills, fatigue, or lassitude. HEENT: No headaches, difficulty swallowing, tooth/dental problems, or sore throat. No sneezing, itching, ear ache, nasal congestion, or post nasal drip CV:  No chest pain, orthopnea, PND, swelling in lower extremities, anasarca, dizziness, palpitations, syncope Resp: +minimal shortness of breath with exertion. No excess mucus or change in color of mucus. No productive or non-productive. No hemoptysis. No wheezing.  No chest wall deformity GI:  No heartburn, indigestion Skin: No rash, lesions, ulcerations MSK:  No joint pain or swelling.   Neuro: No dizziness or lightheadedness.  Psych: No depression or anxiety. Mood stable.     Physical Exam:  BP 124/74 (BP Location: Left Arm, Cuff Size: Normal)   Pulse 80   Temp 97.8 F (36.6 C) (Temporal)   Ht 5\' 3"  (1.6 m)   Wt 133 lb 6.4 oz (60.5 kg)   LMP 02/21/1966   SpO2 98%   BMI 23.63 kg/m   GEN: Pleasant, interactive, well-kempt; appears younger than stated age; in no acute distress. HEENT:  Normocephalic and atraumatic. PERRLA. Sclera white. Nasal turbinates pink, moist and patent bilaterally. No rhinorrhea present. Oropharynx pink and moist, without exudate or edema. No lesions, ulcerations, or postnasal drip.  NECK:  Supple w/ fair ROM. No JVD present. Normal carotid impulses w/o bruits. Thyroid symmetrical with no goiter or nodules palpated. No lymphadenopathy.   CV: RRR, no m/r/g, no peripheral edema. Pulses intact, +2 bilaterally. No cyanosis, pallor or clubbing. PULMONARY:   Unlabored, regular breathing. Clear bilaterally A&P w/o wheezes/rales/rhonchi. No accessory muscle use.  GI: BS present and normoactive. Soft, non-tender to palpation. No organomegaly or masses detected.  MSK: No erythema, warmth or tenderness. Cap refil <2 sec all extrem. No deformities or joint swelling noted.  Neuro: A/Ox3. No focal deficits noted.   Skin: Warm, no lesions or rashe Psych: Normal affect and behavior. Judgement and thought content appropriate.     Lab Results:  CBC    Component Value Date/Time   WBC 4.5 11/24/2021 0942   RBC 4.10 11/24/2021 0942   HGB 12.3 11/24/2021 0942   HGB 13.5 04/17/2013 1055   HCT 37.5 11/24/2021 0942   HCT 41.8 04/17/2013 1055   PLT 221.0 11/24/2021 0942   PLT 217 04/17/2013 1055   MCV 91.6 11/24/2021 0942   MCV 91 04/17/2013 1055   MCH 30.0 11/23/2019 1459   MCHC 32.8 11/24/2021 0942   RDW 13.4 11/24/2021 0942   RDW 13.7 04/17/2013 1055   LYMPHSABS 1.4 11/24/2021 0942   LYMPHSABS 1.4 04/17/2013 1055  MONOABS 0.4 11/24/2021 0942   MONOABS 0.5 04/17/2013 1055   EOSABS 0.0 11/24/2021 0942   EOSABS 0.0 04/17/2013 1055   BASOSABS 0.0 11/24/2021 0942   BASOSABS 0.0 04/17/2013 1055    BMET    Component Value Date/Time   NA 141 07/20/2022 0853   NA 144 04/17/2021 1117   NA 141 04/17/2013 1055   K 4.4 07/20/2022 0853   K 4.0 04/17/2013 1055   CL 103 07/20/2022 0853   CL 102 04/17/2013 1055   CO2 32 07/20/2022 0853   CO2 34 (H) 04/17/2013 1055   GLUCOSE 110 (H) 07/20/2022 0853   GLUCOSE 90 04/17/2013 1055   BUN 27 (H) 07/20/2022 0853   BUN 27 04/17/2021 1117   BUN 15 04/17/2013 1055   CREATININE 1.38 (H) 07/20/2022 0853   CREATININE 0.67 07/25/2014 1706   CALCIUM 9.7 07/20/2022 0853   CALCIUM 9.2 04/17/2013 1055   GFRNONAA 39 (L) 02/21/2020 1512   GFRNONAA 52 (L) 11/23/2019 1459   GFRNONAA >60 04/17/2013 1055   GFRAA 45 (L) 02/21/2020 1512   GFRAA >60 04/17/2013 1055    BNP    Component Value Date/Time   BNP 98.9  11/23/2019 1459     Imaging:  No results found.       Latest Ref Rng & Units 09/15/2021    3:53 PM  PFT Results  FVC-Pre L 1.43   FVC-Predicted Pre % 76   FVC-Post L 1.21   FVC-Predicted Post % 65   Pre FEV1/FVC % % 51   Post FEV1/FCV % % 55   FEV1-Pre L 0.72   FEV1-Predicted Pre % 53   FEV1-Post L 0.67   DLCO uncorrected ml/min/mmHg 3.74   TLC L 6.03   TLC % Predicted % 126   RV % Predicted % 188     No results found for: "NITRICOXIDE"      Assessment & Plan:   COPD with asthma COPD with asthma. Compensated on current regimen. No exacerbations. Encouraged to remain active. Action plan in place. Refills sent for Breo and albuterol.  Patient Instructions  Continue Albuterol inhaler 2 puffs every 6 hours as needed for shortness of breath or wheezing. Notify if symptoms persist despite rescue inhaler/neb use.  Continue flonase nasal spray 2 sprays each nostril daily Continue Breo 1 puff daily. Brush tongue and rinse mouth afterwards  Follow up in 6 months with Dr. Jayme Cloud or Katie Avanna Sowder,NP. If symptoms worsen, please contact office for sooner follow up or seek emergency care.    I spent 25 minutes of dedicated to the care of this patient on the date of this encounter to include pre-visit review of records, face-to-face time with the patient discussing conditions above, post visit ordering of testing, clinical documentation with the electronic health record, making appropriate referrals as documented, and communicating necessary findings to members of the patients care team.  Noemi Chapel, NP 08/04/2022  Pt aware and understands NP's role.

## 2022-08-04 NOTE — Assessment & Plan Note (Signed)
COPD with asthma. Compensated on current regimen. No exacerbations. Encouraged to remain active. Action plan in place. Refills sent for Breo and albuterol.  Patient Instructions  Continue Albuterol inhaler 2 puffs every 6 hours as needed for shortness of breath or wheezing. Notify if symptoms persist despite rescue inhaler/neb use.  Continue flonase nasal spray 2 sprays each nostril daily Continue Breo 1 puff daily. Brush tongue and rinse mouth afterwards  Follow up in 6 months with Kathy Dominguez or Katie Pinky Ravan,NP. If symptoms worsen, please contact office for sooner follow up or seek emergency care.

## 2022-08-06 NOTE — Progress Notes (Signed)
Reviewed and agree with assessment/plan.   Coralyn Helling, MD [facility] 08/06/2022, 8:54 AM Pager:  [phone]

## 2022-08-09 NOTE — Progress Notes (Signed)
Agree with the details of the visit as noted by Katherine Cobb, NP. ° °C. Laura Gonzalez, MD °Advanced Bronchoscopy °[facility] ° °

## 2022-09-08 ENCOUNTER — Other Ambulatory Visit: Payer: Self-pay | Admitting: Cardiovascular Disease

## 2022-11-04 ENCOUNTER — Ambulatory Visit: Payer: Medicare HMO | Admitting: Internal Medicine

## 2022-11-04 ENCOUNTER — Encounter: Payer: Self-pay | Admitting: Internal Medicine

## 2022-11-04 VITALS — BP 120/72 | HR 92 | Temp 97.8°F | Resp 16 | Ht 62.0 in | Wt 128.0 lb

## 2022-11-04 DIAGNOSIS — I110 Hypertensive heart disease with heart failure: Secondary | ICD-10-CM | POA: Diagnosis not present

## 2022-11-04 DIAGNOSIS — E7800 Pure hypercholesterolemia, unspecified: Secondary | ICD-10-CM

## 2022-11-04 DIAGNOSIS — R739 Hyperglycemia, unspecified: Secondary | ICD-10-CM | POA: Diagnosis not present

## 2022-11-04 DIAGNOSIS — I4729 Other ventricular tachycardia: Secondary | ICD-10-CM

## 2022-11-04 DIAGNOSIS — I700 Atherosclerosis of aorta: Secondary | ICD-10-CM

## 2022-11-04 DIAGNOSIS — I428 Other cardiomyopathies: Secondary | ICD-10-CM

## 2022-11-04 DIAGNOSIS — Z0000 Encounter for general adult medical examination without abnormal findings: Secondary | ICD-10-CM

## 2022-11-04 DIAGNOSIS — R32 Unspecified urinary incontinence: Secondary | ICD-10-CM

## 2022-11-04 DIAGNOSIS — J4489 Other specified chronic obstructive pulmonary disease: Secondary | ICD-10-CM

## 2022-11-04 DIAGNOSIS — Z85038 Personal history of other malignant neoplasm of large intestine: Secondary | ICD-10-CM

## 2022-11-04 DIAGNOSIS — I5022 Chronic systolic (congestive) heart failure: Secondary | ICD-10-CM

## 2022-11-04 DIAGNOSIS — I5042 Chronic combined systolic (congestive) and diastolic (congestive) heart failure: Secondary | ICD-10-CM

## 2022-11-04 DIAGNOSIS — Z1231 Encounter for screening mammogram for malignant neoplasm of breast: Secondary | ICD-10-CM

## 2022-11-04 DIAGNOSIS — N1832 Chronic kidney disease, stage 3b: Secondary | ICD-10-CM

## 2022-11-04 DIAGNOSIS — I10 Essential (primary) hypertension: Secondary | ICD-10-CM

## 2022-11-04 LAB — BASIC METABOLIC PANEL
BUN: 26 mg/dL — ABNORMAL HIGH (ref 6–23)
CO2: 34 meq/L — ABNORMAL HIGH (ref 19–32)
Calcium: 9.8 mg/dL (ref 8.4–10.5)
Chloride: 99 meq/L (ref 96–112)
Creatinine, Ser: 1.39 mg/dL — ABNORMAL HIGH (ref 0.40–1.20)
GFR: 32.91 mL/min — ABNORMAL LOW (ref >60.00)
Glucose, Bld: 100 mg/dL — ABNORMAL HIGH (ref 70–99)
Potassium: 4.7 meq/L (ref 3.5–5.1)
Sodium: 140 meq/L (ref 135–145)

## 2022-11-04 LAB — HEPATIC FUNCTION PANEL
ALT: 11 U/L (ref 0–35)
AST: 17 U/L (ref 0–37)
Albumin: 4.5 g/dL (ref 3.5–5.2)
Alkaline Phosphatase: 70 U/L (ref 39–117)
Bilirubin, Direct: 0.1 mg/dL (ref 0.0–0.3)
Total Bilirubin: 0.5 mg/dL (ref 0.2–1.2)
Total Protein: 7.1 g/dL (ref 6.0–8.3)

## 2022-11-04 LAB — CBC WITH DIFFERENTIAL/PLATELET
Basophils Absolute: 0 10*3/uL (ref 0.0–0.1)
Basophils Relative: 0.6 % (ref 0.0–3.0)
Eosinophils Absolute: 0 10*3/uL (ref 0.0–0.7)
Eosinophils Relative: 0.1 % (ref 0.0–5.0)
HCT: 40.9 % (ref 36.0–46.0)
Hemoglobin: 13.1 g/dL (ref 12.0–15.0)
Lymphocytes Relative: 29.1 % (ref 12.0–46.0)
Lymphs Abs: 1.1 10*3/uL (ref 0.7–4.0)
MCHC: 31.9 g/dL (ref 30.0–36.0)
MCV: 92.8 fL (ref 78.0–100.0)
Monocytes Absolute: 0.4 10*3/uL (ref 0.1–1.0)
Monocytes Relative: 10.4 % (ref 3.0–12.0)
Neutro Abs: 2.3 10*3/uL (ref 1.4–7.7)
Neutrophils Relative %: 59.8 % (ref 43.0–77.0)
Platelets: 247 10*3/uL (ref 150.0–400.0)
RBC: 4.41 Mil/uL (ref 3.87–5.11)
RDW: 14.5 % (ref 11.5–15.5)
WBC: 3.8 10*3/uL — ABNORMAL LOW (ref 4.0–10.5)

## 2022-11-04 LAB — LIPID PANEL
Cholesterol: 227 mg/dL — ABNORMAL HIGH (ref 0–200)
HDL: 89.9 mg/dL (ref >39.00)
LDL Cholesterol: 125 mg/dL — ABNORMAL HIGH (ref 0–99)
NonHDL: 137.05
Total CHOL/HDL Ratio: 3
Triglycerides: 61 mg/dL (ref 0.0–149.0)
VLDL: 12.2 mg/dL (ref 0.0–40.0)

## 2022-11-04 LAB — HEMOGLOBIN A1C: Hgb A1c MFr Bld: 6.1 % (ref 4.6–6.5)

## 2022-11-04 MED ORDER — EZETIMIBE 10 MG PO TABS: 10.0000 mg | ORAL_TABLET | Freq: Every day | ORAL | 1 refills | Status: DC

## 2022-11-04 NOTE — Assessment & Plan Note (Signed)
Breathing stable.  Follow. Continue Breo and albuterol prn.

## 2022-11-04 NOTE — Assessment & Plan Note (Signed)
Physical today 11/04/22.  S/p hysterectomy.  Mammogram 12/02/21 - Birads I.  Colonoscopy 2014.  Saw oncology - PET - ok.

## 2022-11-04 NOTE — Assessment & Plan Note (Signed)
Continue aldactone, entresto, metoprolol and lasix.  Follow pressures. Follow metabolic panel.  

## 2022-11-04 NOTE — Assessment & Plan Note (Signed)
Was on crestor. Intolerance.  Off now.  Now taking zetia. Follow lipid panel.

## 2022-11-04 NOTE — Assessment & Plan Note (Signed)
Taking metoprolol, spironolactone, entrestro and imdur.  Has lasix. Weight has been stable.  Follow.

## 2022-11-04 NOTE — Assessment & Plan Note (Signed)
Recently evaluated by oncology for increase CEA.  PET ok.  Follow.

## 2022-11-04 NOTE — Assessment & Plan Note (Signed)
Avoid antiinflammatories.  Follow metabolic panel.  

## 2022-11-04 NOTE — Assessment & Plan Note (Signed)
Follow met b and a1c.  

## 2022-11-04 NOTE — Assessment & Plan Note (Signed)
Taking metoprolol, spironolactone, entrestro and imdur.  Has lasix.  Weight has been stable.  Follow. No evidence of volume overload on exam today.

## 2022-11-04 NOTE — Progress Notes (Signed)
Subjective:    [name], female    DOB: Oct 30, 1928, 89 y.o.   MRN: [id number]  Patient here for  Chief Complaint  Patient presents with   Annual Exam    HPI Here for her physical exam.  Had f/u with pulmonary 08/04/22. Breathing stable.  Cotinues Breo and albuterol prn. Saw Dr Sherron Monday - urge incontinence - has tried myrbetriq. Last seen - 05/17/22 - after trial of gemtesa. Per note, symptoms improved. She reports unsure if could tell a big difference, but she reports the medication was costly. Prefers not to continue.  Discussed PFT - referral to PT. Saw Dr Mariah Milling 04/20/22 - lasix qod and entrestro, imdur,aldactone and metoprolol.  Breathing stable.  No chest pain.  No abdominal pain.  Bowels moving.  Overall feels good. Weight at home 128-130lbs.  Discussed daily weights.    Past Medical History:  Diagnosis Date   Asthma    Chronic systolic CHF (congestive heart failure) (HCC)    a. 01/2015 Echo: EF 25-30%, sev diff HK, mild to mod MR, mildly dil LA, nl RV fxn, PASP 61 mmHg.   Colon cancer (HCC) 2005   Hypercholesterolemia    Hypertension    Hypertensive heart disease with CHF (congestive heart failure) (HCC)    NICM (nonischemic cardiomyopathy) (HCC)    a. 01/2015 Echo: EF 25-30% sev diff HK;  b. 01/2015 Lexi MV: EF 19%, small defect of mild severity in apex - likely breast attenuation, no ischemia.   NSVT (nonsustained ventricular tachycardia) (HCC)    Osteoporosis    Past Surgical History:  Procedure Laterality Date   ABDOMINAL HYSTERECTOMY     BREAST BIOPSY Left 4/15`   BENIGN BREAST EPITHELIUM WITH NODULAR FIBROSIS.   INGUINAL HERNIA REPAIR Left 05/04/2016   Procedure: HERNIA REPAIR INGUINAL ADULT;  Surgeon: Earline Mayotte, MD;  Location: [facility] ORS;  Service: General;  Laterality: Left;   THYROIDECTOMY, PARTIAL  1956   TUBAL LIGATION     Family History  Problem Relation Age of Onset   Stroke Mother    Colon cancer Father    Breast cancer Daughter  44   Breast cancer Cousin    Social History   Socioeconomic History   Marital status: Widowed    Spouse name: Not on file   Number of children: Not on file   Years of education: Not on file   Highest education level: Bachelor's degree (e.g., BA, AB, BS)  Occupational History   Not on file  Tobacco Use   Smoking status: Never   Smokeless tobacco: Never  Vaping Use   Vaping status: Never Used  Substance and Sexual Activity   Alcohol use: No    Alcohol/week: 0.0 standard drinks of alcohol   Drug use: No   Sexual activity: Not on file  Other Topics Concern   Not on file  Social History Narrative   Not on file   Social Determinants of Health   Financial Resource Strain: Low Risk  (06/27/2022)   Overall Financial Resource Strain (CARDIA)    Difficulty of Paying Living Expenses: Not very hard  Food Insecurity: No Food Insecurity (06/27/2022)   Hunger Vital Sign    Worried About Running Out of Food in the Last Year: Never true    Ran Out of Food in the Last Year: Never true  Transportation Needs: No Transportation Needs (06/27/2022)   PRAPARE - Administrator, Civil Service (Medical): No    Lack of Transportation (  Result Date: 12/02/2021 EXAM: DUAL X-RAY ABSORPTIOMETRY (DXA) FOR BONE MINERAL DENSITY IMPRESSION: Your patient Talisha Erby completed a BMD test on  12/02/2021 using the [employer] DXA System (software version: 14.10) manufactured by [employer]. The following summarizes the results of our evaluation. Technologist: SCE PATIENT BIOGRAPHICAL: Name: Eternity, Dexter Patient ID: [id number] Birth Date: 1928-04-12 Height: 62.0 in. Gender: Female Exam Date: 12/02/2021 Weight: 139.3 lbs. Indications: Advanced Age, Asthma, Height Loss, Hysterectomy, Osteoarthritis, Postmenopausal, Vitamin D Deficiency, History of Fracture (Adult) Fractures: Foot Right Treatments: Vitamin D DENSITOMETRY RESULTS: Site      Region      Measured Date Measured Age WHO Classification Young Adult T-score BMD         %Change vs. Previous Significant Change (*) AP Spine L1-L2 12/02/2021 91.7 Osteopenia -1.1 1.040 g/cm2 - - DualFemur Total Right 12/02/2021 91.7 Osteopenia -1.5 0.824 g/cm2 - - ASSESSMENT: The BMD measured at Femur Total Right is 0.824 g/cm2 with a T-score of -1.5. This patient is considered osteopenic according to World Health Organization [facility]) criteria. L3 and L4 was excluded Dominguez to degenerative changes. Patient is not a candidate for FRAX Dominguez to age. The scan quality is good. World Science writer [facility]) criteria for post-menopausal, Caucasian Women: Normal:                   T-score at or above -1 SD Osteopenia/low bone mass: T-score between -1 and -2.5 SD Osteoporosis:             T-score at or below -2.5 SD RECOMMENDATIONS: 1. All patients should optimize calcium and vitamin D intake. 2. Consider FDA-approved medical therapies in postmenopausal women and men aged 75 years and older, based on the following: a. A hip or vertebral(clinical or morphometric) fracture b. T-score < -2.5 at the femoral neck or spine after appropriate evaluation to exclude secondary causes c. Low bone mass (T-score between -1.0 and -2.5 at the femoral neck or spine) and a 10-year probability of a hip fracture > 3% or a 10-year probability of a major osteoporosis-related fracture > 20%  based on the [place]-adapted WHO algorithm 3. Clinician judgment and/or patient preferences may indicate treatment for people with 10-year fracture probabilities above or below these levels FOLLOW-UP: People with diagnosed cases of osteoporosis or at high risk for fracture should have regular bone mineral density tests. For patients eligible for Medicare, routine testing is allowed once every 2 years. The testing frequency can be increased to one year for patients who have rapidly progressing disease, those who are receiving or discontinuing medical therapy to restore bone mass, or have additional risk factors. I have reviewed this report, and agree with the above findings. [facility] Electronically Signed   By: Frederico Hamman M.D.   On: 12/02/2021 12:17       Assessment & Plan:  Routine general medical examination at a health care facility  Visit for screening mammogram -     3D Screening Mammogram, Left and Right; Future  Hypercholesterolemia Assessment & Plan: Was on crestor. Intolerance.  Off now.  Now taking zetia. Follow lipid panel.   Orders: -     Lipid panel -     Hepatic function panel -     Basic metabolic panel -     CBC with Differential/Platelet  Hyperglycemia Assessment & Plan: Follow met b and a1c.   Orders: -     Hemoglobin A1c  Health care maintenance Assessment & Plan: Physical today 11/04/22.  Result Date: 12/02/2021 EXAM: DUAL X-RAY ABSORPTIOMETRY (DXA) FOR BONE MINERAL DENSITY IMPRESSION: Your patient Talisha Erby completed a BMD test on  12/02/2021 using the [employer] DXA System (software version: 14.10) manufactured by [employer]. The following summarizes the results of our evaluation. Technologist: SCE PATIENT BIOGRAPHICAL: Name: Eternity, Dexter Patient ID: [id number] Birth Date: 1928-04-12 Height: 62.0 in. Gender: Female Exam Date: 12/02/2021 Weight: 139.3 lbs. Indications: Advanced Age, Asthma, Height Loss, Hysterectomy, Osteoarthritis, Postmenopausal, Vitamin D Deficiency, History of Fracture (Adult) Fractures: Foot Right Treatments: Vitamin D DENSITOMETRY RESULTS: Site      Region      Measured Date Measured Age WHO Classification Young Adult T-score BMD         %Change vs. Previous Significant Change (*) AP Spine L1-L2 12/02/2021 91.7 Osteopenia -1.1 1.040 g/cm2 - - DualFemur Total Right 12/02/2021 91.7 Osteopenia -1.5 0.824 g/cm2 - - ASSESSMENT: The BMD measured at Femur Total Right is 0.824 g/cm2 with a T-score of -1.5. This patient is considered osteopenic according to World Health Organization [facility]) criteria. L3 and L4 was excluded Dominguez to degenerative changes. Patient is not a candidate for FRAX Dominguez to age. The scan quality is good. World Science writer [facility]) criteria for post-menopausal, Caucasian Women: Normal:                   T-score at or above -1 SD Osteopenia/low bone mass: T-score between -1 and -2.5 SD Osteoporosis:             T-score at or below -2.5 SD RECOMMENDATIONS: 1. All patients should optimize calcium and vitamin D intake. 2. Consider FDA-approved medical therapies in postmenopausal women and men aged 75 years and older, based on the following: a. A hip or vertebral(clinical or morphometric) fracture b. T-score < -2.5 at the femoral neck or spine after appropriate evaluation to exclude secondary causes c. Low bone mass (T-score between -1.0 and -2.5 at the femoral neck or spine) and a 10-year probability of a hip fracture > 3% or a 10-year probability of a major osteoporosis-related fracture > 20%  based on the [place]-adapted WHO algorithm 3. Clinician judgment and/or patient preferences may indicate treatment for people with 10-year fracture probabilities above or below these levels FOLLOW-UP: People with diagnosed cases of osteoporosis or at high risk for fracture should have regular bone mineral density tests. For patients eligible for Medicare, routine testing is allowed once every 2 years. The testing frequency can be increased to one year for patients who have rapidly progressing disease, those who are receiving or discontinuing medical therapy to restore bone mass, or have additional risk factors. I have reviewed this report, and agree with the above findings. [facility] Electronically Signed   By: Frederico Hamman M.D.   On: 12/02/2021 12:17       Assessment & Plan:  Routine general medical examination at a health care facility  Visit for screening mammogram -     3D Screening Mammogram, Left and Right; Future  Hypercholesterolemia Assessment & Plan: Was on crestor. Intolerance.  Off now.  Now taking zetia. Follow lipid panel.   Orders: -     Lipid panel -     Hepatic function panel -     Basic metabolic panel -     CBC with Differential/Platelet  Hyperglycemia Assessment & Plan: Follow met b and a1c.   Orders: -     Hemoglobin A1c  Health care maintenance Assessment & Plan: Physical today 11/04/22.  Subjective:    [name], female    DOB: Oct 30, 1928, 89 y.o.   MRN: [id number]  Patient here for  Chief Complaint  Patient presents with   Annual Exam    HPI Here for her physical exam.  Had f/u with pulmonary 08/04/22. Breathing stable.  Cotinues Breo and albuterol prn. Saw Dr Sherron Monday - urge incontinence - has tried myrbetriq. Last seen - 05/17/22 - after trial of gemtesa. Per note, symptoms improved. She reports unsure if could tell a big difference, but she reports the medication was costly. Prefers not to continue.  Discussed PFT - referral to PT. Saw Dr Mariah Milling 04/20/22 - lasix qod and entrestro, imdur,aldactone and metoprolol.  Breathing stable.  No chest pain.  No abdominal pain.  Bowels moving.  Overall feels good. Weight at home 128-130lbs.  Discussed daily weights.    Past Medical History:  Diagnosis Date   Asthma    Chronic systolic CHF (congestive heart failure) (HCC)    a. 01/2015 Echo: EF 25-30%, sev diff HK, mild to mod MR, mildly dil LA, nl RV fxn, PASP 61 mmHg.   Colon cancer (HCC) 2005   Hypercholesterolemia    Hypertension    Hypertensive heart disease with CHF (congestive heart failure) (HCC)    NICM (nonischemic cardiomyopathy) (HCC)    a. 01/2015 Echo: EF 25-30% sev diff HK;  b. 01/2015 Lexi MV: EF 19%, small defect of mild severity in apex - likely breast attenuation, no ischemia.   NSVT (nonsustained ventricular tachycardia) (HCC)    Osteoporosis    Past Surgical History:  Procedure Laterality Date   ABDOMINAL HYSTERECTOMY     BREAST BIOPSY Left 4/15`   BENIGN BREAST EPITHELIUM WITH NODULAR FIBROSIS.   INGUINAL HERNIA REPAIR Left 05/04/2016   Procedure: HERNIA REPAIR INGUINAL ADULT;  Surgeon: Earline Mayotte, MD;  Location: [facility] ORS;  Service: General;  Laterality: Left;   THYROIDECTOMY, PARTIAL  1956   TUBAL LIGATION     Family History  Problem Relation Age of Onset   Stroke Mother    Colon cancer Father    Breast cancer Daughter  44   Breast cancer Cousin    Social History   Socioeconomic History   Marital status: Widowed    Spouse name: Not on file   Number of children: Not on file   Years of education: Not on file   Highest education level: Bachelor's degree (e.g., BA, AB, BS)  Occupational History   Not on file  Tobacco Use   Smoking status: Never   Smokeless tobacco: Never  Vaping Use   Vaping status: Never Used  Substance and Sexual Activity   Alcohol use: No    Alcohol/week: 0.0 standard drinks of alcohol   Drug use: No   Sexual activity: Not on file  Other Topics Concern   Not on file  Social History Narrative   Not on file   Social Determinants of Health   Financial Resource Strain: Low Risk  (06/27/2022)   Overall Financial Resource Strain (CARDIA)    Difficulty of Paying Living Expenses: Not very hard  Food Insecurity: No Food Insecurity (06/27/2022)   Hunger Vital Sign    Worried About Running Out of Food in the Last Year: Never true    Ran Out of Food in the Last Year: Never true  Transportation Needs: No Transportation Needs (06/27/2022)   PRAPARE - Administrator, Civil Service (Medical): No    Lack of Transportation (  Subjective:    [name], female    DOB: Oct 30, 1928, 89 y.o.   MRN: [id number]  Patient here for  Chief Complaint  Patient presents with   Annual Exam    HPI Here for her physical exam.  Had f/u with pulmonary 08/04/22. Breathing stable.  Cotinues Breo and albuterol prn. Saw Dr Sherron Monday - urge incontinence - has tried myrbetriq. Last seen - 05/17/22 - after trial of gemtesa. Per note, symptoms improved. She reports unsure if could tell a big difference, but she reports the medication was costly. Prefers not to continue.  Discussed PFT - referral to PT. Saw Dr Mariah Milling 04/20/22 - lasix qod and entrestro, imdur,aldactone and metoprolol.  Breathing stable.  No chest pain.  No abdominal pain.  Bowels moving.  Overall feels good. Weight at home 128-130lbs.  Discussed daily weights.    Past Medical History:  Diagnosis Date   Asthma    Chronic systolic CHF (congestive heart failure) (HCC)    a. 01/2015 Echo: EF 25-30%, sev diff HK, mild to mod MR, mildly dil LA, nl RV fxn, PASP 61 mmHg.   Colon cancer (HCC) 2005   Hypercholesterolemia    Hypertension    Hypertensive heart disease with CHF (congestive heart failure) (HCC)    NICM (nonischemic cardiomyopathy) (HCC)    a. 01/2015 Echo: EF 25-30% sev diff HK;  b. 01/2015 Lexi MV: EF 19%, small defect of mild severity in apex - likely breast attenuation, no ischemia.   NSVT (nonsustained ventricular tachycardia) (HCC)    Osteoporosis    Past Surgical History:  Procedure Laterality Date   ABDOMINAL HYSTERECTOMY     BREAST BIOPSY Left 4/15`   BENIGN BREAST EPITHELIUM WITH NODULAR FIBROSIS.   INGUINAL HERNIA REPAIR Left 05/04/2016   Procedure: HERNIA REPAIR INGUINAL ADULT;  Surgeon: Earline Mayotte, MD;  Location: [facility] ORS;  Service: General;  Laterality: Left;   THYROIDECTOMY, PARTIAL  1956   TUBAL LIGATION     Family History  Problem Relation Age of Onset   Stroke Mother    Colon cancer Father    Breast cancer Daughter  44   Breast cancer Cousin    Social History   Socioeconomic History   Marital status: Widowed    Spouse name: Not on file   Number of children: Not on file   Years of education: Not on file   Highest education level: Bachelor's degree (e.g., BA, AB, BS)  Occupational History   Not on file  Tobacco Use   Smoking status: Never   Smokeless tobacco: Never  Vaping Use   Vaping status: Never Used  Substance and Sexual Activity   Alcohol use: No    Alcohol/week: 0.0 standard drinks of alcohol   Drug use: No   Sexual activity: Not on file  Other Topics Concern   Not on file  Social History Narrative   Not on file   Social Determinants of Health   Financial Resource Strain: Low Risk  (06/27/2022)   Overall Financial Resource Strain (CARDIA)    Difficulty of Paying Living Expenses: Not very hard  Food Insecurity: No Food Insecurity (06/27/2022)   Hunger Vital Sign    Worried About Running Out of Food in the Last Year: Never true    Ran Out of Food in the Last Year: Never true  Transportation Needs: No Transportation Needs (06/27/2022)   PRAPARE - Administrator, Civil Service (Medical): No    Lack of Transportation (

## 2022-11-04 NOTE — Assessment & Plan Note (Signed)
Documented per cardiology. On metoprolol. Stable.  

## 2022-11-04 NOTE — Assessment & Plan Note (Signed)
Saw Dr Sherron Monday - urge incontinence - has tried myrbetriq. Last seen - 05/17/22 - after trial of gemtesa. Per note, symptoms improved. She reports unsure if could tell a big difference, but she reports the medication was costly. Prefers not to continue.  Discussed PFT - referral to PT.

## 2022-11-04 NOTE — Assessment & Plan Note (Signed)
No evidence of volume overload on exam.  Continue entrestro, aldactone and metoprolol.  Has lasix. Follow.

## 2022-11-04 NOTE — Assessment & Plan Note (Signed)
Off cholesterol medication.  Statin intolerance.  Continue zetia.

## 2022-12-01 ENCOUNTER — Telehealth: Payer: Self-pay | Admitting: Cardiovascular Disease

## 2022-12-01 ENCOUNTER — Ambulatory Visit: Payer: Medicare HMO | Admitting: *Deleted

## 2022-12-01 VITALS — BP 124/78 | HR 70 | Ht 62.0 in | Wt 130.2 lb

## 2022-12-01 DIAGNOSIS — Z0000 Encounter for general adult medical examination without abnormal findings: Secondary | ICD-10-CM | POA: Diagnosis not present

## 2022-12-01 NOTE — Telephone Encounter (Signed)
Patient dropped off patient assistance forms for Entresto. Placed form in Pam's box.

## 2022-12-01 NOTE — Progress Notes (Signed)
Subjective:   Kathy Dominguez is a 89 y.o. female who presents for Medicare Annual (Subsequent) preventive examination.  Visit Complete: In person   Cardiac Risk Factors include: advanced age (>35men, >84 women);dyslipidemia;hypertension     Objective:    Today's Vitals   12/01/22 0856  BP: 124/78  Pulse: 70  SpO2: 94%  Weight: 130 lb 4 oz (59.1 kg)  Height: 5\' 2"  (1.575 m)   Body mass index is 23.82 kg/m.     12/01/2022    9:16 AM 11/27/2021    9:18 AM 11/26/2020    9:35 AM 11/26/2019    9:23 AM 11/23/2019    2:36 PM 11/23/2019   12:11 PM 06/28/2019    4:58 PM  Advanced Directives  Does Patient Have a Medical Advance Directive? No No No No No No No  Would patient like information on creating a medical advance directive? Yes (MAU/Ambulatory/Procedural Areas - Information given) No - Patient declined No - Patient declined No - Patient declined   No - Patient declined    Current Medications (verified) Outpatient Encounter Medications as of 12/01/2022  Medication Sig   acetaminophen (TYLENOL) 325 MG tablet Take 650 mg by mouth every 6 (six) hours as needed.   albuterol (VENTOLIN HFA) 108 (90 Base) MCG/ACT inhaler Inhale 2 puffs into the lungs every 6 (six) hours as needed for wheezing or shortness of breath.   aspirin 81 MG EC tablet Take 81 mg by mouth daily. Swallow whole.   Cholecalciferol (VITAMIN D-3) 1000 UNITS CAPS Take 2,000 Units by mouth daily.   ezetimibe (ZETIA) 10 MG tablet Take 1 tablet (10 mg total) by mouth daily.   fluticasone (FLONASE) 50 MCG/ACT nasal spray Place 1 spray into both nostrils daily.   fluticasone furoate-vilanterol (BREO ELLIPTA) 100-25 MCG/ACT AEPB Inhale 1 puff into the lungs daily.   furosemide (LASIX) 40 MG tablet Take 1/2-1 tablet as directed per weight gain   isosorbide mononitrate (IMDUR) 30 MG 24 hr tablet Take 1 tablet (30 mg total) by mouth daily.   metoprolol succinate (TOPROL-XL) 25 MG 24 hr tablet Take 1 tablet (25 mg total)  by mouth daily.   sacubitril-valsartan (ENTRESTO) 97-103 MG Take 1 tablet by mouth 2 (two) times daily.   senna-docusate (SENOKOT-S) 8.6-50 MG tablet Take 2 tablets by mouth 2 (two) times daily as needed for mild constipation.   spironolactone (ALDACTONE) 25 MG tablet Take 1 tablet (25 mg total) by mouth daily.   No facility-administered encounter medications on file as of 12/01/2022.    Allergies (verified) Patient has no known allergies.   History: Past Medical History:  Diagnosis Date   Asthma    Chronic systolic CHF (congestive heart failure) (HCC)    a. 01/2015 Echo: EF 25-30%, sev diff HK, mild to mod MR, mildly dil LA, nl RV fxn, PASP 61 mmHg.   Colon cancer (HCC) 2005   Hypercholesterolemia    Hypertension    Hypertensive heart disease with CHF (congestive heart failure) (HCC)    NICM (nonischemic cardiomyopathy) (HCC)    a. 01/2015 Echo: EF 25-30% sev diff HK;  b. 01/2015 Lexi MV: EF 19%, small defect of mild severity in apex - likely breast attenuation, no ischemia.   NSVT (nonsustained ventricular tachycardia) (HCC)    Osteoporosis    Past Surgical History:  Procedure Laterality Date   ABDOMINAL HYSTERECTOMY     BREAST BIOPSY Left 4/15`   BENIGN BREAST EPITHELIUM WITH NODULAR FIBROSIS.   INGUINAL HERNIA REPAIR Left 05/04/2016  Procedure: HERNIA REPAIR INGUINAL ADULT;  Surgeon: Earline Mayotte, MD;  Location: [facility] ORS;  Service: General;  Laterality: Left;   THYROIDECTOMY, PARTIAL  1956   TUBAL LIGATION     Family History  Problem Relation Age of Onset   Stroke Mother    Colon cancer Father    Breast cancer Daughter 27   Breast cancer Cousin    Social History   Socioeconomic History   Marital status: Widowed    Spouse name: Not on file   Number of children: Not on file   Years of education: Not on file   Highest education level: Bachelor's degree (e.g., BA, AB, BS)  Occupational History   Not on file  Tobacco Use   Smoking status: Never   Smokeless  tobacco: Never  Vaping Use   Vaping status: Never Used  Substance and Sexual Activity   Alcohol use: No    Alcohol/week: 0.0 standard drinks of alcohol   Drug use: No   Sexual activity: Not on file  Other Topics Concern   Not on file  Social History Narrative   Lives a lone   Social Determinants of Health   Financial Resource Strain: Low Risk  (12/01/2022)   Overall Financial Resource Strain (CARDIA)    Difficulty of Paying Living Expenses: Not hard at all  Food Insecurity: No Food Insecurity (12/01/2022)   Hunger Vital Sign    Worried About Running Out of Food in the Last Year: Never true    Ran Out of Food in the Last Year: Never true  Transportation Needs: No Transportation Needs (12/01/2022)   PRAPARE - Administrator, Civil Service (Medical): No    Lack of Transportation (Non-Medical): No  Physical Activity: Inactive (12/01/2022)   Exercise Vital Sign    Days of Exercise per Week: 0 days    Minutes of Exercise per Session: 0 min  Stress: No Stress Concern Present (12/01/2022)   [employer] of Occupational Health - Occupational Stress Questionnaire    Feeling of Stress : Not at all  Social Connections: Moderately Integrated (12/01/2022)   Social Connection and Isolation Panel [NHANES]    Frequency of Communication with Friends and Family: More than three times a week    Frequency of Social Gatherings with Friends and Family: More than three times a week    Attends Religious Services: More than 4 times per year    Active Member of [employer] or Organizations: Yes    Attends Banker Meetings: More than 4 times per year    Marital Status: Widowed    Tobacco Counseling Counseling given: Not Answered   Clinical Intake:  Pre-visit preparation completed: Yes  Pain : No/denies pain     BMI - recorded: 23.82 Nutritional Status: BMI of 19-24  Normal Nutritional Risks: None Diabetes: No  How often do you need to have someone help you when you  read instructions, pamphlets, or other written materials from your doctor or pharmacy?: 1 - Never  Interpreter Needed?: No  Information entered by :: R. Kamia Insalaco LPN   Activities of Daily Living    12/01/2022    9:00 AM  In your present state of health, do you have any difficulty performing the following activities:  Hearing? 1  Comment wears aids  Vision? 0  Comment glasses  Difficulty concentrating or making decisions? 0  Walking or climbing stairs? 1  Comment climbing stairs  Dressing or bathing? 0  Doing errands, shopping? 0  Preparing  Food and eating ? N  Using the Toilet? N  In the past six months, have you accidently leaked urine? Y  Comment occassionally, wears pads  Do you have problems with loss of bowel control? N  Managing your Medications? N  Managing your Finances? N  Housekeeping or managing your Housekeeping? N    Patient Care Team: Dale [place], MD as PCP - General (Internal Medicine) Antonieta Iba, MD as PCP - Cardiology (Cardiology) Byrnett, Merrily Pew, MD (General Surgery) Dale [place], MD (Internal Medicine) Delma Freeze, FNP as Nurse Practitioner (Family Medicine) Mertie Moores, MD as Referring Physician (Pulmonary Disease) Antonieta Iba, MD as Consulting Physician (Cardiology)  Indicate any recent Medical Services you may have received from other than [facility] providers in the past year (date may be approximate).     Assessment:   This is a routine wellness examination for [facility].  Hearing/Vision screen Hearing Screening - Comments:: Wears aids Vision Screening - Comments:: glasses   Goals Addressed             This Visit's Progress    Patient Stated       Continue to get out and go and continue to stay active       Depression Screen    12/01/2022    9:07 AM 02/24/2022   10:46 AM 11/27/2021    9:03 AM 11/24/2021    9:01 AM 06/24/2021   11:31 AM 04/20/2021    9:05 AM 11/26/2020    9:13 AM  PHQ 2/9 Scores  PHQ - 2  Score 0 0 0 0 0 0 0  PHQ- 9 Score 1        Exception Documentation Patient refusal          Fall Risk    12/01/2022    9:03 AM 02/24/2022   10:46 AM 11/27/2021    9:14 AM 11/26/2021   12:04 PM 11/24/2021    9:00 AM  Fall Risk   Falls in the past year? 0 0 1 1 1   Number falls in past yr: 0 0  0 0  Injury with Fall? 0 0  1 1  Risk for fall due to : No Fall Risks No Fall Risks   History of fall(s);Impaired mobility  Follow up Falls prevention discussed;Falls evaluation completed Falls evaluation completed Falls evaluation completed;Falls prevention discussed  Falls evaluation completed    MEDICARE RISK AT HOME: Medicare Risk at Home Any stairs in or around the home?: No If so, are there any without handrails?: No Home free of loose throw rugs in walkways, pet beds, electrical cords, etc?: Yes Adequate lighting in your home to reduce risk of falls?: Yes Life alert?: No Use of a cane, walker or w/c?: No Grab bars in the bathroom?: No Shower chair or bench in shower?: No Elevated toilet seat or a handicapped toilet?: No  TIMED UP AND GO:  Was the test performed?  Yes  Length of time to ambulate 10 feet: 12 sec Gait slow and steady without use of assistive device    Cognitive Function:        12/01/2022    9:18 AM 11/27/2021    9:23 AM 11/26/2020    9:31 AM 11/26/2019    9:29 AM 11/23/2018    9:23 AM  6CIT Screen  What Year? 0 points 0 points 0 points 0 points 0 points  What month? 0 points 0 points 0 points 0 points 0 points  What time? 0 points  0 points 0 points    Count back from 20 0 points 0 points 0 points 0 points 0 points  Months in reverse 0 points 0 points 0 points 0 points 0 points  Repeat phrase 0 points 0 points 0 points  4 points  Total Score 0 points 0 points 0 points      Immunizations Immunization History  Administered Date(s) Administered   Fluad Quad(high Dose 65+) 12/06/2018   Fluad Trivalent(High Dose 65+) 10/06/2022   Influenza, High Dose Seasonal  PF 12/04/2015, 11/23/2016, 11/21/2017, 10/21/2020   Influenza,inj,Quad PF,6+ Mos 02/20/2015   Influenza-Unspecified 10/20/2021   PFIZER(Purple Top)SARS-COV-2 Vaccination 01/31/2019, 02/21/2019, 10/21/2019, 11/04/2020   Pneumococcal Conjugate-13 12/19/2017   Pneumococcal-Unspecified 01/26/2003   Rsv, Bivalent, Protein Subunit Rsvpref,pf Verdis Frederickson) 11/09/2021   Zoster Recombinant(Shingrix) 03/10/2018, 06/23/2018    TDAP status: Due, Education has been provided regarding the importance of this vaccine. Advised may receive this vaccine at local pharmacy or Health Dept. Aware to provide a copy of the vaccination record if obtained from local pharmacy or Health Dept. Verbalized acceptance and understanding.  Flu Vaccine status: Up to date  Pneumococcal vaccine status: Declined,  Education has been provided regarding the importance of this vaccine but patient still declined. Advised may receive this vaccine at local pharmacy or Health Dept. Aware to provide a copy of the vaccination record if obtained from local pharmacy or Health Dept. Verbalized acceptance and understanding.   Covid-19 vaccine status: Completed vaccines  Qualifies for Shingles Vaccine? Yes   Zostavax completed No   Shingrix Completed?: Yes  Screening Tests Health Maintenance  Topic Date Due   DTaP/Tdap/Td (1 - Tdap) Never done   COVID-19 Vaccine (5 - 2023-24 season) 09/26/2022   Medicare Annual Wellness (AWV)  11/28/2022   Pneumonia Vaccine 69+ Years old (2 of 2 - PPSV23 or PCV20) 02/25/2023 (Originally 12/20/2018)   INFLUENZA VACCINE  Completed   DEXA SCAN  Completed   Zoster Vaccines- Shingrix  Completed   HPV VACCINES  Aged Out    Health Maintenance  Health Maintenance Due  Topic Date Due   DTaP/Tdap/Td (1 - Tdap) Never done   COVID-19 Vaccine (5 - 2023-24 season) 09/26/2022   Medicare Annual Wellness (AWV)  11/28/2022    Colorectal cancer screening: No longer required.   Mammogram status: Completed  11/2021. Repeat every year Appointment scheduled  Bone Density status: Completed 11/2021. Results reflect: Bone density results: OSTEOPENIA. Repeat every 2 years.  Lung Cancer Screening: (Low Dose CT Chest recommended if Age 19-80 years, 20 pack-year currently smoking OR have quit w/in 15years.) does not qualify.     Additional Screening:  Hepatitis C Screening: does not qualify; Completed NA age  Vision Screening: Recommended annual ophthalmology exams for early detection of glaucoma and other disorders of the eye. Is the patient up to date with their annual eye exam?  No  Who is the provider or what is the name of the office in which the patient attends annual eye exams? [facility]  Patient will call and schedule an appointment If pt is not established with a provider, would they like to be referred to a provider to establish care? No .   Dental Screening: Recommended annual dental exams for proper oral hygiene    Community Resource Referral / Chronic Care Management: CRR required this visit?  No   CCM required this visit?  No     Plan:     I have personally reviewed and noted the following in the patient's  chart:   Medical and social history Use of alcohol, tobacco or illicit drugs  Current medications and supplements including opioid prescriptions. Patient is not currently taking opioid prescriptions. Functional ability and status Nutritional status Physical activity Advanced directives List of other physicians Hospitalizations, surgeries, and ER visits in previous 12 months Vitals Screenings to include cognitive, depression, and falls Referrals and appointments  In addition, I have reviewed and discussed with patient certain preventive protocols, quality metrics, and best practice recommendations. A written personalized care plan for preventive services as well as general preventive health recommendations were provided to patient.     Sydell Axon,  LPN   16/01/958   After Visit Summary: (In Person-Printed) AVS printed and given to the patient  Nurse Notes: None

## 2022-12-01 NOTE — Patient Instructions (Signed)
Kathy Dominguez , Thank you for taking time to come for your Medicare Wellness Visit. I appreciate your ongoing commitment to your health goals. Please review the following plan we discussed and let me know if I can assist you in the future.   Referrals/Orders/Follow-Ups/Clinician Recommendations: Remember to get your Tetanus vaccine  This is a list of the screening recommended for you and due dates:  Health Maintenance  Topic Date Due   DTaP/Tdap/Td vaccine (1 - Tdap) Never done   Pneumonia Vaccine (2 of 2 - PPSV23 or PCV20) 02/25/2023*   Mammogram  12/03/2022   COVID-19 Vaccine (6 - 2023-24 season) 12/30/2022   Medicare Annual Wellness Visit  12/01/2023   Flu Shot  Completed   DEXA scan (bone density measurement)  Completed   Zoster (Shingles) Vaccine  Completed   HPV Vaccine  Aged Out  *Topic was postponed. The date shown is not the original due date.    Advanced directives: (Provided) Advance directive discussed with you today. I have provided a copy for you to complete at home and have notarized. Once this is complete, please bring a copy in to our office so we can scan it into your chart.   Next Medicare Annual Wellness Visit scheduled for next year: Yes 12/06/23 @ 8:55

## 2022-12-01 NOTE — Telephone Encounter (Signed)
Application received and pending review.    Placed in pending patient assistance applications

## 2022-12-03 NOTE — Telephone Encounter (Signed)
Application completed and faxed to company 

## 2022-12-08 ENCOUNTER — Ambulatory Visit
Admission: RE | Admit: 2022-12-08 | Discharge: 2022-12-08 | Disposition: A | Discharge status: Home / Self Care | Payer: Medicare HMO | Source: Ambulatory Visit | Attending: Internal Medicine | Admitting: Internal Medicine

## 2022-12-08 DIAGNOSIS — Z1231 Encounter for screening mammogram for malignant neoplasm of breast: Secondary | ICD-10-CM | POA: Diagnosis present

## 2022-12-20 ENCOUNTER — Other Ambulatory Visit: Payer: Self-pay | Admitting: Cardiovascular Disease

## 2023-01-31 ENCOUNTER — Other Ambulatory Visit: Payer: Self-pay | Admitting: Cardiovascular Disease

## 2023-02-28 ENCOUNTER — Telehealth: Payer: Self-pay | Admitting: Internal Medicine

## 2023-02-28 DIAGNOSIS — E7800 Pure hypercholesterolemia, unspecified: Secondary | ICD-10-CM

## 2023-02-28 DIAGNOSIS — R739 Hyperglycemia, unspecified: Secondary | ICD-10-CM

## 2023-02-28 NOTE — Telephone Encounter (Signed)
 Patient need lab orders.

## 2023-03-02 ENCOUNTER — Encounter: Payer: Self-pay | Admitting: Internal Medicine

## 2023-03-02 NOTE — Telephone Encounter (Signed)
 Labs ordered.

## 2023-03-02 NOTE — Addendum Note (Signed)
 Addended by: Victorino Grates D on: 03/02/2023 07:26 AM   Modules accepted: Orders

## 2023-03-07 ENCOUNTER — Other Ambulatory Visit (INDEPENDENT_AMBULATORY_CARE_PROVIDER_SITE_OTHER): Payer: Medicare HMO

## 2023-03-07 DIAGNOSIS — R739 Hyperglycemia, unspecified: Secondary | ICD-10-CM

## 2023-03-07 DIAGNOSIS — E7800 Pure hypercholesterolemia, unspecified: Secondary | ICD-10-CM

## 2023-03-07 LAB — BASIC METABOLIC PANEL
BUN: 22 mg/dL (ref 6–23)
CO2: 32 meq/L (ref 19–32)
Calcium: 9.6 mg/dL (ref 8.4–10.5)
Chloride: 102 meq/L (ref 96–112)
Creatinine, Ser: 1.31 mg/dL — ABNORMAL HIGH (ref 0.40–1.20)
GFR: 35.25 mL/min — ABNORMAL LOW (ref >60.00)
Glucose, Bld: 102 mg/dL — ABNORMAL HIGH (ref 70–99)
Potassium: 4.7 meq/L (ref 3.5–5.1)
Sodium: 142 meq/L (ref 135–145)

## 2023-03-07 LAB — LIPID PANEL
Cholesterol: 185 mg/dL (ref 0–200)
HDL: 78.1 mg/dL (ref >39.00)
LDL Cholesterol: 88 mg/dL (ref 0–99)
NonHDL: 106.46
Total CHOL/HDL Ratio: 2
Triglycerides: 91 mg/dL (ref 0.0–149.0)
VLDL: 18.2 mg/dL (ref 0.0–40.0)

## 2023-03-07 LAB — HEPATIC FUNCTION PANEL
ALT: 8 U/L (ref 0–35)
AST: 15 U/L (ref 0–37)
Albumin: 4.4 g/dL (ref 3.5–5.2)
Alkaline Phosphatase: 68 U/L (ref 39–117)
Bilirubin, Direct: 0.1 mg/dL (ref 0.0–0.3)
Total Bilirubin: 0.5 mg/dL (ref 0.2–1.2)
Total Protein: 6.9 g/dL (ref 6.0–8.3)

## 2023-03-07 LAB — HEMOGLOBIN A1C: Hgb A1c MFr Bld: 6.5 % (ref 4.6–6.5)

## 2023-03-09 ENCOUNTER — Telehealth: Payer: Self-pay

## 2023-03-09 ENCOUNTER — Ambulatory Visit: Payer: Medicare HMO | Admitting: Internal Medicine

## 2023-03-09 NOTE — Progress Notes (Deleted)
 Subjective:    [name], female    DOB: 12/27/1928, 89 y.o.   MRN: [id number]  Patient here for No chief complaint on file.   HPI Here for a scheduled follow up - f/u regarding hypertension, hypercholesterolemia, cardiomyopathy and COPD. Had f/u with pulmonary 08/04/22. Breathing stable.  Cotinues Breo and albuterol prn. Saw Dr Sherron Monday - urge incontinence - has tried myrbetriq. Last seen - 05/17/22 - after trial of gemtesa. Per note, symptoms improved. She reports unsure if could tell a big difference, but she reports the medication was costly. Preferred not to continue.  Have discussed PFPT. Saw Dr Mariah Milling 04/20/22 - lasix qod and entrestro, imdur,aldactone and metoprolol.  Breathing stable.    Past Medical History:  Diagnosis Date   Asthma    Chronic systolic CHF (congestive heart failure) (HCC)    a. 01/2015 Echo: EF 25-30%, sev diff HK, mild to mod MR, mildly dil LA, nl RV fxn, PASP 61 mmHg.   Colon cancer (HCC) 2005   Hypercholesterolemia    Hypertension    Hypertensive heart disease with CHF (congestive heart failure) (HCC)    NICM (nonischemic cardiomyopathy) (HCC)    a. 01/2015 Echo: EF 25-30% sev diff HK;  b. 01/2015 Lexi MV: EF 19%, small defect of mild severity in apex - likely breast attenuation, no ischemia.   NSVT (nonsustained ventricular tachycardia) (HCC)    Osteoporosis    Past Surgical History:  Procedure Laterality Date   ABDOMINAL HYSTERECTOMY     BREAST BIOPSY Left 4/15`   BENIGN BREAST EPITHELIUM WITH NODULAR FIBROSIS.   INGUINAL HERNIA REPAIR Left 05/04/2016   Procedure: HERNIA REPAIR INGUINAL ADULT;  Surgeon: Earline Mayotte, MD;  Location: [facility] ORS;  Service: General;  Laterality: Left;   THYROIDECTOMY, PARTIAL  1956   TUBAL LIGATION     Family History  Problem Relation Age of Onset   Stroke Mother    Colon cancer Father    Breast cancer Daughter 60   Breast cancer Cousin    Social History   Socioeconomic History   Marital  status: Widowed    Spouse name: Not on file   Number of children: Not on file   Years of education: Not on file   Highest education level: Bachelor's degree (e.g., BA, AB, BS)  Occupational History   Not on file  Tobacco Use   Smoking status: Never   Smokeless tobacco: Never  Vaping Use   Vaping status: Never Used  Substance and Sexual Activity   Alcohol use: No    Alcohol/week: 0.0 standard drinks of alcohol   Drug use: No   Sexual activity: Not on file  Other Topics Concern   Not on file  Social History Narrative   Lives a lone   Social Drivers of Health   Financial Resource Strain: Low Risk  (12/01/2022)   Overall Financial Resource Strain (CARDIA)    Difficulty of Paying Living Expenses: Not hard at all  Food Insecurity: No Food Insecurity (12/01/2022)   Hunger Vital Sign    Worried About Running Out of Food in the Last Year: Never true    Ran Out of Food in the Last Year: Never true  Transportation Needs: No Transportation Needs (12/01/2022)   PRAPARE - Administrator, Civil Service (Medical): No    Lack of Transportation (Non-Medical): No  Physical Activity: Inactive (12/01/2022)   Exercise Vital Sign    Days of Exercise per Week: 0 days  Minutes of Exercise per Session: 0 min  Stress: No Stress Concern Present (12/01/2022)   [employer] of Occupational Health - Occupational Stress Questionnaire    Feeling of Stress : Not at all  Social Connections: Moderately Integrated (03/06/2023)   Social Connection and Isolation Panel [NHANES]    Frequency of Communication with Friends and Family: More than three times a week    Frequency of Social Gatherings with Friends and Family: More than three times a week    Attends Religious Services: More than 4 times per year    Active Member of [employer] or Organizations: Yes    Attends Banker Meetings: More than 4 times per year    Marital Status: Widowed     Review of Systems     Objective:      LMP 02/21/1966  Wt Readings from Last 3 Encounters:  12/01/22 130 lb 4 oz (59.1 kg)  11/04/22 128 lb (58.1 kg)  08/04/22 133 lb 6.4 oz (60.5 kg)    Physical Exam  {Perform Simple Foot Exam  Perform Detailed exam:1} {Insert foot Exam (Optional):30965}   Outpatient Encounter Medications as of 03/09/2023  Medication Sig   acetaminophen (TYLENOL) 325 MG tablet Take 650 mg by mouth every 6 (six) hours as needed.   albuterol (VENTOLIN HFA) 108 (90 Base) MCG/ACT inhaler Inhale 2 puffs into the lungs every 6 (six) hours as needed for wheezing or shortness of breath.   aspirin 81 MG EC tablet Take 81 mg by mouth daily. Swallow whole.   Cholecalciferol (VITAMIN D-3) 1000 UNITS CAPS Take 2,000 Units by mouth daily.   ezetimibe (ZETIA) 10 MG tablet Take 1 tablet (10 mg total) by mouth daily.   fluticasone (FLONASE) 50 MCG/ACT nasal spray Place 1 spray into both nostrils daily.   fluticasone furoate-vilanterol (BREO ELLIPTA) 100-25 MCG/ACT AEPB Inhale 1 puff into the lungs daily.   furosemide (LASIX) 40 MG tablet Take 1/2-1 tablet as directed per weight gain   isosorbide mononitrate (IMDUR) 30 MG 24 hr tablet Take 1 tablet (30 mg total) by mouth daily.   metoprolol succinate (TOPROL-XL) 25 MG 24 hr tablet Take 1 tablet (25 mg total) by mouth daily.   sacubitril-valsartan (ENTRESTO) 97-103 MG Take 1 tablet by mouth 2 (two) times daily.   senna-docusate (SENOKOT-S) 8.6-50 MG tablet Take 2 tablets by mouth 2 (two) times daily as needed for mild constipation.   spironolactone (ALDACTONE) 25 MG tablet Take 1 tablet (25 mg total) by mouth daily. PLEASE CALL [phone] TO SCHEDULE YEARLY APPOINTMENT. THANK YOU.   No facility-administered encounter medications on file as of 03/09/2023.     Lab Results  Component Value Date   WBC 3.8 (L) 11/04/2022   HGB 13.1 11/04/2022   HCT 40.9 11/04/2022   PLT 247.0 11/04/2022   GLUCOSE 102 (H) 03/07/2023   CHOL 185 03/07/2023   TRIG 91.0 03/07/2023   HDL  78.10 03/07/2023   LDLDIRECT 128.3 02/08/2013   LDLCALC 88 03/07/2023   ALT 8 03/07/2023   AST 15 03/07/2023   NA 142 03/07/2023   K 4.7 03/07/2023   CL 102 03/07/2023   CREATININE 1.31 (H) 03/07/2023   BUN 22 03/07/2023   CO2 32 03/07/2023   TSH 0.61 06/28/2022   INR 0.9 06/28/2019   HGBA1C 6.5 03/07/2023    MM 3D SCREENING MAMMOGRAM BILATERAL BREAST Result Date: 12/10/2022 CLINICAL DATA:  Screening. EXAM: DIGITAL SCREENING BILATERAL MAMMOGRAM WITH TOMOSYNTHESIS AND CAD TECHNIQUE: Bilateral screening digital craniocaudal and mediolateral oblique  mammograms were obtained. Bilateral screening digital breast tomosynthesis was performed. The images were evaluated with computer-aided detection. COMPARISON:  Previous exam(s). ACR Breast Density Category c: The breasts are heterogeneously dense, which may obscure small masses. FINDINGS: There are no findings suspicious for malignancy. IMPRESSION: No mammographic evidence of malignancy. A result letter of this screening mammogram will be mailed directly to the patient. RECOMMENDATION: Screening mammogram in one year as clinically indicated BI-RADS CATEGORY  1: Negative. Electronically Signed   By: Harmon Pier M.D.   On: 12/10/2022 08:49       Assessment & Plan:  There are no diagnoses linked to this encounter.   Dale Splendora, MD

## 2023-03-09 NOTE — Telephone Encounter (Signed)
Copied from CRM [id number]. Topic: Appointments - Appointment Cancel/Reschedule >> Mar 09, 2023  9:20 AM Kathy Dominguez wrote: Patient/patient representative is calling to cancel or reschedule an appointment. Refer to attachments for appointment information.    Patient cancelled today's appointment due to the weather, states she's uncomfortable driving in the rain & requesting Dominguez sooner appointment then the next available - April 25  I spoke with patient and rescheduled her visit from today to 03/18/2023 with Dr. Dale [place].

## 2023-03-18 ENCOUNTER — Ambulatory Visit: Payer: Medicare HMO | Admitting: Internal Medicine

## 2023-03-18 VITALS — BP 130/76 | HR 81 | Temp 97.9°F | Resp 16 | Ht 62.0 in | Wt 124.2 lb

## 2023-03-18 DIAGNOSIS — I10 Essential (primary) hypertension: Secondary | ICD-10-CM | POA: Diagnosis not present

## 2023-03-18 DIAGNOSIS — R739 Hyperglycemia, unspecified: Secondary | ICD-10-CM

## 2023-03-18 DIAGNOSIS — I4729 Other ventricular tachycardia: Secondary | ICD-10-CM

## 2023-03-18 DIAGNOSIS — I5022 Chronic systolic (congestive) heart failure: Secondary | ICD-10-CM

## 2023-03-18 DIAGNOSIS — J4489 Other specified chronic obstructive pulmonary disease: Secondary | ICD-10-CM

## 2023-03-18 DIAGNOSIS — N1832 Chronic kidney disease, stage 3b: Secondary | ICD-10-CM

## 2023-03-18 DIAGNOSIS — E7800 Pure hypercholesterolemia, unspecified: Secondary | ICD-10-CM

## 2023-03-18 DIAGNOSIS — I700 Atherosclerosis of aorta: Secondary | ICD-10-CM

## 2023-03-18 DIAGNOSIS — Z85038 Personal history of other malignant neoplasm of large intestine: Secondary | ICD-10-CM

## 2023-03-18 NOTE — Progress Notes (Signed)
 Subjective:    [name], female    DOB: 1928-10-29, 89 y.o.   MRN: [id number]  Patient here for  Chief Complaint  Patient presents with   Medical Management of Chronic Issues    HPI Here for a scheduled follow up.  Had f/u with pulmonary 08/04/22. Breathing stable.  Cotinues Breo and albuterol prn. Saw Dr Sherron Monday - urge incontinence - has tried myrbetriq. Last seen - 05/17/22 - after trial of gemtesa. Per discussion today, she did not feel made a significant difference. Discussed f/u. Wants to hold on any further intervention at this time. Saw Dr Mariah Milling 04/20/22 - lasix qod and entrestro, imdur,aldactone and metoprolol. Breathing stable.  No chest pain. Overall she feels she is doing well.    Past Medical History:  Diagnosis Date   Asthma    Chronic systolic CHF (congestive heart failure) (HCC)    a. 01/2015 Echo: EF 25-30%, sev diff HK, mild to mod MR, mildly dil LA, nl RV fxn, PASP 61 mmHg.   Colon cancer (HCC) 2005   Hypercholesterolemia    Hypertension    Hypertensive heart disease with CHF (congestive heart failure) (HCC)    NICM (nonischemic cardiomyopathy) (HCC)    a. 01/2015 Echo: EF 25-30% sev diff HK;  b. 01/2015 Lexi MV: EF 19%, small defect of mild severity in apex - likely breast attenuation, no ischemia.   NSVT (nonsustained ventricular tachycardia) (HCC)    Osteoporosis    Past Surgical History:  Procedure Laterality Date   ABDOMINAL HYSTERECTOMY     BREAST BIOPSY Left 4/15`   BENIGN BREAST EPITHELIUM WITH NODULAR FIBROSIS.   INGUINAL HERNIA REPAIR Left 05/04/2016   Procedure: HERNIA REPAIR INGUINAL ADULT;  Surgeon: Earline Mayotte, MD;  Location: [facility] ORS;  Service: General;  Laterality: Left;   THYROIDECTOMY, PARTIAL  1956   TUBAL LIGATION     Family History  Problem Relation Age of Onset   Stroke Mother    Colon cancer Father    Breast cancer Daughter 81   Breast cancer Cousin    Social History   Socioeconomic History   Marital  status: Widowed    Spouse name: Not on file   Number of children: Not on file   Years of education: Not on file   Highest education level: Bachelor's degree (e.g., BA, AB, BS)  Occupational History   Not on file  Tobacco Use   Smoking status: Never   Smokeless tobacco: Never  Vaping Use   Vaping status: Never Used  Substance and Sexual Activity   Alcohol use: No    Alcohol/week: 0.0 standard drinks of alcohol   Drug use: No   Sexual activity: Not on file  Other Topics Concern   Not on file  Social History Narrative   Lives a lone   Social Drivers of Health   Financial Resource Strain: Low Risk  (12/01/2022)   Overall Financial Resource Strain (CARDIA)    Difficulty of Paying Living Expenses: Not hard at all  Food Insecurity: No Food Insecurity (12/01/2022)   Hunger Vital Sign    Worried About Running Out of Food in the Last Year: Never true    Ran Out of Food in the Last Year: Never true  Transportation Needs: No Transportation Needs (12/01/2022)   PRAPARE - Administrator, Civil Service (Medical): No    Lack of Transportation (Non-Medical): No  Physical Activity: Inactive (12/01/2022)   Exercise Vital Sign    Days  of Exercise per Week: 0 days    Minutes of Exercise per Session: 0 min  Stress: No Stress Concern Present (12/01/2022)   [employer] of Occupational Health - Occupational Stress Questionnaire    Feeling of Stress : Not at all  Social Connections: Moderately Integrated (03/17/2023)   Social Connection and Isolation Panel [NHANES]    Frequency of Communication with Friends and Family: More than three times a week    Frequency of Social Gatherings with Friends and Family: More than three times a week    Attends Religious Services: More than 4 times per year    Active Member of [employer] or Organizations: Yes    Attends Banker Meetings: More than 4 times per year    Marital Status: Widowed     Review of Systems  Constitutional:   Negative for appetite change and unexpected weight change.  HENT:  Negative for congestion and sinus pressure.   Respiratory:  Negative for chest tightness.        Breathing stable. No increased cough or congestion.   Cardiovascular:  Negative for chest pain and palpitations.       No increased leg swelling.   Gastrointestinal:  Negative for abdominal pain, diarrhea, nausea and vomiting.  Genitourinary:  Negative for difficulty urinating and dysuria.  Musculoskeletal:  Negative for joint swelling and myalgias.  Skin:  Negative for color change and rash.  Neurological:  Negative for dizziness and headaches.  Psychiatric/Behavioral:  Negative for agitation and dysphoric mood.        Objective:     BP 130/76   Pulse 81   Temp 97.9 F (36.6 C)   Resp 16   Ht 5\' 2"  (1.575 m)   Wt 124 lb 3.2 oz (56.3 kg)   LMP 02/21/1966   SpO2 98%   BMI 22.72 kg/m  Wt Readings from Last 3 Encounters:  03/18/23 124 lb 3.2 oz (56.3 kg)  12/01/22 130 lb 4 oz (59.1 kg)  11/04/22 128 lb (58.1 kg)    Physical Exam Vitals reviewed.  Constitutional:      General: She is not in acute distress.    Appearance: Normal appearance.  HENT:     Head: Normocephalic and atraumatic.     Right Ear: External ear normal.     Left Ear: External ear normal.     Mouth/Throat:     Pharynx: No oropharyngeal exudate or posterior oropharyngeal erythema.  Eyes:     General: No scleral icterus.       Right eye: No discharge.        Left eye: No discharge.     Conjunctiva/sclera: Conjunctivae normal.  Neck:     Thyroid: No thyromegaly.  Cardiovascular:     Rate and Rhythm: Normal rate and regular rhythm.  Pulmonary:     Effort: No respiratory distress.     Breath sounds: Normal breath sounds. No wheezing.  Abdominal:     General: Bowel sounds are normal.     Palpations: Abdomen is soft.     Tenderness: There is no abdominal tenderness.  Musculoskeletal:        General: No swelling or tenderness.      Cervical back: Neck supple. No tenderness.  Lymphadenopathy:     Cervical: No cervical adenopathy.  Skin:    Findings: No erythema or rash.  Neurological:     Mental Status: She is alert.  Psychiatric:        Mood and Affect: Mood normal.  Behavior: Behavior normal.         Outpatient Encounter Medications as of 03/18/2023  Medication Sig   acetaminophen (TYLENOL) 325 MG tablet Take 650 mg by mouth every 6 (six) hours as needed.   albuterol (VENTOLIN HFA) 108 (90 Base) MCG/ACT inhaler Inhale 2 puffs into the lungs every 6 (six) hours as needed for wheezing or shortness of breath.   aspirin 81 MG EC tablet Take 81 mg by mouth daily. Swallow whole.   Cholecalciferol (VITAMIN D-3) 1000 UNITS CAPS Take 2,000 Units by mouth daily.   ezetimibe (ZETIA) 10 MG tablet Take 1 tablet (10 mg total) by mouth daily.   fluticasone (FLONASE) 50 MCG/ACT nasal spray Place 1 spray into both nostrils daily.   fluticasone furoate-vilanterol (BREO ELLIPTA) 100-25 MCG/ACT AEPB Inhale 1 puff into the lungs daily.   furosemide (LASIX) 40 MG tablet Take 1/2-1 tablet as directed per weight gain   isosorbide mononitrate (IMDUR) 30 MG 24 hr tablet Take 1 tablet (30 mg total) by mouth daily.   metoprolol succinate (TOPROL-XL) 25 MG 24 hr tablet Take 1 tablet (25 mg total) by mouth daily.   sacubitril-valsartan (ENTRESTO) 97-103 MG Take 1 tablet by mouth 2 (two) times daily.   senna-docusate (SENOKOT-S) 8.6-50 MG tablet Take 2 tablets by mouth 2 (two) times daily as needed for mild constipation.   spironolactone (ALDACTONE) 25 MG tablet Take 1 tablet (25 mg total) by mouth daily. PLEASE CALL [phone] TO SCHEDULE YEARLY APPOINTMENT. THANK YOU.   No facility-administered encounter medications on file as of 03/18/2023.     Lab Results  Component Value Date   WBC 3.8 (L) 11/04/2022   HGB 13.1 11/04/2022   HCT 40.9 11/04/2022   PLT 247.0 11/04/2022   GLUCOSE 102 (H) 03/07/2023   CHOL 185 03/07/2023    TRIG 91.0 03/07/2023   HDL 78.10 03/07/2023   LDLDIRECT 128.3 02/08/2013   LDLCALC 88 03/07/2023   ALT 8 03/07/2023   AST 15 03/07/2023   NA 142 03/07/2023   K 4.7 03/07/2023   CL 102 03/07/2023   CREATININE 1.31 (H) 03/07/2023   BUN 22 03/07/2023   CO2 32 03/07/2023   TSH 0.61 06/28/2022   INR 0.9 06/28/2019   HGBA1C 6.5 03/07/2023    MM 3D SCREENING MAMMOGRAM BILATERAL BREAST Result Date: 12/10/2022 CLINICAL DATA:  Screening. EXAM: DIGITAL SCREENING BILATERAL MAMMOGRAM WITH TOMOSYNTHESIS AND CAD TECHNIQUE: Bilateral screening digital craniocaudal and mediolateral oblique mammograms were obtained. Bilateral screening digital breast tomosynthesis was performed. The images were evaluated with computer-aided detection. COMPARISON:  Previous exam(s). ACR Breast Density Category c: The breasts are heterogeneously dense, which may obscure small masses. FINDINGS: There are no findings suspicious for malignancy. IMPRESSION: No mammographic evidence of malignancy. A result letter of this screening mammogram will be mailed directly to the patient. RECOMMENDATION: Screening mammogram in one year as clinically indicated BI-RADS CATEGORY  1: Negative. Electronically Signed   By: Harmon Pier M.D.   On: 12/10/2022 08:49       Assessment & Plan:  Aortic atherosclerosis (HCC) Assessment & Plan: Statin intolerance. Continue zetia.    Hypercholesterolemia Assessment & Plan: Intolerance to statin (crestor). On zetia. Follow lipid panel.   Lab Results  Component Value Date   CHOL 185 03/07/2023   HDL 78.10 03/07/2023   LDLCALC 88 03/07/2023   LDLDIRECT 128.3 02/08/2013   TRIG 91.0 03/07/2023   CHOLHDL 2 03/07/2023     Orders: -     Lipid panel; Future -  Hepatic function panel; Future -     Basic metabolic panel; Future  Hyperglycemia Assessment & Plan: Follow met b and A1c.   Orders: -     Hemoglobin A1c; Future  Chronic systolic heart failure (HCC) Assessment & Plan: Continue  metoprolol, spironolactone, entrestro and imdur. Has lasix. Weight has been stable. Continue to weigh daily. No evidence of volume overload on exam.    Stage 3b chronic kidney disease (HCC) Assessment & Plan: Continue to avoid antiinflammatory medication. Follow metabolic panel.    COPD with asthma (HCC) Assessment & Plan: Breathing stable. Continue Breo. Has albuterol to use prn.    Essential hypertension Assessment & Plan: Continue aldactone, entrestro, metoprolol and has lasix. Pressure as outlined. No changes today. Follow pressures.  Follow metabolic panel.    History of colon cancer Assessment & Plan: Being followed by oncology. PET ok. Follow.    NSVT (nonsustained ventricular tachycardia) (HCC) Assessment & Plan: Documented per cardiology. Continue metoprolol. Stable.       Dale Nilwood, MD

## 2023-03-21 ENCOUNTER — Encounter: Payer: Self-pay | Admitting: Internal Medicine

## 2023-03-21 NOTE — Assessment & Plan Note (Signed)
 Being followed by oncology. PET ok. Follow.

## 2023-03-21 NOTE — Assessment & Plan Note (Signed)
 Statin intolerance. Continue zetia.

## 2023-03-21 NOTE — Assessment & Plan Note (Signed)
 Intolerance to statin (crestor). On zetia. Follow lipid panel.   Lab Results  Component Value Date   CHOL 185 03/07/2023   HDL 78.10 03/07/2023   LDLCALC 88 03/07/2023   LDLDIRECT 128.3 02/08/2013   TRIG 91.0 03/07/2023   CHOLHDL 2 03/07/2023

## 2023-03-21 NOTE — Assessment & Plan Note (Signed)
 Continue metoprolol, spironolactone, entrestro and imdur. Has lasix. Weight has been stable. Continue to weigh daily. No evidence of volume overload on exam.

## 2023-03-21 NOTE — Assessment & Plan Note (Signed)
 Continue to avoid antiinflammatory medication. Follow metabolic panel.

## 2023-03-21 NOTE — Assessment & Plan Note (Signed)
 Follow met b and A1c.

## 2023-03-21 NOTE — Assessment & Plan Note (Signed)
 Documented per cardiology. Continue metoprolol. Stable.

## 2023-03-21 NOTE — Assessment & Plan Note (Signed)
 Continue aldactone, entrestro, metoprolol and has lasix. Pressure as outlined. No changes today. Follow pressures.  Follow metabolic panel.

## 2023-03-21 NOTE — Assessment & Plan Note (Signed)
 Breathing stable. Continue Breo. Has albuterol to use prn.

## 2023-04-25 ENCOUNTER — Other Ambulatory Visit: Payer: Self-pay | Admitting: Cardiovascular Disease

## 2023-05-10 ENCOUNTER — Other Ambulatory Visit: Payer: Self-pay | Admitting: Cardiovascular Disease

## 2023-05-16 ENCOUNTER — Ambulatory Visit: Payer: Self-pay | Admitting: Urology

## 2023-05-16 VITALS — BP 131/77 | HR 75

## 2023-05-16 DIAGNOSIS — N3946 Mixed incontinence: Secondary | ICD-10-CM

## 2023-05-16 DIAGNOSIS — N3941 Urge incontinence: Secondary | ICD-10-CM

## 2023-05-16 LAB — URINALYSIS, COMPLETE
Bilirubin, UA: NEGATIVE
Glucose, UA: NEGATIVE
Ketones, UA: NEGATIVE
Nitrite, UA: NEGATIVE
Protein,UA: NEGATIVE
RBC, UA: NEGATIVE
Specific Gravity, UA: <1.005 — ABNORMAL LOW (ref 1.005–1.030)
Urobilinogen, Ur: 0.2 mg/dL (ref 0.2–1.0)
pH, UA: 5.5 (ref 5.0–7.5)

## 2023-05-16 LAB — MICROSCOPIC EXAMINATION: Epithelial Cells (non renal): >10 /HPF — AB (ref 0–10)

## 2023-05-16 NOTE — Progress Notes (Signed)
 05/16/2023 2:05 PM   Kathy Dominguez [birth year] [id number]  Referring provider: Dellar Fenton, MD [address] [place],  [place] [address]  No chief complaint on file.   HPI: I was consulted to assist the patient's urgency incontinence.  She can have foot on the floor syndrome.  I think she wears 1 or 2 pads a day but it was difficult to quantitate.  No stress incontinence or bedwetting   She voids approximately every hour and every 2 hours at night   She has had a TIA and a hysterectomy     Modest improvement with Myrbetriq  but still having urgency and some leaking.     Very narrow introitus with no prolapse or stress incontinence   Cystoscopy: Normal   Patient was a partial responder to Myrbetriq . Reassess in 6 weeks on Gemtesa  samples and prescription and proceed accordingly. Percutaneous tibial nerve stimulation likely a very good option and we can always go back on the Myrbetriq     Urge incontinence and less urgency.  Clinically not infected     Reassess in 4 months on Gemtesa .  He is about age and side effects etc. think that is a good choice for her.  We can always talk about percutaneous tibial nerve stimulation in the future and/or antimuscarinics    Today Urgency incontinence dramatically better.  No infections.  Frequency stable.  She wants to add on Gemtesa  reassess in 1 year on Gemtesa   Today Patient stopped the Gemtesa  since it was $110.  She really did not see a big benefit.  I went over antimuscarinics and percutaneous tibial nerve stimulation with full template.  Clinically not infected.    PMH: Past Medical History:  Diagnosis Date   Asthma    Chronic systolic CHF (congestive heart failure) (HCC)    a. 01/2015 Echo: EF 25-30%, sev diff HK, mild to mod MR, mildly dil LA, nl RV fxn, PASP 61 mmHg.   Colon cancer (HCC) 2005   Hypercholesterolemia    Hypertension    Hypertensive heart disease with CHF (congestive heart failure)  (HCC)    NICM (nonischemic cardiomyopathy) (HCC)    a. 01/2015 Echo: EF 25-30% sev diff HK;  b. 01/2015 Lexi MV: EF 19%, small defect of mild severity in apex - likely breast attenuation, no ischemia.   NSVT (nonsustained ventricular tachycardia) (HCC)    Osteoporosis     Surgical History: Past Surgical History:  Procedure Laterality Date   ABDOMINAL HYSTERECTOMY     BREAST BIOPSY Left 4/15`   BENIGN BREAST EPITHELIUM WITH NODULAR FIBROSIS.   INGUINAL HERNIA REPAIR Left 05/04/2016   Procedure: HERNIA REPAIR INGUINAL ADULT;  Surgeon: Marshall Skeeter, MD;  Location: [facility] ORS;  Service: General;  Laterality: Left;   THYROIDECTOMY, PARTIAL  1956   TUBAL LIGATION      Home Medications:  Allergies as of 05/16/2023   No Known Allergies      Medication List        Accurate as of May 16, 2023  2:05 PM. If you have any questions, ask your nurse or doctor.          acetaminophen  325 MG tablet Commonly known as: TYLENOL  Take 650 mg by mouth every 6 (six) hours as needed.   albuterol  108 (90 Base) MCG/ACT inhaler Commonly known as: VENTOLIN  HFA Inhale 2 puffs into the lungs every 6 (six) hours as needed for wheezing or shortness of breath.   aspirin  EC 81 MG tablet Take 81  mg by mouth daily. Swallow whole.   Entresto  97-103 MG Generic drug: sacubitril -valsartan  Take 1 tablet by mouth 2 (two) times daily.   ezetimibe  10 MG tablet Commonly known as: Zetia  Take 1 tablet (10 mg total) by mouth daily.   fluticasone  50 MCG/ACT nasal spray Commonly known as: FLONASE  Place 1 spray into both nostrils daily.   fluticasone  furoate-vilanterol 100-25 MCG/ACT Aepb Commonly known as: Breo Ellipta  Inhale 1 puff into the lungs daily.   furosemide  40 MG tablet Commonly known as: LASIX  Take 1/2-1 tablet as directed per weight gain   isosorbide  mononitrate 30 MG 24 hr tablet Commonly known as: IMDUR  Take 1 tablet (30 mg total) by mouth daily.   metoprolol  succinate 25 MG 24 hr  tablet Commonly known as: TOPROL -XL Take 1 tablet (25 mg total) by mouth daily.   senna-docusate 8.6-50 MG tablet Commonly known as: Senokot-S Take 2 tablets by mouth 2 (two) times daily as needed for mild constipation.   spironolactone  25 MG tablet Commonly known as: ALDACTONE  Take 1 tablet (25 mg total) by mouth daily.   Vitamin D -3 25 MCG (1000 UT) Caps Take 2,000 Units by mouth daily.        Allergies: No Known Allergies  Family History: Family History  Problem Relation Age of Onset   Stroke Mother    Colon cancer Father    Breast cancer Daughter 11   Breast cancer Cousin     Social History:  reports that she has never smoked. She has never used smokeless tobacco. She reports that she does not drink alcohol and does not use drugs.  ROS:                                        Physical Exam: LMP 02/21/1966   Constitutional:  Alert and oriented, No acute distress. HEENT: [place] AT, moist mucus membranes.  Trachea midline, no masses.   Laboratory Data: Lab Results  Component Value Date   WBC 3.8 (L) 11/04/2022   HGB 13.1 11/04/2022   HCT 40.9 11/04/2022   MCV 92.8 11/04/2022   PLT 247.0 11/04/2022    Lab Results  Component Value Date   CREATININE 1.31 (H) 03/07/2023    No results found for: "PSA"  No results found for: "TESTOSTERONE"  Lab Results  Component Value Date   HGBA1C 6.5 03/07/2023    Urinalysis    Component Value Date/Time   COLORURINE YELLOW (A) 06/28/2019 0813   APPEARANCEUR Clear 01/11/2022 1416   LABSPEC 1.041 (H) 06/28/2019 0813   PHURINE 6.0 06/28/2019 0813   GLUCOSEU Negative 01/11/2022 1416   GLUCOSEU NEGATIVE 02/20/2015 0945   HGBUR NEGATIVE 06/28/2019 0813   BILIRUBINUR Negative 01/11/2022 1416   KETONESUR 5 (A) 06/28/2019 0813   PROTEINUR Negative 01/11/2022 1416   PROTEINUR NEGATIVE 06/28/2019 0813   UROBILINOGEN 0.2 02/20/2015 0945   NITRITE Negative 01/11/2022 1416   NITRITE NEGATIVE  06/28/2019 0813   LEUKOCYTESUR Negative 01/11/2022 1416   LEUKOCYTESUR LARGE (A) 06/28/2019 0813    Pertinent Imaging:   Assessment & Plan: Patient chose watchful waiting.  See as needed.  I think this is good choice  There are no diagnoses linked to this encounter.  No follow-ups on file.  Devorah Fonder, MD  [facility] [address], [address] [place], [place] [address] [phone]

## 2023-06-01 ENCOUNTER — Other Ambulatory Visit: Payer: Self-pay | Admitting: Cardiovascular Disease

## 2023-06-13 NOTE — Progress Notes (Signed)
 Cardiology Office Note  Date:  06/14/2023   ID:  Kathy Dominguez, DOB [birth year], MRN [id number]  PCP:  Dellar Fenton, MD   Chief Complaint  Patient presents with   12 month follow up     "Doing well."    HPI:  89 y/o ? with a h/o  HTN,   [facility] 01/28/2016 for shortness of breath,  diagnosed with acute systolic CHF , nonischemic 01/2016, ejection fraction 25-35%,  myoview  showing no ischemia,  echo EF 50 to 55% 09/2016 who presents for follow-up  of her chronic systolic CHF   Last seen in clinic by myself 3/24 Doing well, denies significant shortness of breath or chest discomfort No significant PND orthopnea no weight gain no leg swelling Has not received entresto  in the mail  BP low today, no orthostasis, 105 systolic, recheck, 100 systolic with the nurses Weight has been down, has not been taking lasix  Renal function stable  Chronic knee pain, knee is swollen, Does not like to travel Sedentary  Tried statin per primary care, had side effects, had to stop the medication  CT scan 2108: mild to moderate PAD, aortic athero, particularly in iliac vessels  EKG personally reviewed by myself on todays visit EKG Interpretation Date/Time:  Tuesday Jun 14 2023 09:24:16 EDT Ventricular Rate:  72 PR Interval:  144 QRS Duration:  88 QT Interval:  418 QTC Calculation: 457 R Axis:   -55  Text Interpretation: Normal sinus rhythm Left anterior fascicular block Minimal voltage criteria for LVH, may be normal variant ( Cornell product ) When compared with ECG of 23-Nov-2019 14:46, Left anterior fascicular block is now Present QT has lengthened Confirmed by Belva Boyden [id number]) on 06/14/2023 9:46:48 AM    PMH:   has a past medical history of Asthma, Chronic systolic CHF (congestive heart failure) (HCC), Colon cancer (HCC) (2005), Hypercholesterolemia, Hypertension, Hypertensive heart disease with CHF (congestive heart failure) (HCC), NICM (nonischemic cardiomyopathy) (HCC), NSVT  (nonsustained ventricular tachycardia) (HCC), and Osteoporosis.  PSH:    Past Surgical History:  Procedure Laterality Date   ABDOMINAL HYSTERECTOMY     BREAST BIOPSY Left 4/15`   BENIGN BREAST EPITHELIUM WITH NODULAR FIBROSIS.   INGUINAL HERNIA REPAIR Left 05/04/2016   Procedure: HERNIA REPAIR INGUINAL ADULT;  Surgeon: Marshall Skeeter, MD;  Location: [facility] ORS;  Service: General;  Laterality: Left;   THYROIDECTOMY, PARTIAL  1956   TUBAL LIGATION      Current Outpatient Medications  Medication Sig Dispense Refill   acetaminophen  (TYLENOL ) 325 MG tablet Take 650 mg by mouth every 6 (six) hours as needed.     albuterol  (VENTOLIN  HFA) 108 (90 Base) MCG/ACT inhaler Inhale 2 puffs into the lungs every 6 (six) hours as needed for wheezing or shortness of breath. 8.5 g 2   aspirin  81 MG EC tablet Take 81 mg by mouth daily. Swallow whole.     Cholecalciferol  (VITAMIN D -3) 1000 UNITS CAPS Take 2,000 Units by mouth daily.     ezetimibe  (ZETIA ) 10 MG tablet Take 1 tablet (10 mg total) by mouth daily. 90 tablet 1   fluticasone  (FLONASE ) 50 MCG/ACT nasal spray Place 1 spray into both nostrils daily. 16 g 2   fluticasone  furoate-vilanterol (BREO ELLIPTA ) 100-25 MCG/ACT AEPB Inhale 1 puff into the lungs daily. 60 each 5   furosemide  (LASIX ) 40 MG tablet Take 1/2-1 tablet as directed per weight gain 90 tablet 1   isosorbide  mononitrate (IMDUR ) 30 MG 24 hr tablet Take 1 tablet (30 mg total)  by mouth daily. 30 tablet 3   metoprolol  succinate (TOPROL -XL) 25 MG 24 hr tablet Take 1 tablet (25 mg total) by mouth daily. 90 tablet 1   sacubitril -valsartan  (ENTRESTO ) 97-103 MG Take 1 tablet by mouth 2 (two) times daily. 180 tablet 1   senna-docusate (SENOKOT-S) 8.6-50 MG tablet Take 2 tablets by mouth 2 (two) times daily as needed for mild constipation. 60 tablet 0   spironolactone  (ALDACTONE ) 25 MG tablet Take 1 tablet (25 mg total) by mouth daily. 90 tablet 0   No current facility-administered medications for  this visit.    Allergies:   Patient has no known allergies.   Social History:  The patient  reports that she has never smoked. She has never used smokeless tobacco. She reports that she does not drink alcohol and does not use drugs.   Family History:   family history includes Breast cancer in her cousin; Breast cancer (age of onset: 82) in her daughter; Colon cancer in her father; Stroke in her mother.   Review of Systems: Review of Systems  Constitutional: Negative.   Respiratory: Negative.    Cardiovascular: Negative.   Gastrointestinal: Negative.   Musculoskeletal:  Positive for joint pain.  Neurological: Negative.   Psychiatric/Behavioral: Negative.    All other systems reviewed and are negative.   PHYSICAL EXAM: VS:  BP 100/60 (BP Location: Left Arm, Patient Position: Sitting, Cuff Size: Normal)   Pulse 72   Ht 5\' 2"  (1.575 m)   Wt 124 lb 8 oz (56.5 kg)   LMP 02/21/1966   SpO2 93%   BMI 22.77 kg/m  , BMI Body mass index is 22.77 kg/m. Constitutional:  oriented to person, place, and time. No distress.  HENT:  Head: Grossly normal Eyes:  no discharge. No scleral icterus.  Neck: No JVD, no carotid bruits  Cardiovascular: Regular rate and rhythm, no murmurs appreciated Pulmonary/Chest: Clear to auscultation bilaterally, no wheezes or rales Abdominal: Soft.  no distension.  no tenderness.  Musculoskeletal: Normal range of motion Neurological:  normal muscle tone. Coordination normal. No atrophy Skin: Skin warm and dry Psychiatric: normal affect, pleasant  Recent Labs: 06/28/2022: TSH 0.61 11/04/2022: Hemoglobin 13.1; Platelets 247.0 03/07/2023: ALT 8; BUN 22; Creatinine, Ser 1.31; Potassium 4.7; Sodium 142   Lipid Panel Lab Results  Component Value Date   CHOL 185 03/07/2023   HDL 78.10 03/07/2023   LDLCALC 88 03/07/2023   TRIG 91.0 03/07/2023      Wt Readings from Last 3 Encounters:  06/14/23 124 lb 8 oz (56.5 kg)  03/18/23 124 lb 3.2 oz (56.3 kg)  12/01/22  130 lb 4 oz (59.1 kg)     ASSESSMENT AND PLAN:  Chronic systolic heart failure (HCC) Normalized ejection fraction 2019 Taking Lasix  sparingly Blood pressure low, will decrease Entresto  down to 49/51 mg twice daily, continue metoprolol  succinate 25 daily, spironolactone  25 daily  Essential hypertension Blood pressure low, changes as above  Hypercholesterolemia statin myalgias on Crestor  Continue Zetia   PAD/aortic atherosclerosis Mild to moderate in nature seen on CT scan 2018,   most notably in the distal aorta, iliac vessels Statin intolerance Continue Zetia   Joint pain  stable    Orders Placed This Encounter  Procedures   EKG 12-Lead     Signed, Juanda Noon, M.D., Ph.D. 06/14/2023  [facility] [place], [place] [phone]

## 2023-06-14 ENCOUNTER — Ambulatory Visit: Attending: Cardiovascular Disease | Admitting: Cardiovascular Disease

## 2023-06-14 ENCOUNTER — Encounter: Payer: Self-pay | Admitting: Cardiovascular Disease

## 2023-06-14 VITALS — BP 100/60 | HR 72 | Ht 62.0 in | Wt 124.5 lb

## 2023-06-14 DIAGNOSIS — I10 Essential (primary) hypertension: Secondary | ICD-10-CM

## 2023-06-14 DIAGNOSIS — I5022 Chronic systolic (congestive) heart failure: Secondary | ICD-10-CM

## 2023-06-14 DIAGNOSIS — I110 Hypertensive heart disease with heart failure: Secondary | ICD-10-CM

## 2023-06-14 DIAGNOSIS — E785 Hyperlipidemia, unspecified: Secondary | ICD-10-CM | POA: Diagnosis not present

## 2023-06-14 DIAGNOSIS — I739 Peripheral vascular disease, unspecified: Secondary | ICD-10-CM

## 2023-06-14 DIAGNOSIS — M791 Myalgia, unspecified site: Secondary | ICD-10-CM

## 2023-06-14 DIAGNOSIS — T466X5D Adverse effect of antihyperlipidemic and antiarteriosclerotic drugs, subsequent encounter: Secondary | ICD-10-CM

## 2023-06-14 DIAGNOSIS — I4729 Other ventricular tachycardia: Secondary | ICD-10-CM

## 2023-06-14 DIAGNOSIS — I700 Atherosclerosis of aorta: Secondary | ICD-10-CM

## 2023-06-14 DIAGNOSIS — I428 Other cardiomyopathies: Secondary | ICD-10-CM

## 2023-06-14 DIAGNOSIS — J4489 Other specified chronic obstructive pulmonary disease: Secondary | ICD-10-CM

## 2023-06-14 MED ORDER — ENTRESTO 49-51 MG PO TABS: 1.0000 | ORAL_TABLET | Freq: Two times a day (BID) | ORAL | Status: AC

## 2023-06-14 NOTE — Patient Instructions (Addendum)
 Call [employer] [phone]  to confirm mailing address for entresto   Medication Instructions:  Please decrease the entresto  down to 49/51 mg twice a day  Lasix  as needed for ankle swelling  If you need a refill on your cardiac medications before your next appointment, please call your pharmacy.   Lab work: No new labs needed  Testing/Procedures: No new testing needed  Follow-Up: At [facility], you and your health needs are our priority.  As part of our continuing mission to provide you with exceptional heart care, we have created designated Provider Care Teams.  These Care Teams include your primary Cardiologist (physician) and Advanced Practice Providers (APPs -  Physician Assistants and Nurse Practitioners) who all work together to provide you with the care you need, when you need it.  You will need a follow up appointment in 12 months  Providers on your designated Care Team:   Laneta Pintos, NP Varney Gentleman, PA-C Cadence Gennaro Khat, [place]  COVID-19 Vaccine Information can be found at: [email or web address] For questions related to vaccine distribution or appointments, please email [email or web address] or call [phone].

## 2023-07-06 ENCOUNTER — Other Ambulatory Visit: Payer: Self-pay | Admitting: Cardiovascular Disease

## 2023-07-14 ENCOUNTER — Ambulatory Visit: Payer: Self-pay | Admitting: Internal Medicine

## 2023-07-14 ENCOUNTER — Other Ambulatory Visit (INDEPENDENT_AMBULATORY_CARE_PROVIDER_SITE_OTHER): Payer: Medicare HMO

## 2023-07-14 DIAGNOSIS — E7800 Pure hypercholesterolemia, unspecified: Secondary | ICD-10-CM

## 2023-07-14 DIAGNOSIS — R739 Hyperglycemia, unspecified: Secondary | ICD-10-CM

## 2023-07-14 LAB — LIPID PANEL
Cholesterol: 196 mg/dL (ref 0–200)
HDL: 79.7 mg/dL (ref >39.00)
LDL Cholesterol: 104 mg/dL — ABNORMAL HIGH (ref 0–99)
NonHDL: 116.16
Total CHOL/HDL Ratio: 2
Triglycerides: 60 mg/dL (ref 0.0–149.0)
VLDL: 12 mg/dL (ref 0.0–40.0)

## 2023-07-14 LAB — BASIC METABOLIC PANEL WITH GFR
BUN: 19 mg/dL (ref 6–23)
CO2: 34 meq/L — ABNORMAL HIGH (ref 19–32)
Calcium: 9.8 mg/dL (ref 8.4–10.5)
Chloride: 101 meq/L (ref 96–112)
Creatinine, Ser: 1.1 mg/dL (ref 0.40–1.20)
GFR: 43.37 mL/min — ABNORMAL LOW (ref >60.00)
Glucose, Bld: 101 mg/dL — ABNORMAL HIGH (ref 70–99)
Potassium: 4.4 meq/L (ref 3.5–5.1)
Sodium: 141 meq/L (ref 135–145)

## 2023-07-14 LAB — HEPATIC FUNCTION PANEL
ALT: 9 U/L (ref 0–35)
AST: 17 U/L (ref 0–37)
Albumin: 4.3 g/dL (ref 3.5–5.2)
Alkaline Phosphatase: 61 U/L (ref 39–117)
Bilirubin, Direct: 0.2 mg/dL (ref 0.0–0.3)
Total Bilirubin: 0.5 mg/dL (ref 0.2–1.2)
Total Protein: 7.4 g/dL (ref 6.0–8.3)

## 2023-07-14 LAB — HEMOGLOBIN A1C: Hgb A1c MFr Bld: 6.2 % (ref 4.6–6.5)

## 2023-07-18 ENCOUNTER — Encounter: Payer: Self-pay | Admitting: Internal Medicine

## 2023-07-18 ENCOUNTER — Ambulatory Visit (INDEPENDENT_AMBULATORY_CARE_PROVIDER_SITE_OTHER): Payer: Medicare HMO | Admitting: Internal Medicine

## 2023-07-18 VITALS — BP 124/70 | HR 72 | Temp 98.2°F | Resp 16 | Ht 62.0 in | Wt 125.2 lb

## 2023-07-18 DIAGNOSIS — E7800 Pure hypercholesterolemia, unspecified: Secondary | ICD-10-CM | POA: Diagnosis not present

## 2023-07-18 DIAGNOSIS — I428 Other cardiomyopathies: Secondary | ICD-10-CM

## 2023-07-18 DIAGNOSIS — I10 Essential (primary) hypertension: Secondary | ICD-10-CM

## 2023-07-18 DIAGNOSIS — N1832 Chronic kidney disease, stage 3b: Secondary | ICD-10-CM

## 2023-07-18 DIAGNOSIS — R739 Hyperglycemia, unspecified: Secondary | ICD-10-CM | POA: Diagnosis not present

## 2023-07-18 DIAGNOSIS — Z85038 Personal history of other malignant neoplasm of large intestine: Secondary | ICD-10-CM

## 2023-07-18 DIAGNOSIS — I5022 Chronic systolic (congestive) heart failure: Secondary | ICD-10-CM | POA: Diagnosis not present

## 2023-07-18 DIAGNOSIS — I700 Atherosclerosis of aorta: Secondary | ICD-10-CM

## 2023-07-18 DIAGNOSIS — M25561 Pain in right knee: Secondary | ICD-10-CM

## 2023-07-18 NOTE — Progress Notes (Signed)
 Subjective:    [name], female    DOB: 08/03/1928, 89 y.o.   MRN: [id number]  Patient here for  Chief Complaint  Patient presents with   Medical Management of Chronic Issues    HPI Here for a scheduled follow up follow up regarding hypertension, hypercholesterolemia and cardiomyopathy. Had f/u with pulmonary 08/04/22. Breathing stable.  Cotinues Breo and albuterol  prn. Saw Dr Gaston - urge incontinence - has tried myrbetriq . Last seen - 05/16/23 - reported urge incontinence - not a significant change on gemtesa . Saw Dr Gollan 06/14/23 - blood pressure low. Recommended to decrease entrestro to 49/51 bid. Continue metoprolol  and spironolactone .  She feels she is due well.  Breathing stable.  No chest pain.  No abdominal pain.  Bowels are moving.  She has noticed a little soft stool when she urinates.  No actual diarrhea.  No loss of control of her bowels.  Eating well.   Past Medical History:  Diagnosis Date   Asthma    Chronic systolic CHF (congestive heart failure) (HCC)    a. 01/2015 Echo: EF 25-30%, sev diff HK, mild to mod MR, mildly dil LA, nl RV fxn, PASP 61 mmHg.   Colon cancer (HCC) 2005   Hypercholesterolemia    Hypertension    Hypertensive heart disease with CHF (congestive heart failure) (HCC)    NICM (nonischemic cardiomyopathy) (HCC)    a. 01/2015 Echo: EF 25-30% sev diff HK;  b. 01/2015 Lexi MV: EF 19%, small defect of mild severity in apex - likely breast attenuation, no ischemia.   NSVT (nonsustained ventricular tachycardia) (HCC)    Osteoporosis    Past Surgical History:  Procedure Laterality Date   ABDOMINAL HYSTERECTOMY     BREAST BIOPSY Left 4/15`   BENIGN BREAST EPITHELIUM WITH NODULAR FIBROSIS.   INGUINAL HERNIA REPAIR Left 05/04/2016   Procedure: HERNIA REPAIR INGUINAL ADULT;  Surgeon: Reyes LELON Cota, MD;  Location: [facility] ORS;  Service: General;  Laterality: Left;   THYROIDECTOMY, PARTIAL  1956   TUBAL LIGATION     Family History   Problem Relation Age of Onset   Stroke Mother    Colon cancer Father    Breast cancer Daughter 42   Breast cancer Cousin    Social History   Socioeconomic History   Marital status: Widowed    Spouse name: Not on file   Number of children: Not on file   Years of education: Not on file   Highest education level: Bachelor's degree (e.g., BA, AB, BS)  Occupational History   Not on file  Tobacco Use   Smoking status: Never   Smokeless tobacco: Never  Vaping Use   Vaping status: Never Used  Substance and Sexual Activity   Alcohol use: No    Alcohol/week: 0.0 standard drinks of alcohol   Drug use: No   Sexual activity: Not on file  Other Topics Concern   Not on file  Social History Narrative   Lives a lone   Social Drivers of Health   Financial Resource Strain: Low Risk  (12/01/2022)   Overall Financial Resource Strain (CARDIA)    Difficulty of Paying Living Expenses: Not hard at all  Food Insecurity: No Food Insecurity (12/01/2022)   Hunger Vital Sign    Worried About Running Out of Food in the Last Year: Never true    Ran Out of Food in the Last Year: Never true  Transportation Needs: No Transportation Needs (07/17/2023)   PRAPARE - Transportation  Lack of Transportation (Medical): No    Lack of Transportation (Non-Medical): No  Physical Activity: Inactive (07/17/2023)   Exercise Vital Sign    Days of Exercise per Week: 0 days    Minutes of Exercise per Session: Not on file  Stress: No Stress Concern Present (07/17/2023)   [employer] of Occupational Health - Occupational Stress Questionnaire    Feeling of Stress: Not at all  Social Connections: Unknown (07/17/2023)   Social Connection and Isolation Panel    Frequency of Communication with Friends and Family: Not on file    Frequency of Social Gatherings with Friends and Family: Not on file    Attends Religious Services: Not on file    Active Member of Clubs or Organizations: Not on file    Attends Tax inspector Meetings: Not on file    Marital Status: Widowed     Review of Systems  Constitutional:  Negative for appetite change and unexpected weight change.  HENT:  Negative for congestion and sinus pressure.   Respiratory:  Negative for cough, chest tightness and shortness of breath.   Cardiovascular:  Negative for chest pain, palpitations and leg swelling.  Gastrointestinal:  Negative for abdominal pain, diarrhea, nausea and vomiting.  Genitourinary:  Negative for difficulty urinating and dysuria.  Musculoskeletal:  Negative for joint swelling and myalgias.       Knee pain - chronic.   Skin:  Negative for color change and rash.  Neurological:  Negative for dizziness and headaches.  Psychiatric/Behavioral:  Negative for agitation and dysphoric mood.        Objective:     BP 124/70   Pulse 72   Temp 98.2 F (36.8 C)   Resp 16   Ht 5' 2 (1.575 m)   Wt 125 lb 3.2 oz (56.8 kg)   LMP 02/21/1966   SpO2 98%   BMI 22.90 kg/m  Wt Readings from Last 3 Encounters:  07/18/23 125 lb 3.2 oz (56.8 kg)  06/14/23 124 lb 8 oz (56.5 kg)  03/18/23 124 lb 3.2 oz (56.3 kg)    Physical Exam Vitals reviewed.  Constitutional:      General: She is not in acute distress.    Appearance: Normal appearance.  HENT:     Head: Normocephalic and atraumatic.     Right Ear: External ear normal.     Left Ear: External ear normal.     Mouth/Throat:     Pharynx: No oropharyngeal exudate or posterior oropharyngeal erythema.   Eyes:     General: No scleral icterus.       Right eye: No discharge.        Left eye: No discharge.     Conjunctiva/sclera: Conjunctivae normal.   Neck:     Thyroid : No thyromegaly.   Cardiovascular:     Rate and Rhythm: Normal rate and regular rhythm.  Pulmonary:     Effort: No respiratory distress.     Breath sounds: Normal breath sounds. No wheezing.  Abdominal:     General: Bowel sounds are normal.     Palpations: Abdomen is soft.     Tenderness: There is  no abdominal tenderness.   Musculoskeletal:        General: No swelling or tenderness.     Cervical back: Neck supple. No tenderness.  Lymphadenopathy:     Cervical: No cervical adenopathy.   Skin:    Findings: No erythema or rash.   Neurological:     Mental Status: She is  alert.   Psychiatric:        Mood and Affect: Mood normal.        Behavior: Behavior normal.         Outpatient Encounter Medications as of 07/18/2023  Medication Sig   acetaminophen  (TYLENOL ) 325 MG tablet Take 650 mg by mouth every 6 (six) hours as needed.   albuterol  (VENTOLIN  HFA) 108 (90 Base) MCG/ACT inhaler Inhale 2 puffs into the lungs every 6 (six) hours as needed for wheezing or shortness of breath.   aspirin  81 MG EC tablet Take 81 mg by mouth daily. Swallow whole.   Cholecalciferol  (VITAMIN D -3) 1000 UNITS CAPS Take 2,000 Units by mouth daily.   ezetimibe  (ZETIA ) 10 MG tablet Take 1 tablet (10 mg total) by mouth daily.   fluticasone  (FLONASE ) 50 MCG/ACT nasal spray Place 1 spray into both nostrils daily.   fluticasone  furoate-vilanterol (BREO ELLIPTA ) 100-25 MCG/ACT AEPB Inhale 1 puff into the lungs daily.   furosemide  (LASIX ) 40 MG tablet Take 1/2-1 tablet as directed per weight gain   isosorbide  mononitrate (IMDUR ) 30 MG 24 hr tablet Take 1 tablet (30 mg total) by mouth daily.   metoprolol  succinate (TOPROL -XL) 25 MG 24 hr tablet Take 1 tablet (25 mg total) by mouth daily.   sacubitril -valsartan  (ENTRESTO ) 49-51 MG Take 1 tablet by mouth 2 (two) times daily.   senna-docusate (SENOKOT-S) 8.6-50 MG tablet Take 2 tablets by mouth 2 (two) times daily as needed for mild constipation.   spironolactone  (ALDACTONE ) 25 MG tablet Take 1 tablet (25 mg total) by mouth daily.   No facility-administered encounter medications on file as of 07/18/2023.     Lab Results  Component Value Date   WBC 3.8 (L) 11/04/2022   HGB 13.1 11/04/2022   HCT 40.9 11/04/2022   PLT 247.0 11/04/2022   GLUCOSE 101 (H)  07/14/2023   CHOL 196 07/14/2023   TRIG 60.0 07/14/2023   HDL 79.70 07/14/2023   LDLDIRECT 128.3 02/08/2013   LDLCALC 104 (H) 07/14/2023   ALT 9 07/14/2023   AST 17 07/14/2023   NA 141 07/14/2023   K 4.4 07/14/2023   CL 101 07/14/2023   CREATININE 1.10 07/14/2023   BUN 19 07/14/2023   CO2 34 (H) 07/14/2023   TSH 0.61 06/28/2022   INR 0.9 06/28/2019   HGBA1C 6.2 07/14/2023    MM 3D SCREENING MAMMOGRAM BILATERAL BREAST Result Date: 12/10/2022 CLINICAL DATA:  Screening. EXAM: DIGITAL SCREENING BILATERAL MAMMOGRAM WITH TOMOSYNTHESIS AND CAD TECHNIQUE: Bilateral screening digital craniocaudal and mediolateral oblique mammograms were obtained. Bilateral screening digital breast tomosynthesis was performed. The images were evaluated with computer-aided detection. COMPARISON:  Previous exam(s). ACR Breast Density Category c: The breasts are heterogeneously dense, which may obscure small masses. FINDINGS: There are no findings suspicious for malignancy. IMPRESSION: No mammographic evidence of malignancy. A result letter of this screening mammogram will be mailed directly to the patient. RECOMMENDATION: Screening mammogram in one year as clinically indicated BI-RADS CATEGORY  1: Negative. Electronically Signed   By: Reyes Phi M.D.   On: 12/10/2022 08:49       Assessment & Plan:  Aortic atherosclerosis (HCC) Assessment & Plan: Statin intolerance. Continue zetia .    Hypercholesterolemia -     Lipid panel; Future -     Hepatic function panel; Future -     Basic metabolic panel with GFR; Future -     CBC with Differential/Platelet; Future -     TSH; Future  Hyperglycemia -  Hemoglobin A1c; Future  Chronic systolic heart failure (HCC) Assessment & Plan: Continue metoprolol , spironolactone , entrestro and imdur . Has lasix . Weight has been stable. Continue to weigh daily. No evidence of volume overload on exam. Entrestro recently decreased. Doing well.    Stage 3b chronic kidney  disease (HCC) Assessment & Plan: Recent GFR improved - 43. Follow.    Essential hypertension Assessment & Plan: Continue aldactone , entrestro, metoprolol  and has lasix . Pressure as outlined. No changes today. Recent decrease in entrestro. Doing well. Follow. Follow metabolic panel.    History of colon cancer Assessment & Plan: Being followed by oncology. PET ok. Follow.    Right knee pain, unspecified chronicity Assessment & Plan: Evaluated by ortho. S/p injection.  Did not feel help.  Discussed. Desires no further intevention or evaluation at this time.    NICM (nonischemic cardiomyopathy) (HCC) Assessment & Plan: Taking metoprolol , spironolactone , entrestro and imdur .  Has lasix . Weight has been stable.  No changes. Follow.       Allena Hamilton, MD

## 2023-07-18 NOTE — Assessment & Plan Note (Signed)
 Being followed by oncology. PET ok. Follow.

## 2023-07-18 NOTE — Assessment & Plan Note (Signed)
 Evaluated by ortho. S/p injection.  Did not feel help.  Discussed. Desires no further intevention or evaluation at this time.

## 2023-07-18 NOTE — Assessment & Plan Note (Signed)
 Recent GFR improved - 43. Follow.

## 2023-07-18 NOTE — Assessment & Plan Note (Signed)
 Continue aldactone , entrestro, metoprolol  and has lasix . Pressure as outlined. No changes today. Recent decrease in entrestro. Doing well. Follow. Follow metabolic panel.

## 2023-07-18 NOTE — Assessment & Plan Note (Signed)
 Statin intolerance. Continue zetia.

## 2023-07-18 NOTE — Assessment & Plan Note (Signed)
 Continue metoprolol , spironolactone , entrestro and imdur . Has lasix . Weight has been stable. Continue to weigh daily. No evidence of volume overload on exam. Entrestro recently decreased. Doing well.

## 2023-07-18 NOTE — Assessment & Plan Note (Signed)
 Taking metoprolol , spironolactone , entrestro and imdur .  Has lasix . Weight has been stable.  No changes. Follow.

## 2023-07-25 ENCOUNTER — Encounter: Payer: Self-pay | Admitting: Nurse Practitioner

## 2023-07-25 ENCOUNTER — Ambulatory Visit: Admitting: Nurse Practitioner

## 2023-07-25 VITALS — BP 106/62 | HR 70 | Temp 97.1°F | Ht 62.0 in | Wt 126.0 lb

## 2023-07-25 DIAGNOSIS — J449 Chronic obstructive pulmonary disease, unspecified: Secondary | ICD-10-CM

## 2023-07-25 DIAGNOSIS — I5022 Chronic systolic (congestive) heart failure: Secondary | ICD-10-CM | POA: Diagnosis not present

## 2023-07-25 DIAGNOSIS — J4489 Other specified chronic obstructive pulmonary disease: Secondary | ICD-10-CM | POA: Diagnosis not present

## 2023-07-25 NOTE — Assessment & Plan Note (Addendum)
 Moderate obstruction.  No longer on Breo.  Feels well-controlled.  No recent exacerbations or hospitalizations.  Minimal Saba use.  Okay to remain off of scheduled bronchodilator for the time being.  Advised her to notify of any worsening symptoms or exacerbations.  Does have some morning dyspnea.  Will obtain ONO on room air to rule out nocturnal hypoxemia.  Encouraged to continue with graded exercises and remain active.  Up-to-date on vaccines.  Action plan in place.  Patient Instructions  Continue Albuterol  inhaler 2 puffs every 6 hours as needed for shortness of breath or wheezing. Notify if symptoms persist despite rescue inhaler/neb use.  Continue flonase  nasal spray 2 sprays each nostril daily  Overnight oxygen  study ordered - someone should contact you in the next 3-4 weeks to schedule this. If you have not heard back from me 2 weeks after finishing the study, please call our office  Follow up in one year with Dr. Tamea or Izetta Malachy Dominguez. If symptoms worsen, please contact office for sooner follow up or seek emergency care.

## 2023-07-25 NOTE — Patient Instructions (Addendum)
 Continue Albuterol  inhaler 2 puffs every 6 hours as needed for shortness of breath or wheezing. Notify if symptoms persist despite rescue inhaler/neb use.  Continue flonase  nasal spray 2 sprays each nostril daily  Overnight oxygen  study ordered - someone should contact you in the next 3-4 weeks to schedule this. If you have not heard back from me 2 weeks after finishing the study, please call our office  Follow up in one year with Kathy Dominguez or Kathy Dominguez. If symptoms worsen, please contact office for sooner follow up or seek emergency care.

## 2023-07-25 NOTE — Assessment & Plan Note (Signed)
 Euvolemic on exam. Follow up with cardiology as scheduled

## 2023-07-25 NOTE — Progress Notes (Signed)
 @Patient  ID: Kathy Dominguez, female    DOB: 1928-04-18, 89 y.o.   MRN: [id number]  Chief Complaint  Patient presents with   Follow-up    SOB in the mornings (she is told). No wheezing or cough.     Referring provider: Glendia Shad, MD  HPI: 89 year old female, former smoker followed for COPD and asthma. She is a former patient of Dr. Magdaleno and last seen in office 08/04/2022. Past medical history significant for HTN, CHF, NSVT, nonischemic cardiomyopathy, CKD, HLD.   TEST/EVENTS:  11/23/2019 CT chest: 5 mm nodule right apex stable since 2008 09/15/2021 PFT: FVC 76, FEV1 73, ratio 55, TLC 126. Moderately severe obstructive disease without reversibility   09/24/2021: OV with Dr. Shellia. Breathing and cough are better. Not having sinus congestion. Ranell helps her. Not needing to use albuterol  much. Keeps up with activity.   08/04/2022: Ov with Pennye Beeghly NP for follow up. She has been doing well since she was here last. She has not had any flare ups requiring abx/steroids or hospitalizations. She does well from an activity standpoint. Lives by herself and is independent. Her breathing does not limit her activity. She feels like her Ranell helps. She denies any wheezing, cough, chest congestion. No concerns or complaints today. Uses albuterol  a few times a month.   07/25/2023: Today - follow up Patient presents today with her son.  Has been doing well since her last visit.  No exacerbations requiring steroids or antibiotics.  No issues with her breathing aside from some morning shortness of breath.  She uses her albuterol  1-2 times a week.  Is no longer using her Breo inhaler.  Feels like it did not make a huge difference for her.  No issues with cough or wheezing.  No steroids or antibiotics recently.  No Known Allergies  Immunization History  Administered Date(s) Administered   Fluad Quad(high Dose 65+) 12/06/2018   Fluad Trivalent(High Dose 65+) 10/06/2022   Influenza, High Dose Seasonal PF  12/04/2015, 11/23/2016, 11/21/2017, 10/21/2020   Influenza,inj,Quad PF,6+ Mos 02/20/2015   Influenza-Unspecified 10/20/2021   PFIZER(Purple Top)SARS-COV-2 Vaccination 01/31/2019, 02/21/2019, 10/21/2019, 11/04/2020   Pfizer(Comirnaty)Fall Seasonal Vaccine 12 years and older 11/04/2022   Pneumococcal Conjugate-13 12/19/2017   Pneumococcal-Unspecified 01/26/2003   Rsv, Bivalent, Protein Subunit Rsvpref,pf Marlow) 11/09/2021   Zoster Recombinant(Shingrix) 03/10/2018, 06/23/2018    Past Medical History:  Diagnosis Date   Asthma    Chronic systolic CHF (congestive heart failure) (HCC)    a. 01/2015 Echo: EF 25-30%, sev diff HK, mild to mod MR, mildly dil LA, nl RV fxn, PASP 61 mmHg.   Colon cancer (HCC) 2005   Hypercholesterolemia    Hypertension    Hypertensive heart disease with CHF (congestive heart failure) (HCC)    NICM (nonischemic cardiomyopathy) (HCC)    a. 01/2015 Echo: EF 25-30% sev diff HK;  b. 01/2015 Lexi MV: EF 19%, small defect of mild severity in apex - likely breast attenuation, no ischemia.   NSVT (nonsustained ventricular tachycardia) (HCC)    Osteoporosis     Tobacco History: Social History   Tobacco Use  Smoking Status Never  Smokeless Tobacco Never   Counseling given: Not Answered   Outpatient Medications Prior to Visit  Medication Sig Dispense Refill   acetaminophen  (TYLENOL ) 325 MG tablet Take 650 mg by mouth every 6 (six) hours as needed.     albuterol  (VENTOLIN  HFA) 108 (90 Base) MCG/ACT inhaler Inhale 2 puffs into the lungs every 6 (six) hours as needed  for wheezing or shortness of breath. 8.5 g 2   aspirin  81 MG EC tablet Take 81 mg by mouth daily. Swallow whole.     Cholecalciferol  (VITAMIN D -3) 1000 UNITS CAPS Take 2,000 Units by mouth daily.     ezetimibe  (ZETIA ) 10 MG tablet Take 1 tablet (10 mg total) by mouth daily. 90 tablet 1   furosemide  (LASIX ) 40 MG tablet Take 1/2-1 tablet as directed per weight gain 90 tablet 1   isosorbide  mononitrate  (IMDUR ) 30 MG 24 hr tablet Take 1 tablet (30 mg total) by mouth daily. 30 tablet 3   metoprolol  succinate (TOPROL -XL) 25 MG 24 hr tablet Take 1 tablet (25 mg total) by mouth daily. 90 tablet 3   sacubitril -valsartan  (ENTRESTO ) 49-51 MG Take 1 tablet by mouth 2 (two) times daily.     senna-docusate (SENOKOT-S) 8.6-50 MG tablet Take 2 tablets by mouth 2 (two) times daily as needed for mild constipation. 60 tablet 0   spironolactone  (ALDACTONE ) 25 MG tablet Take 1 tablet (25 mg total) by mouth daily. 90 tablet 0   fluticasone  (FLONASE ) 50 MCG/ACT nasal spray Place 1 spray into both nostrils daily. (Patient not taking: Reported on 07/25/2023) 16 g 2   fluticasone  furoate-vilanterol (BREO ELLIPTA ) 100-25 MCG/ACT AEPB Inhale 1 puff into the lungs daily. (Patient not taking: Reported on 07/25/2023) 60 each 5   No facility-administered medications prior to visit.     Review of Systems:   Constitutional: No weight loss or gain, night sweats, fevers, chills, fatigue, or lassitude. HEENT: No headaches, difficulty swallowing, tooth/dental problems, or sore throat. No sneezing, itching, ear ache, nasal congestion, or post nasal drip CV:  No chest pain, orthopnea, PND, swelling in lower extremities, anasarca, dizziness, palpitations, syncope Resp: +minimal shortness of breath with exertion. No excess mucus or change in color of mucus. No productive or non-productive. No hemoptysis. No wheezing.  No chest wall deformity GI:  No heartburn, indigestion Skin: No rash, lesions, ulcerations MSK:  No joint pain or swelling.   Neuro: No dizziness or lightheadedness.  Psych: No depression or anxiety. Mood stable.     Physical Exam:  BP 106/62 (BP Location: Right Arm, Cuff Size: Normal)   Pulse 70   Temp (!) 97.1 F (36.2 C)   Ht 5' 2 (1.575 m)   Wt 126 lb (57.2 kg)   LMP 02/21/1966   SpO2 94%   BMI 23.05 kg/m   GEN: Pleasant, interactive, well-kempt; appears younger than stated age; in no acute  distress. HEENT:  Normocephalic and atraumatic. PERRLA. Sclera white. Nasal turbinates pink, moist and patent bilaterally. No rhinorrhea present. Oropharynx pink and moist, without exudate or edema. No lesions, ulcerations, or postnasal drip.  NECK:  Supple w/ fair ROM. No lymphadenopathy.   CV: RRR, no m/r/g, no peripheral edema. Pulses intact, +2 bilaterally. No cyanosis, pallor or clubbing. PULMONARY:  Unlabored, regular breathing. Clear bilaterally A&P w/o wheezes/rales/rhonchi. No accessory muscle use.  GI: BS present and normoactive. Soft, non-tender to palpation. No organomegaly or masses detected.  MSK: No erythema, warmth or tenderness. Cap refil <2 sec all extrem. No deformities or joint swelling noted.  Neuro: A/Ox3. No focal deficits noted.   Skin: Warm, no lesions or rashe Psych: Normal affect and behavior. Judgement and thought content appropriate.     Lab Results:  CBC    Component Value Date/Time   WBC 3.8 (L) 11/04/2022 0930   RBC 4.41 11/04/2022 0930   HGB 13.1 11/04/2022 0930   HGB 13.5  04/17/2013 1055   HCT 40.9 11/04/2022 0930   HCT 41.8 04/17/2013 1055   PLT 247.0 11/04/2022 0930   PLT 217 04/17/2013 1055   MCV 92.8 11/04/2022 0930   MCV 91 04/17/2013 1055   MCH 30.0 11/23/2019 1459   MCHC 31.9 11/04/2022 0930   RDW 14.5 11/04/2022 0930   RDW 13.7 04/17/2013 1055   LYMPHSABS 1.1 11/04/2022 0930   LYMPHSABS 1.4 04/17/2013 1055   MONOABS 0.4 11/04/2022 0930   MONOABS 0.5 04/17/2013 1055   EOSABS 0.0 11/04/2022 0930   EOSABS 0.0 04/17/2013 1055   BASOSABS 0.0 11/04/2022 0930   BASOSABS 0.0 04/17/2013 1055    BMET    Component Value Date/Time   NA 141 07/14/2023 0840   NA 144 04/17/2021 1117   NA 141 04/17/2013 1055   K 4.4 07/14/2023 0840   K 4.0 04/17/2013 1055   CL 101 07/14/2023 0840   CL 102 04/17/2013 1055   CO2 34 (H) 07/14/2023 0840   CO2 34 (H) 04/17/2013 1055   GLUCOSE 101 (H) 07/14/2023 0840   GLUCOSE 90 04/17/2013 1055   BUN 19  07/14/2023 0840   BUN 27 04/17/2021 1117   BUN 15 04/17/2013 1055   CREATININE 1.10 07/14/2023 0840   CREATININE 0.67 07/25/2014 1706   CALCIUM  9.8 07/14/2023 0840   CALCIUM  9.2 04/17/2013 1055   GFRNONAA 39 (L) 02/21/2020 1512   GFRNONAA 52 (L) 11/23/2019 1459   GFRNONAA >60 04/17/2013 1055   GFRAA 45 (L) 02/21/2020 1512   GFRAA >60 04/17/2013 1055    BNP    Component Value Date/Time   BNP 98.9 11/23/2019 1459     Imaging:  No results found.  Administration History     None          Latest Ref Rng & Units 09/15/2021    3:53 PM  PFT Results  FVC-Pre L 1.43   FVC-Predicted Pre % 76   FVC-Post L 1.21   FVC-Predicted Post % 65   Pre FEV1/FVC % % 51   Post FEV1/FCV % % 55   FEV1-Pre L 0.72   FEV1-Predicted Pre % 53   FEV1-Post L 0.67   DLCO uncorrected ml/min/mmHg 3.74   TLC L 6.03   TLC % Predicted % 126   RV % Predicted % 188     No results found for: NITRICOXIDE      Assessment & Plan:   COPD with asthma Moderate obstruction.  No longer on Breo.  Feels well-controlled.  No recent exacerbations or hospitalizations.  Minimal Saba use.  Okay to remain off of scheduled bronchodilator for the time being.  Advised her to notify of any worsening symptoms or exacerbations.  Does have some morning dyspnea.  Will obtain ONO on room air to rule out nocturnal hypoxemia.  Encouraged to continue with graded exercises and remain active.  Up-to-date on vaccines.  Action plan in place.  Patient Instructions  Continue Albuterol  inhaler 2 puffs every 6 hours as needed for shortness of breath or wheezing. Notify if symptoms persist despite rescue inhaler/neb use.  Continue flonase  nasal spray 2 sprays each nostril daily  Overnight oxygen  study ordered - someone should contact you in the next 3-4 weeks to schedule this. If you have not heard back from me 2 weeks after finishing the study, please call our office  Follow up in one year with Dr. Tamea or Izetta Malachy PIETY.  If symptoms worsen, please contact office for sooner follow up or seek emergency care.  Chronic systolic heart failure (HCC) Euvolemic on exam.  Follow-up with cardiology as scheduled.  I spent 31 minutes of dedicated to the care of this patient on the date of this encounter to include pre-visit review of records, face-to-face time with the patient discussing conditions above, post visit ordering of testing, clinical documentation with the electronic health record, making appropriate referrals as documented, and communicating necessary findings to members of the patients care team.  Comer LULLA Rouleau, NP 07/25/2023  Pt aware and understands NP's role.

## 2023-08-03 ENCOUNTER — Other Ambulatory Visit: Payer: Self-pay | Admitting: Internal Medicine

## 2023-08-12 ENCOUNTER — Telehealth: Payer: Self-pay | Admitting: Nurse Practitioner

## 2023-08-22 ENCOUNTER — Other Ambulatory Visit: Payer: Self-pay | Admitting: Cardiovascular Disease

## 2023-08-22 ENCOUNTER — Encounter: Payer: Self-pay | Admitting: Nurse Practitioner

## 2023-08-26 ENCOUNTER — Other Ambulatory Visit: Payer: Self-pay

## 2023-08-26 NOTE — Telephone Encounter (Signed)
 Called patient regarding ONO result. Did not answer Lvmtcb.

## 2023-08-30 ENCOUNTER — Other Ambulatory Visit: Payer: Self-pay

## 2023-08-30 DIAGNOSIS — J4489 Other specified chronic obstructive pulmonary disease: Secondary | ICD-10-CM

## 2023-08-30 MED ORDER — ALBUTEROL SULFATE HFA 108 (90 BASE) MCG/ACT IN AERS: 2.0000 | INHALATION_SPRAY | Freq: Four times a day (QID) | RESPIRATORY_TRACT | 2 refills | Status: AC | PRN

## 2023-08-30 NOTE — Telephone Encounter (Signed)
 Relayed message of ONO result, patient verbalized understanding. Order placed please sign

## 2023-08-30 NOTE — Progress Notes (Signed)
AMB 

## 2023-09-06 ENCOUNTER — Telehealth: Payer: Self-pay

## 2023-09-06 NOTE — Telephone Encounter (Addendum)
 Received CMN, signed and faxed. Received fax confirmation. nfn

## 2023-10-07 ENCOUNTER — Other Ambulatory Visit: Payer: Self-pay | Admitting: Cardiovascular Disease

## 2023-11-15 ENCOUNTER — Other Ambulatory Visit

## 2023-11-16 ENCOUNTER — Other Ambulatory Visit: Payer: Self-pay | Admitting: Internal Medicine

## 2023-11-16 ENCOUNTER — Other Ambulatory Visit (INDEPENDENT_AMBULATORY_CARE_PROVIDER_SITE_OTHER)

## 2023-11-16 DIAGNOSIS — R739 Hyperglycemia, unspecified: Secondary | ICD-10-CM | POA: Diagnosis not present

## 2023-11-16 DIAGNOSIS — E7800 Pure hypercholesterolemia, unspecified: Secondary | ICD-10-CM

## 2023-11-16 DIAGNOSIS — Z1231 Encounter for screening mammogram for malignant neoplasm of breast: Secondary | ICD-10-CM

## 2023-11-16 LAB — LIPID PANEL
Cholesterol: 192 mg/dL (ref 0–200)
HDL: 72 mg/dL (ref >39.00)
LDL Cholesterol: 106 mg/dL — ABNORMAL HIGH (ref 0–99)
NonHDL: 119.98
Total CHOL/HDL Ratio: 3
Triglycerides: 69 mg/dL (ref 0.0–149.0)
VLDL: 13.8 mg/dL (ref 0.0–40.0)

## 2023-11-16 LAB — HEPATIC FUNCTION PANEL
ALT: 12 U/L (ref 0–35)
AST: 17 U/L (ref 0–37)
Albumin: 4.5 g/dL (ref 3.5–5.2)
Alkaline Phosphatase: 59 U/L (ref 39–117)
Bilirubin, Direct: 0.1 mg/dL (ref 0.0–0.3)
Total Bilirubin: 0.5 mg/dL (ref 0.2–1.2)
Total Protein: 7 g/dL (ref 6.0–8.3)

## 2023-11-16 LAB — CBC WITH DIFFERENTIAL/PLATELET
Basophils Absolute: 0 K/uL (ref 0.0–0.1)
Basophils Relative: 0.3 % (ref 0.0–3.0)
Eosinophils Absolute: 0 K/uL (ref 0.0–0.7)
Eosinophils Relative: 0.2 % (ref 0.0–5.0)
HCT: 41.4 % (ref 36.0–46.0)
Hemoglobin: 13.4 g/dL (ref 12.0–15.0)
Lymphocytes Relative: 38.3 % (ref 12.0–46.0)
Lymphs Abs: 1.3 K/uL (ref 0.7–4.0)
MCHC: 32.3 g/dL (ref 30.0–36.0)
MCV: 91.1 fl (ref 78.0–100.0)
Monocytes Absolute: 0.3 K/uL (ref 0.1–1.0)
Monocytes Relative: 10.1 % (ref 3.0–12.0)
Neutro Abs: 1.7 K/uL (ref 1.4–7.7)
Neutrophils Relative %: 51.1 % (ref 43.0–77.0)
Platelets: 197 K/uL (ref 150.0–400.0)
RBC: 4.55 Mil/uL (ref 3.87–5.11)
RDW: 13.8 % (ref 11.5–15.5)
WBC: 3.4 K/uL — ABNORMAL LOW (ref 4.0–10.5)

## 2023-11-16 LAB — BASIC METABOLIC PANEL WITH GFR
BUN: 21 mg/dL (ref 6–23)
CO2: 33 meq/L — ABNORMAL HIGH (ref 19–32)
Calcium: 9.6 mg/dL (ref 8.4–10.5)
Chloride: 100 meq/L (ref 96–112)
Creatinine, Ser: 1.34 mg/dL — ABNORMAL HIGH (ref 0.40–1.20)
GFR: 34.14 mL/min — ABNORMAL LOW (ref >60.00)
Glucose, Bld: 115 mg/dL — ABNORMAL HIGH (ref 70–99)
Potassium: 4.6 meq/L (ref 3.5–5.1)
Sodium: 142 meq/L (ref 135–145)

## 2023-11-16 LAB — HEMOGLOBIN A1C: Hgb A1c MFr Bld: 6.3 % (ref 4.6–6.5)

## 2023-11-16 LAB — TSH: TSH: 0.64 u[IU]/mL (ref 0.35–5.50)

## 2023-11-17 ENCOUNTER — Telehealth: Payer: Self-pay

## 2023-11-17 ENCOUNTER — Encounter: Payer: Self-pay | Admitting: Internal Medicine

## 2023-11-17 ENCOUNTER — Ambulatory Visit: Admitting: Internal Medicine

## 2023-11-17 ENCOUNTER — Ambulatory Visit: Payer: Self-pay | Admitting: Internal Medicine

## 2023-11-17 VITALS — BP 120/72 | HR 80 | Temp 97.7°F | Ht 62.0 in | Wt 119.4 lb

## 2023-11-17 DIAGNOSIS — E7800 Pure hypercholesterolemia, unspecified: Secondary | ICD-10-CM | POA: Diagnosis not present

## 2023-11-17 DIAGNOSIS — Z0000 Encounter for general adult medical examination without abnormal findings: Secondary | ICD-10-CM

## 2023-11-17 DIAGNOSIS — R1310 Dysphagia, unspecified: Secondary | ICD-10-CM

## 2023-11-17 DIAGNOSIS — N1832 Chronic kidney disease, stage 3b: Secondary | ICD-10-CM

## 2023-11-17 DIAGNOSIS — R739 Hyperglycemia, unspecified: Secondary | ICD-10-CM

## 2023-11-17 DIAGNOSIS — I4729 Other ventricular tachycardia: Secondary | ICD-10-CM

## 2023-11-17 DIAGNOSIS — R079 Chest pain, unspecified: Secondary | ICD-10-CM | POA: Diagnosis not present

## 2023-11-17 DIAGNOSIS — I110 Hypertensive heart disease with heart failure: Secondary | ICD-10-CM | POA: Diagnosis not present

## 2023-11-17 DIAGNOSIS — Z0001 Encounter for general adult medical examination with abnormal findings: Secondary | ICD-10-CM

## 2023-11-17 DIAGNOSIS — I5042 Chronic combined systolic (congestive) and diastolic (congestive) heart failure: Secondary | ICD-10-CM

## 2023-11-17 DIAGNOSIS — R0989 Other specified symptoms and signs involving the circulatory and respiratory systems: Secondary | ICD-10-CM

## 2023-11-17 DIAGNOSIS — I428 Other cardiomyopathies: Secondary | ICD-10-CM

## 2023-11-17 DIAGNOSIS — I10 Essential (primary) hypertension: Secondary | ICD-10-CM

## 2023-11-17 DIAGNOSIS — I700 Atherosclerosis of aorta: Secondary | ICD-10-CM

## 2023-11-17 DIAGNOSIS — J4489 Other specified chronic obstructive pulmonary disease: Secondary | ICD-10-CM

## 2023-11-17 NOTE — Assessment & Plan Note (Signed)
 No evidence of volume overload on exam.  Continue entrestro, aldactone  and metoprolol .  Has lasix .

## 2023-11-17 NOTE — Patient Instructions (Signed)
 Pepcid  (famotidine ) - 20mg  - take one tablet 30 minutes before breakfast  Nasacort  nasal spray - 2 sprays each nostril one time per day. Do this in the evening.

## 2023-11-17 NOTE — Progress Notes (Signed)
 Subjective:    [name], female    DOB: 1928/10/23, 89 y.o.   MRN: [id number]  Patient here for  Chief Complaint  Patient presents with   Annual Exam    HPI Here for a physical exam. Had f/u with pulmonary 07/25/23. F/u COPD and asthma. Off breo. Minimal SABA use. Recommended to continue albuterol  prn. Continue flonase . Recommended overnight oxygen  study. Overnight oxygen  study 08/09/23 - recommended to start supplemental oxygen  2L at night. Dr Gaston - urge incontinence - has tried myrbetriq . Last seen - 05/16/23 - reported urge incontinence - not a significant change on gemtesa . Saw Dr Perla 06/14/23 - blood pressure low. Recommended to decrease entrestro to 49/51 bid. Continue metoprolol  and spironolactone . Reports noticing some hoarseness in am. Feels improves later in day. Runny nose. Has noticed more after using oxygen . Has noticed some intermittent chest discomfort. May occur 2-3x/week. May last up to 1 hour. Initially stated felt noticed more after eating. Does not appear to occur with increased activity.    Past Medical History:  Diagnosis Date   Asthma    Chronic systolic CHF (congestive heart failure) (HCC)    a. 01/2015 Echo: EF 25-30%, sev diff HK, mild to mod MR, mildly dil LA, nl RV fxn, PASP 61 mmHg.   Colon cancer (HCC) 2005   Hypercholesterolemia    Hypertension    Hypertensive heart disease with CHF (congestive heart failure) (HCC)    NICM (nonischemic cardiomyopathy) (HCC)    a. 01/2015 Echo: EF 25-30% sev diff HK;  b. 01/2015 Lexi MV: EF 19%, small defect of mild severity in apex - likely breast attenuation, no ischemia.   NSVT (nonsustained ventricular tachycardia) (HCC)    Osteoporosis    Past Surgical History:  Procedure Laterality Date   ABDOMINAL HYSTERECTOMY     BREAST BIOPSY Left 4/15`   BENIGN BREAST EPITHELIUM WITH NODULAR FIBROSIS.   INGUINAL HERNIA REPAIR Left 05/04/2016   Procedure: HERNIA REPAIR INGUINAL ADULT;  Surgeon:  Reyes LELON Cota, MD;  Location: [facility] ORS;  Service: General;  Laterality: Left;   THYROIDECTOMY, PARTIAL  1956   TUBAL LIGATION     Family History  Problem Relation Age of Onset   Stroke Mother    Colon cancer Father    Breast cancer Daughter 58   Breast cancer Cousin    Social History   Socioeconomic History   Marital status: Widowed    Spouse name: Not on file   Number of children: Not on file   Years of education: Not on file   Highest education level: Bachelor's degree (e.g., BA, AB, BS)  Occupational History   Not on file  Tobacco Use   Smoking status: Never   Smokeless tobacco: Never  Vaping Use   Vaping status: Never Used  Substance and Sexual Activity   Alcohol use: No    Alcohol/week: 0.0 standard drinks of alcohol   Drug use: No   Sexual activity: Not on file  Other Topics Concern   Not on file  Social History Narrative   Lives a lone   Social Drivers of Health   Financial Resource Strain: Low Risk  (11/16/2023)   Overall Financial Resource Strain (CARDIA)    Difficulty of Paying Living Expenses: Not hard at all  Food Insecurity: No Food Insecurity (11/16/2023)   Hunger Vital Sign    Worried About Running Out of Food in the Last Year: Never true    Ran Out of Food in the Last  Year: Never true  Transportation Needs: No Transportation Needs (11/16/2023)   PRAPARE - Administrator, Civil Service (Medical): No    Lack of Transportation (Non-Medical): No  Physical Activity: Inactive (11/16/2023)   Exercise Vital Sign    Days of Exercise per Week: 0 days    Minutes of Exercise per Session: Not on file  Stress: No Stress Concern Present (11/16/2023)   [employer] of Occupational Health - Occupational Stress Questionnaire    Feeling of Stress: Not at all  Social Connections: Moderately Isolated (11/16/2023)   Social Connection and Isolation Panel    Frequency of Communication with Friends and Family: More than three times a week     Frequency of Social Gatherings with Friends and Family: Once a week    Attends Religious Services: More than 4 times per year    Active Member of [employer] or Organizations: No    Attends Banker Meetings: Not on file    Marital Status: Widowed     Review of Systems  Constitutional:  Negative for appetite change and unexpected weight change.  HENT:  Negative for congestion, sinus pressure and sore throat.   Eyes:  Negative for pain and visual disturbance.  Respiratory:  Negative for cough and shortness of breath.   Cardiovascular:  Negative for palpitations and leg swelling.       Intermittent chest discomfort as outlined.   Gastrointestinal:  Negative for abdominal pain, diarrhea, nausea and vomiting.  Genitourinary:  Negative for difficulty urinating and dysuria.  Musculoskeletal:  Negative for joint swelling and myalgias.  Skin:  Negative for color change and rash.  Neurological:  Negative for dizziness and headaches.  Hematological:  Negative for adenopathy. Does not bruise/bleed easily.  Psychiatric/Behavioral:  Negative for agitation and dysphoric mood.        Objective:     BP 120/72   Pulse 80   Temp 97.7 F (36.5 C) (Oral)   Ht 5' 2 (1.575 m)   Wt 119 lb 6.4 oz (54.2 kg)   LMP 02/21/1966   SpO2 91%   BMI 21.84 kg/m  Wt Readings from Last 3 Encounters:  11/17/23 119 lb 6.4 oz (54.2 kg)  07/25/23 126 lb (57.2 kg)  07/18/23 125 lb 3.2 oz (56.8 kg)    Physical Exam Vitals reviewed.  Constitutional:      General: She is not in acute distress.    Appearance: Normal appearance.  HENT:     Head: Normocephalic and atraumatic.     Right Ear: External ear normal.     Left Ear: External ear normal.     Mouth/Throat:     Pharynx: No oropharyngeal exudate or posterior oropharyngeal erythema.  Eyes:     General: No scleral icterus.       Right eye: No discharge.        Left eye: No discharge.     Conjunctiva/sclera: Conjunctivae normal.  Neck:      Thyroid : No thyromegaly.  Cardiovascular:     Rate and Rhythm: Normal rate and regular rhythm.  Pulmonary:     Effort: No respiratory distress.     Breath sounds: Normal breath sounds. No wheezing.  Abdominal:     General: Bowel sounds are normal.     Palpations: Abdomen is soft.     Tenderness: There is no abdominal tenderness.  Musculoskeletal:        General: No swelling or tenderness.     Cervical back: Neck supple. No tenderness.  Lymphadenopathy:     Cervical: No cervical adenopathy.  Skin:    Findings: No erythema or rash.  Neurological:     Mental Status: She is alert.  Psychiatric:        Mood and Affect: Mood normal.        Behavior: Behavior normal.         Outpatient Encounter Medications as of 11/17/2023  Medication Sig   acetaminophen  (TYLENOL ) 325 MG tablet Take 650 mg by mouth every 6 (six) hours as needed.   albuterol  (VENTOLIN  HFA) 108 (90 Base) MCG/ACT inhaler Inhale 2 puffs into the lungs every 6 (six) hours as needed for wheezing or shortness of breath.   aspirin  81 MG EC tablet Take 81 mg by mouth daily. Swallow whole.   Cholecalciferol  (VITAMIN D -3) 1000 UNITS CAPS Take 2,000 Units by mouth daily.   ezetimibe  (ZETIA ) 10 MG tablet Take 1 tablet (10 mg total) by mouth daily.   furosemide  (LASIX ) 40 MG tablet Take 1/2-1 tablet as directed per weight gain   isosorbide  mononitrate (IMDUR ) 30 MG 24 hr tablet Take 1 tablet (30 mg total) by mouth daily.   metoprolol  succinate (TOPROL -XL) 25 MG 24 hr tablet Take 1 tablet (25 mg total) by mouth daily.   sacubitril -valsartan  (ENTRESTO ) 49-51 MG Take 1 tablet by mouth 2 (two) times daily.   senna-docusate (SENOKOT-S) 8.6-50 MG tablet Take 2 tablets by mouth 2 (two) times daily as needed for mild constipation.   spironolactone  (ALDACTONE ) 25 MG tablet Take 1 tablet (25 mg total) by mouth daily.   [DISCONTINUED] fluticasone  (FLONASE ) 50 MCG/ACT nasal spray Place 1 spray into both nostrils daily. (Patient not taking:  Reported on 07/25/2023)   [DISCONTINUED] fluticasone  furoate-vilanterol (BREO ELLIPTA ) 100-25 MCG/ACT AEPB Inhale 1 puff into the lungs daily. (Patient not taking: Reported on 07/25/2023)   No facility-administered encounter medications on file as of 11/17/2023.     Lab Results  Component Value Date   WBC 3.4 (L) 11/16/2023   HGB 13.4 11/16/2023   HCT 41.4 11/16/2023   PLT 197.0 11/16/2023   GLUCOSE 115 (H) 11/16/2023   CHOL 192 11/16/2023   TRIG 69.0 11/16/2023   HDL 72.00 11/16/2023   LDLDIRECT 128.3 02/08/2013   LDLCALC 106 (H) 11/16/2023   ALT 12 11/16/2023   AST 17 11/16/2023   NA 142 11/16/2023   K 4.6 11/16/2023   CL 100 11/16/2023   CREATININE 1.34 (H) 11/16/2023   BUN 21 11/16/2023   CO2 33 (H) 11/16/2023   TSH 0.64 11/16/2023   INR 0.9 06/28/2019   HGBA1C 6.3 11/16/2023    MM 3D SCREENING MAMMOGRAM BILATERAL BREAST Result Date: 12/10/2022 CLINICAL DATA:  Screening. EXAM: DIGITAL SCREENING BILATERAL MAMMOGRAM WITH TOMOSYNTHESIS AND CAD TECHNIQUE: Bilateral screening digital craniocaudal and mediolateral oblique mammograms were obtained. Bilateral screening digital breast tomosynthesis was performed. The images were evaluated with computer-aided detection. COMPARISON:  Previous exam(s). ACR Breast Density Category c: The breasts are heterogeneously dense, which may obscure small masses. FINDINGS: There are no findings suspicious for malignancy. IMPRESSION: No mammographic evidence of malignancy. A result letter of this screening mammogram will be mailed directly to the patient. RECOMMENDATION: Screening mammogram in one year as clinically indicated BI-RADS CATEGORY  1: Negative. Electronically Signed   By: Reyes Phi M.D.   On: 12/10/2022 08:49       Assessment & Plan:  Health care maintenance Assessment & Plan: Physical today 11/17/23.  S/p hysterectomy.  Mammogram 12/08/22 - Birads I. Mammogram scheduled for  12/13/23.  Colonoscopy 2014.  Saw oncology - PET - ok -  2021.    Hypercholesterolemia Assessment & Plan: Intolerance to statin (crestor ). On zetia . Follow lipid panel.   Lab Results  Component Value Date   CHOL 192 11/16/2023   HDL 72.00 11/16/2023   LDLCALC 106 (H) 11/16/2023   LDLDIRECT 128.3 02/08/2013   TRIG 69.0 11/16/2023   CHOLHDL 3 11/16/2023     Orders: -     CBC with Differential/Platelet; Future -     Hepatic function panel; Future -     Lipid panel; Future  Hyperglycemia Assessment & Plan: Follow met b and A1c.  Lab Results  Component Value Date   HGBA1C 6.3 11/16/2023     Orders: -     Basic metabolic panel with GFR; Future -     Hemoglobin A1c; Future  Hypertensive heart disease with chronic combined systolic and diastolic congestive heart failure (HCC) Assessment & Plan: No evidence of volume overload on exam.  Continue entrestro, aldactone  and metoprolol .  Has lasix .   Orders: -     Basic metabolic panel with GFR; Future  Chest pain, unspecified type Assessment & Plan: Chest pain as outlined.  Was initially questioning if aggravated by eating. Does not appear to occur with increased activity. Start pepcid . Discussed swallowing. Some issues with feeling food not going down well. Discussed swallowing evaluation. EKG today - TWI II, III. Discussed follow up with cardiology with question of need for further cardiac w/up.   Orders: -     EKG 12-Lead  NSVT (nonsustained ventricular tachycardia) (HCC) Assessment & Plan: Documented per cardiology. Continue metoprolol . Stable.    NICM (nonischemic cardiomyopathy) (HCC) Assessment & Plan: Taking metoprolol , spironolactone , entrestro and imdur .  Has lasix . Weight down. Breathing stable. No evidence of volume overload. Follow.    Aortic atherosclerosis Assessment & Plan: Statin intolerance. Continue zetia .    Stage 3b chronic kidney disease (HCC) Assessment & Plan: Continue to avoid antiinflammatory medication. 11/16/23 GFR - 34. Overall stable. Follow.     COPD with asthma [facility]) Assessment & Plan: Had f/u with pulmonary 07/25/23. F/u COPD and asthma. Off breo. Minimal SABA use. Recommended to continue albuterol  prn. Continue flonase . Recommended overnight oxygen  study. Overnight oxygen  study 08/09/23 - recommended to start supplemental oxygen  2L at night.   Essential hypertension Assessment & Plan: Continue aldactone , entrestro, metoprolol  and has lasix . Pressure as outlined. No changes today. Recent decrease in entrestro. Doing well. Follow. Follow metabolic panel.    Runny nose Assessment & Plan: Nasacort  nasal spray as directed.    Dysphagia, unspecified type Assessment & Plan: Schedule for swallowing evaluation. Pepcid . Eat slowly. Chew food well.       Allena Hamilton, MD

## 2023-11-17 NOTE — Assessment & Plan Note (Signed)
 Intolerance to statin (crestor ). On zetia . Follow lipid panel.   Lab Results  Component Value Date   CHOL 192 11/16/2023   HDL 72.00 11/16/2023   LDLCALC 106 (H) 11/16/2023   LDLDIRECT 128.3 02/08/2013   TRIG 69.0 11/16/2023   CHOLHDL 3 11/16/2023

## 2023-11-17 NOTE — Telephone Encounter (Signed)
 Patient saw Dr. Allena Hamilton today and Dr. Hamilton would like to see her again in six weeks.  Patient states she doesn't really want early morning.  I scheduled an appointment for patient on 01/16/2024.  I let patient know that I would send a message to Dr. Hamilton so she will know how far out we scheduled her appointment.

## 2023-11-17 NOTE — Assessment & Plan Note (Addendum)
 Physical today 11/17/23.  S/p hysterectomy.  Mammogram 12/08/22 - Birads I. Mammogram scheduled for 12/13/23.  Colonoscopy 2014.  Saw oncology - PET - ok - 2021.

## 2023-11-17 NOTE — Assessment & Plan Note (Signed)
 Follow met b and A1c.  Lab Results  Component Value Date   HGBA1C 6.3 11/16/2023

## 2023-11-18 ENCOUNTER — Ambulatory Visit: Payer: Self-pay | Admitting: Internal Medicine

## 2023-11-21 ENCOUNTER — Encounter: Payer: Self-pay | Admitting: Internal Medicine

## 2023-11-21 DIAGNOSIS — R0989 Other specified symptoms and signs involving the circulatory and respiratory systems: Secondary | ICD-10-CM | POA: Insufficient documentation

## 2023-11-21 DIAGNOSIS — R079 Chest pain, unspecified: Secondary | ICD-10-CM | POA: Insufficient documentation

## 2023-11-21 DIAGNOSIS — R1310 Dysphagia, unspecified: Secondary | ICD-10-CM | POA: Insufficient documentation

## 2023-11-21 NOTE — Assessment & Plan Note (Signed)
 Documented per cardiology. Continue metoprolol. Stable.

## 2023-11-21 NOTE — Assessment & Plan Note (Signed)
 Statin intolerance. Continue zetia.

## 2023-11-21 NOTE — Assessment & Plan Note (Signed)
 Continue to avoid antiinflammatory medication. 11/16/23 GFR - 34. Overall stable. Follow.

## 2023-11-21 NOTE — Assessment & Plan Note (Signed)
 Continue aldactone , entrestro, metoprolol  and has lasix . Pressure as outlined. No changes today. Recent decrease in entrestro. Doing well. Follow. Follow metabolic panel.

## 2023-11-21 NOTE — Assessment & Plan Note (Signed)
 Schedule for swallowing evaluation. Pepcid . Eat slowly. Chew food well.

## 2023-11-21 NOTE — Assessment & Plan Note (Signed)
 Chest pain as outlined.  Was initially questioning if aggravated by eating. Does not appear to occur with increased activity. Start pepcid . Discussed swallowing. Some issues with feeling food not going down well. Discussed swallowing evaluation. EKG today - TWI II, III. Discussed follow up with cardiology with question of need for further cardiac w/up.

## 2023-11-21 NOTE — Assessment & Plan Note (Signed)
Nasacort nasal spray as directed

## 2023-11-21 NOTE — Assessment & Plan Note (Signed)
 Had f/u with pulmonary 07/25/23. F/u COPD and asthma. Off breo. Minimal SABA use. Recommended to continue albuterol  prn. Continue flonase . Recommended overnight oxygen  study. Overnight oxygen  study 08/09/23 - recommended to start supplemental oxygen  2L at night.

## 2023-11-21 NOTE — Assessment & Plan Note (Signed)
 Taking metoprolol , spironolactone , entrestro and imdur .  Has lasix . Weight down. Breathing stable. No evidence of volume overload. Follow.

## 2023-12-06 ENCOUNTER — Ambulatory Visit: Payer: Medicare HMO | Admitting: *Deleted

## 2023-12-06 VITALS — Ht 62.0 in | Wt 119.1 lb

## 2023-12-06 DIAGNOSIS — Z0000 Encounter for general adult medical examination without abnormal findings: Secondary | ICD-10-CM | POA: Diagnosis not present

## 2023-12-06 NOTE — Patient Instructions (Signed)
 Kathy Dominguez,  Thank you for taking the time for your Medicare Wellness Visit. I appreciate your continued commitment to your health goals. Please review the care plan we discussed, and feel free to reach out if I can assist you further.  Please note that Annual Wellness Visits do not include a physical exam. Some assessments may be limited, especially if the visit was conducted virtually. If needed, we may recommend an in-person follow-up with your provider.  Ongoing Care Seeing your primary care provider every 3 to 6 months helps us  monitor your health and provide consistent, personalized care.  Remember to update your coivd and Tetanus (Tdap) vaccines at your pharmacy.  Referrals If a referral was made during today's visit and you haven't received any updates within two weeks, please contact the referred provider directly to check on the status.  Recommended Screenings:  Health Maintenance  Topic Date Due   DTaP/Tdap/Td vaccine (1 - Tdap) Never done   COVID-19 Vaccine (6 - 2025-26 season) 09/26/2023   Breast Cancer Screening  12/08/2023   Pneumococcal Vaccine for age over 71 (2 of 2 - PPSV23, PCV20, or PCV21) 03/17/2024*   Medicare Annual Wellness Visit  12/05/2024   Flu Shot  Completed   DEXA scan (bone density measurement)  Completed   Zoster (Shingles) Vaccine  Completed   Meningitis B Vaccine  Aged Out  *Topic was postponed. The date shown is not the original due date.       12/06/2023    9:03 AM  Advanced Directives  Does Patient Have a Medical Advance Directive? No  Would patient like information on creating a medical advance directive? No - Patient declined    Vision: Annual vision screenings are recommended for early detection of glaucoma, cataracts, and diabetic retinopathy. These exams can also reveal signs of chronic conditions such as diabetes and high blood pressure.  Dental: Annual dental screenings help detect early signs of oral cancer, gum disease, and other  conditions linked to overall health, including heart disease and diabetes.  Please see the attached documents for additional preventive care recommendations.

## 2023-12-06 NOTE — Progress Notes (Signed)
 Subjective:   Kathy Dominguez is a 89 y.o. female who presents for a Medicare Annual Wellness Visit.  Allergies (verified) Patient has no known allergies.   History: Past Medical History:  Diagnosis Date   Asthma    Chronic systolic CHF (congestive heart failure) (HCC)    a. 01/2015 Echo: EF 25-30%, sev diff HK, mild to mod MR, mildly dil LA, nl RV fxn, PASP 61 mmHg.   Colon cancer (HCC) 2005   Hypercholesterolemia    Hypertension    Hypertensive heart disease with CHF (congestive heart failure) (HCC)    NICM (nonischemic cardiomyopathy) (HCC)    a. 01/2015 Echo: EF 25-30% sev diff HK;  b. 01/2015 Lexi MV: EF 19%, small defect of mild severity in apex - likely breast attenuation, no ischemia.   NSVT (nonsustained ventricular tachycardia) (HCC)    Osteoporosis    Past Surgical History:  Procedure Laterality Date   ABDOMINAL HYSTERECTOMY     BREAST BIOPSY Left 4/15`   BENIGN BREAST EPITHELIUM WITH NODULAR FIBROSIS.   INGUINAL HERNIA REPAIR Left 05/04/2016   Procedure: HERNIA REPAIR INGUINAL ADULT;  Surgeon: Reyes LELON Cota, MD;  Location: [facility] ORS;  Service: General;  Laterality: Left;   THYROIDECTOMY, PARTIAL  1956   TUBAL LIGATION     Family History  Problem Relation Age of Onset   Stroke Mother    Colon cancer Father    Breast cancer Daughter 44   Breast cancer Cousin    Social History   Occupational History   Not on file  Tobacco Use   Smoking status: Never   Smokeless tobacco: Never  Vaping Use   Vaping status: Never Used  Substance and Sexual Activity   Alcohol use: No    Alcohol/week: 0.0 standard drinks of alcohol   Drug use: No   Sexual activity: Not on file   Tobacco Counseling Counseling given: Not Answered  SDOH Screenings   Food Insecurity: No Food Insecurity (12/06/2023)  Housing: Low Risk  (12/06/2023)  Transportation Needs: No Transportation Needs (12/06/2023)  Utilities: Not At Risk (12/06/2023)  Alcohol Screen: Low Risk  (12/06/2023)   Depression (PHQ2-9): Low Risk  (12/06/2023)  Financial Resource Strain: Low Risk  (12/06/2023)  Physical Activity: Inactive (12/06/2023)  Social Connections: Moderately Integrated (12/06/2023)  Recent Concern: Social Connections - Moderately Isolated (11/16/2023)  Stress: No Stress Concern Present (12/06/2023)  Tobacco Use: Low Risk  (12/06/2023)  Health Literacy: Adequate Health Literacy (12/06/2023)   Depression Screen    12/06/2023    9:12 AM 11/17/2023    9:12 AM 12/01/2022    9:07 AM 02/24/2022   10:46 AM 11/27/2021    9:03 AM 11/24/2021    9:01 AM 06/24/2021   11:31 AM  PHQ 2/9 Scores  PHQ - 2 Score 0 0 0 0 0 0 0  PHQ- 9 Score 1 4  1        Exception Documentation   Patient refusal         Data saved with a previous flowsheet row definition     Goals Addressed             This Visit's Progress    Patient Stated       Wants to exercise       Visit info / Clinical Intake: Medicare Wellness Visit Type:: Subsequent Annual Wellness Visit Persons participating in visit:: patient Medicare Wellness Visit Mode:: Telephone If telephone:: video declined Because this visit was a virtual/telehealth visit:: pt reported vitals If Telephone  or Video please confirm:: I connected with the patient using audio enabled telemedicine application and verified that I am speaking with the correct person using two identifiers; I discussed the limitations of evaluation and management by telemedicine; The patient expressed understanding and agreed to proceed Patient Location:: Home Provider Location:: Office/Clinic Information given by:: patient Interpreter Needed?: No Pre-visit prep was completed: yes AWV questionnaire completed by patient prior to visit?: no Living arrangements:: (!) lives alone Patient's Overall Health Status Rating: good Typical amount of pain: some Does pain affect daily life?: no Are you currently prescribed opioids?: no  Dietary Habits and Nutritional  Risks How many meals a day?: 2 Most meals are obtained by: preparing own meals In the last 2 weeks, have you had any of the following?: none Diabetic:: no  Functional Status Activities of Daily Living (to include ambulation/medication): Independent Ambulation: Independent with device- listed below Home Assistive Devices/Equipment: Cane Medication Administration: Independent Home Management: Independent Manage your own finances?: yes Primary transportation is: driving Concerns about vision?: no *vision screening is required for WTM* Concerns about hearing?: (!) yes Uses hearing aids?: (!) yes Hear whispered voice?: -- (televisit)  Fall Screening Falls in the past year?: 0 Number of falls in past year: 0 Was there an injury with Fall?: 0 Fall Risk Category Calculator: 0 Patient Fall Risk Level: Low Fall Risk  Fall Risk Patient at Risk for Falls Due to: No Fall Risks Fall risk Follow up: Falls evaluation completed; Falls prevention discussed  Home and Transportation Safety: All rugs have non-skid backing?: yes All stairs or steps have railings?: N/A, no stairs Grab bars in the bathtub or shower?: (!) no Have non-skid surface in bathtub or shower?: yes Good home lighting?: yes Regular seat belt use?: yes Hospital stays in the last year:: no  Cognitive Assessment Difficulty concentrating, remembering, or making decisions? : no Will 6CIT or Mini Cog be Completed: yes What year is it?: 0 points What month is it?: 0 points Give patient an address phrase to remember (5 components): [address] [place] About what time is it?: 0 points Count backwards from 20 to 1: 0 points Say the months of the year in reverse: 0 points Repeat the address phrase from earlier: 0 points 6 CIT Score: 0 points  Advance Directives (For Healthcare) Does Patient Have a Medical Advance Directive?: No Would patient like information on creating a medical advance directive?: No - Patient  declined  Reviewed/Updated  Reviewed/Updated: Reviewed All (Medical, Surgical, Family, Medications, Allergies, Care Teams, Patient Goals)        Objective:    Today's Vitals   12/06/23 0857  Weight: 119 lb 2 oz (54 kg)  Height: 5' 2 (1.575 m)   Body mass index is 21.79 kg/m.  Current Medications (verified) Outpatient Encounter Medications as of 12/06/2023  Medication Sig   acetaminophen  (TYLENOL ) 325 MG tablet Take 650 mg by mouth every 6 (six) hours as needed.   albuterol  (VENTOLIN  HFA) 108 (90 Base) MCG/ACT inhaler Inhale 2 puffs into the lungs every 6 (six) hours as needed for wheezing or shortness of breath.   aspirin  81 MG EC tablet Take 81 mg by mouth daily. Swallow whole.   Cholecalciferol  (VITAMIN D -3) 1000 UNITS CAPS Take 2,000 Units by mouth daily.   ezetimibe  (ZETIA ) 10 MG tablet Take 1 tablet (10 mg total) by mouth daily.   famotidine  (PEPCID ) 20 MG tablet Take 20 mg by mouth daily.   furosemide  (LASIX ) 40 MG tablet Take 1/2-1 tablet  as directed per weight gain   isosorbide  mononitrate (IMDUR ) 30 MG 24 hr tablet Take 1 tablet (30 mg total) by mouth daily.   metoprolol  succinate (TOPROL -XL) 25 MG 24 hr tablet Take 1 tablet (25 mg total) by mouth daily.   sacubitril -valsartan  (ENTRESTO ) 49-51 MG Take 1 tablet by mouth 2 (two) times daily.   senna-docusate (SENOKOT-S) 8.6-50 MG tablet Take 2 tablets by mouth 2 (two) times daily as needed for mild constipation.   spironolactone  (ALDACTONE ) 25 MG tablet Take 1 tablet (25 mg total) by mouth daily.   No facility-administered encounter medications on file as of 12/06/2023.   Hearing/Vision screen Hearing Screening - Comments:: Wears aids Vision Screening - Comments:: Glasses Kathy Dominguez  Has an appointment 01/04/24 Immunizations and Health Maintenance Health Maintenance  Topic Date Due   DTaP/Tdap/Td (1 - Tdap) Never done   COVID-19 Vaccine (6 - 2025-26 season) 09/26/2023   Mammogram  12/08/2023   Pneumococcal  Vaccine: 50+ Years (2 of 2 - PPSV23, PCV20, or PCV21) 03/17/2024 (Originally 02/13/2018)   Medicare Annual Wellness (AWV)  12/05/2024   Influenza Vaccine  Completed   DEXA SCAN  Completed   Zoster Vaccines- Shingrix  Completed   Meningococcal B Vaccine  Aged Out        Assessment/Plan:  This is a routine wellness examination for [facility].  Patient Care Team: Glendia Shad, MD as PCP - General (Internal Medicine) Perla Evalene PARAS, MD as PCP - Cardiology (Cardiology) Glendia Shad, MD (Internal Medicine) Donette Ellouise LABOR, FNP as Nurse Practitioner (Family Medicine) Theotis Lavelle BRAVO, MD as Referring Physician (Pulmonary Disease) Perla Evalene PARAS, MD as Consulting Physician (Cardiology)  I have personally reviewed and noted the following in the patient's chart:   Medical and social history Use of alcohol, tobacco or illicit drugs  Current medications and supplements including opioid prescriptions. Functional ability and status Nutritional status Physical activity Advanced directives List of other physicians Hospitalizations, surgeries, and ER visits in previous 12 months Vitals Screenings to include cognitive, depression, and falls Referrals and appointments  No orders of the defined types were placed in this encounter.  In addition, I have reviewed and discussed with patient certain preventive protocols, quality metrics, and best practice recommendations. A written personalized care plan for preventive services as well as general preventive health recommendations were provided to patient.   Angeline Fredericks, LPN   [id number]/[id number]   Return in 1 year (on 12/05/2024).  After Visit Summary: (MyChart) Due to this being a telephonic visit, the after visit summary with patients personalized plan was offered to patient via MyChart   Nurse Notes: Discussed the need to update covid and tetanus (Tdap) vaccines.

## 2023-12-07 ENCOUNTER — Telehealth: Payer: Self-pay | Admitting: Internal Medicine

## 2023-12-07 NOTE — Telephone Encounter (Signed)
 Pt came in today and dropped off her disability parking placard to be filled out and has been placed in back mailbox. Thanks.

## 2023-12-07 NOTE — Telephone Encounter (Signed)
 Pt's son came to pick this up today

## 2023-12-07 NOTE — Telephone Encounter (Signed)
 Signed and placed in box.

## 2023-12-07 NOTE — Telephone Encounter (Signed)
 Pt informed form ready for pick up. Placed back up front

## 2023-12-13 ENCOUNTER — Ambulatory Visit
Admission: RE | Admit: 2023-12-13 | Discharge: 2023-12-13 | Disposition: A | Discharge status: Home / Self Care | Source: Ambulatory Visit | Attending: Internal Medicine | Admitting: Internal Medicine

## 2023-12-13 DIAGNOSIS — Z1231 Encounter for screening mammogram for malignant neoplasm of breast: Secondary | ICD-10-CM | POA: Diagnosis present

## 2023-12-15 ENCOUNTER — Other Ambulatory Visit: Payer: Self-pay | Admitting: Internal Medicine

## 2023-12-15 DIAGNOSIS — R928 Other abnormal and inconclusive findings on diagnostic imaging of breast: Secondary | ICD-10-CM

## 2023-12-16 ENCOUNTER — Ambulatory Visit: Payer: Self-pay | Admitting: Internal Medicine

## 2023-12-26 ENCOUNTER — Ambulatory Visit
Admission: RE | Admit: 2023-12-26 | Discharge: 2023-12-26 | Disposition: A | Source: Ambulatory Visit | Attending: Internal Medicine | Admitting: Internal Medicine

## 2023-12-26 DIAGNOSIS — R928 Other abnormal and inconclusive findings on diagnostic imaging of breast: Secondary | ICD-10-CM | POA: Diagnosis present

## 2023-12-27 ENCOUNTER — Ambulatory Visit: Payer: Self-pay | Admitting: Internal Medicine

## 2024-01-16 ENCOUNTER — Encounter: Payer: Self-pay | Admitting: Internal Medicine

## 2024-01-16 ENCOUNTER — Ambulatory Visit: Admitting: Internal Medicine

## 2024-01-16 VITALS — BP 100/64 | HR 72 | Temp 97.7°F | Ht 62.0 in | Wt 124.6 lb

## 2024-01-16 DIAGNOSIS — Z85038 Personal history of other malignant neoplasm of large intestine: Secondary | ICD-10-CM

## 2024-01-16 DIAGNOSIS — N1832 Chronic kidney disease, stage 3b: Secondary | ICD-10-CM | POA: Diagnosis not present

## 2024-01-16 DIAGNOSIS — R739 Hyperglycemia, unspecified: Secondary | ICD-10-CM

## 2024-01-16 DIAGNOSIS — R1310 Dysphagia, unspecified: Secondary | ICD-10-CM | POA: Diagnosis not present

## 2024-01-16 DIAGNOSIS — E7800 Pure hypercholesterolemia, unspecified: Secondary | ICD-10-CM | POA: Diagnosis not present

## 2024-01-16 DIAGNOSIS — R32 Unspecified urinary incontinence: Secondary | ICD-10-CM | POA: Diagnosis not present

## 2024-01-16 DIAGNOSIS — J4489 Other specified chronic obstructive pulmonary disease: Secondary | ICD-10-CM

## 2024-01-16 DIAGNOSIS — I10 Essential (primary) hypertension: Secondary | ICD-10-CM

## 2024-01-16 DIAGNOSIS — I428 Other cardiomyopathies: Secondary | ICD-10-CM | POA: Diagnosis not present

## 2024-01-16 DIAGNOSIS — I4729 Other ventricular tachycardia: Secondary | ICD-10-CM

## 2024-01-16 MED ORDER — EZETIMIBE 10 MG PO TABS: 10.0000 mg | ORAL_TABLET | Freq: Every day | ORAL | 1 refills | Status: AC

## 2024-01-16 NOTE — Progress Notes (Signed)
 "  Subjective:    [name], female    DOB: Jan 31, 1928, 89 y.o.   MRN: [id number]  Patient here for  Chief Complaint  Patient presents with   Medical Management of Chronic Issues    HPI Here for a scheduled follow up. Had f/u with pulmonary 07/25/23. F/u COPD and asthma. Off breo. Minimal SABA use. Recommended to continue albuterol  prn. Continue flonase . Recommended overnight oxygen  study. Overnight oxygen  study 08/09/23 - recommended to start supplemental oxygen  2L at night. Dr Gaston - urge incontinence - has tried myrbetriq . Last seen - 05/16/23 - reported urge incontinence - not a significant change on gemtesa . Saw Dr Gollan 06/14/23 - blood pressure low. Recommended to decrease entrestro to 49/51 bid. Continue metoprolol  and spironolactone . Last visit, reported some chest discomfort and swallowing issues. Was started on pepcid . No issues now. Breathing overall stable. No chest pain. No abdominal pain reported.    Past Medical History:  Diagnosis Date   Arthritis    Asthma    Chronic systolic CHF (congestive heart failure) (HCC)    a. 01/2015 Echo: EF 25-30%, sev diff HK, mild to mod MR, mildly dil LA, nl RV fxn, PASP 61 mmHg.   Colon cancer (HCC) 2005   Hypercholesterolemia    Hypertension    Hypertensive heart disease with CHF (congestive heart failure) (HCC)    NICM (nonischemic cardiomyopathy) (HCC)    a. 01/2015 Echo: EF 25-30% sev diff HK;  b. 01/2015 Lexi MV: EF 19%, small defect of mild severity in apex - likely breast attenuation, no ischemia.   NSVT (nonsustained ventricular tachycardia) (HCC)    Osteoporosis    Past Surgical History:  Procedure Laterality Date   ABDOMINAL HYSTERECTOMY     BREAST BIOPSY Left 4/15`   BENIGN BREAST EPITHELIUM WITH NODULAR FIBROSIS.   INGUINAL HERNIA REPAIR Left 05/04/2016   Procedure: HERNIA REPAIR INGUINAL ADULT;  Surgeon: Reyes LELON Cota, MD;  Location: [facility] ORS;  Service: General;  Laterality: Left;    THYROIDECTOMY, PARTIAL  1956   TUBAL LIGATION     Family History  Problem Relation Age of Onset   Stroke Mother    Colon cancer Father    Breast cancer Daughter 59   Breast cancer Cousin    Social History   Socioeconomic History   Marital status: Widowed    Spouse name: Not on file   Number of children: Not on file   Years of education: Not on file   Highest education level: Bachelor's degree (e.g., BA, AB, BS)  Occupational History   Not on file  Tobacco Use   Smoking status: Never   Smokeless tobacco: Never  Vaping Use   Vaping status: Never Used  Substance and Sexual Activity   Alcohol use: No    Alcohol/week: 0.0 standard drinks of alcohol   Drug use: No   Sexual activity: Not on file  Other Topics Concern   Not on file  Social History Narrative   Lives a lone   Social Drivers of Health   Tobacco Use: Low Risk (01/22/2024)   Patient History    Smoking Tobacco Use: Never    Smokeless Tobacco Use: Never    Passive Exposure: Not on file  Financial Resource Strain: Low Risk (12/06/2023)   Overall Financial Resource Strain (CARDIA)    Difficulty of Paying Living Expenses: Not hard at all  Food Insecurity: No Food Insecurity (12/06/2023)   Epic    Worried About Programme Researcher, Broadcasting/film/video in  the Last Year: Never true    Ran Out of Food in the Last Year: Never true  Transportation Needs: No Transportation Needs (12/06/2023)   Epic    Lack of Transportation (Medical): No    Lack of Transportation (Non-Medical): No  Physical Activity: Inactive (12/06/2023)   Exercise Vital Sign    Days of Exercise per Week: 0 days    Minutes of Exercise per Session: 0 min  Stress: No Stress Concern Present (12/06/2023)   [employer] of Occupational Health - Occupational Stress Questionnaire    Feeling of Stress: Not at all  Social Connections: Moderately Integrated (12/06/2023)   Social Connection and Isolation Panel    Frequency of Communication with Friends and Family: More  than three times a week    Frequency of Social Gatherings with Friends and Family: Three times a week    Attends Religious Services: More than 4 times per year    Active Member of Clubs or Organizations: Yes    Attends Banker Meetings: More than 4 times per year    Marital Status: Widowed  Recent Concern: Social Connections - Moderately Isolated (11/16/2023)   Social Connection and Isolation Panel    Frequency of Communication with Friends and Family: More than three times a week    Frequency of Social Gatherings with Friends and Family: Once a week    Attends Religious Services: More than 4 times per year    Active Member of [employer] or Organizations: No    Attends Banker Meetings: Not on file    Marital Status: Widowed  Depression (PHQ2-9): Low Risk (01/16/2024)   Depression (PHQ2-9)    PHQ-2 Score: 0  Alcohol Screen: Low Risk (12/06/2023)   Alcohol Screen    Last Alcohol Screening Score (AUDIT): 0  Housing: Low Risk (12/06/2023)   Epic    Unable to Pay for Housing in the Last Year: No    Number of Times Moved in the Last Year: 0    Homeless in the Last Year: No  Utilities: Not At Risk (12/06/2023)   Epic    Threatened with loss of utilities: No  Health Literacy: Adequate Health Literacy (12/06/2023)   B1300 Health Literacy    Frequency of need for help with medical instructions: Never     Review of Systems  Constitutional:  Negative for appetite change and unexpected weight change.  HENT:  Negative for congestion and sinus pressure.   Respiratory:  Negative for cough and chest tightness.        Breathing overall stable.   Cardiovascular:  Negative for chest pain and palpitations.       No increased leg swelling.   Gastrointestinal:  Negative for abdominal pain, diarrhea, nausea and vomiting.  Genitourinary:  Negative for difficulty urinating and dysuria.  Musculoskeletal:  Negative for joint swelling and myalgias.  Skin:  Negative for color  change and rash.  Neurological:  Negative for dizziness and headaches.  Psychiatric/Behavioral:  Negative for agitation and dysphoric mood.        Objective:     BP 100/64   Pulse 72   Temp 97.7 F (36.5 C) (Oral)   Ht 5' 2 (1.575 m)   Wt 124 lb 9.6 oz (56.5 kg)   LMP 02/21/1966   SpO2 98%   BMI 22.79 kg/m  Wt Readings from Last 3 Encounters:  01/16/24 124 lb 9.6 oz (56.5 kg)  12/06/23 119 lb 2 oz (54 kg)  11/17/23 119 lb 6.4 oz (  54.2 kg)   Blood pressure recheck 110/72.   Physical Exam Vitals reviewed.  Constitutional:      General: She is not in acute distress.    Appearance: Normal appearance.  HENT:     Head: Normocephalic and atraumatic.     Right Ear: External ear normal.     Left Ear: External ear normal.     Mouth/Throat:     Pharynx: No oropharyngeal exudate or posterior oropharyngeal erythema.  Eyes:     General: No scleral icterus.       Right eye: No discharge.        Left eye: No discharge.     Conjunctiva/sclera: Conjunctivae normal.  Neck:     Thyroid : No thyromegaly.  Cardiovascular:     Rate and Rhythm: Normal rate and regular rhythm.  Pulmonary:     Effort: No respiratory distress.     Breath sounds: Normal breath sounds. No wheezing.  Abdominal:     General: Bowel sounds are normal.     Palpations: Abdomen is soft.     Tenderness: There is no abdominal tenderness.  Musculoskeletal:        General: No swelling or tenderness.     Cervical back: Neck supple. No tenderness.  Lymphadenopathy:     Cervical: No cervical adenopathy.  Skin:    Findings: No erythema or rash.  Neurological:     Mental Status: She is alert.  Psychiatric:        Mood and Affect: Mood normal.        Behavior: Behavior normal.         Outpatient Encounter Medications as of 01/16/2024  Medication Sig   acetaminophen  (TYLENOL ) 325 MG tablet Take 650 mg by mouth every 6 (six) hours as needed.   albuterol  (VENTOLIN  HFA) 108 (90 Base) MCG/ACT inhaler Inhale 2  puffs into the lungs every 6 (six) hours as needed for wheezing or shortness of breath.   aspirin  81 MG EC tablet Take 81 mg by mouth daily. Swallow whole.   Cholecalciferol  (VITAMIN D -3) 1000 UNITS CAPS Take 2,000 Units by mouth daily.   ezetimibe  (ZETIA ) 10 MG tablet Take 1 tablet (10 mg total) by mouth daily.   famotidine  (PEPCID ) 20 MG tablet Take 20 mg by mouth daily.   furosemide  (LASIX ) 40 MG tablet Take 1/2-1 tablet as directed per weight gain   isosorbide  mononitrate (IMDUR ) 30 MG 24 hr tablet Take 1 tablet (30 mg total) by mouth daily.   metoprolol  succinate (TOPROL -XL) 25 MG 24 hr tablet Take 1 tablet (25 mg total) by mouth daily.   sacubitril -valsartan  (ENTRESTO ) 49-51 MG Take 1 tablet by mouth 2 (two) times daily.   senna-docusate (SENOKOT-S) 8.6-50 MG tablet Take 2 tablets by mouth 2 (two) times daily as needed for mild constipation.   spironolactone  (ALDACTONE ) 25 MG tablet Take 1 tablet (25 mg total) by mouth daily.   [DISCONTINUED] ezetimibe  (ZETIA ) 10 MG tablet Take 1 tablet (10 mg total) by mouth daily.   No facility-administered encounter medications on file as of 01/16/2024.     Lab Results  Component Value Date   WBC 3.4 (L) 11/16/2023   HGB 13.4 11/16/2023   HCT 41.4 11/16/2023   PLT 197.0 11/16/2023   GLUCOSE 115 (H) 11/16/2023   CHOL 192 11/16/2023   TRIG 69.0 11/16/2023   HDL 72.00 11/16/2023   LDLDIRECT 128.3 02/08/2013   LDLCALC 106 (H) 11/16/2023   ALT 12 11/16/2023   AST 17 11/16/2023   NA  142 11/16/2023   K 4.6 11/16/2023   CL 100 11/16/2023   CREATININE 1.34 (H) 11/16/2023   BUN 21 11/16/2023   CO2 33 (H) 11/16/2023   TSH 0.64 11/16/2023   INR 0.9 06/28/2019   HGBA1C 6.3 11/16/2023    MM 3D DIAGNOSTIC MAMMOGRAM UNILATERAL RIGHT BREAST Result Date: 12/26/2023 CLINICAL DATA:  89 year old female recalled from her screening mammogram for possible asymmetry in the right breast. EXAM: DIGITAL DIAGNOSTIC UNILATERAL RIGHT MAMMOGRAM WITH TOMOSYNTHESIS  AND CAD TECHNIQUE: Right digital diagnostic mammography and breast tomosynthesis was performed. The images were evaluated with computer-aided detection. COMPARISON:  Previous exam(s). ACR Breast Density Category b: There are scattered areas of fibroglandular density. FINDINGS: Additional imaging of the right breast was performed. No persistent mass, distortion or malignant type microcalcifications identified. The asymmetry in the right breast disperses on the additional images. IMPRESSION: No evidence of malignancy in the right breast. RECOMMENDATION: Bilateral screening mammogram in November of 2026 is recommended. I have discussed the findings and recommendations with the patient. If applicable, a reminder letter will be sent to the patient regarding the next appointment. BI-RADS CATEGORY  1: Negative. Electronically Signed   By: Dina  Arceo M.D.   On: 12/26/2023 10:20       Assessment & Plan:  Urinary incontinence, unspecified type Assessment & Plan: Saw Dr Gaston - urge incontinence - has tried myrbetriq . Last seen - 05/17/22 - after trial of gemtesa . Per note, symptoms improved. She reports unsure if could tell a big difference, but she reports the medication was costly. Had preferred not to continue.  Have discussed PFT.    Dysphagia, unspecified type Assessment & Plan: Doing better now. Not a significant issue for her. Pepcid .    NSVT (nonsustained ventricular tachycardia) (HCC) Assessment & Plan: Documented per cardiology. Continue metoprolol . Stable.    NICM (nonischemic cardiomyopathy) (HCC) Assessment & Plan: Taking metoprolol , spironolactone , entrestro and imdur . Recently decreased entrestro dose. Breathing stable. No evidence of volume overload on exam today.    Hyperglycemia Assessment & Plan: Follow met b and A1c.  Lab Results  Component Value Date   HGBA1C 6.3 11/16/2023      Hypercholesterolemia Assessment & Plan: Intolerance to statin (crestor ). On zetia .  Follow lipid panel.  Lab Results  Component Value Date   CHOL 192 11/16/2023   HDL 72.00 11/16/2023   LDLCALC 106 (H) 11/16/2023   LDLDIRECT 128.3 02/08/2013   TRIG 69.0 11/16/2023   CHOLHDL 3 11/16/2023      History of colon cancer Assessment & Plan: Has been followed by oncology. PET ok.    Essential hypertension Assessment & Plan: Continue aldactone , entrestro, metoprolol  and has lasix . Pressure as outlined. No changes today. Recent decrease in entrestro. Overall stable. Follow.    COPD with asthma [facility]) Assessment & Plan: Had f/u with pulmonary 07/25/23. F/u COPD and asthma. Off breo. Minimal SABA use. Recommended to continue albuterol  prn. Continue flonase . Recommended overnight oxygen  study. Overnight oxygen  study 08/09/23 - recommended to start supplemental oxygen  2L at night.    Stage 3b chronic kidney disease (HCC) Assessment & Plan: Continue to avoid antiinflammatory medication. 11/16/23 GFR - 34. Stable overall. Follow.    Other orders -     Ezetimibe ; Take 1 tablet (10 mg total) by mouth daily.  Dispense: 90 tablet; Refill: 1     Allena Hamilton, MD "

## 2024-01-16 NOTE — Patient Instructions (Signed)
Recommend prevnar 20

## 2024-01-22 ENCOUNTER — Encounter: Payer: Self-pay | Admitting: Internal Medicine

## 2024-01-22 NOTE — Assessment & Plan Note (Signed)
 Doing better now. Not a significant issue for her. Pepcid .

## 2024-01-22 NOTE — Assessment & Plan Note (Signed)
 Follow met b and A1c.  Lab Results  Component Value Date   HGBA1C 6.3 11/16/2023

## 2024-01-22 NOTE — Assessment & Plan Note (Signed)
 Had f/u with pulmonary 07/25/23. F/u COPD and asthma. Off breo. Minimal SABA use. Recommended to continue albuterol  prn. Continue flonase . Recommended overnight oxygen  study. Overnight oxygen  study 08/09/23 - recommended to start supplemental oxygen  2L at night.

## 2024-01-22 NOTE — Assessment & Plan Note (Signed)
 Saw Dr Gaston - urge incontinence - has tried myrbetriq . Last seen - 05/17/22 - after trial of gemtesa . Per note, symptoms improved. She reports unsure if could tell a big difference, but she reports the medication was costly. Had preferred not to continue.  Have discussed PFT.

## 2024-01-22 NOTE — Assessment & Plan Note (Signed)
 Has been followed by oncology. PET ok.

## 2024-01-22 NOTE — Assessment & Plan Note (Signed)
 Continue to avoid antiinflammatory medication. 11/16/23 GFR - 34. Stable overall. Follow.

## 2024-01-22 NOTE — Assessment & Plan Note (Signed)
 Taking metoprolol , spironolactone , entrestro and imdur . Recently decreased entrestro dose. Breathing stable. No evidence of volume overload on exam today.

## 2024-01-22 NOTE — Assessment & Plan Note (Signed)
 Intolerance to statin (crestor ). On zetia . Follow lipid panel.   Lab Results  Component Value Date   CHOL 192 11/16/2023   HDL 72.00 11/16/2023   LDLCALC 106 (H) 11/16/2023   LDLDIRECT 128.3 02/08/2013   TRIG 69.0 11/16/2023   CHOLHDL 3 11/16/2023

## 2024-01-22 NOTE — Assessment & Plan Note (Signed)
 Continue aldactone , entrestro, metoprolol  and has lasix . Pressure as outlined. No changes today. Recent decrease in entrestro. Overall stable. Follow.

## 2024-01-22 NOTE — Assessment & Plan Note (Signed)
 Documented per cardiology. Continue metoprolol. Stable.

## 2024-03-19 ENCOUNTER — Other Ambulatory Visit

## 2024-05-16 ENCOUNTER — Ambulatory Visit: Admitting: Internal Medicine

## 2024-12-10 ENCOUNTER — Ambulatory Visit
